# Patient Record
Sex: Female | Born: 1935 | Race: White | Hispanic: No | Smoking: Never smoker
Health system: Southern US, Community
[De-identification: ages and names within clinical notes are randomized; demographics above are authoritative.]

## PROBLEM LIST (undated history)

## (undated) DIAGNOSIS — I358 Other nonrheumatic aortic valve disorders: Secondary | ICD-10-CM

## (undated) DIAGNOSIS — Z780 Asymptomatic menopausal state: Secondary | ICD-10-CM

## (undated) DIAGNOSIS — N3010 Interstitial cystitis (chronic) without hematuria: Secondary | ICD-10-CM

## (undated) DIAGNOSIS — G43109 Migraine with aura, not intractable, without status migrainosus: Secondary | ICD-10-CM

## (undated) DIAGNOSIS — M810 Age-related osteoporosis without current pathological fracture: Secondary | ICD-10-CM

## (undated) DIAGNOSIS — E785 Hyperlipidemia, unspecified: Secondary | ICD-10-CM

## (undated) DIAGNOSIS — K219 Gastro-esophageal reflux disease without esophagitis: Secondary | ICD-10-CM

## (undated) DIAGNOSIS — I10 Essential (primary) hypertension: Secondary | ICD-10-CM

## (undated) DIAGNOSIS — I739 Peripheral vascular disease, unspecified: Secondary | ICD-10-CM

## (undated) HISTORY — DX: Hyperlipidemia, unspecified: E78.5

## (undated) HISTORY — DX: Age-related osteoporosis without current pathological fracture: M81.0

## (undated) HISTORY — DX: Interstitial cystitis (chronic) without hematuria: N30.10

## (undated) HISTORY — DX: Other nonrheumatic aortic valve disorders: I35.8

## (undated) HISTORY — DX: Asymptomatic menopausal state: Z78.0

## (undated) HISTORY — DX: Essential (primary) hypertension: I10

---
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
## 1999-01-15 ENCOUNTER — Emergency Department (HOSPITAL_COMMUNITY): Admission: EM | Admit: 1999-01-15 | Discharge: 1999-01-15 | Payer: Self-pay | Admitting: Emergency Medicine

## 1999-08-17 ENCOUNTER — Inpatient Hospital Stay (HOSPITAL_COMMUNITY): Admission: EM | Admit: 1999-08-17 | Discharge: 1999-08-18 | Payer: Self-pay | Admitting: Emergency Medicine

## 1999-08-17 ENCOUNTER — Encounter: Payer: Self-pay | Admitting: Internal Medicine

## 1999-08-17 ENCOUNTER — Encounter: Payer: Self-pay | Admitting: Emergency Medicine

## 1999-08-21 ENCOUNTER — Ambulatory Visit (HOSPITAL_COMMUNITY): Admission: RE | Admit: 1999-08-21 | Discharge: 1999-08-21 | Payer: Self-pay | Admitting: Internal Medicine

## 1999-08-21 ENCOUNTER — Encounter: Payer: Self-pay | Admitting: Internal Medicine

## 2001-02-20 ENCOUNTER — Encounter: Payer: Self-pay | Admitting: Internal Medicine

## 2001-02-20 ENCOUNTER — Ambulatory Visit (HOSPITAL_COMMUNITY): Admission: RE | Admit: 2001-02-20 | Discharge: 2001-02-20 | Payer: Self-pay | Admitting: Internal Medicine

## 2004-11-23 ENCOUNTER — Ambulatory Visit: Payer: Self-pay | Admitting: Internal Medicine

## 2004-11-30 ENCOUNTER — Ambulatory Visit: Payer: Self-pay | Admitting: Internal Medicine

## 2005-01-08 ENCOUNTER — Ambulatory Visit: Payer: Self-pay | Admitting: Internal Medicine

## 2005-01-23 ENCOUNTER — Ambulatory Visit: Payer: Self-pay | Admitting: Internal Medicine

## 2005-02-27 ENCOUNTER — Ambulatory Visit: Payer: Self-pay | Admitting: Internal Medicine

## 2006-04-10 ENCOUNTER — Ambulatory Visit: Payer: Self-pay | Admitting: Internal Medicine

## 2006-04-16 ENCOUNTER — Ambulatory Visit: Payer: Self-pay | Admitting: Internal Medicine

## 2006-04-22 ENCOUNTER — Ambulatory Visit: Payer: Self-pay

## 2006-06-23 ENCOUNTER — Encounter: Payer: Self-pay | Admitting: Internal Medicine

## 2006-07-21 ENCOUNTER — Ambulatory Visit: Payer: Self-pay | Admitting: Internal Medicine

## 2006-12-18 ENCOUNTER — Ambulatory Visit: Payer: Self-pay | Admitting: Family Medicine

## 2007-04-15 ENCOUNTER — Ambulatory Visit: Payer: Self-pay | Admitting: Internal Medicine

## 2007-04-18 ENCOUNTER — Emergency Department (HOSPITAL_COMMUNITY): Admission: EM | Admit: 2007-04-18 | Discharge: 2007-04-18 | Payer: Self-pay | Admitting: Emergency Medicine

## 2007-06-15 ENCOUNTER — Ambulatory Visit: Payer: Self-pay | Admitting: Internal Medicine

## 2007-06-19 LAB — CONVERTED CEMR LAB
AST: 17 units/L (ref 0–37)
Creatinine, Ser: 0.5 mg/dL (ref 0.4–1.2)
Direct LDL: 115.5 mg/dL
Potassium: 4.5 meq/L (ref 3.5–5.1)
Triglycerides: 84 mg/dL (ref 0–149)

## 2007-06-22 ENCOUNTER — Ambulatory Visit: Payer: Self-pay | Admitting: Internal Medicine

## 2007-07-24 ENCOUNTER — Telehealth (INDEPENDENT_AMBULATORY_CARE_PROVIDER_SITE_OTHER): Payer: Self-pay | Admitting: *Deleted

## 2007-10-07 ENCOUNTER — Telehealth (INDEPENDENT_AMBULATORY_CARE_PROVIDER_SITE_OTHER): Payer: Self-pay | Admitting: *Deleted

## 2007-10-15 ENCOUNTER — Telehealth (INDEPENDENT_AMBULATORY_CARE_PROVIDER_SITE_OTHER): Payer: Self-pay | Admitting: *Deleted

## 2007-11-09 ENCOUNTER — Ambulatory Visit: Payer: Self-pay | Admitting: Internal Medicine

## 2008-01-19 ENCOUNTER — Telehealth (INDEPENDENT_AMBULATORY_CARE_PROVIDER_SITE_OTHER): Payer: Self-pay | Admitting: *Deleted

## 2008-03-11 ENCOUNTER — Telehealth (INDEPENDENT_AMBULATORY_CARE_PROVIDER_SITE_OTHER): Payer: Self-pay | Admitting: *Deleted

## 2008-04-18 ENCOUNTER — Telehealth (INDEPENDENT_AMBULATORY_CARE_PROVIDER_SITE_OTHER): Payer: Self-pay | Admitting: *Deleted

## 2008-05-02 ENCOUNTER — Telehealth (INDEPENDENT_AMBULATORY_CARE_PROVIDER_SITE_OTHER): Payer: Self-pay | Admitting: *Deleted

## 2008-08-04 ENCOUNTER — Telehealth (INDEPENDENT_AMBULATORY_CARE_PROVIDER_SITE_OTHER): Payer: Self-pay | Admitting: *Deleted

## 2008-11-01 ENCOUNTER — Telehealth (INDEPENDENT_AMBULATORY_CARE_PROVIDER_SITE_OTHER): Payer: Self-pay | Admitting: *Deleted

## 2008-11-15 ENCOUNTER — Telehealth (INDEPENDENT_AMBULATORY_CARE_PROVIDER_SITE_OTHER): Payer: Self-pay | Admitting: *Deleted

## 2008-11-23 ENCOUNTER — Ambulatory Visit: Payer: Self-pay | Admitting: Internal Medicine

## 2008-11-23 DIAGNOSIS — R9431 Abnormal electrocardiogram [ECG] [EKG]: Secondary | ICD-10-CM

## 2008-11-23 DIAGNOSIS — E785 Hyperlipidemia, unspecified: Secondary | ICD-10-CM | POA: Insufficient documentation

## 2008-11-23 DIAGNOSIS — M810 Age-related osteoporosis without current pathological fracture: Secondary | ICD-10-CM

## 2008-12-02 ENCOUNTER — Encounter (INDEPENDENT_AMBULATORY_CARE_PROVIDER_SITE_OTHER): Payer: Self-pay | Admitting: *Deleted

## 2008-12-12 ENCOUNTER — Encounter: Payer: Self-pay | Admitting: Internal Medicine

## 2009-01-24 ENCOUNTER — Ambulatory Visit: Payer: Self-pay | Admitting: Internal Medicine

## 2009-01-24 DIAGNOSIS — K219 Gastro-esophageal reflux disease without esophagitis: Secondary | ICD-10-CM | POA: Insufficient documentation

## 2009-01-25 ENCOUNTER — Encounter: Payer: Self-pay | Admitting: Internal Medicine

## 2009-01-25 ENCOUNTER — Telehealth (INDEPENDENT_AMBULATORY_CARE_PROVIDER_SITE_OTHER): Payer: Self-pay | Admitting: *Deleted

## 2009-03-22 ENCOUNTER — Ambulatory Visit: Payer: Self-pay | Admitting: Internal Medicine

## 2009-04-03 ENCOUNTER — Ambulatory Visit: Payer: Self-pay | Admitting: Internal Medicine

## 2009-04-04 ENCOUNTER — Encounter (INDEPENDENT_AMBULATORY_CARE_PROVIDER_SITE_OTHER): Payer: Self-pay | Admitting: *Deleted

## 2009-04-08 ENCOUNTER — Ambulatory Visit: Payer: Self-pay | Admitting: Internal Medicine

## 2009-04-11 ENCOUNTER — Ambulatory Visit: Payer: Self-pay | Admitting: Internal Medicine

## 2009-04-11 DIAGNOSIS — R1319 Other dysphagia: Secondary | ICD-10-CM

## 2009-04-11 LAB — CONVERTED CEMR LAB: Rapid Strep: NEGATIVE

## 2009-04-12 ENCOUNTER — Encounter: Payer: Self-pay | Admitting: Internal Medicine

## 2009-04-12 LAB — CONVERTED CEMR LAB
Basophils Absolute: 0 10*3/uL (ref 0.0–0.1)
Eosinophils Relative: 4.8 % (ref 0.0–5.0)
HCT: 39.2 % (ref 36.0–46.0)
Lymphocytes Relative: 19.2 % (ref 12.0–46.0)
Lymphs Abs: 1.4 10*3/uL (ref 0.7–4.0)
Monocytes Relative: 5.8 % (ref 3.0–12.0)
Neutrophils Relative %: 70.1 % (ref 43.0–77.0)
Platelets: 221 10*3/uL (ref 150.0–400.0)
RDW: 11.9 % (ref 11.5–14.6)
WBC: 7.4 10*3/uL (ref 4.5–10.5)

## 2009-04-13 ENCOUNTER — Encounter (INDEPENDENT_AMBULATORY_CARE_PROVIDER_SITE_OTHER): Payer: Self-pay | Admitting: *Deleted

## 2009-04-27 ENCOUNTER — Ambulatory Visit (HOSPITAL_COMMUNITY): Admission: RE | Admit: 2009-04-27 | Discharge: 2009-04-27 | Payer: Self-pay | Admitting: Obstetrics and Gynecology

## 2009-06-23 ENCOUNTER — Ambulatory Visit: Payer: Self-pay | Admitting: Internal Medicine

## 2009-06-23 LAB — CONVERTED CEMR LAB: Rapid Strep: NEGATIVE

## 2009-08-02 LAB — CONVERTED CEMR LAB: Vit D, 25-Hydroxy: 45 ng/mL (ref 30–89)

## 2009-10-27 ENCOUNTER — Encounter: Payer: Self-pay | Admitting: Internal Medicine

## 2009-12-01 ENCOUNTER — Ambulatory Visit: Payer: Self-pay | Admitting: Internal Medicine

## 2009-12-30 HISTORY — PX: COLONOSCOPY: SHX174

## 2010-01-25 ENCOUNTER — Ambulatory Visit: Payer: Self-pay | Admitting: Internal Medicine

## 2010-01-25 DIAGNOSIS — Z87448 Personal history of other diseases of urinary system: Secondary | ICD-10-CM

## 2010-01-25 LAB — CONVERTED CEMR LAB
Bilirubin Urine: NEGATIVE
Glucose, Urine, Semiquant: NEGATIVE
Ketones, urine, test strip: NEGATIVE
Specific Gravity, Urine: <1.005

## 2010-01-26 ENCOUNTER — Encounter: Payer: Self-pay | Admitting: Internal Medicine

## 2010-04-04 ENCOUNTER — Ambulatory Visit: Payer: Self-pay | Admitting: Internal Medicine

## 2010-04-04 DIAGNOSIS — J309 Allergic rhinitis, unspecified: Secondary | ICD-10-CM | POA: Insufficient documentation

## 2010-04-05 ENCOUNTER — Ambulatory Visit: Payer: Self-pay | Admitting: Diagnostic Radiology

## 2010-04-05 ENCOUNTER — Emergency Department (HOSPITAL_BASED_OUTPATIENT_CLINIC_OR_DEPARTMENT_OTHER): Admission: EM | Admit: 2010-04-05 | Discharge: 2010-04-05 | Payer: Self-pay | Admitting: Emergency Medicine

## 2010-04-05 ENCOUNTER — Telehealth: Payer: Self-pay | Admitting: Internal Medicine

## 2010-04-06 ENCOUNTER — Ambulatory Visit: Payer: Self-pay | Admitting: Internal Medicine

## 2010-04-06 DIAGNOSIS — I10 Essential (primary) hypertension: Secondary | ICD-10-CM

## 2010-04-10 ENCOUNTER — Ambulatory Visit: Payer: Self-pay | Admitting: Internal Medicine

## 2010-04-12 ENCOUNTER — Encounter (INDEPENDENT_AMBULATORY_CARE_PROVIDER_SITE_OTHER): Payer: Self-pay | Admitting: *Deleted

## 2010-04-16 LAB — CONVERTED CEMR LAB
BUN: 7 mg/dL (ref 6–23)
Creatinine, Ser: 0.5 mg/dL (ref 0.4–1.2)
Eosinophils Relative: 4.2 % (ref 0.0–5.0)
Lymphocytes Relative: 21.6 % (ref 12.0–46.0)
Monocytes Absolute: 0.4 10*3/uL (ref 0.1–1.0)
Monocytes Relative: 6.6 % (ref 3.0–12.0)
Neutrophils Relative %: 66.8 % (ref 43.0–77.0)
Platelets: 218 10*3/uL (ref 150.0–400.0)
Potassium: 5.1 meq/L (ref 3.5–5.1)
WBC: 5.9 10*3/uL (ref 4.5–10.5)

## 2010-04-17 ENCOUNTER — Ambulatory Visit: Payer: Self-pay | Admitting: Gastroenterology

## 2010-04-17 ENCOUNTER — Encounter (INDEPENDENT_AMBULATORY_CARE_PROVIDER_SITE_OTHER): Payer: Self-pay | Admitting: *Deleted

## 2010-04-17 DIAGNOSIS — K5909 Other constipation: Secondary | ICD-10-CM

## 2010-04-17 DIAGNOSIS — K5901 Slow transit constipation: Secondary | ICD-10-CM | POA: Insufficient documentation

## 2010-04-17 LAB — CONVERTED CEMR LAB
Ferritin: 50.5 ng/mL (ref 10.0–291.0)
Saturation Ratios: 21.7 % (ref 20.0–50.0)
Vitamin B-12: 714 pg/mL (ref 211–911)

## 2010-04-30 ENCOUNTER — Telehealth: Payer: Self-pay | Admitting: Gastroenterology

## 2010-05-07 ENCOUNTER — Telehealth: Payer: Self-pay | Admitting: Gastroenterology

## 2010-05-07 ENCOUNTER — Ambulatory Visit: Payer: Self-pay | Admitting: Gastroenterology

## 2010-05-10 ENCOUNTER — Telehealth: Payer: Self-pay | Admitting: Gastroenterology

## 2010-06-06 ENCOUNTER — Telehealth (INDEPENDENT_AMBULATORY_CARE_PROVIDER_SITE_OTHER): Payer: Self-pay | Admitting: *Deleted

## 2010-06-24 ENCOUNTER — Inpatient Hospital Stay (HOSPITAL_COMMUNITY): Admission: EM | Admit: 2010-06-24 | Discharge: 2010-06-25 | Payer: Self-pay | Admitting: Emergency Medicine

## 2010-06-24 ENCOUNTER — Ambulatory Visit: Payer: Self-pay | Admitting: Internal Medicine

## 2010-06-25 ENCOUNTER — Encounter: Payer: Self-pay | Admitting: Internal Medicine

## 2010-07-27 ENCOUNTER — Ambulatory Visit: Payer: Self-pay | Admitting: Internal Medicine

## 2010-07-27 DIAGNOSIS — I872 Venous insufficiency (chronic) (peripheral): Secondary | ICD-10-CM

## 2010-07-27 DIAGNOSIS — D649 Anemia, unspecified: Secondary | ICD-10-CM | POA: Insufficient documentation

## 2010-07-31 LAB — CONVERTED CEMR LAB
Basophils Absolute: 0 10*3/uL (ref 0.0–0.1)
HCT: 37.8 % (ref 36.0–46.0)
Hemoglobin: 13.1 g/dL (ref 12.0–15.0)
Lymphs Abs: 1.3 10*3/uL (ref 0.7–4.0)
Monocytes Relative: 6.3 % (ref 3.0–12.0)
Neutro Abs: 3.8 10*3/uL (ref 1.4–7.7)
RDW: 12.8 % (ref 11.5–14.6)
Saturation Ratios: 24.4 % (ref 20.0–50.0)
Transferrin: 237.5 mg/dL (ref 212.0–360.0)

## 2010-12-03 ENCOUNTER — Telehealth (INDEPENDENT_AMBULATORY_CARE_PROVIDER_SITE_OTHER): Payer: Self-pay | Admitting: *Deleted

## 2011-01-20 ENCOUNTER — Encounter: Payer: Self-pay | Admitting: Obstetrics and Gynecology

## 2011-01-27 LAB — CONVERTED CEMR LAB
ALT: 18 units/L (ref 0–35)
Albumin: 3.7 g/dL (ref 3.5–5.2)
Alkaline Phosphatase: 64 units/L (ref 39–117)
Alkaline Phosphatase: 65 units/L (ref 39–117)
BUN: 7 mg/dL (ref 6–23)
Basophils Absolute: 0 10*3/uL (ref 0.0–0.1)
Basophils Absolute: 0 10*3/uL (ref 0.0–0.1)
Bilirubin, Direct: 0.1 mg/dL (ref 0.0–0.3)
CO2: 32 meq/L (ref 19–32)
CO2: 32 meq/L (ref 19–32)
Calcium: 9.1 mg/dL (ref 8.4–10.5)
Chloride: 106 meq/L (ref 96–112)
Cholesterol: 204 mg/dL — ABNORMAL HIGH (ref 0–200)
Eosinophils Absolute: 0.1 10*3/uL (ref 0.0–0.7)
GFR calc non Af Amer: 127.13 mL/min (ref >60)
Glucose, Bld: 102 mg/dL — ABNORMAL HIGH (ref 70–99)
Glucose, Bld: 87 mg/dL (ref 70–99)
HDL: 70.1 mg/dL (ref >39.00)
Hemoglobin: 13 g/dL (ref 12.0–15.0)
Hemoglobin: 13.2 g/dL (ref 12.0–15.0)
Lymphocytes Relative: 23.3 % (ref 12.0–46.0)
Lymphocytes Relative: 23.4 % (ref 12.0–46.0)
Lymphs Abs: 1.1 10*3/uL (ref 0.7–4.0)
MCHC: 34.2 g/dL (ref 30.0–36.0)
MCHC: 34.4 g/dL (ref 30.0–36.0)
Monocytes Relative: 6.8 % (ref 3.0–12.0)
Neutro Abs: 3.1 10*3/uL (ref 1.4–7.7)
Neutro Abs: 3.3 10*3/uL (ref 1.4–7.7)
Neutrophils Relative %: 65.9 % (ref 43.0–77.0)
Platelets: 186 10*3/uL (ref 150.0–400.0)
Platelets: 209 10*3/uL (ref 150–400)
Potassium: 4.4 meq/L (ref 3.5–5.1)
RDW: 12 % (ref 11.5–14.6)
RDW: 12 % (ref 11.5–14.6)
Sodium: 140 meq/L (ref 135–145)
Sodium: 142 meq/L (ref 135–145)
TSH: 0.96 microintl units/mL (ref 0.35–5.50)
Total Bilirubin: 0.7 mg/dL (ref 0.3–1.2)
Total Bilirubin: 0.9 mg/dL (ref 0.3–1.2)
Total Protein: 6.2 g/dL (ref 6.0–8.3)
Triglycerides: 64 mg/dL (ref 0.0–149.0)
Vit D, 1,25-Dihydroxy: 31 (ref 30–89)
Vit D, 25-Hydroxy: 39 ng/mL (ref 30–89)

## 2011-01-29 NOTE — Progress Notes (Signed)
Summary: prep ?'s  Phone Note Call from Patient Call back at Home Phone [phone]   Caller: Patient Call For: Dr. Jarold Motto Reason for Call: Talk to Nurse Summary of Call: COL prep questions Initial call taken by: Vallarie Mare,  Apr 30, 2010 10:08 AM  Follow-up for Phone Call        Answered pt's questions re prep and colonoscopy. Follow-up by: Ashok Cordia RN,  Apr 30, 2010 10:22 AM

## 2011-01-29 NOTE — Progress Notes (Signed)
Summary: Phone-bp elevated//  FYI ED  Phone Note Call from Patient Call back at Home Phone [phone]   Caller: Patient Summary of Call: Patient states that her bp is 204/92. Patient is wantimg know what to do. She also states her face is flushed. She has an appt on today. Please advise Initial call taken by: Barb Merino,  April 05, 2010 12:36 PM  Follow-up for Phone Call        spoke with pt who says she has tingling in feet denies chst pain.  [place] fire [place] and had BP checked which was 204/92,  recommend [facility] to [facility] for [facility] assessment pt agreed. Cancelled appt .Kandice Hams  April 05, 2010 1:23 PM  Follow-up by: Marga Melnick MD,  April 05, 2010 6:24 PM  Additional Follow-up for Phone Call Additional follow up Details #1::        noted; BP 04/06 was in context of having thought she had lost her purse9she subsequently found it @ home) Additional Follow-up by: Marga Melnick MD,  April 05, 2010 6:25 PM

## 2011-01-29 NOTE — Assessment & Plan Note (Signed)
Summary: eyes itch and hurt/kdc   Vital Signs:  Patient profile:   77 year old female Weight:      138.4 pounds Temp:     98.0 degrees F oral Pulse rate:   76 / minute Resp:     14 per minute BP sitting:   150 / 84  (left arm) Cuff size:   regular  Vitals Entered By: Shonna Chock (April 04, 2010 3:10 PM) CC: Eyes very itchy and irritated x 1 week (better today). Patient tried Allegra and systaine eye drops. Comments REVIEWED MED LIST, PATIENT AGREED DOSE AND INSTRUCTION CORRECT    CC:  Eyes very itchy and irritated x 1 week (better today). Patient tried Allegra and systaine eye drops..  History of Present Illness: Itchy eyes X 5 days after pollen exposure @ outdoor birthday party. This is  recurrent every Spring. Allegra didn't help ; but she had some improvement today.  Allergies: 1)  ! Metronidazole 2)  ! Biaxin 3)  Penicillin G Potassium (Penicillin G Potassium) 4)  Phenergan (Promethazine Hcl) 5)  Fosamax (Alendronate Sodium) 6)  * Guaifenesin Group  Review of Systems General:  Denies chills, fever, and sweats. ENT:  Complains of nasal congestion and sinus pressure; No purulence. Resp:  Denies sputum productive and wheezing; Minor NP cough. Allergy:  Complains of seasonal allergies and sneezing; No angioedema.  Physical Exam  General:  Thin,in no acute distress; alert,appropriate and cooperative throughout examination Eyes:  No corneal or conjunctival inflammation noted.  Ears:  External ear exam shows no significant lesions or deformities.  Otoscopic examination reveals clear canals, tympanic membranes are intact bilaterally without bulging, retraction, inflammation or discharge. Hearing is grossly normal bilaterally. Nose:  External nasal examination shows no deformity or inflammation. Nasal mucosa : mild edema R nare without lesions or exudates. Mouth:  Oral mucosa and oropharynx without lesions or exudates.  Teeth in good repair. Lungs:  Normal respiratory effort,  chest expands symmetrically. Lungs are clear to auscultation, no crackles or wheezes. 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 1 at bedtime as needed  Complete Medication List: 1)  Toprol Xl 25 Mg Xr24h-tab (Metoprolol succinate) .Marland Kitchen.. 1 by mouth once daily 2)  Amitriptyline Hcl 10 Mg Tabs (Amitriptyline hcl) .Marland Kitchen.. 1 qhs 3)  Clindamycin Hcl Caps (Clindamycin hcl caps) .... As needed as sbe for the dentist 4)  Multivitamin  5)  Baby Asa  .Marland Kitchen.. 1 by mouth qd 6)  Caltrate With Vit D  .... 1 daily 7)  Glucosamine Hcl 1000 Mg Tabs (Glucosamine hcl) .Marland Kitchen.. 1 by mouth once daily 8)  Ranitidine Hcl 150 Mg Tabs (Ranitidine hcl) .Marland Kitchen.. 1 q 12 hrs pre meal as needed for dyspepsia 9)  Optivar 0.05 % Soln (Azelastine hcl) .... I drop two times a day as needed into affected eye 10)  Phenazopyridine Hcl 100 Mg Tabs (Phenazopyridine hcl) .Marland Kitchen.. 1 as needed bladder pain 11)  Singulair 10 Mg Tabs (Montelukast sodium) .Marland Kitchen.. 1 once daily as needed for allergies  Patient Instructions: 1)  Optivar 1 drop two times a day . Trial of Singulair 10 mg once daily for allergies. You may add Loratidine 10 mg( Claritin OTC)  once daily as needed in place of  Allegra if that is ineffective.  2)  Check your Blood Pressure regularly. If it is above:140/90 ON AVERAGE you should make an appointment. Prescriptions: SINGULAIR 10 MG TABS (MONTELUKAST SODIUM) 1 once daily as needed for allergies  #30 x 11   Entered and Authorized by:   Marga Melnick MD   Signed by:   Marga Melnick MD on 04/04/2010   Method used:   Print then Give to Patient   RxID:   [id number]

## 2011-01-29 NOTE — Progress Notes (Signed)
Summary: refill  Phone Note Refill Request   Refills Requested: Medication #1:  AMLODIPINE BESYLATE 2.5 MG TABS 1 once daily patien needs new rx ro mail to mail order -   90 day supply - patient will pick up call when ready   Method Requested: Pick up at Office Initial call taken by: Okey Regal [place],  June 06, 2010 1:43 PM  Follow-up for Phone Call        Left message on VM informing patient rx is ready for pick-up Follow-up by: Shonna Chock,  June 06, 2010 2:37 PM    Prescriptions: AMLODIPINE BESYLATE 2.5 MG TABS (AMLODIPINE BESYLATE) 1 once daily  #90 x 1   Entered by:   Shonna Chock   Authorized by:   Marga Melnick MD   Signed by:   Shonna Chock on 06/06/2010   Method used:   Print then Give to Patient   RxID:   [id number]

## 2011-01-29 NOTE — Letter (Signed)
Summary: New Patient letter  [facility]  [address] [place], [place] [address]   Phone: [phone]  Fax: [phone]       04/12/2010 MRN: [id number]  [facility] [address] [place], [place]  [address]  Dear Ms. Borger,  Welcome to the Gastroenterology Division at [facility].    You are scheduled to see Dr.  Sheryn Bison on April 17, 2010 at 9:00am on the 3rd floor at [facility], [address]  We ask that you try to arrive at our office 15 minutes prior to your appointment time to allow for check-in.  We would like you to complete the enclosed self-administered evaluation form prior to your visit and bring it with you on the day of your appointment.  We will review it with you.  Also, please bring a complete list of all your medications or, if you prefer, bring the medication bottles and we will list them.  Please bring your insurance card so that we may make a copy of it.  If your insurance requires a referral to see a specialist, please bring your referral form from your primary care physician.  Co-payments are due at the time of your visit and may be paid by cash, check or credit card.     Your office visit will consist of a consult with your physician (includes a physical exam), any laboratory testing he/she may order, scheduling of any necessary diagnostic testing (e.g. x-ray, ultrasound, CT-scan), and scheduling of a procedure (e.g. Endoscopy, Colonoscopy) if required.  Please allow enough time on your schedule to allow for any/all of these possibilities.    If you cannot keep your appointment, please call [phone] to cancel or reschedule prior to your appointment date.  This allows [place] the opportunity to schedule an appointment for another patient in need of care.  If you do not cancel or reschedule by 5 p.m. the business day prior to your appointment date, you will be charged a $50.00 late cancellation/no-show fee.    Thank you for  choosing [facility] for your medical needs.  We appreciate the opportunity to care for you.  Please visit [place] at our website  to learn more about our practice.                     Sincerely,                                                             The Gastroenterology Division

## 2011-01-29 NOTE — Progress Notes (Signed)
Summary: constipation  Phone Note Call from Patient Call back at Home Phone [phone]   Caller: Patient Call For: Dr. Jarold Motto Reason for Call: Talk to Nurse Summary of Call: pt had COL last Monday and since then has had two normal BM's... today pt has been constipated Initial call taken by: Vallarie Mare,  May 10, 2010 4:41 PM  Follow-up for Phone Call        Pt had colonoscopy on Monday 05/07/10.  Had normal BM on Tues and Wed but has not had BM today.  Pt asks if she sould be concerned.  No other symptoms.  It reassurred that this is not unusual.  Pt asks if OK to drink prune juice.  Informed this will be  OK and to report back if has more trouble. Follow-up by: Ashok Cordia RN,  May 10, 2010 5:02 PM

## 2011-01-29 NOTE — Progress Notes (Signed)
Summary: Refill Request  Phone Note Refill Request Call back at Home Phone [phone] Message from:  Patient  Refills Requested: Medication #1:  AMLODIPINE BESYLATE 2.5 MG TABS 1 once daily [facility]   Method Requested: Pick up at Office Initial call taken by: Shonna Chock CMA,  December 03, 2010 9:32 AM  Follow-up for Phone Call        Left message informing patient rx is avaliable for pick-up Follow-up by: Shonna Chock CMA,  December 03, 2010 11:45 AM    Prescriptions: AMLODIPINE BESYLATE 2.5 MG TABS (AMLODIPINE BESYLATE) 1 once daily  #90 x 2   Entered by:   Shonna Chock CMA   Authorized by:   Marga Melnick MD   Signed by:   Shonna Chock CMA on 12/03/2010   Method used:   Print then Give to Patient   RxID:   [id number]

## 2011-01-29 NOTE — Assessment & Plan Note (Signed)
Summary: LIGHT PINK BLOOD WHEN WIPING/ALR   Vital Signs:  Patient profile:   77 year old female Weight:      138 pounds Temp:     98.9 degrees F oral Pulse rate:   80 / minute Resp:     17 per minute BP sitting:   120 / 80  (left arm) Cuff size:   regular  Vitals Entered By: Shonna Chock (April 10, 2010 10:46 AM) CC: Pinkish colored blood with Bowel Movements. Patient said at one time there was a clear jelly looking something that she noticed also with BM.  Comments REVIEWED MED LIST, PATIENT AGREED DOSE AND INSTRUCTION CORRECT    CC:  Pinkish colored blood with Bowel Movements. Patient said at one time there was a clear jelly looking something that she noticed also with BM. Marland Kitchen  History of Present Illness: BP @ FD was 162/90 yesterday. Her cuff is broken.Repeat BP 200(faintly ) /140/95. Bowel changes as of 04/07/2010 as constipation followed by pink color on tissue & "straw " in stool  after using prunes, prune juice , apple sauce & roughage.Last Bm this am was soft. Colonoscopy SOC agian reviewed. No PMH or FH of GI disease.HCT 39.2 in 03/2009.  Allergies: 1)  ! Metronidazole 2)  ! Biaxin 3)  Penicillin G Potassium (Penicillin G Potassium) 4)  Phenergan (Promethazine Hcl) 5)  Fosamax (Alendronate Sodium) 6)  * Guaifenesin Group  Review of Systems GI:  Complains of constipation and gas; denies bloody stools, dark tarry stools, nausea, and vomiting.  Physical Exam  General:  well-nourished,in no acute distress; alert,appropriate and cooperative throughout examination Lungs:  Normal respiratory effort, chest expands symmetrically. Lungs are clear to auscultation, no crackles or wheezes. Heart:  Normal rate and regular rhythm. S1 and S2 normal without gallop, murmur, click, rub.S4 Abdomen:  Bowel sounds positive,abdomen soft and non-tender without masses, organomegaly or hernias noted. No AAA or renal artery bruits Skin:  Intact without suspicious lesions or rashes   Impression  & Recommendations:  Problem # 1:  HEMORRHAGE OF RECTUM AND ANUS (ICD-569.3)  "pink " discoloration on tissue incontext of constipation  Orders: Venipuncture (84132) TLB-CBC Platelet - w/Differential (85025-CBCD) Gastroenterology Referral (GI)  Problem # 2:  HYPERTENSION (ICD-401.9)  Her updated medication list for this problem includes:    Toprol Xl 25 Mg Xr24h-tab (Metoprolol succinate) .Marland Kitchen... 1 by mouth once daily    Amlodipine Besylate 2.5 Mg Tabs (Amlodipine besylate) .Marland Kitchen... 1 once daily  Orders: Venipuncture (44010) TLB-Creatinine, Blood (82565-CREA) TLB-Potassium (K+) (84132-K) TLB-BUN (Urea Nitrogen) (84520-BUN) Prescription Created Electronically [phone])  Complete Medication List: 1)  Toprol Xl 25 Mg Xr24h-tab (Metoprolol succinate) .Marland Kitchen.. 1 by mouth once daily 2)  Amitriptyline Hcl 10 Mg Tabs (Amitriptyline hcl) .Marland Kitchen.. 1 qhs 3)  Clindamycin Hcl Caps (Clindamycin hcl caps) .... As needed as sbe for the dentist 4)  Multivitamin  5)  Baby Asa  .Marland Kitchen.. 1 by mouth qd 6)  Caltrate With Vit D  .... 1 daily 7)  Glucosamine Hcl 1000 Mg Tabs (Glucosamine hcl) .Marland Kitchen.. 1 by mouth once daily 8)  Ranitidine Hcl 150 Mg Tabs (Ranitidine hcl) .Marland Kitchen.. 1 q 12 hrs pre meal as needed for dyspepsia 9)  Optivar 0.05 % Soln (Azelastine hcl) .... I drop two times a day as needed into affected eye 10)  Phenazopyridine Hcl 100 Mg Tabs (Phenazopyridine hcl) .Marland Kitchen.. 1 as needed bladder pain 11)  Singulair 10 Mg Tabs (Montelukast sodium) .Marland Kitchen.. 1 once daily as needed for allergies 12)  Amlodipine Besylate 2.5 Mg Tabs (Amlodipine besylate) .Marland Kitchen.. 1 once daily  Patient Instructions: 1)  Check your Blood Pressure regularly.Your goal = AVERAGE < 140/90. Prescriptions: AMLODIPINE BESYLATE 2.5 MG TABS (AMLODIPINE BESYLATE) 1 once daily  #30 x 5   Entered and Authorized by:   Marga Melnick MD   Signed by:   Marga Melnick MD on 04/10/2010   Method used:   Faxed to ...       [facility] [address] (retail)       [address]       [place], [place]  [address]       Ph: [phone] or [phone]       Fax: [phone]   RxID:   [id number]

## 2011-01-29 NOTE — Progress Notes (Signed)
Summary: Triage  Phone Note Call from Patient Call back at Home Phone [phone]   Caller: Patient Call For: Dr. Jarold Motto Reason for Call: Talk to Nurse Summary of Call: Pt called Dr. Christella Hartigan over the weekend...her rectum is "raw" from diarrhea and was told to apply Preparation H--continues to have diarrhea this morning. Initial call taken by: Karna Christmas,  May 07, 2010 8:41 AM  Follow-up for Phone Call        Pt called due to a raw anus. States specs of blood noted.  Did speak to Dr. Christella Hartigan last evening and he told her to use prep H.  I told her to use more prep H , and to come in for her 11am colonoscopy. Follow-up by: Clide Cliff RN,  May 07, 2010 8:48 AM

## 2011-01-29 NOTE — Letter (Signed)
Summary: [facility] Instructions  [facility]  [address] [place], [place] [address]   Phone: [phone]  Fax: [phone]       Alicia Boyd    May 25, 1930    MRN: [id number]        Procedure Day /Date: Monday,  05/07/10     Arrival Time:10:00      Procedure Time: 11:00     Location of Procedure:                    Alicia Boyd  [facility] (4th Floor)                        PREPARATION FOR COLONOSCOPY WITH MOVIPREP   Starting 5 days prior to your procedure 05/02/10 do not eat nuts, seeds, popcorn, corn, beans, peas,  salads, or any raw vegetables.  Do not take any fiber supplements (e.g. Metamucil, Citrucel, and Benefiber).  THE DAY BEFORE YOUR PROCEDURE         DATE: 05/06/10  DAY: Sunday  1.  Drink clear liquids the entire day-NO SOLID FOOD  2.  Do not drink anything colored red or purple.  Avoid juices with pulp.  No orange juice.  3.  Drink at least 64 oz. (8 glasses) of fluid/clear liquids during the day to prevent dehydration and help the prep work efficiently.  CLEAR LIQUIDS INCLUDE: Water Jello Ice Popsicles Tea (sugar ok, no milk/cream) Powdered fruit flavored drinks Coffee (sugar ok, no milk/cream) Gatorade Juice: apple, white grape, white cranberry  Lemonade Clear bullion, consomm, broth Carbonated beverages (any kind) Strained chicken noodle soup Hard Candy                             4.  In the morning, mix first dose of MoviPrep solution:    Empty 1 Pouch A and 1 Pouch B into the disposable container    Add lukewarm drinking water to the top line of the container. Mix to dissolve    Refrigerate (mixed solution should be used within 24 hrs)  5.  Begin drinking the prep at 5:00 p.m. The MoviPrep container is divided by 4 marks.   Every 15 minutes drink the solution down to the next mark (approximately 8 oz) until the full liter is complete.   6.  Follow completed prep with 16 oz of clear liquid of your choice (Nothing red or  purple).  Continue to drink clear liquids until bedtime.  7.  Before going to bed, mix second dose of MoviPrep solution:    Empty 1 Pouch A and 1 Pouch B into the disposable container    Add lukewarm drinking water to the top line of the container. Mix to dissolve    Refrigerate  THE DAY OF YOUR PROCEDURE      DATE: 05/07/10   DAY: Monday  Beginning at 6:00 a.m. (5 hours before procedure):         1. Every 15 minutes, drink the solution down to the next mark (approx 8 oz) until the full liter is complete.  2. Follow completed prep with 16 oz. of clear liquid of your choice.    3. You may drink clear liquids until 9:00  (2 HOURS BEFORE PROCEDURE).   MEDICATION INSTRUCTIONS  Unless otherwise instructed, you should take regular prescription medications with a small sip of water   as early as possible the morning  of your procedure.                  OTHER INSTRUCTIONS  You will need a responsible adult at least 75 years of age to accompany you and drive you home.   This person must remain in the waiting room during your procedure.  Wear loose fitting clothing that is easily removed.  Leave jewelry and other valuables at home.  However, you may wish to bring a book to read or  an iPod/MP3 player to listen to music as you wait for your procedure to start.  Remove all body piercing jewelry and leave at home.  Total time from sign-in until discharge is approximately 2-3 hours.  You should go home directly after your procedure and rest.  You can resume normal activities the  day after your procedure.  The day of your procedure you should not:   Drive   Make legal decisions   Operate machinery   Drink alcohol   Return to work  You will receive specific instructions about eating, activities and medications before you leave.    The above instructions have been reviewed and explained to me by   _______________________    I fully understand and can verbalize  these instructions _____________________________ Date _________

## 2011-01-29 NOTE — Assessment & Plan Note (Signed)
Summary: ? bladder infection- jr   Vital Signs:  Patient profile:   77 year old female Weight:      137.6 pounds Temp:     98.6 degrees F oral Pulse rate:   64 / minute Resp:     17 per minute BP sitting:   130 / 82  (left arm) Cuff size:   regular  Vitals Entered By: Shonna Chock (January 25, 2010 12:17 PM) CC: 1.) ? Bladder Infection: When patient finishes urinating she experience pain. Patient with discomfort in abdominal area. About 3 months ago patient had fluid as if she had wet herself but she didnt-? what/where the fluid came from. 2.) Patient also with leg aches at night Comments REVIEWED MED LIST, PATIENT AGREED DOSE AND INSTRUCTION CORRECT    CC:  1.) ? Bladder Infection: When patient finishes urinating she experience pain. Patient with discomfort in abdominal area. About 3 months ago patient had fluid as if she had wet herself but she didnt-? what/where the fluid came from. 2.) Patient also with leg aches at night.  History of Present Illness:  For 1 month  intermittently AFTER urinating  she has  urethral aching for 20-30 minute; no other GU symptoms. No definite trigger such as spicey food; she does not drink.No Rx except decreased caffeine intake , ? benefit.   Allergies: 1)  ! Metronidazole 2)  ! Biaxin 3)  Penicillin G Potassium (Penicillin G Potassium) 4)  Phenergan (Promethazine Hcl) 5)  Fosamax (Alendronate Sodium) 6)  * Guaifenesin Group  Review of Systems General:  Denies chills, fever, sweats, and weight loss. GU:  Complains of nocturia; denies abnormal vaginal bleeding, discharge, hematuria, incontinence, urinary frequency, and urinary hesitancy; Nocturia X 2. Dr Elana Alm last seen 03/2009.Marland Kitchen  Physical Exam  General:  Thin,well-nourished,in no acute distress; alert,appropriate and cooperative throughout examination Abdomen:  Bowel sounds positive,abdomen soft and non-tender without masses, organomegaly or hernias noted. Msk:  No flank tenderness Skin:   Intact without suspicious lesions or rashes Cervical Nodes:  No lymphadenopathy noted Axillary Nodes:  No palpable lymphadenopathy   Impression & Recommendations:  Problem # 1:  PERSONAL HISTORY OTHER DISORDER URINARY SYSTEM (ICD-V13.09)  urethral pain after urination  Orders: T-Culture, Urine ([id number]) Prescription Created Electronically [phone])  Complete Medication List: 1)  Toprol Xl 25 Mg Xr24h-tab (Metoprolol succinate) .Marland Kitchen.. 1 by mouth once daily 2)  Amitriptyline Hcl 10 Mg Tabs (Amitriptyline hcl) .Marland Kitchen.. 1 qhs 3)  Clindamycin Hcl Caps (Clindamycin hcl caps) .... As needed as sbe for the dentist 4)  Multivitamin  5)  Baby Asa  .Marland Kitchen.. 1 by mouth qd 6)  Caltrate With Vit D  .... 1 daily 7)  Glucosamine Hcl 1000 Mg Tabs (Glucosamine hcl) .Marland Kitchen.. 1 by mouth once daily 8)  Ranitidine Hcl 150 Mg Tabs (Ranitidine hcl) .Marland Kitchen.. 1 q 12 hrs pre meal as needed for dyspepsia 9)  Xyzal 5 Mg Tabs (Levocetirizine dihydrochloride) .Marland Kitchen.. 1 at bedtime as needed 10)  Optivar 0.05 % Soln (Azelastine hcl) .... I drop two times a day as needed into affected eye 11)  Phenazopyridine Hcl 100 Mg Tabs (Phenazopyridine hcl) .Marland Kitchen.. 1 as needed bladder pain  Other Orders: UA Dipstick w/o Micro (manual) (91478)  Patient Instructions: 1)  Keep Diary of possible triggers for pain. Prescriptions: PHENAZOPYRIDINE HCL 100 MG TABS (PHENAZOPYRIDINE HCL) 1 as needed bladder pain  #15 x 0   Entered and Authorized by:   Marga Melnick MD   Signed by:   Marga Melnick MD  on 01/25/2010   Method used:   Faxed to ...       [facility] [phone][facility] (retail)       [address]       [place], [place]  [address]       Ph: [phone] or [phone]       Fax: [phone]   RxID:   [id number]   Laboratory Results   Urine Tests    Routine Urinalysis   Color: lt. yellow Appearance: Cloudy Glucose: negative   (Normal Range: Negative) Bilirubin: negative   (Normal Range: Negative) Ketone: negative   (Normal Range:  Negative) Spec. Gravity: <1.005   (Normal Range: 1.003-1.035) Blood: negative   (Normal Range: Negative) pH: 8.0   (Normal Range: 5.0-8.0) Protein: negative   (Normal Range: Negative) Urobilinogen: 0.2   (Normal Range: 0-1) Nitrite: negative   (Normal Range: Negative) Leukocyte Esterace: negative   (Normal Range: Negative)

## 2011-01-29 NOTE — Assessment & Plan Note (Signed)
Summary: RECTAL AND ANUS HEMMORAGE...EM   History of Present Illness Visit Type: Initial Consult Primary GI MD: Sheryn Bison MD FACP FAGA Primary Provider: Marga Melnick, MD Requesting Provider: Marga Melnick, MD Chief Complaint: One time rectal bleed with severe constipation History of Present Illness:   Extremely pleasant 77 year old Caucasian female who presents with a symptomatic rectal bleeding 2 weeks ago. This was associated with constipation and straining but no rectal or nominal plain. She currently is asymptomatic and is back on her prunes and fiber supplements and is having normal formed bowel movements. She has had problems with rectal emptying for many years but has not had barium studies or colonoscopy. She has previously been seen by Dr. Elana Alm for gynecologic exam and apparently had a negative pelvic CT scan one year ago. Her family history is noncontributory. She denies abdominal pain, upper gastrointestinal or hepatobiliary complaints. She denies any food intolerances.  She does have mild essential hypertension and  osteoporosis. Her medications are listed and reviewed.   GI Review of Systems    Reports bloating.      Denies abdominal pain, acid reflux, belching, chest pain, dysphagia with liquids, dysphagia with solids, heartburn, loss of appetite, nausea, vomiting, vomiting blood, weight loss, and  weight gain.      Reports constipation.     Denies anal fissure, black tarry stools, change in bowel habit, diarrhea, diverticulosis, fecal incontinence, heme positive stool, hemorrhoids, irritable bowel syndrome, jaundice, light color stool, liver problems, rectal bleeding, and  rectal pain.    Current Medications (verified): 1)  Toprol Xl 25 Mg  Xr24h-Tab (Metoprolol Succinate) .Marland Kitchen.. 1 By Mouth Once Daily 2)  Amitriptyline Hcl 10 Mg  Tabs (Amitriptyline Hcl) .Marland Kitchen.. 1 Qhs 3)  Clindamycin Hcl   Caps (Clindamycin Hcl Caps) .... As Needed As Sbe For The Dentist 4)   Multivitamin 5)  Baby Asa .Marland Kitchen.. 1 By Mouth Qd 6)  Caltrate With Vit D .... 1 Daily 7)  Ranitidine Hcl 150 Mg Tabs (Ranitidine Hcl) .Marland Kitchen.. 1 Q 12 Hrs Pre Meal As Needed For Dyspepsia 8)  Optivar 0.05 % Soln (Azelastine Hcl) .... I Drop Two Times A Day As Needed Into Affected Eye 9)  Phenazopyridine Hcl 100 Mg Tabs (Phenazopyridine Hcl) .Marland Kitchen.. 1 As Needed Bladder Pain 10)  Allegra 180 Mg Tabs (Fexofenadine Hcl) .... Once Daily As Needed 11)  Amlodipine Besylate 2.5 Mg Tabs (Amlodipine Besylate) .Marland Kitchen.. 1 Once Daily  Allergies (verified): 1)  ! Metronidazole 2)  ! Biaxin 3)  Penicillin G Potassium (Penicillin G Potassium) 4)  Phenergan (Promethazine Hcl) 5)  Fosamax (Alendronate Sodium) 6)  * Guaifenesin Group  Past History:  Past medical, surgical, family and social histories (including risk factors) reviewed for relevance to current acute and chronic problems.  Past Medical History: postmenopausal, no PMH of HRT AS/AI on 2DECHO 2002 Interstitial cystitis, resolved ; 2000 chest pain, negative  cardiac evaluation; Flagyl reaction as  sore throat & difficulty swallowing ,seen in ER 04/17/07 Osteoporosis, GI intolerance to  Fosamax,Actonel  & Boniva ,T score -4.7  (Note: Reclast declined for fear of potential drug reaction) Hypertension Urinary Tract Infection  Past Surgical History: Reviewed history from 12/01/2009 and no changes required. Denies surgical history; G1 P1 Colonoscopy is being considered  Family History: Reviewed history from 12/01/2009 and no changes required. Father:pancreatitic insufficiency post MVA resulting in  DM Mother: ? HTN,osteoporosis,thyroid disease Siblings: sister d 22 CVA, COAD ; no MI; longevity among grandparents No FH of Colon Cancer:  Social History: Reviewed  history from 12/01/2009 and no changes required. Never Smoked Married Alcohol use-no Regular exercise-yes: walks 1 mpd most days  Occupation: Retired  Review of Systems       The patient  complains of allergy/sinus and urination changes/pain.  The patient denies anemia, anxiety-new, arthritis/joint pain, back pain, blood in urine, breast changes/lumps, change in vision, confusion, cough, coughing up blood, depression-new, fainting, fatigue, fever, headaches-new, hearing problems, heart murmur, heart rhythm changes, itching, menstrual pain, muscle pains/cramps, night sweats, nosebleeds, pregnancy symptoms, shortness of breath, skin rash, sleeping problems, sore throat, swelling of feet/legs, swollen lymph glands, thirst - excessive , urination - excessive , urine leakage, vision changes, and voice change.   General:  Denies fever, chills, sweats, anorexia, fatigue, weakness, malaise, weight loss, and sleep disorder. CV:  Denies chest pains, angina, palpitations, syncope, dyspnea on exertion, orthopnea, PND, peripheral edema, and claudication. Resp:  Denies dyspnea at rest, dyspnea with exercise, cough, sputum, wheezing, coughing up blood, and pleurisy. GI:  Complains of constipation; denies difficulty swallowing, pain on swallowing, nausea, indigestion/heartburn, vomiting, vomiting blood, abdominal pain, jaundice, gas/bloating, diarrhea, change in bowel habits, bloody BM's, black BMs, and fecal incontinence. GU:  Complains of urinary frequency; denies urinary burning, blood in urine, nocturnal urination, urinary incontinence, abnormal vaginal bleeding, amenorrhea, menorrhagia, vaginal discharge, pelvic pain, genital sores, painful intercourse, and decreased libido. MS:  Denies joint pain / LOM, joint swelling, joint stiffness, joint deformity, low back pain, muscle weakness, muscle cramps, muscle atrophy, leg pain at night, leg pain with exertion, and shoulder pain / LOM hand / wrist pain (CTS). Neuro:  Denies weakness, paralysis, abnormal sensation, seizures, syncope, tremors, vertigo, transient blindness, frequent falls, frequent headaches, difficulty walking, headache, sciatica,  radiculopathy other:, restless legs, memory loss, and confusion. Psych:  Denies depression, anxiety, memory loss, suicidal ideation, hallucinations, paranoia, phobia, and confusion. Heme:  Denies bruising, bleeding, enlarged lymph nodes, and pagophagia.  Vital Signs:  Patient profile:   77 year old female Height:      63.5 inches Weight:      138 pounds BMI:     24.15 Pulse rate:   100 / minute Pulse rhythm:   regular BP sitting:   166 / 90  (left arm) Cuff size:   regular  Vitals Entered By: June McMurray CMA Duncan Dull) (April 17, 2010 9:02 AM)  Physical Exam  General:  Well developed, well nourished, no acute distress.healthy appearing.   Head:  Normocephalic and atraumatic. Eyes:  PERRLA, no icterus.exam deferred to patient's ophthalmologist.   Neck:  Supple; no masses or thyromegaly. Lungs:  Clear throughout to auscultation. Heart:  Regular rate and rhythm; no murmurs, rubs,  or bruits. Abdomen:  Soft, nontender and nondistended. No masses, hepatosplenomegaly or hernias noted. Normal bowel sounds. Rectal:  Normal exam.hemocult negative.   Msk:  Symmetrical with no gross deformities. Normal posture. Pulses:  Normal pulses noted. Extremities:  No clubbing, cyanosis, edema or deformities noted. Neurologic:  Alert and  oriented x4;  grossly normal neurologically. Skin:  Intact without significant lesions or rashes. Cervical Nodes:  No significant cervical adenopathy. Psych:  Alert and cooperative. Normal mood and affect.   Impression & Recommendations:  Problem # 1:  HEMORRHAGE OF RECTUM AND ANUS (ICD-569.3) Assessment Improved Probable hemorrhoidal bleeding associated with constipation. Her CBC was reviewed and is normal. I will check anemia profile and schedule diagnostic colonoscopy. She is to continue high fiber diet- liberal p.o. fluids as tolerated. Orders: TLB-B12, Serum-Total ONLY ([address]-U04) TLB-Ferritin (82728-FER) TLB-Folic Acid (Folate) (82746-FOL) TLB-IBC  Pnl  (Iron/FE;Transferrin) (83550-IBC)  Problem # 2:  OTHER CONSTIPATION (ICD-564.09) Assessment: Improved Fiber supplements suggested as needed.She denies problems at this time as long as she follows her regular dietary regime. Orders: TLB-B12, Serum-Total ONLY ([address]-U04) TLB-Ferritin (82728-FER) TLB-Folic Acid (Folate) (82746-FOL) TLB-IBC Pnl (Iron/FE;Transferrin) (83550-IBC)  Problem # 3:  HYPERTENSION (ICD-401.9) Assessment: Improved blood pressure today normal at 166/90, mildly elevated. The values will be sent to Dr. Alwyn Ren for review. She denies continue all of her antihypertensive medication throughout the preparation procedure for colonoscopy and on the day of exam.  Patient Instructions: 1)  Please go to the basement for lab work.   2)  You are scheduled for a colonoscopy. 3)  The medication list was reviewed and reconciled.  All changed / newly prescribed medications were explained.  A complete medication list was provided to the patient / caregiver. 4)  High Fiber, Low Fat  Healthy Eating Plan brochure given.  5)  Constipation and Hemorrhoids brochure given.  6)  Colonoscopy and Flexible Sigmoidoscopy brochure given.  7)  Conscious Sedation brochure given.  8)  Copy sent to : Dr. Marga Melnick  Appended Document: RECTAL AND ANUS HEMMORAGE...EM    Clinical Lists Changes  Medications: Added new medication of MOVIPREP 100 GM  SOLR (PEG-KCL-NACL-NASULF-NA ASC-C) As per prep instructions. - Signed Rx of MOVIPREP 100 GM  SOLR (PEG-KCL-NACL-NASULF-NA ASC-C) As per prep instructions.;  #1 x 0;  Signed;  Entered by: Ashok Cordia RN;  Authorized by: Mardella Layman MD [facility];  Method used: [facility] [phone]*, [address] [place], [place]  [address], Ph: [phone] or [phone], Fax: [phone] Orders: Added new Test order of Colonoscopy (Colon) - Signed    Prescriptions: MOVIPREP 100 GM  SOLR (PEG-KCL-NACL-NASULF-NA ASC-C) As per prep instructions.  #1  x 0   Entered by:   Ashok Cordia RN   Authorized by:   Mardella Layman MD [facility]   Signed by:   Ashok Cordia RN on 04/17/2010   Method used:   [facility] to        [facility] [phone][facility] (retail)       [address]       [place], [place]  [address]       Ph: [phone] or [phone]       Fax: [phone]   RxID:   [id number]

## 2011-01-29 NOTE — Assessment & Plan Note (Signed)
Summary: fu on from ed/kdc ok per alida   Vital Signs:  Patient profile:   77 year old female Weight:      138 pounds Temp:     98.5 degrees F oral Pulse rate:   76 / minute Resp:     15 per minute BP sitting:   158 / 84  (left arm) Cuff size:   regular  Vitals Entered By: Shonna Chock (April 06, 2010 11:59 AM) CC: Emergency room follow-up: Patient's b/p was elevated and faced very flushed, Hypertension Management Comments REVIEWED MED LIST, PATIENT AGREED DOSE AND INSTRUCTION CORRECT    CC:  Emergency room follow-up: Patient's b/p was elevated and faced very flushed and Hypertension Management.  History of Present Illness: On Toprol XL 25 mg once daily X 2 days ; she received Labetalol IV 04/05/2010 @ ER (record reviewed & copy of labs & Xray given to [facility]). Her mother may have had HTN. The eve of 04/06 she had marked salt loading @ [employer] [facility].  Hypertension History:      She complains of headache, peripheral edema, and neurologic problems, but denies chest pain, palpitations, dyspnea with exertion, orthopnea, PND, visual symptoms, and syncope.  BP not checked since ER visit. Minor headache frontally. Minor edema RLE especially in am OR with prolonged travel . N&T in feet 04/7.        Positive major cardiovascular risk factors include female age 68 years old or older, hyperlipidemia, and hypertension.  Negative major cardiovascular risk factors include non-tobacco-user status.     Allergies: 1)  ! Metronidazole 2)  ! Biaxin 3)  Penicillin G Potassium (Penicillin G Potassium) 4)  Phenergan (Promethazine Hcl) 5)  Fosamax (Alendronate Sodium) 6)  * Guaifenesin Group  Review of Systems Eyes:  Denies blurring, double vision, and vision loss-both eyes. CV:  Denies leg cramps with exertion, lightheadness, and near fainting. Neuro:  Complains of numbness and tingling.  Physical Exam  General:  Thin but well-nourished,in no acute distress; alert,appropriate and cooperative  throughout examination Eyes:  No corneal or conjunctival inflammation noted.Perrla. Funduscopic exam benign, without hemorrhages, exudates or papilledema. Arteriolar narrowing Lungs:  Normal respiratory effort, chest expands symmetrically. Lungs are clear to auscultation, no crackles or wheezes. Heart:  Normal rate and regular rhythm. S1 and S2 accentuated without gallop, murmur, click, rub or other extra sounds. Abdomen:  Bowel sounds positive,abdomen soft and non-tender without masses, organomegaly or hernias noted. Aorta palpable w/o AAA. Pulses:  R and L carotid,radial,dorsalis pedis and posterior tibial pulses are full and equal bilaterally Extremities:  No clubbing, cyanosis, edema. Fine tremor of head   Impression & Recommendations:  Problem # 1:  HYPERTENSION (ICD-401.9)  Her updated medication list for this problem includes:    Toprol Xl 25 Mg Xr24h-tab (Metoprolol succinate) .Marland Kitchen... 1 by mouth once daily  Complete Medication List: 1)  Toprol Xl 25 Mg Xr24h-tab (Metoprolol succinate) .Marland Kitchen.. 1 by mouth once daily 2)  Amitriptyline Hcl 10 Mg Tabs (Amitriptyline hcl) .Marland Kitchen.. 1 qhs 3)  Clindamycin Hcl Caps (Clindamycin hcl caps) .... As needed as sbe for the dentist 4)  Multivitamin  5)  Baby Asa  .Marland Kitchen.. 1 by mouth qd 6)  Caltrate With Vit D  .... 1 daily 7)  Glucosamine Hcl 1000 Mg Tabs (Glucosamine hcl) .Marland Kitchen.. 1 by mouth once daily 8)  Ranitidine Hcl 150 Mg Tabs (Ranitidine hcl) .Marland Kitchen.. 1 q 12 hrs pre meal as needed for dyspepsia 9)  Optivar 0.05 % Soln (Azelastine hcl) .Marland KitchenMarland KitchenMarland Kitchen  I drop two times a day as needed into affected eye 10)  Phenazopyridine Hcl 100 Mg Tabs (Phenazopyridine hcl) .Marland Kitchen.. 1 as needed bladder pain 11)  Singulair 10 Mg Tabs (Montelukast sodium) .Marland Kitchen.. 1 once daily as needed for allergies  Hypertension Assessment/Plan:      The patient's hypertensive risk group is category B: At least one risk factor (excluding diabetes) with no target organ damage.  Today's blood pressure is  158/84.    Patient Instructions: 1)  Limit your Sodium (Salt) to less than 4 grams a day (slightly less than 1 teaspoon) to prevent fluid retention, swelling, or worsening or symptoms. 2)  Check your Blood Pressure regularly. If it is above: 140/90 ON AVERAGE  you should  increase Toprol XL  25 mg  to 2 once daily. If BP remains elevated on 2 Toprol  daily , a mild diuretic (HCTZ 12.5 mg daily  would be added). Marland Kitchen

## 2011-01-29 NOTE — Assessment & Plan Note (Signed)
Summary: hospital followup/kn   Vital Signs:  Patient profile:   77 year old female Weight:      138 pounds BMI:     24.15 Temp:     98.2 degrees F oral Pulse rate:   80 / minute Resp:     14 per minute BP sitting:   124 / 78  (left arm) Cuff size:   regular  Vitals Entered By: Shonna Chock CMA (July 27, 2010 11:42 AM) CC: 1.) Hospital Follow-up  2.) Concerns feet/legs   3.) ? if meds needed pre dental appointment 4.) Discuss Ranitidine   Primary Care Provider:  Marga Melnick, MD  CC:  1.) Hospital Follow-up  2.) Concerns feet/legs   3.) ? if meds needed pre dental appointment 4.) Discuss Ranitidine.  History of Present Illness: Hospital D/C Summary 06/26-27/2011 reviewed totally: mild AS & AI( she has been employing SBE prophylaxis pre dental work). No cardiac injury. Mild anemia : H/H 11/32. Colonoscopy 04/2010: normal. She is having intermittent dyspepsia; triggers reviewed, none of significance. She is also concerned about localized swelling RLE locally( mother & sister had smae issue) & also some discoloration over lower legs. She denies joint pain in legs.  Allergies: 1)  ! Metronidazole 2)  ! Biaxin 3)  Penicillin G Potassium (Penicillin G Potassium) 4)  Phenergan (Promethazine Hcl) 5)  Fosamax (Alendronate Sodium) 6)  * Guaifenesin Group  Review of Systems General:  Denies weight loss. ENT:  Denies difficulty swallowing and hoarseness. CV:  Denies leg cramps with exertion and swelling of hands. GI:  Denies abdominal pain, bloody stools, and dark tarry stools; Occasioanlly some food "slow going down , but relieved with burping when eating too fast".  Physical Exam  General:  Thin,in no acute distress; alert,appropriate and cooperative throughout examination Eyes:  No corneal or conjunctival inflammation noted. No icterus Mouth:  Oral mucosa and oropharynx without lesions or exudates.  Teeth in good repair.No pharyngeal erythema.   Lungs:  Normal respiratory effort,  chest expands symmetrically. Lungs are clear to auscultation, no crackles or wheezes. Heart:  normal rate, regular rhythm, no gallop, no rub, no JVD, no HJR, and grade 1/2-1 /6 systolic murmur @ LSB.   Abdomen:  Bowel sounds positive,abdomen soft and non-tender without masses, organomegaly or hernias noted. Pulses:  R and L dorsalis pedis and posterior tibial pulses are  decreased but  equal bilaterally. No ischemic changes Extremities:  No clubbing, cyanosis, edema. Venous spiders    Neurologic:  alert & oriented X3.   Skin:  Pigment deposits in legs Cervical Nodes:  No lymphadenopathy noted Axillary Nodes:  No palpable lymphadenopathy Psych:  memory intact for recent and remote, normally interactive, and good eye contact.     Impression & Recommendations:  Problem # 1:  NONSPECIFIC ABNORMAL ELECTROCARDIOGRAM (ICD-794.31) neg stress test  Problem # 2:  ESOPHAGEAL REFLUX (ICD-530.81)  Her updated medication list for this problem includes:    Ranitidine Hcl 150 Mg Tabs (Ranitidine hcl) .Marland Kitchen... 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 1 by mouth once daily 2)  Amitriptyline Hcl 10 Mg Tabs (Amitriptyline hcl) .Marland Kitchen.. 1 qhs 3)  Clindamycin Hcl Caps (Clindamycin  hcl caps) .... As needed as sbe for the dentist 4)  Multivitamin  5)  Baby Asa  .Marland Kitchen.. 1 by mouth qd 6)  Caltrate With Vit D  .... 1 daily 7)  Ranitidine Hcl 150 Mg Tabs (Ranitidine hcl) .Marland Kitchen.. 1 q 12 hrs pre meal as needed for dyspepsia 8)  Optivar 0.05 % Soln (Azelastine hcl) .... I drop two times a day as needed into affected eye 9)  Allegra 180  Mg Tabs (Fexofenadine hcl) .... Once daily as needed 10)  Amlodipine Besylate 2.5 Mg Tabs (Amlodipine besylate) .Marland Kitchen.. 1 once daily 11)  Balneol Lotn (Incontinent wash) .... Cleanse peri rectal ares bid  Patient Instructions: 1)  Avoid foods high in acid (tomatoes, citrus juices, spicy foods). Avoid eating within two hours of lying down or before exercising. Do not over eat; try smaller more frequent meals. Elevate head of bed twelve inches when sleeping. Trial of Ranitidine 150 mg before b'fast & eve meal for 6-8 weeks. Consider support hose if vein issues bothersome .  Appended Document: hospital followup/kn

## 2011-01-29 NOTE — Procedures (Signed)
Summary: Colonoscopy  Patient: Alicia Boyd Note: All result statuses are Final unless otherwise noted.  Tests: (1) Colonoscopy (COL)   COL Colonoscopy           DONE     [facility]     [address]     [place], [place]  [address]           COLONOSCOPY PROCEDURE REPORT           PATIENT:  [name], [name]  MR#:  [id number]     BIRTHDATE:  08/30/1930, 79 yrs. old  GENDER:  female     ENDOSCOPIST:  Vania Rea. Jarold Motto, MD, [facility]     REF. BY:     PROCEDURE DATE:  05/07/2010     PROCEDURE:  Average-risk screening colonoscopy     G0121     ASA CLASS:  Class II     INDICATIONS:  colorectal cancer screening, average risk     MEDICATIONS:   Fentanyl 50 mcg IV, Versed 5 mg IV           DESCRIPTION OF PROCEDURE:   After the risks benefits and     alternatives of the procedure were thoroughly explained, informed     consent was obtained.  Digital rectal exam was performed and     revealed perianal dermatitis.   The LB [id number]     endoscope was introduced through the anus and advanced to the     cecum, which was identified by both the appendix and ileocecal     valve, without limitations.  The quality of the prep was     excellent, using MoviPrep.  The instrument was then slowly     withdrawn as the colon was fully examined.     <<PROCEDUREIMAGES>>           FINDINGS:  No polyps or cancers were seen.  This was otherwise a     normal examination of the colon. RECTUM APPEARS NORMAL.     Retroflexed views in the rectum revealed no abnormalities.    The     scope was then withdrawn from the patient and the procedure     completed.           COMPLICATIONS:  None     ENDOSCOPIC IMPRESSION:     1) No polyps or cancers     2) Otherwise normal examination     RECOMMENDATIONS:     BALNEOL SOLUTION CLEANSING OF PERIRECTAL-ANAL AREA BID PER     DERMATITIS.     REPEAT EXAM:  No           ______________________________     Vania Rea. Jarold Motto, MD, [facility]           CC:         n.     eSIGNED:   Vania Rea. Sidnie Swalley at 05/07/2010 11:35 AM           Hardie Lora, [id number]  Note: An exclamation mark (!) indicates a result that was not dispersed into the flowsheet. Document Creation Date: 05/07/2010 11:36 AM _______________________________________________________________________  (1) Order result status: Final Collection or observation date-time: 05/07/2010 11:26 Requested date-time:  Receipt date-time:  Reported date-time:  Referring Physician:   Ordering Physician: Sheryn Bison [phone]) Specimen Source:  Source: Launa Grill Order Number: [id number] Lab site:   Appended Document: Colonoscopy Rx sent per request of Dr. Jarold Motto.   Clinical Lists Changes  Medications: Added new medication of BALNEOL  LOTN (  INCONTINENT WASH) Cleanse peri rectal ares bid - Signed Rx of BALNEOL  LOTN [facility]) Cleanse peri rectal ares bid;  #1 bottle x 3;  Signed;  Entered by: Ashok Cordia RN;  Authorized by: Mardella Layman MD [facility];  Method used: [facility] [phone]*, [address] [place], [place]  [address], Ph: [phone] or [phone], Fax: [phone]    Prescriptions: Jacquenette Shone [facility]) Cleanse peri rectal ares bid  #1 bottle x 3   Entered by:   Ashok Cordia RN   Authorized by:   Mardella Layman MD [facility]   Signed by:   Ashok Cordia RN on 05/07/2010   Method used:   [facility]ically to        [facility] [phone]* (retail)       [address]       [place], [place]  [address]       Ph: [phone] or [phone]       Fax: [phone]   RxID:   [id number]

## 2011-02-12 ENCOUNTER — Telehealth (INDEPENDENT_AMBULATORY_CARE_PROVIDER_SITE_OTHER): Payer: Self-pay | Admitting: *Deleted

## 2011-02-20 NOTE — Progress Notes (Signed)
Summary: Toprol, Amitriptyline--new local pharmacy  Phone Note Refill Request Call back at Home Phone [phone] Message from:  Patient on February 12, 2011 10:31 AM  Refills Requested: Medication #1:  TOPROL XL 25 MG  XR24H-TAB 1 by mouth once daily   Notes: qty = 90 days  Medication #2:  AMITRIPTYLINE HCL 10 MG  TABS 1 qhs   Notes: qty = 90 days no longer has to use [facility]--says they have "set it up" so that [facility] can [facility], but it still must be a 90 day prescription--- [facility] [address], [place], [place]  Next Appointment Scheduled: none Initial call taken by: Jerolyn Shin,  February 12, 2011 10:33 AM    Prescriptions: AMITRIPTYLINE HCL 10 MG  TABS (AMITRIPTYLINE HCL) 1 qhs  #90 x 0   Entered by:   Shonna Chock CMA   Authorized by:   Marga Melnick MD   Signed by:   Shonna Chock CMA on 02/12/2011   Method used:   Printed then faxed to ...       [facility] [phone] (retail)       [address]       [place], [place]  [address]       Ph: [phone] or [phone]       Fax: [phone]   RxID:   [id number] TOPROL XL 25 MG  XR24H-TAB (METOPROLOL SUCCINATE) 1 by mouth once daily  #90 x 1   Entered by:   Shonna Chock CMA   Authorized by:   Marga Melnick MD   Signed by:   Shonna Chock CMA on 02/12/2011   Method used:   Electronically to        [facility] [phone]* (retail)       [address]       [place], [place]  [address]       Ph: [phone] or [phone]       Fax: [phone]   RxID:   [id number]

## 2011-03-07 ENCOUNTER — Ambulatory Visit (INDEPENDENT_AMBULATORY_CARE_PROVIDER_SITE_OTHER): Payer: MEDICARE | Admitting: Family Medicine

## 2011-03-07 ENCOUNTER — Encounter: Payer: Self-pay | Admitting: Family Medicine

## 2011-03-07 DIAGNOSIS — J019 Acute sinusitis, unspecified: Secondary | ICD-10-CM | POA: Insufficient documentation

## 2011-03-07 DIAGNOSIS — I10 Essential (primary) hypertension: Secondary | ICD-10-CM

## 2011-03-12 NOTE — Assessment & Plan Note (Signed)
Summary: congested/cbs   Vital Signs:  Patient profile:   77 year old female Height:      63.5 inches (161.29 cm) Weight:      139 pounds (63.18 kg) BMI:     24.32 Temp:     98.9 degrees F (37.17 degrees C) oral BP sitting:   160 / 88  (left arm) Cuff size:   regular  Vitals Entered By: Lucious Groves CMA (March 07, 2011 2:45 PM) CC: URI/Sinus inf x5 days./kb Is Patient Diabetic? No Pain Assessment Patient in pain? no      Comments Patient notes that she has been congested, having HA, cough, and nasal bleeding. Patient denies fever, SOB, and chest pain./kb   History of Present Illness: 77 yo woman here today for ? sinus infxn.  sxs started Monday w/ runny nose and sore throat.  now having sinus pain and pressure.  no ear pain.  + HA.  no fevers.  + dry cough.  has not been taking decongestants but has been eating a lot of chicken soup (high in sodium).  reports that Zpacks 'always work for me'.  Current Medications (verified): 1)  Toprol Xl 25 Mg  Xr24h-Tab (Metoprolol Succinate) .Marland Kitchen.. 1 By Mouth Once Daily 2)  Amitriptyline Hcl 10 Mg  Tabs (Amitriptyline Hcl) .Marland Kitchen.. 1 Qhs 3)  Clindamycin Hcl   Caps (Clindamycin Hcl Caps) .... As Needed As Sbe For The Dentist 4)  Multivitamin 5)  Baby Asa .Marland Kitchen.. 1 By Mouth Qd 6)  Caltrate With Vit D .... 1 Daily 7)  Ranitidine Hcl 150 Mg Tabs (Ranitidine Hcl) .Marland Kitchen.. 1 Q 12 Hrs Pre Meal As Needed For Dyspepsia 8)  Optivar 0.05 % Soln (Azelastine Hcl) .... I Drop Two Times A Day As Needed Into Affected Eye 9)  Allegra 180 Mg Tabs (Fexofenadine Hcl) .... Once Daily As Needed 10)  Amlodipine Besylate 2.5 Mg Tabs (Amlodipine Besylate) .Marland Kitchen.. 1 Once Daily  Allergies (verified): 1)  ! Metronidazole 2)  ! Biaxin 3)  Penicillin G Potassium (Penicillin G Potassium) 4)  Phenergan (Promethazine Hcl) 5)  Fosamax (Alendronate Sodium) 6)  * Guaifenesin Group  Review of Systems      See HPI  Physical Exam  General:  Thin,in no acute distress;  alert,appropriate and cooperative throughout examination Head:  Normocephalic and atraumatic without obvious abnormalities. + TTP over maxillary sinuses Eyes:  no injxn or inflammation Ears:  External ear exam shows no significant lesions or deformities.  Otoscopic examination reveals clear canals, tympanic membranes are intact bilaterally without bulging, retraction, inflammation or discharge. Hearing is grossly normal bilaterally. Nose:  External nasal examination shows no deformity or inflammation. Nasal mucosa : mild edema R nare without lesions or exudates. Mouth:  Oral mucosa and oropharynx without lesions or exudates.  Teeth in good repair.No pharyngeal erythema.   Neck:  No deformities, masses, or tenderness noted. Lungs:  Normal respiratory effort, chest expands symmetrically. Lungs are clear to auscultation, no crackles or wheezes. Heart:  normal rate, regular rhythm Pulses:  +2 carotid, radial, DP Extremities:  no C/C/E   Impression & Recommendations:  Problem # 1:  SINUSITIS - ACUTE-NOS (ICD-461.9) Assessment New  start Zpack at pt's request.  reviewed supportive care and red flags that should prompt return.  Pt expresses understanding and is in agreement w/ this plan. Her updated medication list for this problem includes:    Clindamycin Hcl Caps (Clindamycin hcl caps) .Marland Kitchen... As needed as sbe for the dentist    Azithromycin 250  Mg Tabs (Azithromycin) .Marland Kitchen... 2 by  mouth today and then 1 daily for 4 days  Orders: Prescription Created Electronically [id number])  Problem # 2:  HYPERTENSION (ICD-401.9) Assessment: Deteriorated pt's BP is elevated today.  she feels it is due to increased salt intake.  asymptomatic.  will have pt return in 2 weeks to recheck BP.  Pt expresses understanding and is in agreement w/ this plan. Her updated medication list for this problem includes:    Toprol Xl 25 Mg Xr24h-tab (Metoprolol succinate) .Marland Kitchen... 1 by mouth once daily    Amlodipine Besylate 2.5 Mg  Tabs (Amlodipine besylate) .Marland Kitchen... 1 once daily  Complete Medication List: 1)  Toprol Xl 25 Mg Xr24h-tab (Metoprolol succinate) .Marland Kitchen.. 1 by mouth once daily 2)  Amitriptyline Hcl 10 Mg Tabs (Amitriptyline hcl) .Marland Kitchen.. 1 qhs 3)  Clindamycin Hcl Caps (Clindamycin hcl caps) .... As needed as sbe for the dentist 4)  Multivitamin  5)  Baby Asa  .Marland Kitchen.. 1 by mouth qd 6)  Caltrate With Vit D  .... 1 daily 7)  Ranitidine Hcl 150 Mg Tabs (Ranitidine hcl) .Marland Kitchen.. 1 q 12 hrs pre meal as needed for dyspepsia 8)  Optivar 0.05 % Soln (Azelastine hcl) .... I drop two times a day as needed into affected eye 9)  Allegra 180 Mg Tabs (Fexofenadine hcl) .... Once daily as needed 10)  Amlodipine Besylate 2.5 Mg Tabs (Amlodipine besylate) .Marland Kitchen.. 1 once daily 11)  Azithromycin 250 Mg Tabs (Azithromycin) .... 2 by  mouth today and then 1 daily for 4 days  Patient Instructions: 1)  Take the Azithromycin as directed for the sinus infection 2)  Drink plenty of fluids 3)  Restart the Allegra for your seasonal allergies 4)  Call with any questions or concerns 5)  Hang in there! Prescriptions: AZITHROMYCIN 250 MG  TABS (AZITHROMYCIN) 2 by  mouth today and then 1 daily for 4 days  #6 x 0   Entered and Authorized by:   Neena Rhymes MD   Signed by:   Neena Rhymes MD on 03/07/2011   Method used:   Electronically to        [facility] [phone]* (retail)       [address]       [place], [place]  [address]       Ph: [phone] or [phone]       Fax: [phone]   RxID:   [id number]    Orders Added: 1)  Est. Patient Level III [59563] 2)  Prescription Created Electronically [phone]

## 2011-03-16 ENCOUNTER — Encounter: Payer: Self-pay | Admitting: Internal Medicine

## 2011-03-17 LAB — CBC
HCT: 32 % — ABNORMAL LOW (ref 36.0–46.0)
Hemoglobin: 12.6 g/dL (ref 12.0–15.0)
MCHC: 34.3 g/dL (ref 30.0–36.0)
Platelets: 185 10*3/uL (ref 150–400)
RBC: 3.84 MIL/uL — ABNORMAL LOW (ref 3.87–5.11)
RDW: 12.9 % (ref 11.5–15.5)
WBC: 7.3 10*3/uL (ref 4.0–10.5)
WBC: 8.6 10*3/uL (ref 4.0–10.5)

## 2011-03-17 LAB — CARDIAC PANEL(CRET KIN+CKTOT+MB+TROPI)
CK, MB: 1.3 ng/mL (ref 0.3–4.0)
Relative Index: INVALID (ref 0.0–2.5)
Relative Index: INVALID (ref 0.0–2.5)
Total CK: 33 U/L (ref 7–177)
Troponin I: 0.01 ng/mL (ref 0.00–0.06)

## 2011-03-17 LAB — POCT I-STAT, CHEM 8
Chloride: 103 mEq/L (ref 96–112)
Creatinine, Ser: 0.8 mg/dL (ref 0.4–1.2)
Glucose, Bld: 110 mg/dL — ABNORMAL HIGH (ref 70–99)
HCT: 37 % (ref 36.0–46.0)
Potassium: 3.9 mEq/L (ref 3.5–5.1)

## 2011-03-17 LAB — POCT CARDIAC MARKERS
CKMB, poc: <1 ng/mL — ABNORMAL LOW (ref 1.0–8.0)
Myoglobin, poc: 31.8 ng/mL (ref 12–200)
Troponin i, poc: <0.05 ng/mL (ref 0.00–0.09)
Troponin i, poc: <0.05 ng/mL (ref 0.00–0.09)

## 2011-03-17 LAB — CK TOTAL AND CKMB (NOT AT ARMC)
CK, MB: 1.9 ng/mL (ref 0.3–4.0)
Total CK: 48 U/L (ref 7–177)

## 2011-03-17 LAB — TROPONIN I: Troponin I: <0.01 ng/mL (ref 0.00–0.06)

## 2011-03-17 LAB — HEPARIN LEVEL (UNFRACTIONATED): Heparin Unfractionated: 0.54 IU/mL (ref 0.30–0.70)

## 2011-03-17 LAB — DIFFERENTIAL
Lymphs Abs: 1 10*3/uL (ref 0.7–4.0)
Monocytes Relative: 5 % (ref 3–12)
Neutro Abs: 7 10*3/uL (ref 1.7–7.7)
Neutrophils Relative %: 82 % — ABNORMAL HIGH (ref 43–77)

## 2011-03-17 LAB — APTT: aPTT: 26 seconds (ref 24–37)

## 2011-03-18 ENCOUNTER — Telehealth (INDEPENDENT_AMBULATORY_CARE_PROVIDER_SITE_OTHER): Payer: Self-pay | Admitting: *Deleted

## 2011-03-19 ENCOUNTER — Emergency Department (HOSPITAL_COMMUNITY)
Admission: EM | Admit: 2011-03-19 | Discharge: 2011-03-19 | Disposition: A | Discharge status: Home / Self Care | Payer: MEDICARE | Attending: Emergency Medicine | Admitting: Emergency Medicine

## 2011-03-19 ENCOUNTER — Telehealth: Payer: Self-pay | Admitting: *Deleted

## 2011-03-19 DIAGNOSIS — I10 Essential (primary) hypertension: Secondary | ICD-10-CM | POA: Insufficient documentation

## 2011-03-19 NOTE — Telephone Encounter (Signed)
noted 

## 2011-03-20 ENCOUNTER — Ambulatory Visit (INDEPENDENT_AMBULATORY_CARE_PROVIDER_SITE_OTHER): Payer: MEDICARE | Admitting: Internal Medicine

## 2011-03-20 ENCOUNTER — Encounter: Payer: Self-pay | Admitting: Internal Medicine

## 2011-03-20 VITALS — BP 157/71 | HR 82 | Temp 98.2°F | Wt 137.0 lb

## 2011-03-20 DIAGNOSIS — H8110 Benign paroxysmal vertigo, unspecified ear: Secondary | ICD-10-CM

## 2011-03-20 DIAGNOSIS — I10 Essential (primary) hypertension: Secondary | ICD-10-CM

## 2011-03-20 LAB — DIFFERENTIAL
Basophils Absolute: 0 10*3/uL (ref 0.0–0.1)
Basophils Relative: 1 % (ref 0–1)
Eosinophils Absolute: 0.2 10*3/uL (ref 0.0–0.7)
Eosinophils Relative: 4 % (ref 0–5)
Lymphocytes Relative: 15 % (ref 12–46)
Monocytes Absolute: 0.2 10*3/uL (ref 0.1–1.0)
Neutro Abs: 4.4 10*3/uL (ref 1.7–7.7)
Neutrophils Relative %: 77 % (ref 43–77)

## 2011-03-20 LAB — CBC
Platelets: 204 10*3/uL (ref 150–400)
RBC: 4.1 MIL/uL (ref 3.87–5.11)
WBC: 5.6 10*3/uL (ref 4.0–10.5)

## 2011-03-20 LAB — BASIC METABOLIC PANEL
BUN: 12 mg/dL (ref 6–23)
Chloride: 106 mEq/L (ref 96–112)
Creatinine, Ser: 0.6 mg/dL (ref 0.4–1.2)
GFR calc non Af Amer: >60 mL/min (ref >60)

## 2011-03-20 LAB — POCT CARDIAC MARKERS

## 2011-03-20 MED ORDER — HYDROCHLOROTHIAZIDE 25 MG PO TABS: 12.5000 mg | ORAL_TABLET | Freq: Every day | ORAL | Status: DC

## 2011-03-20 NOTE — Patient Instructions (Signed)
Start the hydrochlorothiazide 25 mg one half pill daily. Monitor blood pressure; the minimum goal as an average less than 140/90. Discuss changing the amitriptyline to the prior form with your pharmacist.   If the symptoms of benign positional vertigo work her I will prescribe diazepam 2 mg every 8 hours and refer you to the physical therapist for instructions and preventive maneuvers.

## 2011-03-20 NOTE — Progress Notes (Signed)
  Subjective:    Patient ID: Alicia Boyd, female    DOB: [birth year], 77 y.o.   MRN: [id number]  HPI   Mrs. Presents with  an acute imbalance episode. On Tuesday 3/20 /2012 she had acute dizziness type symptoms upon going  to bed. She describes it as if the "mattress were folded upon me). She noticed some imbalance  when she tried to walk. She denies any frank vertigo with the lightheaded episodes.   She questions whether this will might be related to amitriptyline 10 mg. The new prescription is not only of different color ,but it also  seemed to resolve rapidly. She been taking 10 mg at bedtime chronically.    Significantly she had an upper respiratory tract infection several weeks ago which resolved with Zithromax. At this time she has no active symptoms of rhinosinusitis.   Associated symptom  with the imbalance was a diffuse "squeaking" headache. The headache resolved after she had been in the emergency room for several hours on the evening of 3/20  w/o Rx. She denied any acute neurologic symptoms with the event.    Review of Systems   At this time she denies frontal headache, facial pain, purulent nasal discharge, sore throat, or tender lymphadenopathy. She has no new numbness or tingling or asymmetric weakness. There's been no acute hearing loss or tinnitus. She denies blurred or double vision or loss of vision.      Objective:   Physical Exam  On physical exam she is in no acute distress. Pupils are equally round and reactive to light. Extraocular motions intact without nystagmus. Nares are clear without exudate. Oral pharynx reveals no erythema or exudate. Dental hygiene is excellent. The neurologic exam is unremarkable. Hearing is grossly intact. She has no lymphadenopathy about the neck or axilla   Chest is clear. She has an S4 with slight slurring but no murmurs or gallops are present.   Strength is equal and normal in the extremities. Deep tendon reflexes are normal. Gait is  normal without imbalance. Romberg testing is negative as is finger to nose with eyes closed.        Assessment & Plan:   #1 symptoms are classic for benign positional vertigo based on the history of acute symptoms with the change in position. She denies frank vertigo. The most likely etiology would be residual edema in the middle ear related to the recent upper respiratory tract infection.   #2 hypertension suboptimally controlled.  Plan: #1 low-dose thiazide diuretic would be appropriate for both the balance issues as well as the suboptimally controlled blood pressure. Blood pressure goal should be an average of less than 140/90. If his symptoms or severe low-dose diazepam would be recommended. If the symptoms recur or persist referral to physical therapy for interventional maneuvers would be indicated.

## 2011-03-21 ENCOUNTER — Other Ambulatory Visit: Payer: Self-pay | Admitting: Internal Medicine

## 2011-03-21 ENCOUNTER — Ambulatory Visit: Payer: MEDICARE | Admitting: Internal Medicine

## 2011-03-21 NOTE — Telephone Encounter (Signed)
Refill x1. Mark on prescription to be used for allergic symptoms only, not infection. It should not be used if fever or purulent secretions ( colored secretions) are present.

## 2011-03-28 NOTE — Progress Notes (Signed)
Summary: Elevated BP  Phone Note Call from Patient Call back at Home Phone [phone]   Caller: Patient Call For: Marga Melnick MD Summary of Call: Patient bp was 179/75 this morning she thinks.  She checked it again a few minutes ago and it read 182/90.  Patient is concerned about elevated numbers. Initial call taken by: Barnie Mort,  March 18, 2011 12:42 PM  Follow-up for Phone Call         increase Toprol XL 25 milligrams to 2 pills daily. Please make appointment if the blood pressure does not respond within 48 hours Follow-up by: Marga Melnick MD,  March 18, 2011 12:59 PM  Additional Follow-up for Phone Call Additional follow up Details #1::        Spoke w/ patient aware of instructions already has appt scheduled for thurs so will keep track of bp reading and bring in cuff on this day also .Marland KitchenMarland KitchenDoristine Devoid CMA  March 18, 2011 1:33 PM     New/Updated Medications: TOPROL XL 25 MG  XR24H-TAB (METOPROLOL SUCCINATE) 2 by mouth once daily

## 2011-05-09 ENCOUNTER — Telehealth: Payer: Self-pay | Admitting: *Deleted

## 2011-05-09 DIAGNOSIS — H8110 Benign paroxysmal vertigo, unspecified ear: Secondary | ICD-10-CM

## 2011-05-09 DIAGNOSIS — I10 Essential (primary) hypertension: Secondary | ICD-10-CM

## 2011-05-09 MED ORDER — HYDROCHLOROTHIAZIDE 25 MG PO TABS: 12.5000 mg | ORAL_TABLET | Freq: Every day | ORAL | Status: DC

## 2011-05-09 NOTE — Telephone Encounter (Signed)
Pt requested 90 day supply.

## 2011-05-17 NOTE — Assessment & Plan Note (Signed)
[facility]                                 ON-CALL NOTE   WILBUR, LABUDA                       MRN:          [id number]  DATE:04/17/2007                            DOB:          [birth year]    PRIMARY:  Dr. Alwyn Ren.   PHONE NUMBER:  [phone].   SUBJECTIVE:  Ms. Billiot was on metronidazole up until lunch time today  for a sinus infection.  She had been having some irritation into the  back of her throat and some initial throat swelling, so she called her  primary, and he gave her Magic Mouthwash, told her to stop the  metronidazole, and change instead to a Z-Pak.  She has not started this  yet and did have 2 doses of metronidazole today.  She feels that she is  having progression of her throat swelling.  She is having difficulty  breathing.  Her symptoms have been gradually worsening through the  evening.  She has not had any problems breathing yet, but is concerned.   ASSESSMENT AND PLAN:  Given progression of symptoms, recommended  emergency room visit for consideration of steroid injection and  evaluation of airway.  The patient is not currently in respiratory  distress and is able to talk in complete sentences, so she was told that  she can go to the emergency room by a personal car, but if she has any  rapid progression of symptoms or shortness of breath, to call an  ambulance immediately.     Kerby Nora, MD  Electronically Signed    AB/MedQ  DD: 04/18/2007  DT: 04/18/2007  Job #: [id number]

## 2011-06-08 ENCOUNTER — Encounter: Payer: Self-pay | Admitting: Internal Medicine

## 2011-06-10 ENCOUNTER — Other Ambulatory Visit: Payer: Self-pay | Admitting: Internal Medicine

## 2011-06-10 MED ORDER — AMITRIPTYLINE HCL 10 MG PO TABS: 10.0000 mg | ORAL_TABLET | Freq: Every day | ORAL | Status: DC

## 2011-06-10 NOTE — Telephone Encounter (Signed)
done

## 2011-06-13 ENCOUNTER — Other Ambulatory Visit: Payer: Self-pay | Admitting: Internal Medicine

## 2011-06-13 ENCOUNTER — Encounter: Payer: Self-pay | Admitting: Internal Medicine

## 2011-06-13 ENCOUNTER — Ambulatory Visit (INDEPENDENT_AMBULATORY_CARE_PROVIDER_SITE_OTHER): Payer: MEDICARE | Admitting: Internal Medicine

## 2011-06-13 VITALS — BP 120/70 | HR 76 | Temp 97.3°F | Resp 12 | Ht 64.0 in | Wt 135.0 lb

## 2011-06-13 DIAGNOSIS — R9431 Abnormal electrocardiogram [ECG] [EKG]: Secondary | ICD-10-CM

## 2011-06-13 DIAGNOSIS — K219 Gastro-esophageal reflux disease without esophagitis: Secondary | ICD-10-CM

## 2011-06-13 DIAGNOSIS — M810 Age-related osteoporosis without current pathological fracture: Secondary | ICD-10-CM

## 2011-06-13 DIAGNOSIS — Z0000 Encounter for general adult medical examination without abnormal findings: Secondary | ICD-10-CM

## 2011-06-13 DIAGNOSIS — I10 Essential (primary) hypertension: Secondary | ICD-10-CM

## 2011-06-13 DIAGNOSIS — E785 Hyperlipidemia, unspecified: Secondary | ICD-10-CM

## 2011-06-13 LAB — CBC WITH DIFFERENTIAL/PLATELET
Basophils Absolute: 0 10*3/uL (ref 0.0–0.1)
Eosinophils Absolute: 0.1 10*3/uL (ref 0.0–0.7)
Hemoglobin: 13.6 g/dL (ref 12.0–15.0)
Lymphocytes Relative: 23.3 % (ref 12.0–46.0)
Lymphs Abs: 1.3 10*3/uL (ref 0.7–4.0)
MCHC: 34.8 g/dL (ref 30.0–36.0)
Neutro Abs: 3.8 10*3/uL (ref 1.4–7.7)
RDW: 12.7 % (ref 11.5–14.6)

## 2011-06-13 LAB — HEPATIC FUNCTION PANEL
ALT: 21 U/L (ref 0–35)
AST: 19 U/L (ref 0–37)
Alkaline Phosphatase: 89 U/L (ref 39–117)
Bilirubin, Direct: 0.1 mg/dL (ref 0.0–0.3)
Total Bilirubin: 0.8 mg/dL (ref 0.3–1.2)

## 2011-06-13 LAB — BASIC METABOLIC PANEL
CO2: 31 mEq/L (ref 19–32)
Calcium: 9.6 mg/dL (ref 8.4–10.5)
Glucose, Bld: 94 mg/dL (ref 70–99)
Sodium: 137 mEq/L (ref 135–145)

## 2011-06-13 NOTE — Progress Notes (Signed)
Subjective:    Patient ID: Alicia Boyd, female    DOB: 12-02-1930, 77 y.o.   MRN: [id number]  HPI Medicare Wellness Visit:  The following psychosocial & medical history were reviewed as required by Medicare.   Social history: caffeine: 1 cup coffee , alcohol:  no ,  tobacco use : no  & exercise : walking 4X/ week 30-60 min/ day.   Home & personal  safety / fall risk: no issues, activities of daily living: no limitations , seatbelt use : yes , and smoke alarm employment : yes .  Power of Attorney/Living Will status : in place  Vision ( as recorded per Nurse) & Hearing  evaluation :  Lenses needed to read wall chart;whisper heard @ 6 ft. Orientation :oriented x3 , memory & recall :good, spelling or math testing: good,and mood & affect : normal . Depression / anxiety: denied Travel history : 48s [place] , immunization status :shingles needed , transfusion history:  no, and preventive health surveillance ( colonoscopies, BMD , etc as per protocol/ SOC): all up date, Dental care:  Seen every 6 mos . Chart reviewed &  Updated. Active issues reviewed & addressed.       Review of Systems Patient reports no vision/ hearing  changes, adenopathy,fever, weight change,  persistant / recurrent hoarseness ,  chest pain,edema,persistant /recurrent cough, hemoptysis, dyspnea( rest/ exertional/paroxysmal nocturnal), gastrointestinal bleeding(melena, rectal bleeding), abdominal pain, significant heartburn,  GU symptoms(dysuria, hematuria,pyuria, incontinence) ), Gyn symptoms(abnormal  bleeding , pain),  syncope, focal weakness, memory loss,numbness & tingling, skin/hair /nail changes,abnormal bruising or bleeding, anxiety,or depression.  Occasional swallowing difficulty  "when I eat too fast". Chronic constipation     Objective:   Physical Exam Gen.: Thin but healthy and well-nourished in appearance. Alert, appropriate and cooperative throughout exam. Head: Normocephalic without obvious  abnormalities Eyes: No corneal or conjunctival inflammation noted. Pupils equal round reactive to light and accommodation. Fundal exam is benign without hemorrhages, exudate, papilledema. Extraocular motion intact. Vision grossly normal. Ptosis L lid Ears: External  ear exam reveals no significant lesions or deformities. Canals clear .TMs normal. Hearing is grossly normal bilaterally. Nose: External nasal exam reveals no deformity or inflammation. Nasal mucosa are pink and moist. No lesions or exudates noted. Mouth: Oral mucosa and oropharynx reveal no lesions or exudates. Teeth in good repair. Neck: No deformities, masses, or tenderness noted. Range of motion &. Thyroid normal. Possible L cervical rib Lungs: Normal respiratory effort; chest expands symmetrically. Lungs are clear to auscultation without rales, wheezes, or increased work of breathing. Heart: Normal rate and rhythm. Normal S1 and S2. No gallop, click, or rub. Soft  R base systolic  murmur. Abdomen: Bowel sounds normal; abdomen soft and nontender. No masses, organomegaly or hernias noted. Genitalia: as per Gyn                                                                                      Musculoskeletal/extremities: No deformity or scoliosis noted of  the thoracic or lumbar spine. No clubbing, cyanosis, or  Edema. OA/DJD finger changes. noted. Range of motion  normal .Tone & strength  normal.Nail health  good. Vascular: Carotid, radial  artery, dorsalis pedis and dorsalis posterior tibial pulses are full and equal. No bruits present. Neurologic: Alert and oriented x3. Deep tendon reflexes symmetrical and normal.          Skin: Intact without suspicious lesions or rashes. Lymph: No cervical, axillary, or inguinal lymphadenopathy present. Psych: Mood and affect are normal. Normally interactive                                                                                          Assessment & Plan:  #1 Medicare Wellness  Exam; criteria met ; data entered #2 Problem List reviewed ; Assessment/ Recommendations made Plan: see Orders

## 2011-06-13 NOTE — Patient Instructions (Addendum)
Preventive Health Care: Exercise  30-45  minutes a day, 3-4 days a week. Walking is especially valuable in preventing Osteoporosis. Eat a low-fat diet with lots of fruits and vegetables, up to 7-9 servings per day. Consume less than 30 grams of sugar per day from foods & drinks with High Fructose Corn Syrup as #2,3 or #4 on label. The triggers for dyspepsia or "heart burn"  include stress; the "aspirin family" ; alcohol; peppermint; and caffeine (coffee, tea, cola, and chocolate). The aspirin family would include aspirin and the nonsteroidal agents such as ibuprofen &  Naproxen. Tylenol would not cause reflux. If having dyspepsia ; food & dink should be avoided for @ least 2 hors before going to bed. See Dr Jarold Motto if food sticks with meals. Please see Dr Christella Hartigan if swallowing issues persist or progress.  Take antibiotics before dental procedures (SBE Prophylaxis). Please research the agent Prolia to treat your  Osteoporosis which is advanced.

## 2011-06-14 ENCOUNTER — Encounter: Payer: Self-pay | Admitting: Internal Medicine

## 2011-10-10 ENCOUNTER — Other Ambulatory Visit: Payer: Self-pay | Admitting: Internal Medicine

## 2011-10-10 NOTE — Telephone Encounter (Signed)
Patient needs rx for amtriptoline - rx needs to state pink pill  by sandoz manufacturer --[facility]

## 2011-10-11 MED ORDER — AMITRIPTYLINE HCL 10 MG PO TABS: 10.0000 mg | ORAL_TABLET | Freq: Every day | ORAL | Status: DC

## 2011-10-11 NOTE — Telephone Encounter (Signed)
RX sent

## 2011-12-10 ENCOUNTER — Ambulatory Visit (INDEPENDENT_AMBULATORY_CARE_PROVIDER_SITE_OTHER): Payer: MEDICARE

## 2011-12-10 DIAGNOSIS — Z23 Encounter for immunization: Secondary | ICD-10-CM

## 2012-01-27 ENCOUNTER — Ambulatory Visit: Payer: MEDICARE | Admitting: Internal Medicine

## 2012-01-27 ENCOUNTER — Encounter: Payer: Self-pay | Admitting: Internal Medicine

## 2012-01-27 ENCOUNTER — Ambulatory Visit (INDEPENDENT_AMBULATORY_CARE_PROVIDER_SITE_OTHER): Payer: MEDICARE | Admitting: Internal Medicine

## 2012-01-27 DIAGNOSIS — R202 Paresthesia of skin: Secondary | ICD-10-CM

## 2012-01-27 DIAGNOSIS — R209 Unspecified disturbances of skin sensation: Secondary | ICD-10-CM | POA: Diagnosis not present

## 2012-01-27 DIAGNOSIS — Z862 Personal history of diseases of the blood and blood-forming organs and certain disorders involving the immune mechanism: Secondary | ICD-10-CM | POA: Diagnosis not present

## 2012-01-27 DIAGNOSIS — R002 Palpitations: Secondary | ICD-10-CM

## 2012-01-27 DIAGNOSIS — Z0000 Encounter for general adult medical examination without abnormal findings: Secondary | ICD-10-CM | POA: Diagnosis not present

## 2012-01-27 DIAGNOSIS — I10 Essential (primary) hypertension: Secondary | ICD-10-CM

## 2012-01-27 DIAGNOSIS — R9431 Abnormal electrocardiogram [ECG] [EKG]: Secondary | ICD-10-CM

## 2012-01-27 MED ORDER — AMLODIPINE BESYLATE 5 MG PO TABS: 5.0000 mg | ORAL_TABLET | Freq: Every day | ORAL | Status: DC

## 2012-01-27 NOTE — Patient Instructions (Signed)
Blood Pressure Goal  Ideally is an AVERAGE < 135/85. This AVERAGE should be calculated from @ least 5-7 BP readings taken @ different times of day on different days of week. You should not respond to isolated BP readings , but rather the AVERAGE for that week .To prevent palpitations or premature beats, avoid stimulants such as decongestants, diet pills, nicotine, or caffeine (coffee, tea, cola, or chocolate) to excess.   

## 2012-01-27 NOTE — Progress Notes (Signed)
  Subjective:    Patient ID: Alicia Boyd, female    DOB: 1930-05-04, 78 y.o.   MRN: [id number]  HPI For the past month she's been awakening with paresthesias of the thumb and first 2 fingers of each hand. This will resolve over a short period of time  Additionally in the last 6 weeks 1 -2 times a week , she has had intermittent  palpitations. For several hours after taking her 3 blood pressure medications; she will notice palpitations or shortness of breath. She is unable to define this for sure. She is on amitriptyline but she does not relate her symptoms to this medication.  Blood pressures have varied from 138/73 to high of 168/83.    Review of Systems   She denies any thyroid disorder. She denies ingestion of stimulants such as decongestants, diet pills, nicotine, or caffeine other than a half a cup of coffee a day total.     Objective:   Physical Exam  Gen.: Thin but well-nourished; in no acute distress Eyes: Extraocular motion intact; no lid lag or proptosis Neck: full ROM ; thyroid normal Heart: Normal rhythm and rate without significant  gallop, or extra heart sounds.Grade 1/6 systolic murmur  Lungs: Chest clear to auscultation without rales,rales, wheezes Neuro:Deep tendon reflexes are equal and within normal limits; no tremor . Tinel sign is negative bilaterally  Abdomen: Aorta is palpable; there is no aortic aneurysm present. No organomegaly or masses. No renal artery bruits are noted Skin: Warm and dry without significant lesions or rashes; no onycholysis Psych: Normally communicative and interactive; no abnormal mood or affect clinically.         Assessment & Plan:  #1 paresthesias of hands in am. Positional carpal tunnel symptoms are suggested. She should sleep with wrist splints as a trial if labs are normal. If symptoms persist or progress; EMG/nerve conduction test will be performed.  #2 palpitations, intermittent. She questions whether these are related to  blood pressure medicines. I would be more concerned about amitriptyline effect. Amlodipine and beta blockers should not cause palpitations. HCTZ could possibly lower her potassium and cause dysrhythmias.  EKG reveals no dysrhythmia. She has diffuse ST-T wave changes which suggested dig effect. She is not on digitalis.

## 2012-01-28 LAB — BASIC METABOLIC PANEL
BUN: 10 mg/dL (ref 6–23)
CO2: 30 mEq/L (ref 19–32)
Chloride: 98 mEq/L (ref 96–112)
Glucose, Bld: 105 mg/dL — ABNORMAL HIGH (ref 70–99)
Potassium: 4.9 mEq/L (ref 3.5–5.1)
Sodium: 135 mEq/L (ref 135–145)

## 2012-01-28 LAB — CBC WITH DIFFERENTIAL/PLATELET
Basophils Relative: 1.7 % (ref 0.0–3.0)
Eosinophils Relative: 3.6 % (ref 0.0–5.0)
HCT: 37.3 % (ref 36.0–46.0)
Lymphs Abs: 1.5 10*3/uL (ref 0.7–4.0)
MCV: 93.4 fl (ref 78.0–100.0)
Monocytes Absolute: 0.4 10*3/uL (ref 0.1–1.0)
Monocytes Relative: 8.6 % (ref 3.0–12.0)
Platelets: 219 10*3/uL (ref 150.0–400.0)
RBC: 3.99 Mil/uL (ref 3.87–5.11)
WBC: 4.2 10*3/uL — ABNORMAL LOW (ref 4.5–10.5)

## 2012-01-28 LAB — TSH: TSH: 1.04 u[IU]/mL (ref 0.35–5.50)

## 2012-01-29 LAB — HEMOGLOBIN A1C: Hgb A1c MFr Bld: 5.6 % (ref 4.6–6.5)

## 2012-02-14 ENCOUNTER — Other Ambulatory Visit: Payer: Self-pay | Admitting: Internal Medicine

## 2012-02-14 ENCOUNTER — Ambulatory Visit: Payer: MEDICARE | Admitting: Internal Medicine

## 2012-02-14 ENCOUNTER — Telehealth: Payer: Self-pay | Admitting: Internal Medicine

## 2012-02-14 NOTE — Telephone Encounter (Signed)
Stop amitriptyline; it does  not need to be weaned. If the numbness and tingling in the extremities persist or progress off Amitriptyline;I  recommend a neurology evaluation.

## 2012-02-14 NOTE — Telephone Encounter (Signed)
Will send to Dr. Alwyn Ren for input.

## 2012-02-14 NOTE — Telephone Encounter (Signed)
Patient states that she is still having numbness in her hands and feet. She states that she wants to stop taking amitriptiline and would like to know if she can immediately stop this med or if she needs to lower dosage, then stop med?

## 2012-02-14 NOTE — Telephone Encounter (Signed)
Discuss with patient  

## 2012-03-03 ENCOUNTER — Encounter: Payer: Self-pay | Admitting: Cardiology

## 2012-03-03 ENCOUNTER — Ambulatory Visit (INDEPENDENT_AMBULATORY_CARE_PROVIDER_SITE_OTHER): Payer: MEDICARE | Admitting: Cardiology

## 2012-03-03 VITALS — BP 156/90 | HR 87 | Ht 64.0 in | Wt 137.0 lb

## 2012-03-03 DIAGNOSIS — R9431 Abnormal electrocardiogram [ECG] [EKG]: Secondary | ICD-10-CM

## 2012-03-03 DIAGNOSIS — R002 Palpitations: Secondary | ICD-10-CM | POA: Diagnosis not present

## 2012-03-03 DIAGNOSIS — I10 Essential (primary) hypertension: Secondary | ICD-10-CM | POA: Diagnosis not present

## 2012-03-03 MED ORDER — HYDROCHLOROTHIAZIDE 25 MG PO TABS: 25.0000 mg | ORAL_TABLET | Freq: Every day | ORAL | Status: DC

## 2012-03-03 NOTE — Assessment & Plan Note (Signed)
BP is running high at home.  She only takes 12.5 mg daily of HCTZ.  I will have her increase this to the full pill (25 mg daily) with BMET in 2 wks.

## 2012-03-03 NOTE — Patient Instructions (Signed)
Increase hydrochlorothiazide to 25mg  daily--this will be 1 tablet daily.  Your physician recommends that you return for lab work in: 2 weeks--BMET 794.31  Your physician has requested that you have an echocardiogram. Echocardiography is a painless test that uses sound waves to create images of your heart. It provides your doctor with information about the size and shape of your heart and how well your heart's chambers and valves are working. This procedure takes approximately one hour. There are no restrictions for this procedure.   You do not need to  schedule a follow-up appointment with Dr Shirlee Latch.

## 2012-03-03 NOTE — Assessment & Plan Note (Signed)
ECG is abnormal with anteroseptal Qs and ST depression inferiorly and in the anterolateral leads as well.  I reviewed her prior 2 ECGs which look quite similar.  She has not exertional symptoms.  I will get an echocardiogram.  If this is normal, no further workup needed.  No definite indication for stress test.

## 2012-03-03 NOTE — Assessment & Plan Note (Signed)
Possible premature beats.  These seem to have completely resolved since stopping amitriptyline.  No further workup unless symptoms recur.

## 2012-03-03 NOTE — Progress Notes (Signed)
PCP: Dr. Alwyn Ren  78 yo with history of HTN presents for evaluation of palpitations and abnormal ECG.  For about a year, Alicia Boyd had noted her heart fluttering/flip-flopping occasionally.  Earlier this year, she had also started to note numbness/paresthesias in her fingers and the palpitations worsened.  No lightheadedness, no syncope.  She has had 2 falls recently but both were mechanical (tripped).  She saw Dr. Alwyn Ren due to these concerns.  He stopped her amitriptyline and referred her here for evaluation.  Since stopping the amitriptyline (which she was on for bladder spasm), the paresthesias have resolved and the palpitations have also completely resolved.  TSH and free T4 were normal.   At baseline, she has good exercise tolerance.  She walks 1.5 miles 3 times a week.  No chest pain or tightness.  No significant exertional dyspnea.  BP is high today and has been running in the 140s systolic when she checks at home.   She has an abnormal ECG with possible old ASMI as well as < 1 mm ST depression in both the inferior and anterolateral leads. I reviewed her 2 prior ECGs which her actually quite similar.   Labs (6/12): LDL 129, HDL 85 Labs (1/13): K 4.9, creatinine 0.4, Mg 1.9, free T4 normal, TSH normal, HCT 37.3  PMH: 1. BPPV 2. HTN 3. Paresthesias possibly related to amitriptyline.  4. Normal stress test 3-4 years ago.  5. Bladder spasms  SH: Nonsmoker.  Lives in [place], married.  Had 1 son who died at 29.   FH: No CAD.   ROS: All systems reviewed and negative except as per HPI.    Current Outpatient Prescriptions  Medication Sig Dispense Refill  . amLODipine (NORVASC) 5 MG tablet TAKE 1 TABLET BY MOUTH DAILY  90 tablet  3  . aspirin 81 MG tablet Take 81 mg by mouth daily.        . clindamycin (CLEOCIN) 150 MG capsule Take 150 mg by mouth as directed.      . fexofenadine (ALLEGRA) 180 MG tablet Take 180 mg by mouth daily.        . metoprolol succinate (TOPROL-XL) 25 MG 24 hr  tablet TAKE 1 TABLET BY MOUTH DAILY  90 tablet  3  . Multiple Vitamin (MULTIVITAMIN) capsule Take 1 capsule by mouth daily.        . OPTIVAR 0.05 % ophthalmic solution PLACE 1 DROP INTO AFFECTED EYE TWICE DAILY  6 mL  3  . DISCONTD: hydrochlorothiazide 25 MG tablet Take 0.5 tablets (12.5 mg total) by mouth daily.  45 tablet  3  . hydrochlorothiazide (HYDRODIURIL) 25 MG tablet Take 1 tablet (25 mg total) by mouth daily.  30 tablet  6    BP 156/90  Pulse 87  Ht 5\' 4"  (1.626 m)  Wt 137 lb (62.143 kg)  BMI 23.52 kg/m2 General: NAD Neck: No JVD, no thyromegaly or thyroid nodule.  Lungs: Clear to auscultation bilaterally with normal respiratory effort. CV: Nondisplaced PMI.  Heart regular S1/S2, no S3/S4, no murmur.  No peripheral edema.  No carotid bruit.  Normal pedal pulses.  Abdomen: Soft, nontender, no hepatosplenomegaly, no distention.  Skin: Intact without lesions or rashes.  Neurologic: Alert and oriented x 3.  Psych: Normal affect. Extremities: No clubbing or cyanosis.  HEENT: Normal.

## 2012-03-04 ENCOUNTER — Other Ambulatory Visit: Payer: Self-pay

## 2012-03-04 DIAGNOSIS — R9431 Abnormal electrocardiogram [ECG] [EKG]: Secondary | ICD-10-CM

## 2012-03-04 DIAGNOSIS — R002 Palpitations: Secondary | ICD-10-CM

## 2012-03-04 MED ORDER — HYDROCHLOROTHIAZIDE 25 MG PO TABS: 25.0000 mg | ORAL_TABLET | Freq: Every day | ORAL | Status: DC

## 2012-03-13 ENCOUNTER — Telehealth: Payer: Self-pay | Admitting: Cardiology

## 2012-03-13 NOTE — Telephone Encounter (Signed)
Pt is due for BMP 2 weeks after appt with Dr. Shirlee Latch on March 03, 2012.  She will have lab work done when here for echo on March 16, 2012.  I spoke with pt and gave her this information.

## 2012-03-13 NOTE — Telephone Encounter (Signed)
New Msg: Pt calling wanting to know if she needs to get her potassium checked when pt comes into office for ECHO. Please return pt call to discuss further.

## 2012-03-16 ENCOUNTER — Ambulatory Visit (HOSPITAL_COMMUNITY): Payer: MEDICARE | Attending: Cardiology

## 2012-03-16 ENCOUNTER — Other Ambulatory Visit (INDEPENDENT_AMBULATORY_CARE_PROVIDER_SITE_OTHER): Payer: MEDICARE

## 2012-03-16 ENCOUNTER — Other Ambulatory Visit: Payer: Self-pay

## 2012-03-16 DIAGNOSIS — R9431 Abnormal electrocardiogram [ECG] [EKG]: Secondary | ICD-10-CM

## 2012-03-16 DIAGNOSIS — I359 Nonrheumatic aortic valve disorder, unspecified: Secondary | ICD-10-CM

## 2012-03-16 DIAGNOSIS — I10 Essential (primary) hypertension: Secondary | ICD-10-CM | POA: Diagnosis not present

## 2012-03-16 DIAGNOSIS — R002 Palpitations: Secondary | ICD-10-CM | POA: Insufficient documentation

## 2012-03-16 DIAGNOSIS — I059 Rheumatic mitral valve disease, unspecified: Secondary | ICD-10-CM

## 2012-03-16 LAB — BASIC METABOLIC PANEL
BUN: 11 mg/dL (ref 6–23)
CO2: 29 mEq/L (ref 19–32)
Calcium: 9.4 mg/dL (ref 8.4–10.5)
Creatinine, Ser: 0.4 mg/dL (ref 0.4–1.2)
Glucose, Bld: 91 mg/dL (ref 70–99)

## 2012-04-08 ENCOUNTER — Ambulatory Visit (INDEPENDENT_AMBULATORY_CARE_PROVIDER_SITE_OTHER): Payer: MEDICARE | Admitting: Internal Medicine

## 2012-04-08 ENCOUNTER — Encounter: Payer: Self-pay | Admitting: Internal Medicine

## 2012-04-08 VITALS — BP 138/80 | HR 79 | Temp 98.1°F | Wt 136.6 lb

## 2012-04-08 DIAGNOSIS — R209 Unspecified disturbances of skin sensation: Secondary | ICD-10-CM | POA: Diagnosis not present

## 2012-04-08 DIAGNOSIS — R200 Anesthesia of skin: Secondary | ICD-10-CM

## 2012-04-08 DIAGNOSIS — G5600 Carpal tunnel syndrome, unspecified upper limb: Secondary | ICD-10-CM | POA: Diagnosis not present

## 2012-04-08 NOTE — Progress Notes (Signed)
  Subjective:    Patient ID: Alicia Boyd, female    DOB: 10/16/1930, 78 y.o.   MRN: [id number]  HPI  She describes intermittent numbness of the forearms and hands over the last 6 months. This is typically in the morning and improves with massage. It can last up to hours. This typically occurs 3 or 4 times a week On 04/06/12 she had some numbness in her feet upon awakening. She has no history of wrist or ankle fractures Thyroid function tests were normal on 01/27/12. Her cardiologist increased her hydrochlorothiazide to 25 mg daily; potassium was 3.6 on 3/18.  Labs reveal no anemia at this time. Her hematocrit was 32 in June of 2011. No macrocytosis is present    Review of Systems She does have some visual changes which she describes as "fans going around" in the right eye; she has a followup appointment with her Ophthalmologist. She denies blurred vision, double vision, or loss of vision.  She is not having significant palpitations, fatigue, constipation, or diarrhea. She also  denies any tremor , unexplained weight loss, or changes or hair/skin, or nails.     Objective:   Physical Exam   Gen.: Thin but well-nourished; in no acute distress Eyes: Extraocular motion intact; no lid lag or proptosis Neck: Full range of motion; thyroid is not enlarged; no nodules present Heart: Normal rhythm and rate without significant murmur, gallop.S4 with slurring at  LSB Lungs: Chest clear to auscultation without rales,rales, wheezes  Neuro:Deep tendon reflexes are equal and within normal limits; no tremor . Tinel sign negative bilaterally Skin: Warm and dry without significant lesions or rashes; no onycholysis Psych: Normally communicative and interactive; no abnormal mood or affect clinically.         Assessment & Plan:  #1 symptoms of bilateral carpal tunnel syndrome in the context of normal thyroid function. Wrist splints will be prescribed. If symptoms persist nerve conduction test/EMG would be  indicated.  #2 isolated paresthesias of the feet; possible tarsal tunnel variant.

## 2012-04-08 NOTE — Patient Instructions (Addendum)
To increase  Potassium (K+) increase citrus fruits & bananas in diet and use the salt substitute No Salt, which contains  potassium , to season food @ the table.  Go to Web M[employer] for information on Carpal Tunnel Syndrome. If symptoms persist or progress; nerve conduction/EMG studies would be indicated. If these were abnormal, Hand Surgery referral would be indicated.

## 2012-04-10 DIAGNOSIS — H269 Unspecified cataract: Secondary | ICD-10-CM | POA: Insufficient documentation

## 2012-04-10 DIAGNOSIS — G43109 Migraine with aura, not intractable, without status migrainosus: Secondary | ICD-10-CM

## 2012-04-10 DIAGNOSIS — G43809 Other migraine, not intractable, without status migrainosus: Secondary | ICD-10-CM | POA: Diagnosis not present

## 2012-04-10 HISTORY — DX: Migraine with aura, not intractable, without status migrainosus: G43.109

## 2012-04-30 ENCOUNTER — Other Ambulatory Visit: Payer: Self-pay | Admitting: Internal Medicine

## 2012-06-15 ENCOUNTER — Encounter: Payer: MEDICARE | Admitting: Internal Medicine

## 2012-06-15 DIAGNOSIS — J029 Acute pharyngitis, unspecified: Secondary | ICD-10-CM | POA: Diagnosis not present

## 2012-06-23 ENCOUNTER — Encounter: Payer: Self-pay | Admitting: Internal Medicine

## 2012-06-23 ENCOUNTER — Ambulatory Visit (INDEPENDENT_AMBULATORY_CARE_PROVIDER_SITE_OTHER): Payer: MEDICARE | Admitting: Internal Medicine

## 2012-06-23 VITALS — BP 122/78 | HR 84 | Temp 98.4°F | Resp 12 | Ht 63.5 in | Wt 132.6 lb

## 2012-06-23 DIAGNOSIS — M810 Age-related osteoporosis without current pathological fracture: Secondary | ICD-10-CM

## 2012-06-23 DIAGNOSIS — Z0000 Encounter for general adult medical examination without abnormal findings: Secondary | ICD-10-CM | POA: Diagnosis not present

## 2012-06-23 DIAGNOSIS — E785 Hyperlipidemia, unspecified: Secondary | ICD-10-CM | POA: Diagnosis not present

## 2012-06-23 DIAGNOSIS — D649 Anemia, unspecified: Secondary | ICD-10-CM | POA: Diagnosis not present

## 2012-06-23 DIAGNOSIS — I10 Essential (primary) hypertension: Secondary | ICD-10-CM

## 2012-06-23 LAB — TSH: TSH: 1.11 u[IU]/mL (ref 0.35–5.50)

## 2012-06-23 LAB — LIPID PANEL
HDL: 84.3 mg/dL (ref >39.00)
VLDL: 16.2 mg/dL (ref 0.0–40.0)

## 2012-06-23 LAB — CBC WITH DIFFERENTIAL/PLATELET
Basophils Relative: 0.5 % (ref 0.0–3.0)
Eosinophils Absolute: 0.1 10*3/uL (ref 0.0–0.7)
Hemoglobin: 12.8 g/dL (ref 12.0–15.0)
Lymphocytes Relative: 13.7 % (ref 12.0–46.0)
MCHC: 33.3 g/dL (ref 30.0–36.0)
Monocytes Relative: 5.2 % (ref 3.0–12.0)
Neutrophils Relative %: 79 % — ABNORMAL HIGH (ref 43.0–77.0)

## 2012-06-23 LAB — BASIC METABOLIC PANEL
BUN: 10 mg/dL (ref 6–23)
CO2: 31 mEq/L (ref 19–32)
Chloride: 96 mEq/L (ref 96–112)
Glucose, Bld: 93 mg/dL (ref 70–99)
Potassium: 3.3 mEq/L — ABNORMAL LOW (ref 3.5–5.1)
Sodium: 134 mEq/L — ABNORMAL LOW (ref 135–145)

## 2012-06-23 LAB — HEPATIC FUNCTION PANEL
Alkaline Phosphatase: 58 U/L (ref 39–117)
Bilirubin, Direct: 0.1 mg/dL (ref 0.0–0.3)
Total Protein: 6.3 g/dL (ref 6.0–8.3)

## 2012-06-23 NOTE — Progress Notes (Signed)
Subjective:    Patient ID: Alicia Boyd, female    DOB: 12-05-1930, 78 y.o.   MRN: [id number]  HPI Medicare Wellness Visit:  The following psychosocial & medical history were reviewed as required by Medicare.   Social history: caffeine: 1 cup /day , alcohol:  no ,  tobacco use : never  & exercise : walk 1 mpd.   Home & personal  safety / fall risk: no issues, activities of daily living: no limitations , seatbelt use :yes , and smoke alarm employment :yes .  Power of Attorney/Living Will status : in place  Vision ( as recorded per Nurse) & Hearing  evaluation :  Ophth exam 11/12 negative; no hearing evaluation. Orientation :oriented X 3 , memory & recall :good,  math testing: good,and mood & affect : normal . Depression / anxiety: denied Travel history : [place] , immunization status :Shingles needed , transfusion history:  no, and preventive health surveillance ( colonoscopies, BMD , etc as per protocol/ [facility]): colonoscopy up to date, Dental care:  Every 6 mos . Chart reviewed &  Updated. Active issues reviewed & addressed.       Review of Systems CHRONIC HYPERTENSION: Disease Monitoring  Blood pressure range: < 144/90  Chest pain: no   Dyspnea: no   Claudication:no   Medication compliance: yes  Medication Side Effects  Lightheadedness:no  Urinary frequency:no   Edema: no   Preventitive Healthcare:    Diet Pattern: low sugar & fat  Salt Restriction: yes  She has a past history of reflux as well as anemia. In June 2011 hematocrit was 32. She denies unexplained weight loss, significant dyspepsia, dysphagia, melena, rectal bleeding.       Objective:   Physical Exam Gen.: Thin but healthy and well-nourished in appearance. Alert, appropriate and cooperative throughout exam. Head: Normocephalic without obvious abnormalities Eyes: No corneal or conjunctival inflammation noted. Extraocular motion intact. Vision grossly normal. Ears: External  ear exam reveals no  significant lesions or deformities. Canals clear .TMs normal. Hearing is grossly normal bilaterally. Nose: External nasal exam reveals no deformity or inflammation. Nasal mucosa are pink and moist. No lesions or exudates noted. Mouth: Oral mucosa and oropharynx reveal no lesions or exudates. Teeth in good repair. Neck: No deformities, masses, or tenderness noted. Range of motion & Thyroid normal Lungs: Normal respiratory effort; chest expands symmetrically. Lungs are clear to auscultation without rales, wheezes, or increased work of breathing. Heart: Normal rate and rhythm. Normal S1 and S2. No gallop, click, or rub. S4 w/o murmur. Abdomen: Bowel sounds normal; abdomen soft and nontender. 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                                                                                   Musculoskeletal/extremities: No deformity or scoliosis noted of  the thoracic or lumbar spine; but there is some asymmetry of the posterior thoracic musculature suggesting occult scoliosis. . No clubbing, cyanosis, edema noted. Range of motion  normal .Tone & strength  Normal.Joints:OA hand changes. Nail health  good. Vascular: Carotid, radial artery, dorsalis pedis and  posterior tibial pulses are full and equal. No bruits present. Neurologic: Alert and oriented x3. Deep tendon reflexes symmetrical and normal.         Skin: Intact without suspicious lesions or rashes. Lymph: No cervical, axillary lymphadenopathy present. Psych: Mood and affect are normal. Normally interactive                                                                                         Assessment & Plan:  #1 Medicare Wellness Exam; criteria met ; data entered #2 Problem List reviewed ; Assessment/ Recommendations made Plan: see Orders

## 2012-06-23 NOTE — Patient Instructions (Addendum)
Preventive Health Care: Exercise  30-45  minutes a day, 3-4 days a week. Walking is especially valuable in preventing Osteoporosis. Eat a low-fat diet with lots of fruits and vegetables, up to 7-9 servings per day.Consume less than 30 grams of sugar per day from foods & drinks with High Fructose Corn Syrup as # 1,2,3 or #4 on label.  

## 2012-06-24 LAB — VITAMIN D 25 HYDROXY (VIT D DEFICIENCY, FRACTURES): Vit D, 25-Hydroxy: 60 ng/mL (ref 30–89)

## 2012-06-30 ENCOUNTER — Encounter: Payer: Self-pay | Admitting: *Deleted

## 2012-06-30 ENCOUNTER — Ambulatory Visit (INDEPENDENT_AMBULATORY_CARE_PROVIDER_SITE_OTHER): Payer: MEDICARE | Admitting: Internal Medicine

## 2012-06-30 ENCOUNTER — Encounter: Payer: Self-pay | Admitting: Internal Medicine

## 2012-06-30 VITALS — BP 114/68 | HR 84 | Wt 132.0 lb

## 2012-06-30 DIAGNOSIS — J069 Acute upper respiratory infection, unspecified: Secondary | ICD-10-CM

## 2012-06-30 MED ORDER — FLUTICASONE PROPIONATE 50 MCG/ACT NA SUSP: NASAL | Status: DC

## 2012-06-30 NOTE — Progress Notes (Signed)
  Subjective:    Patient ID: Alicia Boyd, female    DOB: [birth year], 78 y.o.   MRN: [id number]  HPI  Respiratory tract infection Onset/symptoms: 2 weeks ago, post nasal drainage Exposures (illness/environmental/extrinsic): passenger on plane coughing Progression of symptoms: worsening since 6/30 Treatments/response: Allegra with no response Present symptoms: "congested throat" Fever/chills/sweats: sweats last night Frontal headache: no Facial pain: no Nasal purulence: clear, especially in am Sore throat: mild, especially in am Dental pain: no Lymphadenopathy: no Wheezing/shortness of breath: no Cough/sputum/hemoptysis: no Pleuritic pain: no Associated extrinsic/allergic symptoms:itchy eyes/ sneezing: yes Past medical history: Seasonal allergies/asthma: seasonal allergies Smoking history:never           Review of Systems Her sodium was 134 and potassium 3.3 on 25 mg of HCTZ. The dose was increased from 12.5 mg daily to 25 mg by her cardiologist.     Objective:   Physical Exam General appearance:thin but well nourished; no acute distress or increased work of breathing is present.  No  lymphadenopathy about the head, neck, or axilla noted.   Eyes: No conjunctival inflammation or lid edema is present.   Ears:  External ear exam shows no significant lesions or deformities.  Otoscopic examination reveals clear canals, tympanic membranes are intact bilaterally without bulging, retraction, inflammation or discharge.  Nose:  External nasal examination shows no deformity or inflammation. Nasal mucosa are pink and moist without lesions or exudates. No septal dislocation or deviation.No obstruction to airflow.   Oral exam: Dental hygiene is good; lips and gums are healthy appearing.There is no oropharyngeal erythema or exudate noted.     Heart:  Normal rate and regular rhythm. S1 and S2 normal without gallop, murmur, click, rub . S 4 Lungs:Chest clear to auscultation; no  wheezes, rhonchi,rales ,or rubs present.No increased work of breathing.    Extremities:  No cyanosis, edema, or clubbing  noted    Skin: Warm & dry           Assessment & Plan:  #1 URI w/o sinusitis Plan: See orders and recommendations

## 2012-06-30 NOTE — Patient Instructions (Addendum)
Decrease HCTZ 25 mg one half pill daily and monitor blood pressure.Blood Pressure Goal  Ideally is an AVERAGE < 135/85. This AVERAGE should be calculated from @ least 5-7 BP readings taken @ different times of day on different days of week. You should not respond to isolated BP readings , but rather the AVERAGE for that week . Please  schedule fasting Labs : BMETin 4 weeks. PLEASE BRING THESE INSTRUCTIONS TO FOLLOW UP  LAB APPOINTMENT.This will guarantee correct labs are drawn, eliminating need for repeat blood sampling ( needle sticks ! ). Diagnoses /Codes: 276.8; 401.9  Plain Mucinex for thick secretions ;force NON dairy fluids . Use a Neti pot daily as needed for sinus congestion; going from open side to congested side . Nasal cleansing in the shower as discussed. Make sure that all residual soap is removed to prevent irritation. Plain Allegra 160 daily or Zyrtec @ bedtime  as needed for itchy eyes & sneezing.Zicam Melts or Zinc lozenges ; vitamin C 2000 mg daily; & Echinacea for 4-7 days. Report fever, exudate("pus") or progressive pain.

## 2012-07-10 ENCOUNTER — Ambulatory Visit (INDEPENDENT_AMBULATORY_CARE_PROVIDER_SITE_OTHER): Payer: MEDICARE | Admitting: Internal Medicine

## 2012-07-10 ENCOUNTER — Encounter: Payer: Self-pay | Admitting: Internal Medicine

## 2012-07-10 VITALS — BP 132/80 | HR 78 | Temp 98.6°F | Wt 134.0 lb

## 2012-07-10 DIAGNOSIS — J40 Bronchitis, not specified as acute or chronic: Secondary | ICD-10-CM | POA: Diagnosis not present

## 2012-07-10 MED ORDER — BENZONATATE 100 MG PO CAPS: 100.0000 mg | ORAL_CAPSULE | Freq: Three times a day (TID) | ORAL | Status: AC | PRN

## 2012-07-10 MED ORDER — DOXYCYCLINE HYCLATE 100 MG PO TABS: 100.0000 mg | ORAL_TABLET | Freq: Two times a day (BID) | ORAL | Status: AC

## 2012-07-10 NOTE — Patient Instructions (Addendum)
Rest, fluids , tylenol For cough, take tessalon perles as needed  Take the antibiotic as prescribed  (doxycycline) Call if no better in few days Call anytime if the symptoms are severe

## 2012-07-10 NOTE — Progress Notes (Signed)
  Subjective:    Patient ID: Alicia Boyd, female    DOB: Sep 10, 1930, 78 y.o.   MRN: [id number]  HPI Acute visit 3 weeks history of throat and chest congestion as well as some cough. Symptoms developed while she was visiting [place]. For the last few days she has a very small amount of white and yellow sputum. No hemoptysis. Husband also has respiratory symptoms.  Past Medical History  Diagnosis Date  . Postmenopausal     No PMH or HRT  . Interstitial cystitis     Resolved  . 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 Aortic heart murmur     AS/AI on 2D ECHO ; SBE prophylaxis Rxed  . Hyperlipidemia   . Chest pain 2007 ; 2011    negative cardiac assessment     Review of Systems No fever chills No nausea, vomiting, diarrhea. No chest pain, shortness of breath. No sinus congestion or postnasal dripping but she does have some chest congestion. Patient is a nonsmoker, has no asthma.    Objective:   Physical Exam  General -- alert, well-developed HEENT -- TMs normal, throat w/o redness, face symmetric and not tender to palpation, nose not congested  Lungs -- normal respiratory effort, no intercostal retractions, no accessory muscle use; few rhonchi with cough at bases Heart-- normal rate, regular rhythm, no murmur, and no gallop.   Psych-- Cognition and judgment appear intact. Alert and cooperative with normal attention span and concentration.  not anxious appearing and not depressed appearing.      Assessment & Plan:   Respiratory symptoms most likely from bronchitis. She has several antibiotic allergies, will prescribe doxycycline. She is also intolerant to Robitussin, recommend to try Tessalon Perles if needed. See instructions.

## 2012-07-14 ENCOUNTER — Telehealth: Payer: Self-pay | Admitting: Internal Medicine

## 2012-07-14 MED ORDER — METOPROLOL SUCCINATE ER 25 MG PO TB24: ORAL_TABLET | ORAL | Status: DC

## 2012-07-14 NOTE — Telephone Encounter (Signed)
RX sent

## 2012-07-14 NOTE — Telephone Encounter (Signed)
Refill: Metoprolol succ er 25mg  tab. Take 1 tablet by mouth daily. Qty 90. Last fill 04-17-12

## 2012-08-06 ENCOUNTER — Other Ambulatory Visit (INDEPENDENT_AMBULATORY_CARE_PROVIDER_SITE_OTHER): Payer: MEDICARE

## 2012-08-06 DIAGNOSIS — E876 Hypokalemia: Secondary | ICD-10-CM

## 2012-08-06 DIAGNOSIS — I10 Essential (primary) hypertension: Secondary | ICD-10-CM

## 2012-08-06 LAB — BASIC METABOLIC PANEL
GFR: 178.71 mL/min (ref >60.00)
Glucose, Bld: 85 mg/dL (ref 70–99)
Potassium: 3.7 mEq/L (ref 3.5–5.1)
Sodium: 135 mEq/L (ref 135–145)

## 2012-08-06 NOTE — Progress Notes (Signed)
Lab only 

## 2012-09-10 ENCOUNTER — Ambulatory Visit (INDEPENDENT_AMBULATORY_CARE_PROVIDER_SITE_OTHER): Payer: MEDICARE | Admitting: Internal Medicine

## 2012-09-10 ENCOUNTER — Encounter: Payer: Self-pay | Admitting: Internal Medicine

## 2012-09-10 VITALS — BP 128/70 | HR 76 | Temp 98.4°F | Wt 133.4 lb

## 2012-09-10 DIAGNOSIS — H6980 Other specified disorders of Eustachian tube, unspecified ear: Secondary | ICD-10-CM | POA: Diagnosis not present

## 2012-09-10 DIAGNOSIS — L290 Pruritus ani: Secondary | ICD-10-CM | POA: Diagnosis not present

## 2012-09-10 DIAGNOSIS — H6990 Unspecified Eustachian tube disorder, unspecified ear: Secondary | ICD-10-CM

## 2012-09-10 NOTE — Patient Instructions (Addendum)
Cort Aid over-the-counter applied twice a day as needed for rectal itching. Cleanse initially with tissue followed by Tucks or baby wipes after a bowel movement Plain Mucinex for thick secretions ;force NON dairy fluids . Use a Neti pot daily as needed for sinus congestion; going from open side to congested side . Nasal cleansing in the shower as discussed. Make sure that all residual soap is removed to prevent irritation. Fluticasone 1 spray in each nostril twice a day as needed. Use the "crossover" technique as discussed. Plain Allegra 160 daily as needed for itchy eyes & sneezing.

## 2012-09-10 NOTE — Progress Notes (Signed)
  Subjective:    Patient ID: Alicia Boyd, female    DOB: February 15, 1930, 78 y.o.   MRN: [id number]  HPI  She has had rectal itching for 2-3 months which has not responded to topical Vaseline & baby diaper rash cream w/o benefit. She denies abdominal pain, unexplained weight loss,bowel changes, melena, rectal bleeding. Last colonoscopy 2010 was negative.               Review of Systems Since her flight to [place] in June she's had pressure in the left ear. She also has extrinsic symptoms with itchy watery eyes as well as postnasal drainage. Generic Allegra is employed with some effect. She did not find fluticasone of benefit because of difficulty with its mode of administration.  She does have both seasonal and some intermittent extrinsic rhinoconjunctivitis symptoms. She states  even exposure to some flowers will cause symptoms.  She does not have frontal headache, facial pain, nasal purulence, dental pain, or sore throat. She also is not having cough, sputum production, shortness of breath, wheezing.    Objective:   Physical Exam General appearance:thin but in good health ;well nourished; no acute distress or increased work of breathing is present.  No  lymphadenopathy about the head, neck, or axilla noted.   Eyes: No conjunctival inflammation or lid edema is present.  Ears:  External ear exam shows no significant lesions or deformities.  Otoscopic examination reveals clear canals, tympanic membranes are intact bilaterally without bulging, retraction, inflammation or discharge.  Nose:  External nasal examination shows no deformity or inflammation. Nasal mucosa are pink and moist without lesions or exudates. No septal dislocation or deviation.No obstruction to airflow.   Oral exam: Dental hygiene is good; lips and gums are healthy appearing.There is no oropharyngeal erythema or exudate noted.     Heart:  Normal rate and regular rhythm. S1 and S2 normal without gallop, murmur,  click, rub or other extra sounds.   Lungs:Chest clear to auscultation; no wheezes, rhonchi,rales ,or rubs present.No increased work of breathing. BS decreased    Extremities:  No cyanosis, edema, or clubbing  noted    Skin: Warm & dry           Assessment & Plan:  #1 intermittent rectal itching; colonoscopy is up-to-date. She has no worrisome symptoms or signs.  #2 Eustachian Tube dysfunction, exacerbation after air travel  Plan

## 2012-09-11 ENCOUNTER — Ambulatory Visit: Payer: MEDICARE | Admitting: Internal Medicine

## 2012-09-28 ENCOUNTER — Telehealth: Payer: Self-pay | Admitting: Internal Medicine

## 2012-09-28 NOTE — Telephone Encounter (Signed)
Noted, patient with pending appointment

## 2012-09-28 NOTE — Telephone Encounter (Signed)
Caller: [name]/Patient; Patient Name: Alicia Boyd; PCP: Marga Melnick; Best Callback Phone Number: [phone]. Call regarding hoarse voice. Reports that this comes and goes. Patient has seen Dr. Alwyn Ren on 09/10/12. She is currently taking Nasonex and Allegra as prescribed. Reports feeling sinus drainage at this time. Afebrile. Reports that this weekend she was unable to take the medication prescribed as directed due to being on vacation. Emergent symptom of "Evaluated by provider and no improvement or symptoms worsening when following recommended treatment plan for 48 hours" positive per Sore Throat or Hoarseness guideline. Disposition: See provider within 24 hours. Appointment scheduled with Dr. Alwyn Ren 09/29/12 at 10:00am. Care advice and call back parameters given per guideline. Patient verbalized understanding.

## 2012-09-29 ENCOUNTER — Encounter: Payer: Self-pay | Admitting: Internal Medicine

## 2012-09-29 ENCOUNTER — Ambulatory Visit (INDEPENDENT_AMBULATORY_CARE_PROVIDER_SITE_OTHER): Payer: MEDICARE | Admitting: Internal Medicine

## 2012-09-29 VITALS — BP 128/84 | HR 76 | Temp 98.5°F | Wt 133.6 lb

## 2012-09-29 DIAGNOSIS — Z23 Encounter for immunization: Secondary | ICD-10-CM

## 2012-09-29 DIAGNOSIS — K219 Gastro-esophageal reflux disease without esophagitis: Secondary | ICD-10-CM | POA: Diagnosis not present

## 2012-09-29 DIAGNOSIS — R498 Other voice and resonance disorders: Secondary | ICD-10-CM

## 2012-09-29 DIAGNOSIS — R499 Unspecified voice and resonance disorder: Secondary | ICD-10-CM

## 2012-09-29 DIAGNOSIS — J310 Chronic rhinitis: Secondary | ICD-10-CM | POA: Diagnosis not present

## 2012-09-29 MED ORDER — RANITIDINE HCL 150 MG PO TABS: 150.0000 mg | ORAL_TABLET | Freq: Two times a day (BID) | ORAL | Status: DC

## 2012-09-29 NOTE — Patient Instructions (Addendum)
Plain Mucinex for thick secretions ;force NON dairy fluids . Use a Neti pot daily as needed for sinus congestion; going from open side to congested side . Nasal cleansing in the shower as discussed. Make sure that all residual soap is removed to prevent irritation. Nasanex 1 spray in each nostril twice a day as needed. Use the "crossover" technique as discussed. Plain Allegra 60 , TWO  daily as needed for itchy eyes & sneezing.   The triggers for reflux  include stress; the "aspirin family" ; alcohol; peppermint; and caffeine (coffee, tea, cola, and chocolate). The aspirin family would include aspirin and the nonsteroidal agents such as ibuprofen &  Naproxen. Tylenol would not cause reflux. If having reflux ; food & drink should be avoided for @ least 2 hours before going to bed.

## 2012-09-29 NOTE — Progress Notes (Signed)
  Subjective:    Patient ID: Alicia Boyd, female    DOB: May 27, 1930, 78 y.o.   MRN: [id number]  HPI She describes intermittent laryngitis or hoarseness since flying to [place] in June. She questioned whether she had incurred a respiratory tract infection. She takes low-dose fexofenadine 60 mg to some improvement. She has no significant extrinsic symptoms of itchy eyes and sneezing; but she does have postnasal drainage.  She describes a dry cough and she tries to clear her chest .   Review of Systems She denies fever, chills, frontal headache, facial pain, dental pain, nasal purulence, sore throat. She also has no associated shortness of breath or wheezing.  She does describe some burning in the throat area ; 6 weeks ago she had dysphagia with pork chop.  TSH was 1.11 in June     Objective:   Physical Exam General appearance:thin but in good health ;well nourished; no acute distress or increased work of breathing is present.  No  lymphadenopathy about the head, neck, or axilla noted.   Eyes: No conjunctival inflammation or lid edema is present.   Ears:  External ear exam shows no significant lesions or deformities.  Otoscopic examination reveals clear canals, tympanic membranes are intact bilaterally without bulging, retraction, inflammation or discharge.  Nose:  External nasal examination shows no deformity or inflammation. Nasal mucosa are pink and moist without lesions or exudates. No septal dislocation or deviation.No obstruction to airflow.   Oral exam: Dental hygiene is good; lips and gums are healthy appearing.There is no oropharyngeal erythema or exudate noted.    Heart:  Normal rate and regular rhythm. S1 and S2 normal without gallop, murmur, click, rub or other extra sounds.   Lungs:Chest clear to auscultation; no wheezes, rhonchi,rales ,or rubs present.No increased work of breathing.    Extremities:  No cyanosis, edema, or clubbing  noted    Skin: Warm & dry            Assessment & Plan:  #1 laryngitis; significant extrinsic component of postnasal drainage. Nasal hygiene discussed  #2 one episode of dysphagia; possible occult reflux. Ranitidine trial prescribed

## 2013-01-28 ENCOUNTER — Other Ambulatory Visit: Payer: Self-pay | Admitting: Internal Medicine

## 2013-02-21 ENCOUNTER — Other Ambulatory Visit: Payer: Self-pay | Admitting: Internal Medicine

## 2013-03-17 ENCOUNTER — Ambulatory Visit: Payer: MEDICARE | Admitting: Internal Medicine

## 2013-03-19 ENCOUNTER — Ambulatory Visit (INDEPENDENT_AMBULATORY_CARE_PROVIDER_SITE_OTHER): Payer: MEDICARE | Admitting: Internal Medicine

## 2013-03-19 ENCOUNTER — Encounter: Payer: Self-pay | Admitting: Internal Medicine

## 2013-03-19 VITALS — BP 122/80 | HR 77 | Temp 98.0°F | Wt 134.0 lb

## 2013-03-19 DIAGNOSIS — R490 Dysphonia: Secondary | ICD-10-CM

## 2013-03-19 DIAGNOSIS — G5600 Carpal tunnel syndrome, unspecified upper limb: Secondary | ICD-10-CM | POA: Diagnosis not present

## 2013-03-19 LAB — TSH: TSH: 1.3 u[IU]/mL (ref 0.350–4.500)

## 2013-03-19 MED ORDER — MONTELUKAST SODIUM 10 MG PO TABS: 10.0000 mg | ORAL_TABLET | Freq: Every day | ORAL | Status: DC

## 2013-03-19 NOTE — Progress Notes (Signed)
  Subjective:    Patient ID: Alicia Boyd, female    DOB: 1930/09/17, 79 y.o.   MRN: [id number]  HPI  She has had numbness in her hands upon awakening mainly in the first 3 digits bilaterally for several months. This does improve when she rubs her hands after awakening  For 2 month she's noted some hoarseness and upper chest congestion. This recurred after she stopped her Allegra.    Review of Systems   She specifically denies frontal headache, facial pain, nasal purulence, dental pain, or sore throat. She denies significant itchy, watery eyes or sneezing.  Unfortunately she's been intolerant to Flonase.     Objective:   Physical Exam General appearance:thin but in good health ;well nourished; no acute distress or increased work of breathing is present.  No  lymphadenopathy about the head, neck, or axilla noted.   Eyes: No conjunctival inflammation or lid edema is present.   Ears:  External ear exam shows no significant lesions or deformities.  Otoscopic examination reveals clear canals, tympanic membranes are intact bilaterally without bulging, retraction, inflammation or discharge.  Nose:  External nasal examination shows no deformity or inflammation. Nasal mucosa are pink and moist without lesions or exudates. No septal dislocation or deviation.No obstruction to airflow.   Oral exam: Dental hygiene is good; lips and gums are healthy appearing.There is no oropharyngeal erythema or exudate noted. Slightly hoarse  Neck:  No deformities, thyromegaly, masses, or tenderness noted.  Heart:  Normal rate and regular rhythm. S1 and S2 normal without gallop, murmur, click, rub or other extra sounds.   Lungs:Chest clear to auscultation; no wheezes, rhonchi,rales ,or rubs present.No increased work of breathing.    Extremities:  No cyanosis, edema, or clubbing  noted  . Mixed arthritic changes of the hands, right greater than left.  Positive Tinel's sign bilaterally   Skin: Warm & dry           Assessment & Plan:   #1 hoarseness in the context of extrinsic allergic process  #2 carpal tunnel syndrome  Plan: A trial of Singulair recommended for the upper airway symptoms. Nasal hygiene should be stressed.  Wrists splint unilaterally recommended as a trial

## 2013-03-19 NOTE — Patient Instructions (Addendum)
  Nasal cleansing in the shower as discussed with lather of mild shampoo.After 10 seconds wash off lather while  exhaling through nostrils. Make sure that all residual soap is removed to prevent irritation.  Plain Allegra (NOT D )  160 mg in am & Singulair 10 mg @ bedtime for hoarseness. Use wrist splint on 1 side as trial for  Carpal Tunnel Syndrome. If symptoms persist or progress; nerve conduction/EMG studies would be indicated. If these were abnormal, Hand Surgery referral would be indicated.

## 2013-04-02 ENCOUNTER — Other Ambulatory Visit: Payer: Self-pay | Admitting: Cardiology

## 2013-04-22 ENCOUNTER — Encounter: Payer: Self-pay | Admitting: Internal Medicine

## 2013-04-22 ENCOUNTER — Ambulatory Visit (INDEPENDENT_AMBULATORY_CARE_PROVIDER_SITE_OTHER): Payer: MEDICARE | Admitting: Internal Medicine

## 2013-04-22 VITALS — BP 116/78 | HR 81 | Temp 98.3°F | Wt 135.0 lb

## 2013-04-22 DIAGNOSIS — H6982 Other specified disorders of Eustachian tube, left ear: Secondary | ICD-10-CM

## 2013-04-22 DIAGNOSIS — J3489 Other specified disorders of nose and nasal sinuses: Secondary | ICD-10-CM | POA: Diagnosis not present

## 2013-04-22 DIAGNOSIS — H6980 Other specified disorders of Eustachian tube, unspecified ear: Secondary | ICD-10-CM | POA: Diagnosis not present

## 2013-04-22 DIAGNOSIS — H6990 Unspecified Eustachian tube disorder, unspecified ear: Secondary | ICD-10-CM

## 2013-04-22 LAB — CBC WITH DIFFERENTIAL/PLATELET
Eosinophils Relative: 4.5 % (ref 0.0–5.0)
HCT: 38.4 % (ref 36.0–46.0)
Lymphs Abs: 1.2 10*3/uL (ref 0.7–4.0)
MCV: 93.2 fl (ref 78.0–100.0)
Monocytes Absolute: 0.4 10*3/uL (ref 0.1–1.0)
Platelets: 239 10*3/uL (ref 150.0–400.0)
WBC: 6.4 10*3/uL (ref 4.5–10.5)

## 2013-04-22 NOTE — Progress Notes (Signed)
  Subjective:    Patient ID: Alicia Boyd, female    DOB: 1930-10-24, 79 y.o.   MRN: [id number]  HPI  She describes shooting pain in the left infra-auricular area with radiation to the neck for 2 - 3 days. She's had associated nasal congestion with scant yellow secretions and some self-limited epistaxis. She does have maxillary sinus pressure.  She relates this to flying back from [place], [place] in June 2013.  She does have itchy, watery eyes and sneezing.  She did not find Singulair effective and felt it made her " spacey". She's been intolerant to fluticasone in the past; this caused headache    Review of Systems She denies significant frontal headache, facial pain, dental pain, sore throat, or otic discharge. She has not had fever, chills, or sweats     Objective:   Physical Exam General appearance: thin but in good health ;well nourished; no acute distress or increased work of breathing is present.  No  lymphadenopathy about the head, neck, or axilla noted.   Eyes: No conjunctival inflammation or lid edema is present.   Ears:  External ear exam shows no significant lesions or deformities.  Otoscopic examination reveals clear canals, tympanic membranes are intact bilaterally without bulging, retraction, inflammation or discharge.  TMJ syndrome is not suggested clinically  Tuning fork exam suggest possible lateralization to the left. Air conduction is greater than bone conduction bilaterally.  Nose:  External nasal examination shows no deformity or inflammation. Nasal mucosa are pink but dry without lesions or exudates. No septal dislocation or deviation.No obstruction to airflow.   Oral exam: Dental hygiene is good; lips and gums are healthy appearing.There is no oropharyngeal erythema or exudate noted.   Neck:  No deformities,  masses, or tenderness noted.     Lungs:Chest clear to auscultation; no wheezes, rhonchi,rales ,or rubs present.No increased work of breathing.     Extremities:  No cyanosis, edema, or clubbing  noted    Skin: Warm & dry .         Assessment & Plan:  #1 left infra-auricular pain in the context of air travel. The most likely explanation is eustachian tube dysfunction. She is unable to use inhaled nasal steroids.  #2 extrinsic symptoms with nasal congestion  #3 reported epistaxis; no active bleeding at this time  Plan: CT sinus films recommended along with ENT consultation to evaluate the chronic eustachian tube-type symptoms.

## 2013-04-22 NOTE — Patient Instructions (Addendum)
Go to [facility] for eustachian tube dysfunction. Drink thin  fluids liberally through the day and chew sugarless gum . Do the Valsalva maneuver several times a day to "pop" ears open.

## 2013-04-23 ENCOUNTER — Encounter: Payer: Self-pay | Admitting: General Practice

## 2013-04-27 ENCOUNTER — Ambulatory Visit (INDEPENDENT_AMBULATORY_CARE_PROVIDER_SITE_OTHER)
Admission: RE | Admit: 2013-04-27 | Discharge: 2013-04-27 | Disposition: A | Discharge status: Home / Self Care | Payer: MEDICARE | Source: Ambulatory Visit | Attending: Internal Medicine | Admitting: Internal Medicine

## 2013-04-27 DIAGNOSIS — R6884 Jaw pain: Secondary | ICD-10-CM | POA: Diagnosis not present

## 2013-04-27 DIAGNOSIS — H6982 Other specified disorders of Eustachian tube, left ear: Secondary | ICD-10-CM

## 2013-04-27 DIAGNOSIS — J3489 Other specified disorders of nose and nasal sinuses: Secondary | ICD-10-CM

## 2013-04-27 DIAGNOSIS — H6980 Other specified disorders of Eustachian tube, unspecified ear: Secondary | ICD-10-CM

## 2013-04-27 DIAGNOSIS — H9209 Otalgia, unspecified ear: Secondary | ICD-10-CM | POA: Diagnosis not present

## 2013-04-28 ENCOUNTER — Encounter: Payer: Self-pay | Admitting: *Deleted

## 2013-04-29 DIAGNOSIS — R490 Dysphonia: Secondary | ICD-10-CM | POA: Diagnosis not present

## 2013-04-29 DIAGNOSIS — H9209 Otalgia, unspecified ear: Secondary | ICD-10-CM | POA: Diagnosis not present

## 2013-06-24 ENCOUNTER — Encounter: Payer: Self-pay | Admitting: Internal Medicine

## 2013-06-24 ENCOUNTER — Ambulatory Visit (INDEPENDENT_AMBULATORY_CARE_PROVIDER_SITE_OTHER): Payer: MEDICARE | Admitting: Internal Medicine

## 2013-06-24 VITALS — BP 130/80 | HR 65 | Temp 98.1°F | Ht 64.0 in | Wt 133.0 lb

## 2013-06-24 DIAGNOSIS — E876 Hypokalemia: Secondary | ICD-10-CM | POA: Diagnosis not present

## 2013-06-24 DIAGNOSIS — E785 Hyperlipidemia, unspecified: Secondary | ICD-10-CM | POA: Diagnosis not present

## 2013-06-24 DIAGNOSIS — R9431 Abnormal electrocardiogram [ECG] [EKG]: Secondary | ICD-10-CM

## 2013-06-24 DIAGNOSIS — R7309 Other abnormal glucose: Secondary | ICD-10-CM

## 2013-06-24 DIAGNOSIS — Z0000 Encounter for general adult medical examination without abnormal findings: Secondary | ICD-10-CM

## 2013-06-24 DIAGNOSIS — I10 Essential (primary) hypertension: Secondary | ICD-10-CM | POA: Diagnosis not present

## 2013-06-24 LAB — BASIC METABOLIC PANEL
BUN: 11 mg/dL (ref 6–23)
Calcium: 9.6 mg/dL (ref 8.4–10.5)
Creatinine, Ser: 0.5 mg/dL (ref 0.4–1.2)
GFR: 135.29 mL/min (ref >60.00)
Glucose, Bld: 106 mg/dL — ABNORMAL HIGH (ref 70–99)
Sodium: 134 mEq/L — ABNORMAL LOW (ref 135–145)

## 2013-06-24 LAB — HEPATIC FUNCTION PANEL
Albumin: 4.3 g/dL (ref 3.5–5.2)
Alkaline Phosphatase: 70 U/L (ref 39–117)

## 2013-06-24 LAB — LIPID PANEL
Cholesterol: 227 mg/dL — ABNORMAL HIGH (ref 0–200)
HDL: 89.1 mg/dL (ref >39.00)
VLDL: 16 mg/dL (ref 0.0–40.0)

## 2013-06-24 MED ORDER — SPIRONOLACTONE 25 MG PO TABS: ORAL_TABLET | ORAL | Status: DC

## 2013-06-24 NOTE — Progress Notes (Signed)
Subjective:    Patient ID: Alicia Boyd, female    DOB: 1930-01-07, 79 y.o.   MRN: [id number]  HPI Medicare Wellness Visit:  Psychosocial & medical history were reviewed as required by Medicare (abuse,antisocial behavioral risks,firearm risk).  Social history: caffeine: 2 cups of half caffeinated coffee in the morning  , alcohol: none  ,  tobacco use: never   Exercise : walk 5 days per week, 60 minutes at a time.  Home & personal  safety / fall risk: none Limitations of activities of daily living: none Seatbelt  and smoke alarm use: yes to both Power of Attorney/Living Will status :  Has both documents Ophthalmology exam status : once per year, last January 2014 Hearing evaluation status: never had a formal evaluation Orientation :oriented X 3 Memory & recall : recalls 3/3 Spelling or math testing: spells WORLD backwards correctly Active depression / anxiety: none Foreign travel history : Cruises to [place], [place] in 1970s Immunization status:  Has not had shingles yet.    UTD on Flu/ PNA/ tetanus  Transfusion history:  never Preventive health surveillance status: last colonoscopy May 2013 , mammograms- last 2011, last PAP 2011 Dental care:  Every 6 months Chart reviewed &  Updated. Active issues reviewed & addressed.      Review of Systems CHRONIC HYPERTENSION follow-up:  Home blood pressure range 130's/80's  Patient is compliant with medications- yes  No adverse effects noted from medication- none  No specific dietary program heart healthy- low carb low-fat low-salt diet    Occasional palpitations while doing chores, not while exercising. No chest pain, dyspnea, claudication,edema or paroxysmal nocturnal dyspnea described  No significant lightheadedness, headache, epistaxis, or syncope           Objective:   Physical Exam  Gen.: Healthy and well-nourished in appearance. Alert, appropriate and cooperative throughout exam. Appears younger than  stated age  Head: Normocephalic without obvious abnormalities;  No alopecia  Eyes: No corneal or conjunctival inflammation noted. Pupils equal round reactive to light and accommodation.  Extraocular motion intact. Vision grossly normal with lenses Ears: External  ear exam reveals no significant lesions or deformities. Canals clear .TMs normal. Hearing is grossly normal bilaterally. Nose: External nasal exam reveals no deformity or inflammation. Nasal mucosa are pink and moist. No lesions or exudates noted. Septum  normal  Mouth: Oral mucosa and oropharynx reveal no lesions or exudates. Teeth in good repair. Neck: No deformities, masses, or tenderness noted. Range of motion normal. Thyroid normal. Lungs: Normal respiratory effort; chest expands symmetrically. Lungs are clear to auscultation without rales, wheezes, or increased work of breathing. Heart: Normal rate and rhythm. Normal S1 and S2. No gallop, click, or rub. No murmur. Abdomen: Bowel sounds normal; abdomen soft and nontender. No masses, organomegaly or hernias noted. Aorta palpable ; no AAA                         Musculoskeletal/extremities: There is some asymmetry of the posterior thoracic musculature suggesting occult scoliosis. No clubbing, cyanosis, edema, or significant extremity  deformity noted. Range of motion normal .Tone & strength  Normal. Joints normal / reveal mild  DJD and DIP changes. Nail health good. Able to lie down & sit up w/o help. Negative SLR bilaterally Vascular: Carotid, radial artery, dorsalis pedis and  posterior tibial pulses are full and equal. No bruits present. Neurologic: Alert and oriented x3. Deep tendon reflexes symmetrical and normal.  Gait normal  including  heel & toe walking .        Skin: Intact without suspicious lesions or rashes. Lymph: No cervical, axillary, or inguinal lymphadenopathy present. Psych: Mood and affect are normal. Normally interactive                                                                                       Assessment & Plan:  #1 Medicare Wellness Exam; criteria met ; data entered #2 Problem List/Diagnoses reviewed Plan:  Assessments made/ Orders entered

## 2013-06-24 NOTE — Patient Instructions (Addendum)
Take the EKG to any emergency room or preop visits. There are nonspecific changes; as long as there is no new change these are not clinically significant . If the old EKG is not available for comparison; it may result in unnecessary hospitalization for observation with significant unnecessary expense. 

## 2013-06-25 ENCOUNTER — Encounter: Payer: Self-pay | Admitting: *Deleted

## 2013-06-25 ENCOUNTER — Telehealth: Payer: Self-pay | Admitting: Internal Medicine

## 2013-06-25 NOTE — Telephone Encounter (Signed)
Patient is calling because her pharmacy called her today and informed her that she needed to come pick up her medication that we had sent to her pharmacy spironolactone (ALDACTONE) 25 MG tablet. Patient states that she does not recall Dr. Alwyn Ren telling her that she would be taking any new medications or having anything changed. Patient wants to know what this prescription is for before she picks it up from the pharmacy.

## 2013-06-25 NOTE — Telephone Encounter (Signed)
Per OV notes(06-24-13) and Rx spironolactone was given in the placed of the HCTZ. Marland KitchenLeft message to call office to inform Pt of this

## 2013-06-25 NOTE — Telephone Encounter (Signed)
Per Alfonse Flavors he is worried about Pt potassium level so that is why he changed med. Discuss with patient.

## 2013-06-26 ENCOUNTER — Encounter: Payer: Self-pay | Admitting: Internal Medicine

## 2013-06-29 ENCOUNTER — Other Ambulatory Visit: Payer: Self-pay | Admitting: Internal Medicine

## 2013-07-24 ENCOUNTER — Other Ambulatory Visit: Payer: Self-pay | Admitting: Internal Medicine

## 2013-07-30 ENCOUNTER — Other Ambulatory Visit: Payer: Self-pay | Admitting: Cardiology

## 2013-07-30 NOTE — Telephone Encounter (Signed)
Error

## 2013-08-16 ENCOUNTER — Other Ambulatory Visit: Payer: Self-pay | Admitting: Internal Medicine

## 2013-08-17 NOTE — Telephone Encounter (Signed)
RX was denied from [facility] with notation patient will use [facility] for this rx in the future. RX was already sent to [facility] in July 2014 for a year supply

## 2013-08-17 NOTE — Telephone Encounter (Signed)
Left message on voicemail for patient to call and clarify which pharmacy she would like this medication filled at for we have recently received request from 2 different pharmacies

## 2013-08-17 NOTE — Telephone Encounter (Signed)
Patient would like this sent to [facility] on [address].

## 2013-08-24 ENCOUNTER — Other Ambulatory Visit: Payer: Self-pay | Admitting: *Deleted

## 2013-08-24 ENCOUNTER — Other Ambulatory Visit: Payer: Self-pay | Admitting: Internal Medicine

## 2013-08-24 DIAGNOSIS — I10 Essential (primary) hypertension: Secondary | ICD-10-CM

## 2013-08-24 MED ORDER — SPIRONOLACTONE 25 MG PO TABS: ORAL_TABLET | ORAL | Status: DC

## 2013-08-24 NOTE — Telephone Encounter (Signed)
Rx was refilled for spironolactone 25 mg.  Ag cma

## 2013-08-25 ENCOUNTER — Other Ambulatory Visit: Payer: Self-pay | Admitting: *Deleted

## 2013-08-25 DIAGNOSIS — I10 Essential (primary) hypertension: Secondary | ICD-10-CM

## 2013-08-25 MED ORDER — SPIRONOLACTONE 25 MG PO TABS: ORAL_TABLET | ORAL | Status: DC

## 2013-08-25 NOTE — Telephone Encounter (Signed)
RF for aldactone sent to [facility]

## 2013-08-31 ENCOUNTER — Other Ambulatory Visit: Payer: MEDICARE

## 2013-09-10 ENCOUNTER — Encounter: Payer: Self-pay | Admitting: Internal Medicine

## 2013-09-10 ENCOUNTER — Other Ambulatory Visit: Payer: Self-pay | Admitting: Internal Medicine

## 2013-09-10 ENCOUNTER — Ambulatory Visit (INDEPENDENT_AMBULATORY_CARE_PROVIDER_SITE_OTHER): Payer: Medicare Other | Admitting: Internal Medicine

## 2013-09-10 VITALS — BP 166/75 | HR 77 | Temp 98.8°F | Wt 132.6 lb

## 2013-09-10 DIAGNOSIS — R1319 Other dysphagia: Secondary | ICD-10-CM

## 2013-09-10 DIAGNOSIS — I10 Essential (primary) hypertension: Secondary | ICD-10-CM

## 2013-09-10 MED ORDER — ESOMEPRAZOLE MAGNESIUM 20 MG PO CPDR: 20.0000 mg | DELAYED_RELEASE_CAPSULE | Freq: Every day | ORAL | Status: DC

## 2013-09-10 MED ORDER — SPIRONOLACTONE 25 MG PO TABS: 25.0000 mg | ORAL_TABLET | Freq: Every day | ORAL | Status: DC

## 2013-09-10 NOTE — Addendum Note (Signed)
Addended by: Silvio Pate D on: 09/10/2013 04:25 PM   Modules accepted: Orders

## 2013-09-10 NOTE — Assessment & Plan Note (Signed)
BMET 

## 2013-09-10 NOTE — Patient Instructions (Addendum)
Reflux of gastric acid may be asymptomatic as this may occur mainly during sleep.The triggers for reflux  include stress; the "aspirin family" ; alcohol; peppermint; and caffeine (coffee, tea, cola, and chocolate). The aspirin family would include aspirin and the nonsteroidal agents such as ibuprofen &  Naproxen. Tylenol would not cause reflux. If having symptoms ; food & drink should be avoided for @ least 2 hours before going to bed.    Minimal Blood Pressure Goal= AVERAGE < 140/90;  Ideal is an AVERAGE < 135/85. This AVERAGE should be calculated from @ least 5-7 BP readings taken @ different times of day on different days of week. You should not respond to isolated BP readings , but rather the AVERAGE for that week .Please bring your  blood pressure cuff to office visits to verify that it is reliable.It  can also be checked against the blood pressure device at the pharmacy. Finger or wrist cuffs are not dependable; an arm cuff is. 

## 2013-09-10 NOTE — Progress Notes (Signed)
  Subjective:    Patient ID: Alicia Boyd, female    DOB: 11-18-1930, 79 y.o.   MRN: [id number]  HPI  HYPERTENSION follow-up:  Home blood pressure range 130s-140s/60s-80s  Patient is compliant with medications  No adverse effects noted from medication  No exercise program due husband's illness  On low-salt diet   She had dysphagia with a piece of meat last night. She had not she is feeding well prior to swallowing. She had a similar episode several months ago. She previously been on reflux medicine but not recently.           Review of Systems No chest pain, palpitations, dyspnea, claudication,edema or paroxysmal nocturnal dyspnea described  No significant lightheadedness, headache, epistaxis, or syncope.  She denies abdominal pain Weight loss, melena, or rectal bleeding.      Objective:   Physical Exam Thin but healthy and well-nourished & in no acute distress. Appears younger than stated age  Dental hygiene is excellent. No oral pharyngeal erythema present No carotid bruits are present.No neck pain distention present at 10 - 15 degrees. Thyroid normal to palpation  Heart rhythm and rate are normal with no significant murmurs or gallops.  Chest is clear with no increased work of breathing  There is no evidence of aortic aneurysm or renal artery bruits  Abdomen soft with no organomegaly or masses. No HJR  No clubbing, cyanosis or edema present.  Pedal pulses are intact but slightly decreased  No ischemic skin changes are present . Nails  Healthy   Alert and oriented. Strength, tone, DTRs reflexes normal        Assessment & Plan:  #1 hypertension; home measurements suggest good control. Blood pressure is elevated today but she has a component of whitecoat syndrome  & has been under stress  #2 food dysphagia  Plan: See orders and recommendations

## 2013-09-11 LAB — BASIC METABOLIC PANEL
CO2: 28 mEq/L (ref 19–32)
Glucose, Bld: 88 mg/dL (ref 70–99)
Potassium: 4 mEq/L (ref 3.5–5.3)
Sodium: 137 mEq/L (ref 135–145)

## 2013-09-20 ENCOUNTER — Telehealth: Payer: Self-pay | Admitting: Internal Medicine

## 2013-09-20 ENCOUNTER — Encounter: Payer: Self-pay | Admitting: *Deleted

## 2013-09-20 NOTE — Telephone Encounter (Signed)
Patient came in on 09/10/13 and states that Dr. Alwyn Ren instructed her not to take her spironolactone (ALDACTONE) 25 MG tablet rx until her lab results came back. Patient's results came in on 09/11/13 and she has not been called about whether she should continue her medication. Please advise.

## 2013-09-21 ENCOUNTER — Encounter: Payer: Self-pay | Admitting: *Deleted

## 2013-09-21 ENCOUNTER — Telehealth: Payer: Self-pay | Admitting: *Deleted

## 2013-09-21 NOTE — Telephone Encounter (Signed)
Spoke with pt after she called the office to advise [place] that after starting Aldactone, she has developed "weakness in her legs and generally not feeling well." Patient is questioning if she can stop this medication or have it changed to something different. Patient was originally taking HCTZ 25mg  qd.  Patient's labs were as follows on 09/10/13.  Na  137 K         4.0 BUN   9 Crea 0.57  Do you want to change to something else? Please advise.

## 2013-09-21 NOTE — Telephone Encounter (Signed)
Discontinue spironolactone and monitor blood pressure. Please be seen if blood pressure average is over 140/90

## 2013-09-21 NOTE — Telephone Encounter (Signed)
Spoke with pt, advised of recommendation to stop Spironolactone and monitor B/CP. Pt advised to call the office if B/CP ranges are consistently above 140/90 to call the office

## 2013-09-23 NOTE — Telephone Encounter (Signed)
See telephone note for 09-21-13.  I spoke with the pt and informed her again of Dr. Alwyn Ren recommendation from call on 09-21-13.  Pt stated that she understood and agreed.//AB/CMA

## 2013-10-08 ENCOUNTER — Telehealth: Payer: Self-pay | Admitting: *Deleted

## 2013-10-08 ENCOUNTER — Other Ambulatory Visit: Payer: Self-pay | Admitting: *Deleted

## 2013-10-08 DIAGNOSIS — Z23 Encounter for immunization: Secondary | ICD-10-CM

## 2013-10-08 MED ORDER — INFLUENZA VAC SPLIT QUAD 0.5 ML IM SUSP: 0.5000 mL | Freq: Once | INTRAMUSCULAR | Status: DC

## 2013-10-08 MED ORDER — INFLUENZA VIRUS VACCINE SPLIT IM INJ: 0.5000 mL | INJECTION | Freq: Once | INTRAMUSCULAR | Status: DC

## 2013-10-08 NOTE — Telephone Encounter (Signed)
error 

## 2013-10-11 DIAGNOSIS — L821 Other seborrheic keratosis: Secondary | ICD-10-CM | POA: Diagnosis not present

## 2013-10-11 DIAGNOSIS — D0439 Carcinoma in situ of skin of other parts of face: Secondary | ICD-10-CM | POA: Diagnosis not present

## 2013-10-11 DIAGNOSIS — D485 Neoplasm of uncertain behavior of skin: Secondary | ICD-10-CM | POA: Diagnosis not present

## 2013-10-11 DIAGNOSIS — L570 Actinic keratosis: Secondary | ICD-10-CM | POA: Diagnosis not present

## 2013-10-11 DIAGNOSIS — D045 Carcinoma in situ of skin of trunk: Secondary | ICD-10-CM | POA: Diagnosis not present

## 2013-10-12 ENCOUNTER — Telehealth: Payer: Self-pay | Admitting: *Deleted

## 2013-10-12 ENCOUNTER — Ambulatory Visit (INDEPENDENT_AMBULATORY_CARE_PROVIDER_SITE_OTHER): Payer: MEDICARE | Admitting: *Deleted

## 2013-10-12 NOTE — Telephone Encounter (Signed)
Error

## 2013-10-13 ENCOUNTER — Ambulatory Visit: Payer: Medicare Other

## 2013-12-06 ENCOUNTER — Encounter: Payer: Self-pay | Admitting: Internal Medicine

## 2013-12-06 ENCOUNTER — Ambulatory Visit (INDEPENDENT_AMBULATORY_CARE_PROVIDER_SITE_OTHER): Payer: MEDICARE | Admitting: Internal Medicine

## 2013-12-06 VITALS — BP 160/82 | HR 94 | Temp 98.0°F | Wt 131.0 lb

## 2013-12-06 DIAGNOSIS — J0100 Acute maxillary sinusitis, unspecified: Secondary | ICD-10-CM | POA: Diagnosis not present

## 2013-12-06 MED ORDER — SULFAMETHOXAZOLE-TMP DS 800-160 MG PO TABS: 1.0000 | ORAL_TABLET | Freq: Two times a day (BID) | ORAL | Status: DC

## 2013-12-06 NOTE — Progress Notes (Signed)
   Subjective:    Patient ID: Alicia Boyd, female    DOB: 1930-11-20, 79 y.o.   MRN: [id number]  HPI  She's had head congestion for 2 years; this has been evaluated by an otolaryngologist.  In the last week she's developed frontal and maxillary sinus pain, dental pain, and yellow nasal discharge with some blood-tinged character.? Some low grade fever w/o sweats or chills.  She's had cough with scant yellow sputum as well.  She's been taking Tylenol and using a saline nasal spray.  Recently her blood pressures range from 145-148/69-73. This morning she was rushing and she was taking her husband to another medical appointment.   Review of Systems  She has no extrinsic symptoms of itchy, watery eyes, or sneezing.  She denies shortness of breath or wheezing with the cough.       Objective:   Physical Exam  General appearance:thin but good health ;well nourished; no acute distress or increased work of breathing is present.  No  lymphadenopathy about the head, neck, or axilla noted.   Eyes: No conjunctival inflammation or lid edema is present.   Ears:  External ear exam shows no significant lesions or deformities.  Otoscopic examination reveals clear canals, tympanic membranes are intact bilaterally without bulging, retraction, inflammation or discharge.  Nose:  External nasal examination shows no deformity or inflammation. Nasal mucosa are pink and moist without lesions or exudates. No septal dislocation or deviation.No obstruction to airflow.  Hyponasal speech  Oral exam: Dental hygiene is good; lips and gums are healthy appearing.There is no oropharyngeal erythema or exudate noted.   Neck:  No deformities,  masses, or tenderness noted.     Heart:  Normal rate and regular rhythm. S1 and S2 normal without gallop, murmur, click, rub or other extra sounds.   Lungs:Chest clear to auscultation; no wheezes, rhonchi,rales ,or rubs present.No increased work of breathing.     Extremities:  No cyanosis, edema, or clubbing  noted    Skin: Warm & dry          Assessment & Plan:  #1 sinusitis #2 HTN See Orders

## 2013-12-06 NOTE — Patient Instructions (Addendum)
Plain Mucinex (NOT D) for thick secretions ;force NON dairy fluids .   Nasal cleansing in the shower as discussed with lather of mild shampoo.After 10 seconds wash off lather while  exhaling through nostrils. Make sure that all residual soap is removed to prevent irritation.  Nasacort AQ OTC 1 spray in each nostril twice a day as needed. Use the "crossover" technique into opposite nostril spraying toward opposite ear @ 45 degree angle, not straight up into nostril.  Use a Neti pot daily only  as needed for significant sinus congestion; going from open side to congested side . Plain Allegra (NOT D )  160 daily , Loratidine 10 mg , OR Zyrtec 10 mg @ bedtime  as needed for itchy eyes & sneezing.  If blood pressure average is over 140/90; metoprolol will be increased to 25 mg twice a day.

## 2013-12-28 DIAGNOSIS — D0439 Carcinoma in situ of skin of other parts of face: Secondary | ICD-10-CM | POA: Diagnosis not present

## 2014-01-21 ENCOUNTER — Ambulatory Visit (INDEPENDENT_AMBULATORY_CARE_PROVIDER_SITE_OTHER): Payer: MEDICARE | Admitting: Internal Medicine

## 2014-01-21 ENCOUNTER — Encounter: Payer: Self-pay | Admitting: Internal Medicine

## 2014-01-21 VITALS — BP 157/71 | HR 82 | Temp 98.6°F | Wt 131.2 lb

## 2014-01-21 DIAGNOSIS — I10 Essential (primary) hypertension: Secondary | ICD-10-CM

## 2014-01-21 NOTE — Patient Instructions (Signed)
Minimal Blood Pressure Goal= AVERAGE < 150/90;  Ideal is an AVERAGE < 140/90. This AVERAGE should be calculated from @ least 5-7 BP readings taken @ different times of day on different days of week. You should not respond to isolated BP readings , but rather the AVERAGE for that week .Please bring your  blood pressure cuff to office visits to verify that it is reliable.It  can also be checked against the blood pressure device at the pharmacy. Finger or wrist cuffs are not dependable; an arm cuff is. Increase BP pill to 25 mg two pills daily  if BP > 150/90 on average

## 2014-01-21 NOTE — Progress Notes (Signed)
   Subjective:    Patient ID: Alicia Boyd, female    DOB: [birth year], 80 y.o.   MRN: [id number]  HPI  Presents with concerns over two home blood pressure readings in 180s/80s. Other average readings are 150s/70s. Unsure if cuff was correct with those readings d/t markings left on arms after use.  Compliant with anti hypertemsive medication. No lightheadedness or other adverse medication effect described. Walking 30 minutes/3-5 days per week. Heart healthy diet. Hoarseness in morning, clears throughout day. Is not taking Nexium prn.      Review of Systems  Significant headaches, epistaxis, chest pain, palpitations, exertional dyspnea, claudication, paroxysmal nocturnal dyspnea, or edema absent. Increased venous spiders of ankles.       Objective:   Physical Exam Thin but healthy and well-nourished & in no acute distress  No carotid bruits are present.No neck pain distention present at 10 - 15 degrees. Thyroid normal to palpation  Heart rhythm and rate are normal with no significant murmurs or gallops.Accentuated S2  Chest is clear with no increased work of breathing  Aorta palpable but there is no evidence of aortic aneurysm or renal artery bruits  Abdomen soft with no organomegaly or masses. No HJR  No clubbing, cyanosis or edema present.  Pedal pulses are intact ;decreased PTP.Spiders of ankles  No ischemic skin changes are present . Nails healthy  Alert and oriented. Strength, tone, DTRs reflexes normal          Assessment & Plan:  See Current Assessment & Plan in Problem List under specific Diagnosis

## 2014-01-21 NOTE — Assessment & Plan Note (Signed)
See AVS

## 2014-01-21 NOTE — Progress Notes (Signed)
Pre visit review using our clinic review tool, if applicable. No additional management support is needed unless otherwise documented below in the visit note. 

## 2014-02-02 ENCOUNTER — Telehealth: Payer: Self-pay | Admitting: Internal Medicine

## 2014-02-02 NOTE — Telephone Encounter (Signed)
Relevant patient education mailed to patient.  

## 2014-03-22 ENCOUNTER — Ambulatory Visit (INDEPENDENT_AMBULATORY_CARE_PROVIDER_SITE_OTHER): Payer: MEDICARE | Admitting: Internal Medicine

## 2014-03-22 ENCOUNTER — Encounter: Payer: Self-pay | Admitting: Internal Medicine

## 2014-03-22 ENCOUNTER — Other Ambulatory Visit (INDEPENDENT_AMBULATORY_CARE_PROVIDER_SITE_OTHER): Payer: MEDICARE

## 2014-03-22 VITALS — BP 138/68 | HR 75 | Temp 98.4°F | Resp 14

## 2014-03-22 DIAGNOSIS — T148XXA Other injury of unspecified body region, initial encounter: Secondary | ICD-10-CM

## 2014-03-22 DIAGNOSIS — R209 Unspecified disturbances of skin sensation: Secondary | ICD-10-CM

## 2014-03-22 DIAGNOSIS — I10 Essential (primary) hypertension: Secondary | ICD-10-CM | POA: Diagnosis not present

## 2014-03-22 DIAGNOSIS — R202 Paresthesia of skin: Secondary | ICD-10-CM | POA: Insufficient documentation

## 2014-03-22 DIAGNOSIS — R011 Cardiac murmur, unspecified: Secondary | ICD-10-CM | POA: Insufficient documentation

## 2014-03-22 LAB — CBC WITH DIFFERENTIAL/PLATELET
Basophils Absolute: 0 10*3/uL (ref 0.0–0.1)
Basophils Relative: 0.4 % (ref 0.0–3.0)
Eosinophils Absolute: 0.2 10*3/uL (ref 0.0–0.7)
Eosinophils Relative: 3.7 % (ref 0.0–5.0)
HCT: 36.9 % (ref 36.0–46.0)
HEMOGLOBIN: 12.4 g/dL (ref 12.0–15.0)
LYMPHS ABS: 1.2 10*3/uL (ref 0.7–4.0)
Lymphocytes Relative: 21 % (ref 12.0–46.0)
MCHC: 33.6 g/dL (ref 30.0–36.0)
MCV: 92.3 fl (ref 78.0–100.0)
MONO ABS: 0.4 10*3/uL (ref 0.1–1.0)
Monocytes Relative: 7.1 % (ref 3.0–12.0)
NEUTROS ABS: 4 10*3/uL (ref 1.4–7.7)
Neutrophils Relative %: 67.8 % (ref 43.0–77.0)
PLATELETS: 214 10*3/uL (ref 150.0–400.0)
RBC: 3.99 Mil/uL (ref 3.87–5.11)
RDW: 13.3 % (ref 11.5–14.6)
WBC: 6 10*3/uL (ref 4.5–10.5)

## 2014-03-22 LAB — TSH: TSH: 1.2 u[IU]/mL (ref 0.35–5.50)

## 2014-03-22 NOTE — Progress Notes (Signed)
   Subjective:    Patient ID: Alicia Boyd, female    DOB: [birth year], 80 y.o.   MRN: [id number]  HPI She is here for hypertension follow up ;she  has a diary of BP readings and home cuff with her. She complains that her cuff causes her arm to hurt.  She monitors her BP in the mornings about 10 am and in the afternoons around 3 pm. The highest at-home reading is 163/71, although she says she took it after a stressful event.  The lowest reading is 144/72, with an average reading of 152/74. She walks 30 minutes 3-4 times per week; heart healthy diet is followed.  She describes having intermittent episodes of "wave" sensation coming over her, and within seconds it passes. She questions whether this is related to her metoprolol which she takes in the mornings. She has experienced approximately 5 of these episodes within the last month.      Review of Systems Significant headaches, epistaxis, chest pain, palpitations, exertional dyspnea, claudication, paroxysmal nocturnal dyspnea, or edema absent She is concerned over recent sporadic bruising on her arms; denies injury or trauma.  She also reports constant sensation of numbness in R hand, first 3 fingers. She is R handed; denies injury or trauma. This affects her ability to write.       Objective:   Physical Exam Thin but healthy and well-nourished & in no acute distress.  No carotid bruits are present.No neck pain distention present at 10 - 15 degrees. Thyroid normal to palpation.  Heart rhythm and rate are normal. Accentuated S2; no rub, click or murmurs noted.  Chest is clear with no increased work of breathing.  Aorta palpable but there is no evidence of aortic aneurysm or renal artery bruits.  Abdomen soft with no organomegaly or masses. No HJR.  No clubbing, cyanosis or edema present.  Pedal pulses are intact; decreased PTP.Spiders of ankles, progressive.  No ischemic skin changes are present . Nails healthy. PIP and DIP changes  bilaterally. Scattered bruising Alert and oriented. Strength, tone, gait normal. DTRs WNL     Assessment & Plan:  #1 pseudo uncontrolled blood pressure; her cuff is not reliable  #2 paresthesias of the right thumb, index finger and third finger. There is no historical or clinical suggestion of cervical radiculopathy. 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  #3 bruising due to thin skin , lack of SQ fat & low dose ASA See orders. If the numbness and tingling persist or progresses, EMG/nerve conduction test will be pursued.

## 2014-03-22 NOTE — Patient Instructions (Addendum)
Your next office appointment will be determined based upon review of your pending labs . Those instructions will be transmitted to you  by mail. Minimal Blood Pressure Goal= AVERAGE < 150/90;  Ideal is an AVERAGE < 140/85. This AVERAGE should be calculated from @ least 5-7 BP readings taken @ different times of day on different days of week. You should not respond to isolated BP readings , but rather the AVERAGE for that week .Please bring your  blood pressure cuff to office visits to verify that it is reliable as we did today.It  can also be checked against the blood pressure device at the pharmacy. Finger or wrist cuffs are not dependable; an arm cuff is.A manual cuff may be best option.

## 2014-03-22 NOTE — Progress Notes (Signed)
   Subjective:    Patient ID: Alicia Boyd, female    DOB: 12-Mar-1930, 80 y.o.   MRN: [id number]  HPI She is here for hypertension follow up and has a diary of BP readings and home cuff with her. She complains that her cuff causes her arm to hurt.  She monitors her BP in the mornings about 10 am and in the afternoons around 3 pm. The highest at-home reading is 163/71, although she says she took it after a stressful event.  The lowest reading is 144/72, with an average reading of 152/74. She walks 30 minutes 3-4 times per week; heart healthy diet is followed.  She describes having intermittent episodes of "wave" sensation coming over her, and within seconds it passes. She questions whether this is related to her metoprolol which she takes in the mornings. She has experienced approximately 5 of these episodes within the last month.  She is concerned over recent sporadic bruising on her arms; denies injury or trauma.  She also reports constant sensation of numbness in R hand, first 3 fingers. She is R handed; denies injury or trauma. This affects her ability to write.   Review of Systems Significant headaches, epistaxis, chest pain, palpitations, exertional dyspnea, claudication, paroxysmal nocturnal dyspnea, or edema absent.      Objective:   Physical Exam Thin but healthy and well-nourished & in no acute distress.  No carotid bruits are present.No neck pain distention present at 10 - 15 degrees. Thyroid normal to palpation.  Heart rhythm and rate are normal. Accentuated S2; no rub, click or murmurs noted. Chest is clear with no increased work of breathing. Aorta palpable but there is no evidence of aortic aneurysm or renal artery bruits.  Abdomen soft with no organomegaly or masses. No HJR.  No clubbing, cyanosis or edema present.  Pedal pulses are intact; decreased PTP.Spiders of ankles, progressive.  No ischemic skin changes are present . Nails healthy. PIP and DIP changes bilaterally.    Alert and oriented. Strength, tone, gait normal.      Assessment & Plan:

## 2014-03-25 ENCOUNTER — Encounter: Payer: Self-pay | Admitting: *Deleted

## 2014-05-24 DIAGNOSIS — D2330 Other benign neoplasm of skin of unspecified part of face: Secondary | ICD-10-CM | POA: Diagnosis not present

## 2014-05-24 DIAGNOSIS — D485 Neoplasm of uncertain behavior of skin: Secondary | ICD-10-CM | POA: Diagnosis not present

## 2014-05-24 DIAGNOSIS — L820 Inflamed seborrheic keratosis: Secondary | ICD-10-CM | POA: Diagnosis not present

## 2014-06-06 DIAGNOSIS — D1809 Hemangioma of other sites: Secondary | ICD-10-CM | POA: Diagnosis not present

## 2014-06-06 DIAGNOSIS — H5210 Myopia, unspecified eye: Secondary | ICD-10-CM | POA: Diagnosis not present

## 2014-06-06 DIAGNOSIS — H3561 Retinal hemorrhage, right eye: Secondary | ICD-10-CM | POA: Insufficient documentation

## 2014-06-06 DIAGNOSIS — H2510 Age-related nuclear cataract, unspecified eye: Secondary | ICD-10-CM | POA: Diagnosis not present

## 2014-06-06 DIAGNOSIS — H52229 Regular astigmatism, unspecified eye: Secondary | ICD-10-CM | POA: Diagnosis not present

## 2014-06-06 DIAGNOSIS — H524 Presbyopia: Secondary | ICD-10-CM | POA: Diagnosis not present

## 2014-06-06 DIAGNOSIS — H35039 Hypertensive retinopathy, unspecified eye: Secondary | ICD-10-CM | POA: Diagnosis not present

## 2014-06-28 ENCOUNTER — Ambulatory Visit (INDEPENDENT_AMBULATORY_CARE_PROVIDER_SITE_OTHER): Payer: MEDICARE | Admitting: Internal Medicine

## 2014-06-28 ENCOUNTER — Other Ambulatory Visit (INDEPENDENT_AMBULATORY_CARE_PROVIDER_SITE_OTHER): Payer: MEDICARE

## 2014-06-28 ENCOUNTER — Encounter: Payer: Self-pay | Admitting: Internal Medicine

## 2014-06-28 VITALS — BP 180/88 | HR 75 | Temp 98.3°F | Ht 65.0 in | Wt 128.0 lb

## 2014-06-28 DIAGNOSIS — M810 Age-related osteoporosis without current pathological fracture: Secondary | ICD-10-CM

## 2014-06-28 DIAGNOSIS — I10 Essential (primary) hypertension: Secondary | ICD-10-CM

## 2014-06-28 DIAGNOSIS — Z0000 Encounter for general adult medical examination without abnormal findings: Secondary | ICD-10-CM | POA: Diagnosis not present

## 2014-06-28 DIAGNOSIS — J309 Allergic rhinitis, unspecified: Secondary | ICD-10-CM | POA: Diagnosis not present

## 2014-06-28 LAB — BASIC METABOLIC PANEL
BUN: 11 mg/dL (ref 6–23)
CALCIUM: 9.4 mg/dL (ref 8.4–10.5)
CHLORIDE: 101 meq/L (ref 96–112)
CO2: 30 mEq/L (ref 19–32)
CREATININE: 0.4 mg/dL (ref 0.4–1.2)
GFR: 153.66 mL/min (ref >60.00)
Glucose, Bld: 103 mg/dL — ABNORMAL HIGH (ref 70–99)
Potassium: 3.9 mEq/L (ref 3.5–5.1)
Sodium: 137 mEq/L (ref 135–145)

## 2014-06-28 NOTE — Assessment & Plan Note (Addendum)
Vit D level BMD will be considered

## 2014-06-28 NOTE — Assessment & Plan Note (Signed)

## 2014-06-28 NOTE — Patient Instructions (Addendum)
Your next office appointment will be determined based upon review of your pending labs . Those instructions will be transmitted to you  by mail. Please report any significant change in your symptoms.   Blood Pressure Goal= AVERAGE < 140/90;  Ideal is an AVERAGE < 135/85. This AVERAGE should be calculated from @ least 5-7 BP readings taken @ different times of day on different days of week. You should not respond to isolated BP readings , but rather the AVERAGE for that week .Please bring your  blood pressure cuff to office visits to verify that it is reliable.It  can also be checked against the blood pressure device at the pharmacy. Finger or wrist cuffs are not dependable; an arm cuff is.  Plain Mucinex (NOT D) for thick secretions ;force NON dairy fluids .   Nasal cleansing in the shower as discussed with lather of mild shampoo.After 10 seconds wash off lather while  exhaling through nostrils. Make sure that all residual soap is removed to prevent irritation.  Flonase OR Nasacort AQ 1 spray in each nostril twice a day as needed. Use the "crossover" technique into opposite nostril spraying toward opposite ear @ 45 degree angle, not straight up into nostril.  Use a Neti pot daily only  as needed for significant sinus congestion; going from open side to congested side . Plain Allegra (NOT D )  160 daily , Loratidine 10 mg , OR Zyrtec 10 mg @ bedtime  as needed for itchy eyes & sneezing.

## 2014-06-28 NOTE — Progress Notes (Signed)
Subjective:    Patient ID: Alicia Boyd, female    DOB: 06-Sep-1930, 80 y.o.   MRN: [id number]  HPI [employer]/Medicare Wellness Visit: Psychosocial and medical history were reviewed as required by Medicare (history related to abuse, antisocial behavior , firearm risk). Social history: Caffeine:1/2 mug  , Alcohol:no  , Tobacco use:no Exercise:walking 6 X/week 30 min Personal safety/fall risk:no Limitations of activities of daily living:no Seatbelt/ smoke alarm use:yes Healthcare Power of Attorney/Living Will status: UTD Ophthalmologic exam status:[facility] Ophth Hearing evaluation status:not UTD Orientation: Oriented X 3 Memory and recall: good Spelling  testing: good Depression/anxiety assessment: no Foreign travel history: 2010 [place] Immunization status for influenza/pneumonia/ shingles /tetanus: Shingles needed Transfusion history:no Preventive health care maintenance status: Colonoscopy/BMD/mammogram/Pap as per protocol/standard care: BMD due; Gyn not UTD Dental care:every 6 mos Chart reviewed and updated. Active issues reviewed and addressed as documented below.  Blood pressure range / average : 140s/80s Compliant with anti hypertemsive medication. No lightheadedness or other adverse medication effect described.  A heart healthy /low salt diet is followed.   Family history is neg for CVA/ MI     Review of Systems   Significant headaches, epistaxis, chest pain,  exertional dyspnea, claudication, paroxysmal nocturnal dyspnea, or edema absent.  Rarely has palpitations.         Objective:   Physical Exam Gen.: Thin but healthy and well-nourished in appearance. Alert, appropriate and cooperative throughout exam. Appears younger than stated age  Head: Normocephalic without obvious abnormalities  Eyes: No corneal or conjunctival inflammation noted. Pupils equal round reactive to light and accommodation. Extraocular motion intact.  Ears: External  ear exam reveals no  significant lesions or deformities. Canals clear .TMs normal. Hearing is grossly normal bilaterally. Nose: External nasal exam reveals no deformity or inflammation. Nasal mucosa are pink and moist. No lesions or exudates noted.   Mouth: Oral mucosa and oropharynx reveal no lesions or exudates. Teeth in good repair. Neck: No deformities, masses, or tenderness noted. Range of motion & Thyroid normal. Lungs: Normal respiratory effort; chest expands symmetrically. Lungs are clear to auscultation without rales, wheezes, or increased work of breathing. Heart: Normal rate and rhythm. Accentuated S1. Normal S2. No gallop, click, or rub. No murmur. Abdomen: Bowel sounds normal; abdomen soft and nontender. No masses, organomegaly or hernias noted. Genitalia: as per Gyn                                  Musculoskeletal/extremities: Slightly accentuated curvature of upper thoracic spine. No clubbing, cyanosis, edema, or significant extremity  deformity noted. Range of motion normal .Tone & strength normal. Hand joints mixed PIP /  DJD DIP changes.  Fingernail / toenail health good. Able to lie down & sit up w/o help. Negative SLR bilaterally Vascular: Carotid, radial artery, dorsalis pedis and  posterior tibial pulses are equal. Decreased pedal pulses. No bruits present.Venous spiders. Neurologic: Alert and oriented x3. Deep tendon reflexes symmetrical and normal.  Gait normal.      Skin: Intact without suspicious lesions or rashes. Lymph: No cervical, axillary lymphadenopathy present. Psych: Mood and affect are normal. Normally interactive  Assessment & Plan:  #1 Medicare Wellness Exam; criteria met ; data entered #2 See Current Assessment & Plan in Problem List under specific DiagnosisThe labs will be reviewed and risks and options assessed. Written recommendations will be provided by mail or directly through My  Chart.Further evaluation or change in medical therapy will be directed by those results.

## 2014-06-28 NOTE — Assessment & Plan Note (Signed)
Blood pressure goals reviewed. BMET 

## 2014-06-28 NOTE — Progress Notes (Signed)
Pre visit review using our clinic review tool, if applicable. No additional management support is needed unless otherwise documented below in the visit note. 

## 2014-07-01 LAB — VITAMIN D 1,25 DIHYDROXY
Vitamin D 1, 25 (OH)2 Total: 68 pg/mL (ref 18–72)
Vitamin D3 1, 25 (OH)2: 68 pg/mL

## 2014-08-01 ENCOUNTER — Other Ambulatory Visit: Payer: Self-pay | Admitting: Internal Medicine

## 2014-08-22 DIAGNOSIS — H3560 Retinal hemorrhage, unspecified eye: Secondary | ICD-10-CM | POA: Diagnosis not present

## 2014-08-22 DIAGNOSIS — H35039 Hypertensive retinopathy, unspecified eye: Secondary | ICD-10-CM | POA: Diagnosis not present

## 2014-08-22 DIAGNOSIS — H2510 Age-related nuclear cataract, unspecified eye: Secondary | ICD-10-CM | POA: Diagnosis not present

## 2014-09-16 ENCOUNTER — Other Ambulatory Visit: Payer: Self-pay | Admitting: Internal Medicine

## 2014-09-21 ENCOUNTER — Ambulatory Visit (INDEPENDENT_AMBULATORY_CARE_PROVIDER_SITE_OTHER): Payer: MEDICARE

## 2014-09-21 DIAGNOSIS — Z23 Encounter for immunization: Secondary | ICD-10-CM

## 2014-10-21 ENCOUNTER — Ambulatory Visit (INDEPENDENT_AMBULATORY_CARE_PROVIDER_SITE_OTHER): Payer: MEDICARE | Admitting: Internal Medicine

## 2014-10-21 ENCOUNTER — Encounter: Payer: Self-pay | Admitting: Internal Medicine

## 2014-10-21 VITALS — BP 148/70 | HR 70 | Temp 97.8°F | Wt 129.2 lb

## 2014-10-21 DIAGNOSIS — K5900 Constipation, unspecified: Secondary | ICD-10-CM | POA: Diagnosis not present

## 2014-10-21 NOTE — Patient Instructions (Signed)
Natural interventions to treat or prevent constipation would include drinking to thirst, up to 32 ounces of fluids daily; eating 7-9 servings of fresh fruits or vegetables a day; and increasing roughage in the diet such as whole grains. The OTC  fiber products (Example: Metamucil, etc) can be employed if these natural maneuvers do not correct the issue finally MiraLax every third day as needed is an option.

## 2014-10-21 NOTE — Progress Notes (Signed)
Pre visit review using our clinic review tool, if applicable. No additional management support is needed unless otherwise documented below in the visit note. 

## 2014-10-21 NOTE — Progress Notes (Signed)
   Subjective:    Patient ID: Alicia Boyd, female    DOB: 1930-04-08, 80 y.o.   MRN: [id number]  HPI   She describes intermittent constipation for several years. It will be present for several days at a time. She has intermittent hard stool followed by soft, formed stool every 2-3 days. She does stay well hydrated.  There is no specific intervention on her part.  Colonoscopy was negative in 2011.  Otherwise GI and genitourinary review of systems are negative. Her TSH was therapeutic within the last 7 months.    Review of Systems Unexplained weight loss, abdominal pain, significant dyspepsia, dysphagia, melena, rectal bleeding, or persistently small caliber stools are denied.   Dysuria, pyuria, hematuria, frequency, nocturia or polyuria are denied.     Objective:   Physical Exam   Positive or pertinent findings include: There is a soft S4. There are severe degenerative joint changes of both the PIP and DIP joints.   General appearance :Thin but adequately nourished; in no distress. Eyes: No conjunctival inflammation or scleral icterus is present. Oral exam: Dental hygiene is good. Lips and gums are healthy appearing.There is no oropharyngeal erythema or exudate noted.  Heart:  Normal rate and regular rhythm. S1 and S2 normal without gallop, murmur, click, rub or other extra sounds   Lungs:Chest clear to auscultation; no wheezes, rhonchi,rales ,or rubs present.No increased work of breathing.  Abdomen: bowel sounds normal, soft and non-tender without masses, organomegaly or hernias noted.  No guarding or rebound.  Vascular : all pulses equal ; no bruits present. Skin:Warm & dry.  Intact without suspicious lesions or rashes ; no jaundice or tenting Lymphatic: No lymphadenopathy is noted about the head, neck, axilla            Assessment & Plan:  #1 constipation predominant irritable bowel syndrome  Plan: See after visit summary recommendations

## 2014-11-10 ENCOUNTER — Encounter: Payer: Self-pay | Admitting: Internal Medicine

## 2014-11-10 ENCOUNTER — Ambulatory Visit (INDEPENDENT_AMBULATORY_CARE_PROVIDER_SITE_OTHER): Payer: MEDICARE | Admitting: Internal Medicine

## 2014-11-10 VITALS — BP 150/74 | HR 79 | Temp 98.2°F | Wt 127.8 lb

## 2014-11-10 DIAGNOSIS — J310 Chronic rhinitis: Secondary | ICD-10-CM

## 2014-11-10 DIAGNOSIS — J029 Acute pharyngitis, unspecified: Secondary | ICD-10-CM

## 2014-11-10 NOTE — Progress Notes (Signed)
Pre visit review using our clinic review tool, if applicable. No additional management support is needed unless otherwise documented below in the visit note. 

## 2014-11-10 NOTE — Patient Instructions (Signed)
Plain Mucinex (NOT D) for thick secretions ;force NON dairy fluids .   Nasal cleansing in the shower as discussed with lather of mild shampoo.After 10 seconds wash off lather while  exhaling through nostrils. Make sure that all residual soap is removed to prevent irritation.  Plain Allegra (NOT D )  160 daily , Loratidine 10 mg , OR Zyrtec 10 mg @ bedtime  as needed for itchy eyes & sneezing.  Zicam Melts or Zinc lozenges as per package label for sore throat . Complementary options include  vitamin C 2000 mg daily; & Echinacea for 4-7 days. Report persistent or progressive fever; discolored nasal or chest secretions; or frontal headache or facial  pain.

## 2014-11-10 NOTE — Progress Notes (Signed)
   Subjective:    Patient ID: Alicia Boyd, female    DOB: Jul 09, 1930, 80 y.o.   MRN: [id number]  HPI She developed a sore throat last night in context of rhinitis with clear secretions.   She's also had some cough productive of clear sputum.Additionally she's had some discomfort in the ears with swallowing.  She was exposed to dried flowers yesterday and feels that this might have been a factor  She does have seasonal allergies and takes Allegra as needed. She has a history of Flonase intolerance.  She has no symptoms of rhinosinusitis otherwise.  Review of Systems  Frontal headache, facial pain , nasal purulence, dental pain,  otic discharge denied. No fever , chills or sweats.  Extrinsic symptoms of itchy, watery eyes, sneezing, or angioedema are denied. There is no dyspnea,wheezing or  paroxysmal nocturnal dyspnea.       Objective:   Physical Exam   General appearance:good health ;well nourished; no acute distress or increased work of breathing is present.  No  lymphadenopathy about the head, neck, or axilla noted.   Eyes: No conjunctival inflammation or lid edema is present. There is no scleral icterus. Ptosis bilaterally  Ears:  External ear exam shows no significant lesions or deformities.  Otoscopic examination reveals clear canals, tympanic membranes are intact bilaterally without bulging, retraction, inflammation or discharge.  Nose:  External nasal examination shows no deformity or inflammation. Nasal mucosa are pink and moist without lesions or exudates. No septal dislocation or deviation.No obstruction to airflow.   Oral exam: Dental hygiene is good; lips and gums are healthy appearing.There is mildo oropharyngeal erythema w/o exudate noted.   Neck:  No deformities, thyromegaly, masses, or tenderness noted.   Supple with full range of motion without pain.   Heart:  Normal rate and regular rhythm. S1 and S2 normal without gallop, murmur, click, rub or other extra  sounds.   Lungs:Chest clear to auscultation; no wheezes, rhonchi,rales ,or rubs present.No increased work of breathing.    Extremities:  No cyanosis, edema, or clubbing  noted   Neuro: some head & bilateral UE tremor   Skin: Warm & dry w/o jaundice or tenting.       Assessment & Plan:  #1non  allergic rhinitis with sore throat See orders & AVS

## 2014-12-20 ENCOUNTER — Ambulatory Visit (INDEPENDENT_AMBULATORY_CARE_PROVIDER_SITE_OTHER): Payer: MEDICARE | Admitting: Internal Medicine

## 2014-12-20 ENCOUNTER — Encounter: Payer: Self-pay | Admitting: Internal Medicine

## 2014-12-20 VITALS — BP 150/86 | HR 75 | Temp 98.4°F | Wt 127.2 lb

## 2014-12-20 DIAGNOSIS — J309 Allergic rhinitis, unspecified: Secondary | ICD-10-CM

## 2014-12-20 DIAGNOSIS — R1310 Dysphagia, unspecified: Secondary | ICD-10-CM | POA: Diagnosis not present

## 2014-12-20 MED ORDER — AZITHROMYCIN 250 MG PO TABS: ORAL_TABLET | ORAL | Status: DC

## 2014-12-20 NOTE — Progress Notes (Signed)
   Subjective:    Patient ID: Alicia Boyd, female    DOB: Sep 14, 1930, 80 y.o.   MRN: [id number]  HPI  In the last 2 weeks she's had sinus congestion and throat congestion. She attributes this to postnasal drainage  She does have itchy, watery eyes, and sneezing. She's had scant yellow/pink nasal secretions. She has some discomfort and pain in the maxillaray sinus area  Cough is nonproductive.  She believed that she had a black strip in her left eye associated with being almost matted shut upon awakening one morning.  Unfortunately she's allergic or intolerant to Flonase, Singulair, and Robitussin.    Review of Systems   She denies frontal sinus pain, dental pain, sore throat, otic pain, otic discharge  She is having no shortness of breath or wheezing  She has no fever, chills, or sweats  She did have dysphagia last night followed by the production of clear sputum. She does have dysphagia approximately once a month. She denies significant dyspepsia; she is on Nexium qd.    Objective:   Physical Exam  Pertinent or positive findings include:  She has intermittent head tremor.  General appearance:good health ;well nourished; no acute distress or increased work of breathing is present.  No  lymphadenopathy about the head, neck, or axilla noted.   Eyes: No conjunctival inflammation or lid edema is present. There is no scleral icterus.  Ears:  External ear exam shows no significant lesions or deformities.  Otoscopic examination reveals clear canals, tympanic membranes are intact bilaterally without bulging, retraction, inflammation or discharge.  Nose:  External nasal examination shows no deformity or inflammation. Nasal mucosa are pink and moist without lesions or exudates. No septal dislocation or deviation.No obstruction to airflow.   Oral exam: Dental hygiene is good; lips and gums are healthy appearing.There is no oropharyngeal erythema or exudate noted.   Neck:  No  deformities, thyromegaly, masses, or tenderness noted.   Supple with full range of motion without pain.   Heart:  Normal rate and regular rhythm. S1 and S2 normal without gallop, murmur, click, rub or other extra sounds.   Lungs:Chest clear to auscultation; no wheezes, rhonchi,rales ,or rubs present.No increased work of breathing.    Extremities:  No cyanosis, edema, or clubbing  noted    Skin: Warm & dry w/o jaundice or tenting.       Assessment & Plan:  #1 allergic rhinitis #2 dysphagia See orders & AVS

## 2014-12-20 NOTE — Progress Notes (Signed)
Pre visit review using our clinic review tool, if applicable. No additional management support is needed unless otherwise documented below in the visit note. 

## 2014-12-20 NOTE — Patient Instructions (Addendum)
Reflux of gastric acid may be asymptomatic as this may occur mainly during sleep.The triggers for reflux  include stress; the "aspirin family" ; alcohol; peppermint; and caffeine (coffee, tea, cola, and chocolate). The aspirin family would include aspirin and the nonsteroidal agents such as ibuprofen &  Naproxen. Tylenol would not cause reflux. If having symptoms ; food & drink should be avoided for @ least 2 hours before going to bed. Take the Nexium ,protein pump inhibitor 30 minutes before breakfast and 30 minutes before the evening meal for 2 weeks then go back to once a day  30 minutes before breakfast.GI referral if dysphagia persists as we discussed.   Force NON dairy fluids .   Nasal cleansing in the shower as discussed with lather of mild shampoo.After 10 seconds wash off lather while  exhaling through nostrils. Make sure that all residual soap is removed to prevent irritation.  Plain Allegra (NOT D )  160 daily , Loratidine 10 mg , OR Zyrtec 10 mg @ bedtime  as needed for itchy eyes & sneezing.  Fill the  prescription for Zithromax it there is not dramatic improvement in the next 48-72 hours.

## 2015-01-19 DIAGNOSIS — D2339 Other benign neoplasm of skin of other parts of face: Secondary | ICD-10-CM | POA: Diagnosis not present

## 2015-01-19 DIAGNOSIS — C44310 Basal cell carcinoma of skin of unspecified parts of face: Secondary | ICD-10-CM | POA: Diagnosis not present

## 2015-01-26 DIAGNOSIS — Z4802 Encounter for removal of sutures: Secondary | ICD-10-CM | POA: Diagnosis not present

## 2015-02-27 DIAGNOSIS — H2513 Age-related nuclear cataract, bilateral: Secondary | ICD-10-CM | POA: Diagnosis not present

## 2015-02-27 DIAGNOSIS — H524 Presbyopia: Secondary | ICD-10-CM | POA: Diagnosis not present

## 2015-02-27 DIAGNOSIS — H35033 Hypertensive retinopathy, bilateral: Secondary | ICD-10-CM | POA: Diagnosis not present

## 2015-05-26 ENCOUNTER — Other Ambulatory Visit: Payer: Self-pay

## 2015-05-26 ENCOUNTER — Other Ambulatory Visit: Payer: Self-pay | Admitting: Internal Medicine

## 2015-05-26 MED ORDER — METOPROLOL SUCCINATE ER 25 MG PO TB24: 25.0000 mg | ORAL_TABLET | Freq: Every day | ORAL | Status: DC

## 2015-05-26 NOTE — Telephone Encounter (Signed)
Metoprolol sent to pharm

## 2015-07-06 ENCOUNTER — Encounter: Payer: MEDICARE | Admitting: Internal Medicine

## 2015-07-12 ENCOUNTER — Other Ambulatory Visit (INDEPENDENT_AMBULATORY_CARE_PROVIDER_SITE_OTHER): Payer: MEDICARE

## 2015-07-12 ENCOUNTER — Ambulatory Visit (INDEPENDENT_AMBULATORY_CARE_PROVIDER_SITE_OTHER): Payer: MEDICARE | Admitting: Internal Medicine

## 2015-07-12 ENCOUNTER — Encounter: Payer: Self-pay | Admitting: Internal Medicine

## 2015-07-12 VITALS — BP 140/80 | HR 73 | Temp 98.1°F | Resp 16 | Ht 62.0 in | Wt 128.0 lb

## 2015-07-12 DIAGNOSIS — R739 Hyperglycemia, unspecified: Secondary | ICD-10-CM | POA: Diagnosis not present

## 2015-07-12 DIAGNOSIS — I10 Essential (primary) hypertension: Secondary | ICD-10-CM | POA: Diagnosis not present

## 2015-07-12 DIAGNOSIS — Z23 Encounter for immunization: Secondary | ICD-10-CM

## 2015-07-12 DIAGNOSIS — K219 Gastro-esophageal reflux disease without esophagitis: Secondary | ICD-10-CM | POA: Diagnosis not present

## 2015-07-12 DIAGNOSIS — M810 Age-related osteoporosis without current pathological fracture: Secondary | ICD-10-CM

## 2015-07-12 LAB — CBC WITH DIFFERENTIAL/PLATELET
BASOS ABS: 0 10*3/uL (ref 0.0–0.1)
Basophils Relative: 0.6 % (ref 0.0–3.0)
EOS ABS: 0.2 10*3/uL (ref 0.0–0.7)
Eosinophils Relative: 2.6 % (ref 0.0–5.0)
HCT: 39.6 % (ref 36.0–46.0)
HEMOGLOBIN: 13.1 g/dL (ref 12.0–15.0)
LYMPHS PCT: 18.5 % (ref 12.0–46.0)
Lymphs Abs: 1.2 10*3/uL (ref 0.7–4.0)
MCHC: 33.1 g/dL (ref 30.0–36.0)
MCV: 93.2 fl (ref 78.0–100.0)
Monocytes Absolute: 0.4 10*3/uL (ref 0.1–1.0)
Monocytes Relative: 6.1 % (ref 3.0–12.0)
Neutro Abs: 4.6 10*3/uL (ref 1.4–7.7)
Neutrophils Relative %: 72.2 % (ref 43.0–77.0)
Platelets: 216 10*3/uL (ref 150.0–400.0)
RBC: 4.24 Mil/uL (ref 3.87–5.11)
RDW: 12.9 % (ref 11.5–15.5)
WBC: 6.3 10*3/uL (ref 4.0–10.5)

## 2015-07-12 LAB — TSH: TSH: 1.14 u[IU]/mL (ref 0.35–4.50)

## 2015-07-12 LAB — BASIC METABOLIC PANEL
BUN: 11 mg/dL (ref 6–23)
CO2: 32 meq/L (ref 19–32)
CREATININE: 0.51 mg/dL (ref 0.40–1.20)
Calcium: 9.4 mg/dL (ref 8.4–10.5)
Chloride: 103 mEq/L (ref 96–112)
GFR: 122.5 mL/min (ref >60.00)
GLUCOSE: 96 mg/dL (ref 70–99)
Potassium: 3.8 mEq/L (ref 3.5–5.1)
SODIUM: 140 meq/L (ref 135–145)

## 2015-07-12 LAB — HEMOGLOBIN A1C: Hgb A1c MFr Bld: 5.5 % (ref 4.6–6.5)

## 2015-07-12 LAB — VITAMIN D 25 HYDROXY (VIT D DEFICIENCY, FRACTURES): VITD: 45.92 ng/mL (ref 30.00–100.00)

## 2015-07-12 NOTE — Assessment & Plan Note (Addendum)
Not taking PPI Rare dysphagia CBC Anti reflux measures

## 2015-07-12 NOTE — Progress Notes (Signed)
   Subjective:    Patient ID: Alicia Boyd, female    DOB: 06/08/1930, 81 y.o.   MRN: [id number]  HPI The patient is here to assess status of active health conditions.  PMH, FH, & Social History reviewed & updated.Her sister has developed atrial fib. Her mother also had atrial fibrillation.  She is on a heart healthy low-salt diet. They have not been exercising regular. Blood pressure averages 140/70.  Other than rare dysphagia and nocturia typically twice a night she has no active symptoms.    Review of Systems  Chest pain, palpitations, tachycardia, exertional dyspnea, paroxysmal nocturnal dyspnea, claudication or edema are absent. No unexplained weight loss, abdominal pain, significant dyspepsia, melena, rectal bleeding, or persistently small caliber stools. Dysuria, pyuria, hematuria, frequency,  or polyuria are denied. Change in hair, skin, nails denied. No bowel changes of constipation or diarrhea. No intolerance to heat or cold.      Objective:   Physical Exam  Pertinent or positive findings include:she has severe mixed arthritic change in the hands. There is slight crepitus of the knees. 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 Pedal pulses are decreased, especially posterior tibial pulses . General appearance :adequately nourished; in no distress.  Eyes: No conjunctival inflammation or scleral icterus is present.  Oral exam:  Lips and gums are healthy appearing.There is no oropharyngeal erythema or exudate noted. Dental hygiene is good.  Heart:  Normal rate and regular rhythm. S1 and S2 normal without gallop, murmur, click, rub or other extra sounds    Lungs:Chest clear to auscultation; no wheezes, rhonchi,rales ,or rubs present.No increased work of breathing.   Abdomen: bowel sounds normal, soft and non-tender without masses, organomegaly or hernias noted.  No guarding or rebound.  Vascular : all pulses equal ; no bruits present.  Skin:Warm & dry.   Intact without suspicious lesions or rashes ; no tenting or jaundice   Lymphatic: No lymphadenopathy is noted about the head, neck, axilla   Neuro: Strength, tone & DTRs normal.Subtle intermittent head tremor.        Assessment & Plan:  See Current Assessment & Plan in Problem List under specific Diagnosis

## 2015-07-12 NOTE — Progress Notes (Signed)
Pre visit review using our clinic review tool, if applicable. No additional management support is needed unless otherwise documented below in the visit note. 

## 2015-07-12 NOTE — Assessment & Plan Note (Signed)
Blood pressure goals reviewed. BMET 

## 2015-07-12 NOTE — Assessment & Plan Note (Signed)
Vit D level BMD

## 2015-07-12 NOTE — Patient Instructions (Addendum)
  Your next office appointment will be determined based upon review of your pending labs  and BMD  Those written interpretation of the lab results and instructions will be transmitted to you by mail for your records.  Critical results will be called.   Followup as needed for any active or acute issue. Please report any significant change in your symptoms.  Reflux of gastric acid may be asymptomatic as this may occur mainly during sleep.The triggers for reflux  include stress; the "aspirin family" ; alcohol; peppermint; and caffeine (coffee, tea, cola, and chocolate). The aspirin family would include aspirin and the nonsteroidal agents such as ibuprofen &  Naproxen. Tylenol would not cause reflux. If having symptoms ; food & drink should be avoided for @ least 2 hours before going to bed. If the dysphagia is recurrent; GI evaluation indicated.

## 2015-07-12 NOTE — Assessment & Plan Note (Signed)
A1c

## 2015-08-22 ENCOUNTER — Ambulatory Visit (INDEPENDENT_AMBULATORY_CARE_PROVIDER_SITE_OTHER): Payer: MEDICARE | Admitting: Emergency Medicine

## 2015-08-22 DIAGNOSIS — Z23 Encounter for immunization: Secondary | ICD-10-CM

## 2015-08-23 ENCOUNTER — Ambulatory Visit: Payer: Self-pay

## 2015-09-06 ENCOUNTER — Telehealth: Payer: Self-pay | Admitting: Internal Medicine

## 2015-09-06 NOTE — Telephone Encounter (Signed)
Pt and her husband Barnabas Lister have been long time patients of Dr. Linna Darner and they are requesting to establish with you Please advise

## 2015-09-08 NOTE — Telephone Encounter (Signed)
Unfortunately, I'm not able to accept any more new patients at this time.  I'm sorry! Thank you!  

## 2015-09-12 ENCOUNTER — Ambulatory Visit (INDEPENDENT_AMBULATORY_CARE_PROVIDER_SITE_OTHER): Payer: MEDICARE

## 2015-09-12 DIAGNOSIS — Z23 Encounter for immunization: Secondary | ICD-10-CM

## 2015-09-14 NOTE — Telephone Encounter (Signed)
Left patient vm with md response.  Also informed patient about [facility] and [facility] taking on patients.

## 2015-09-19 ENCOUNTER — Other Ambulatory Visit: Payer: Self-pay | Admitting: Internal Medicine

## 2015-12-07 ENCOUNTER — Ambulatory Visit (INDEPENDENT_AMBULATORY_CARE_PROVIDER_SITE_OTHER): Payer: MEDICARE | Admitting: Internal Medicine

## 2015-12-07 ENCOUNTER — Encounter: Payer: Self-pay | Admitting: Internal Medicine

## 2015-12-07 VITALS — BP 136/80 | HR 79 | Temp 98.2°F | Resp 16 | Wt 132.0 lb

## 2015-12-07 DIAGNOSIS — I10 Essential (primary) hypertension: Secondary | ICD-10-CM

## 2015-12-07 NOTE — Patient Instructions (Signed)
Please take the amlodipine in the morning. Minimal Blood Pressure Goal= AVERAGE < 140/90;  Ideal is an AVERAGE < 135/85. This AVERAGE should be calculated from @ least 5-7 BP readings taken @ different times of day on different days of week. You should not respond to isolated BP readings , but rather the AVERAGE for that week .Please bring your  blood pressure cuff to office visits to verify that it is reliable.It  can also be checked against the blood pressure device at the pharmacy. Finger or wrist cuffs are not dependable; an arm cuff is.  Reflux of gastric acid may be asymptomatic as this may occur mainly during sleep.The triggers for reflux  include stress; the "aspirin family" ; alcohol; peppermint; and caffeine (coffee, tea, cola, and chocolate). The aspirin family would include aspirin and the nonsteroidal agents such as ibuprofen &  Naproxen. Tylenol would not cause reflux. If having symptoms ; food & drink should be avoided for @ least 2 hours before going to bed.

## 2015-12-07 NOTE — Progress Notes (Signed)
Pre visit review using our clinic review tool, if applicable. No additional management support is needed unless otherwise documented below in the visit note. 

## 2015-12-07 NOTE — Progress Notes (Signed)
   Subjective:    Patient ID: Alicia Boyd, female    DOB: April 10, 1930, 81 y.o.   MRN: [id number]  HPI She's been taking her amlodipine at bedtime and feels that this causes a bad taste during the night.  She is on heart healthy diet. They've been unable to exercise due to the holiday schedule. Blood pressure ranges 140-150/80. She has no associated cardiopulmonary symptoms.  She also denies any significant reflux symptoms on PPI prn.  Review of Systems  Chest pain, palpitations, tachycardia, exertional dyspnea, paroxysmal nocturnal dyspnea, claudication or edema are absent.  Unexplained weight loss, abdominal pain, significant dyspepsia, dysphagia, melena, rectal bleeding, or persistently small caliber stools are denied.      Objective:   Physical Exam Pertinent or positive findings include: The right thyroid lobe is small. The pedal pulses are decreased. There are no ischemic changes over the feet. She has many venous spiders over the lower extremities. She has severe mixed DIP/PIP arthritic changes.  General appearance : Thin but adequately nourished; in no distress.  Eyes: No conjunctival inflammation or scleral icterus is present.  Heart:  Normal rate and regular rhythm. S1 and S2 normal without gallop, murmur, click, rub or other extra sounds    Lungs:Chest clear to auscultation; no wheezes, rhonchi,rales ,or rubs present.No increased work of breathing.   Abdomen: bowel sounds normal, soft and non-tender without masses, organomegaly or hernias noted.  No guarding or rebound.   Vascular : all pulses equal ; no bruits present.  Skin:Warm & dry.  Intact without suspicious lesions or rashes ; no tenting    Lymphatic: No lymphadenopathy is noted about the head, neck, axilla.   Neuro: Strength, tone & DTRs normal.      Assessment & Plan:  #1 hypertension; it appears the amlodipine is necessary for adequate control. Because of the bad taste at night I'll ask her take  amlodipine in the morning. Reflux prevention was also discussed.

## 2016-03-08 DIAGNOSIS — H2513 Age-related nuclear cataract, bilateral: Secondary | ICD-10-CM | POA: Diagnosis not present

## 2016-03-08 DIAGNOSIS — H35033 Hypertensive retinopathy, bilateral: Secondary | ICD-10-CM | POA: Diagnosis not present

## 2016-03-15 ENCOUNTER — Ambulatory Visit (INDEPENDENT_AMBULATORY_CARE_PROVIDER_SITE_OTHER): Payer: MEDICARE | Admitting: Internal Medicine

## 2016-03-15 ENCOUNTER — Encounter: Payer: Self-pay | Admitting: Internal Medicine

## 2016-03-15 VITALS — BP 180/90 | HR 93 | Temp 97.8°F | Resp 16 | Wt 128.0 lb

## 2016-03-15 DIAGNOSIS — I10 Essential (primary) hypertension: Secondary | ICD-10-CM

## 2016-03-15 NOTE — Patient Instructions (Addendum)
  Monitor your blood pressure at home - call if it is elevated at home, more than 150 on top.   Medications reviewed and updated.  No changes recommended at this time.    Please followup annually

## 2016-03-15 NOTE — Progress Notes (Signed)
Pre visit review using our clinic review tool, if applicable. No additional management support is needed unless otherwise documented below in the visit note. 

## 2016-03-15 NOTE — Assessment & Plan Note (Signed)
BP elevated here today - likely from increased stress She will monitor closely at home and call if it is not controlled Continue current medication - will titrate if needed

## 2016-03-15 NOTE — Progress Notes (Signed)
Subjective:    Patient ID: Alicia Boyd, female    DOB: 08-04-1930, 82 y.o.   MRN: [id number]  HPI She is here to establish with a new pcp. She is here for follow up of her blood pressure.   Hypertension: She is taking her medication daily. She is compliant with a low sodium diet.  She denies chest pain, palpitations, edema, shortness of breath and regular headaches. She is not exercising regularly.  She does monitor her blood pressure at home, 130-150/80's.     Medications and allergies reviewed with patient and updated if appropriate.  Patient Active Problem List   Diagnosis Date Noted  . Hyperglycemia 07/12/2015  . Paresthesia of hand 03/22/2014  . Undiagnosed cardiac murmurs 03/22/2014  . Palpitations 03/03/2012  . ANEMIA, MILD 07/27/2010  . VENOUS INSUFFICIENCY, MILD 07/27/2010  . OTHER CONSTIPATION 04/17/2010  . Essential hypertension 04/06/2010  . RHINOCONJUNCTIVITIS, ALLERGIC 04/04/2010  . PERSONAL HISTORY OTHER DISORDER URINARY SYSTEM 01/25/2010  . OTHER DYSPHAGIA 04/11/2009  . ESOPHAGEAL REFLUX 01/24/2009  . OTHER AND UNSPECIFIED HYPERLIPIDEMIA 11/23/2008  . Osteoporosis 11/23/2008  . NONSPECIFIC ABNORMAL ELECTROCARDIOGRAM 11/23/2008    Current Outpatient Prescriptions on File Prior to Visit  Medication Sig Dispense Refill  . amLODipine (NORVASC) 5 MG tablet TAKE 1 TABLET BY MOUTH EVERY DAY 90 tablet 2  . aspirin 81 MG tablet Take 81 mg by mouth daily.      . Calcium Carbonate (CALCIUM 600 PO) Take by mouth daily.    . Cholecalciferol (VITAMIN D3) 1000 UNITS CAPS Take by mouth daily.    . fexofenadine (ALLEGRA) 180 MG tablet Take 180 mg by mouth daily as needed.     . metoprolol succinate (TOPROL-XL) 25 MG 24 hr tablet Take 1 tablet (25 mg total) by mouth daily. 90 tablet 3  . Multiple Vitamin (MULTIVITAMIN) capsule Take 1 capsule by mouth daily.       No current facility-administered medications on file prior to visit.    Past Medical History  Diagnosis  Date  . Postmenopausal     No PMH or HRT  . Interstitial cystitis     Resolved  . Osteoporosis     T score - 3.0 @ femoral neck; Fosamax affected swallowing  . Aortic heart murmur     AS/AI on 2D ECHO ; SBE prophylaxis Rxed  . Hyperlipidemia   . Chest pain 2007 ; 2011    negative cardiac assessment    Past Surgical History  Procedure Laterality Date  . Colonoscopy  2011    Dr Sharlett Iles    Social History   Social History  . Marital Status: Married    Spouse Name: N/A  . Number of Children: N/A  . Years of Education: N/A   Social History Main Topics  . Smoking status: Never Smoker   . Smokeless tobacco: None  . Alcohol Use: No  . Drug Use: No  . Sexual Activity: Not Asked   Other Topics Concern  . None   Social History Narrative    Family History  Problem Relation Age of Onset  . Diabetes Father     Pancreatitic insufficiency post MVA  . Hypertension Mother   . Osteoporosis Mother   . Thyroid disease Sister     hypothyroidism  . Coronary artery disease Sister     AF  . Stroke Sister 28    also pulmonary disease  . Heart disease Mother     AF    Review of Systems  Constitutional: Negative for fever and chills.  Respiratory: Negative for cough, shortness of breath and wheezing.   Cardiovascular: Positive for palpitations (not racing, wave comes over her - once every 3 months). Negative for chest pain and leg swelling.  Neurological: Negative for dizziness, light-headedness and headaches.       Objective:   Filed Vitals:   03/15/16 1336  BP: 180/90  Pulse: 93  Temp: 97.8 F (36.6 C)  Resp: 16   Filed Weights   03/15/16 1336  Weight: 128 lb (58.06 kg)   Body mass index is 23.41 kg/(m^2).   Physical Exam Constitutional: Appears well-developed and well-nourished. No distress.  Neck: Neck supple. No tracheal deviation present. No thyromegaly present.  No carotid bruit. No cervical adenopathy.   Cardiovascular: Normal rate, regular rhythm and  normal heart sounds.   3/6 systolic murmur.  No edema Pulmonary/Chest: Effort normal and breath sounds normal. No respiratory distress. No wheezes.         Assessment & Plan:   See Problem List for Assessment and Plan of chronic medical problems.

## 2016-05-31 ENCOUNTER — Other Ambulatory Visit: Payer: Self-pay | Admitting: Internal Medicine

## 2016-06-07 ENCOUNTER — Ambulatory Visit (INDEPENDENT_AMBULATORY_CARE_PROVIDER_SITE_OTHER): Payer: MEDICARE | Admitting: Adult Health

## 2016-06-07 ENCOUNTER — Encounter: Payer: Self-pay | Admitting: Adult Health

## 2016-06-07 DIAGNOSIS — R350 Frequency of micturition: Secondary | ICD-10-CM | POA: Diagnosis not present

## 2016-06-07 DIAGNOSIS — R300 Dysuria: Secondary | ICD-10-CM

## 2016-06-07 LAB — POCT URINALYSIS DIPSTICK
BILIRUBIN UA: NEGATIVE
GLUCOSE UA: NEGATIVE
KETONES UA: NEGATIVE
LEUKOCYTES UA: NEGATIVE
NITRITE UA: NEGATIVE
Protein, UA: NEGATIVE
Spec Grav, UA: 1.01
Urobilinogen, UA: 0.2
pH, UA: 6

## 2016-06-07 MED ORDER — PHENAZOPYRIDINE HCL 100 MG PO TABS: 100.0000 mg | ORAL_TABLET | Freq: Three times a day (TID) | ORAL | Status: DC | PRN

## 2016-06-07 NOTE — Patient Instructions (Signed)
It was great meeting you today!  You do not have a urinary tract infection    I am going to send your urine for some more testing.   I have sent a prescription for pyridium to the pharmacy, please take this 3 times a day.   Continue to drink plenty of water

## 2016-06-07 NOTE — Progress Notes (Signed)
Subjective:    Patient ID: Alicia Boyd, female    DOB: 1930/09/18, 82 y.o.   MRN: [id number]  HPI 82 year old female who presents to the office today for an acute issue of " pressure and pain in my bladder." This has been any issue for the last 24 hours. She reports that she is urinating  more frequently as well as having dysuria.   She denies hematuria or any other UTI type symptoms.   She does have a history of cystitis and kidney stones  Review of Systems  Constitutional: Negative.   Respiratory: Negative.   Cardiovascular: Negative.   Genitourinary: Positive for dysuria, frequency and pelvic pain. Negative for hematuria, flank pain, vaginal bleeding, vaginal discharge, difficulty urinating and vaginal pain.  All other systems reviewed and are negative.  Past Medical History  Diagnosis Date  . Postmenopausal     No PMH or HRT  . Interstitial cystitis     Resolved  . Osteoporosis     T score - 3.0 @ femoral neck; Fosamax affected swallowing  . Aortic heart murmur     AS/AI on 2D ECHO ; SBE prophylaxis Rxed  . Hyperlipidemia   . Chest pain 2007 ; 2011    negative cardiac assessment    Social History   Social History  . Marital Status: Married    Spouse Name: N/A  . Number of Children: N/A  . Years of Education: N/A   Occupational History  . Not on file.   Social History Main Topics  . Smoking status: Never Smoker   . Smokeless tobacco: Not on file  . Alcohol Use: No  . Drug Use: No  . Sexual Activity: Not on file   Other Topics Concern  . Not on file   Social History Narrative    Past Surgical History  Procedure Laterality Date  . Colonoscopy  2011    Dr Sharlett Iles    Family History  Problem Relation Age of Onset  . Diabetes Father     Pancreatitic insufficiency post MVA  . Hypertension Mother   . Osteoporosis Mother   . Thyroid disease Sister     hypothyroidism  . Coronary artery disease Sister     AF  . Stroke Sister 45    also  pulmonary disease  . Heart disease Mother     AF    Allergies  Allergen Reactions  . Amitid [Amitriptyline Hcl]     Patient experienced a dizzy, spinning feeling   . Metronidazole     REACTION: throat swells  . Penicillins     REACTION: rash & fever  . Alendronate Sodium     REACTION: unspecified  . Clarithromycin     GI intolerance  . Flonase [Fluticasone Propionate]     Slight Headache   . Promethazine Hcl     REACTION: unspecified  . Robitussin (Alcohol Free) [Guaifenesin]     hyperactive  . Spironolactone     09/21/13 weakness in legs  . Singulair [Montelukast Sodium]     "spacey"; ineffective    Current Outpatient Prescriptions on File Prior to Visit  Medication Sig Dispense Refill  . amLODipine (NORVASC) 5 MG tablet TAKE 1 TABLET BY MOUTH EVERY DAY 90 tablet 2  . aspirin 81 MG tablet Take 81 mg by mouth daily.      . Calcium Carbonate (CALCIUM 600 PO) Take by mouth daily.    . Cholecalciferol (VITAMIN D3) 1000 UNITS CAPS Take by mouth daily.    Marland Kitchen  fexofenadine (ALLEGRA) 180 MG tablet Take 180 mg by mouth daily as needed.     . metoprolol succinate (TOPROL-XL) 25 MG 24 hr tablet TAKE 1 TABLET(25 MG) BY MOUTH DAILY 90 tablet 2  . Multiple Vitamin (MULTIVITAMIN) capsule Take 1 capsule by mouth daily.       No current facility-administered medications on file prior to visit.    BP 126/82 mmHg  Temp(Src) 98.1 F (36.7 C) (Oral)  Wt 125 lb 14.4 oz (57.108 kg)       Objective:   Physical Exam  Constitutional: She is oriented to person, place, and time. She appears well-developed. No distress.  Cardiovascular: Exam reveals no friction rub.   Abdominal: Soft. Bowel sounds are normal. She exhibits no distension and no mass. There is no rebound and no guarding.  Musculoskeletal: Normal range of motion. She exhibits no edema or tenderness.  Neurological: She is alert and oriented to person, place, and time.  Skin: Skin is warm and dry. No rash noted. She is not  diaphoretic. No erythema. No pallor.  Psychiatric: She has a normal mood and affect. Her behavior is normal. Judgment and thought content normal.  Nursing note and vitals reviewed.     Assessment & Plan:  1. Dysuria - POCT Urinalysis Dipstick- Negative for UTI, 1+ blood - Urine culture - phenazopyridine (PYRIDIUM) 100 MG tablet; Take 1 tablet (100 mg total) by mouth 3 (three) times daily as needed for pain.  Dispense: 10 tablet; Refill: 1 - Urinalysis with Reflex Microscopic - Will follow up regarding labs - Drink plenty of fluid - Follow up with PCP if needed  [name], NP

## 2016-06-10 ENCOUNTER — Other Ambulatory Visit: Payer: Self-pay | Admitting: Adult Health

## 2016-06-10 LAB — URINE CULTURE

## 2016-06-10 MED ORDER — SULFAMETHOXAZOLE-TRIMETHOPRIM 800-160 MG PO TABS: 1.0000 | ORAL_TABLET | Freq: Two times a day (BID) | ORAL | Status: DC

## 2016-06-11 ENCOUNTER — Telehealth: Payer: Self-pay | Admitting: Internal Medicine

## 2016-06-11 NOTE — Telephone Encounter (Signed)
Spoke with pt. Gave results per [facility] NP.

## 2016-06-11 NOTE — Telephone Encounter (Signed)
Relation to [id number] Call back Reeves:  Reason for call:  Patient inquiring about urine results. Please advise

## 2016-06-20 ENCOUNTER — Other Ambulatory Visit: Payer: Self-pay | Admitting: Internal Medicine

## 2016-07-19 ENCOUNTER — Ambulatory Visit: Payer: MEDICARE | Admitting: Internal Medicine

## 2016-07-29 ENCOUNTER — Encounter: Payer: Self-pay | Admitting: Internal Medicine

## 2016-07-29 ENCOUNTER — Ambulatory Visit (INDEPENDENT_AMBULATORY_CARE_PROVIDER_SITE_OTHER): Payer: MEDICARE | Admitting: Internal Medicine

## 2016-07-29 VITALS — BP 164/80 | HR 77 | Temp 97.6°F | Resp 16 | Wt 124.0 lb

## 2016-07-29 DIAGNOSIS — I10 Essential (primary) hypertension: Secondary | ICD-10-CM

## 2016-07-29 DIAGNOSIS — R0981 Nasal congestion: Secondary | ICD-10-CM

## 2016-07-29 DIAGNOSIS — I8390 Asymptomatic varicose veins of unspecified lower extremity: Secondary | ICD-10-CM

## 2016-07-29 DIAGNOSIS — R233 Spontaneous ecchymoses: Secondary | ICD-10-CM

## 2016-07-29 DIAGNOSIS — I868 Varicose veins of other specified sites: Secondary | ICD-10-CM | POA: Diagnosis not present

## 2016-07-29 DIAGNOSIS — R202 Paresthesia of skin: Secondary | ICD-10-CM

## 2016-07-29 NOTE — Assessment & Plan Note (Signed)
Related to hoarse voice Symptoms in morning only Likely related to allergies Start allegra daily

## 2016-07-29 NOTE — Progress Notes (Signed)
Subjective:    Patient ID: Alicia Boyd, female    DOB: Jun 26, 1930, 82 y.o.   MRN: [id number]  HPI She is here for an acute visit.   Numbness in right hand:  She has numbness in her first three fingers in the right hand.  It started when she was cleaning her silverware.  This occurred about one year.  It is constant.  She has pain on occasion - feels like nerve pain.  She denies wrist pain. She denies neck pain.    Veins in feet:  She has several veins in her feet that are prominent.  She denies swelling.  She has mild discomfort if she is non her feet a long time.  They are worse if on her feel long time.   Bruises on arms:  She takes an ASA 81 mg daily.  She has easy bruising - some of which are related to mild trauma.    Hypertension: She is taking her medication daily. She is compliant with a low sodium diet.  She denies chest pain, palpitations, edema, shortness of breath and regular headaches. She is exercising regularly.  She does monitor her blood pressure at home - 130-140/70-80.    Congestion, hoarse voice  in morning only:  She has allegra and does not take it.  She has to blow her nose a lot.  Clears up as day goes on.    Medications and allergies reviewed with patient and updated if appropriate.  Patient Active Problem List   Diagnosis Date Noted  . Hyperglycemia 07/12/2015  . Paresthesia of hand 03/22/2014  . Undiagnosed cardiac murmurs 03/22/2014  . Palpitations 03/03/2012  . ANEMIA, MILD 07/27/2010  . VENOUS INSUFFICIENCY, MILD 07/27/2010  . OTHER CONSTIPATION 04/17/2010  . Essential hypertension 04/06/2010  . RHINOCONJUNCTIVITIS, ALLERGIC 04/04/2010  . PERSONAL HISTORY OTHER DISORDER URINARY SYSTEM 01/25/2010  . OTHER DYSPHAGIA 04/11/2009  . ESOPHAGEAL REFLUX 01/24/2009  . OTHER AND UNSPECIFIED HYPERLIPIDEMIA 11/23/2008  . Osteoporosis 11/23/2008  . NONSPECIFIC ABNORMAL ELECTROCARDIOGRAM 11/23/2008    Current Outpatient Prescriptions on File Prior to  Visit  Medication Sig Dispense Refill  . amLODipine (NORVASC) 5 MG tablet TAKE 1 TABLET BY MOUTH EVERY DAY 90 tablet 2  . amLODipine (NORVASC) 5 MG tablet TAKE 1 TABLET BY MOUTH EVERY DAY 90 tablet 2  . aspirin 81 MG tablet Take 81 mg by mouth daily.      . Calcium Carbonate (CALCIUM 600 PO) Take by mouth daily.    . Cholecalciferol (VITAMIN D3) 1000 UNITS CAPS Take by mouth daily.    . fexofenadine (ALLEGRA) 180 MG tablet Take 180 mg by mouth daily as needed.     . metoprolol succinate (TOPROL-XL) 25 MG 24 hr tablet TAKE 1 TABLET(25 MG) BY MOUTH DAILY 90 tablet 2  . Multiple Vitamin (MULTIVITAMIN) capsule Take 1 capsule by mouth daily.      . phenazopyridine (PYRIDIUM) 100 MG tablet Take 1 tablet (100 mg total) by mouth 3 (three) times daily as needed for pain. 10 tablet 1  . sulfamethoxazole-trimethoprim (BACTRIM DS,SEPTRA DS) 800-160 MG tablet Take 1 tablet by mouth 2 (two) times daily. 10 tablet 0   No current facility-administered medications on file prior to visit.     Past Medical History:  Diagnosis Date  . Aortic heart murmur    AS/AI on 2D ECHO ; SBE prophylaxis Rxed  . Chest pain 2007 ; 2011   negative cardiac assessment  . Hyperlipidemia   . Interstitial cystitis  Resolved  . Osteoporosis    T score - 3.0 @ femoral neck; Fosamax affected swallowing  . Postmenopausal    No PMH or HRT    Past Surgical History:  Procedure Laterality Date  . COLONOSCOPY  2011   Dr Sharlett Iles    Social History   Social History  . Marital status: Married    Spouse name: N/A  . Number of children: N/A  . Years of education: N/A   Social History Main Topics  . Smoking status: Never Smoker  . Smokeless tobacco: Not on file  . Alcohol use No  . Drug use: No  . Sexual activity: Not on file   Other Topics Concern  . Not on file   Social History Narrative  . No narrative on file    Family History  Problem Relation Age of Onset  . Diabetes Father     Pancreatitic  insufficiency post MVA  . Hypertension Mother   . Osteoporosis Mother   . Thyroid disease Sister     hypothyroidism  . Coronary artery disease Sister     AF  . Stroke Sister 21    also pulmonary disease  . Heart disease Mother     AF    Review of Systems  Constitutional: Negative for fever.  HENT: Positive for congestion, postnasal drip and voice change. Negative for sinus pressure.   Respiratory: Negative for cough, shortness of breath and wheezing.   Cardiovascular: Positive for palpitations (occasional flutter/palpitations). Negative for chest pain and leg swelling.  Neurological: Positive for numbness. Negative for headaches.       Objective:   Vitals:   07/29/16 1047  BP: (!) 164/80  Pulse: 77  Resp: 16  Temp: 97.6 F (36.4 C)   Filed Weights   07/29/16 1047  Weight: 124 lb (56.2 kg)   Body mass index is 22.68 kg/m.   Physical Exam  Constitutional: She appears well-developed and well-nourished. No distress.  HENT:  Head: Normocephalic and atraumatic.  Right Ear: External ear normal.  Left Ear: External ear normal.  Mouth/Throat: Oropharynx is clear and moist.  B/l ear canals and TM normal  Eyes: Conjunctivae are normal.  Neck: Neck supple. No tracheal deviation present. No thyromegaly present.  Cardiovascular: Normal rate, regular rhythm and normal heart sounds.   No murmur heard. Small vessel varicosities in b/l feet/ankles  Pulmonary/Chest: Effort normal and breath sounds normal. No respiratory distress. She has no wheezes. She has no rales.  Musculoskeletal: She exhibits no edema.  Lymphadenopathy:    She has no cervical adenopathy.  Skin: Skin is warm and dry. She is not diaphoretic.  4-5 bruises on arms b/l typical of ASA therapy  Psychiatric: She has a normal mood and affect. Her behavior is normal.          Assessment & Plan:   See Problem List for Assessment and Plan of chronic medical problems.

## 2016-07-29 NOTE — Assessment & Plan Note (Signed)
BP controlled at home, but elevated here - which is typical Continue current medications at current doses Continue to monitor at home

## 2016-07-29 NOTE — Assessment & Plan Note (Addendum)
Possible carpal tunnel syndrome vs cervical in nature, which is less likely Discussed wrist brace at night  she deferred EMG/further evaluation at this time

## 2016-07-29 NOTE — Patient Instructions (Addendum)
Purchase a wrist brace at your pharmacy.  Wear the brace at night.  If this does not help and you want further treatment I can refer you to a hand specialist.    Start the allegra daily  Stop the aspirin.

## 2016-07-29 NOTE — Assessment & Plan Note (Signed)
Small veins in b/l feet/ankles Minimal discomfort, no swelling Discussed vascular referral, but she deferred

## 2016-07-29 NOTE — Progress Notes (Signed)
Pre visit review using our clinic review tool, if applicable. No additional management support is needed unless otherwise documented below in the visit note. 

## 2016-07-29 NOTE — Assessment & Plan Note (Signed)
Arms only Some from mild trauma, other spontaneous Will stop ASA 81 mg daily

## 2016-08-19 NOTE — Progress Notes (Addendum)
Subjective:   Alicia Boyd is a 82 y.o. female who presents for Medicare Annual (Subsequent) preventive examination.     HRA assessment completed during this visit with Alicia Boyd  The Patient was informed that the wellness visit is to identify future health risk and educate and initiate measures that can reduce risk for increased disease through the lifespan.    NO ROS; Medicare Wellness Visit  Psychosocial: (Mother; HTN; osteoporosis; HD; DM (Alicia [place]; CAD and stroke)  A[place]ia [place]; CAD  [place]; and went to [place] until his retirement   PMH: postmenopausal Osteoporosis/ no fractures/ takes calcium per day; Centrum silver and Vit D; declines further dexa scans; she is not going to take medicine  No recent falls  Hyperlipidemia/ lipid panel ratio 3;   BP up some; watches sodium BP generally 140; 130/ 70 at home;  Takes medication;  Checks bp frequently   Tobacco; never smoker EtOH no   Medications reviewed for issues; compliance; otc meds  BMI: 20.6     Diet Pimento cheese homemade and very little sodium This am had cinnamon toast; fruit with cottage cheese Fresh peaches;  Lunch; usually [place] sandwich; Fruit Vegetable Pineapple Supper; cooks most of the time blackeyed peas; carrots; vegetables; Tomatoes; cottage cheese / lots of vegetables and fruit    Exercise;   Both played golf;  25 to 30 years; stopped because the weather got to hot 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   Personal safety issues reviewed:  1.  for  risk such as safe community / yes 2.  smoke detector/ yes/ and carbon monoxide  3.  firearms safety if applicable  4. protection when in the sun; does not get in the sun 5. Skin checks/ [place] driving safety for seniors or any recent accidents./no   Risk for Depression reviewed: Any emotional problems? Anxious, depressed, irritable, sad or blue?  no Denies feeling depressed or hopeless; voices pleasure in daily life How many social activities have you been engaged in within the last 2 weeks? No   Cognitive; no Manages checkbook, medications; no failures of task Ad8 score reviewed for issues;  Issues making decisions; no  Less interest in hobbies / activities" no  Repeats questions, stories; family complaining: NO  Trouble using ordinary gadgets; microwave; computer: no  Forgets the month or year: no  Mismanaging finances: no  Missing apt: no but does write them down  Daily problems with thinking of memory NO Ad8 score is 0  MMSE not appropriate unless AD8 score is > 2   Typical day; get up; breakfast, visit neighbors;  Reads the paper Afternoon; busy; volunteering Bed by 11;  Advanced Directive addressed;  Yes;    Counseling Health Maintenance Gaps: Zostavax/ decided not to get one;  Had the shingles and was treated early   Dexa- many years ago; doesn't go anymore Does not want to repeat; took fosamax that caused some problems; will not treat any longer; stays busy cleaning  vacums  Her and  spouse do house cleaning  Keep in mind the flu shot is an inactivated vaccine and takes at least 2 weeks to build immunity. The flu virus can be dormant for 4 days prior to symptoms Taking the flu shot at the beginning of the season can reduce the risk for the entire community.    Colonoscopy;  Aged out  EKG: 05/2013 Mammogram: 2012  Hearing: no issues   Ophthalmology exam/ annual  Diabetic retinopathy if appropriate   Immunizations Due: (Vaccines reviewed and  educated regarding any overdue)    Established and updated Risk reviewed and appropriate referral made or health recommendations:  Current Care Team reviewed and updated   Cardiac Risk Factors include: advanced age (>65men, >5 women)     Objective:     Vitals: BP (!) 160/70   Ht 5\' 5"  (1.651 m)   Wt 124 lb (56.2 kg)   BMI 20.63 kg/m   Body mass index is 20.63 kg/m.  Checks BP at home and is not a problem; generally running 130/ 80 on average   Tobacco History  Smoking Status  . Never Smoker  Smokeless Tobacco  . Not on file     Counseling given: Yes   Past Medical History:  Diagnosis Date  . Aortic heart murmur    AS/AI on 2D ECHO ; SBE prophylaxis Rxed  . Chest pain 2007 ; 2011   negative cardiac assessment  . Hyperlipidemia   . Interstitial cystitis    Resolved  . Osteoporosis    T score - 3.0 @ femoral neck; Fosamax affected swallowing  . Postmenopausal    No PMH or HRT   Past Surgical History:  Procedure Laterality Date  . COLONOSCOPY  2011   [place]   Family History  Problem Relation Age of Onset  . Diabetes Father     Pancreatitic insufficiency post MVA  . Hypertension Mother   . Osteoporosis Mother   . Heart disease Mother     AF  . Boyd disease Alicia     hypothyroidism  . Coronary artery disease Alicia     AF  . Stroke Alicia 57    also pulmonary disease   History  Sexual Activity  . Sexual activity: Not on file    Outpatient Encounter Prescriptions as of 08/20/2016  Medication Sig  . amLODipine (NORVASC) 5 MG tablet TAKE 1 TABLET BY MOUTH EVERY DAY  . Calcium Carbonate (CALCIUM 600 PO) Take by mouth daily.  . Cholecalciferol (VITAMIN D3) 1000 UNITS CAPS Take by mouth daily.  . fexofenadine (ALLEGRA) 180 MG tablet Take 180 mg by mouth daily as needed.   . metoprolol succinate (TOPROL-XL) 25 MG 24 hr tablet TAKE 1 TABLET(25 MG) BY MOUTH DAILY  . Multiple Vitamin (MULTIVITAMIN) capsule Take 1 capsule by mouth daily.      No facility-administered encounter medications on file as of 08/20/2016.     Activities of Daily Living In your present state of health, do you have any difficulty performing the following activities: 08/20/2016  Hearing? N  Difficulty concentrating or making decisions? N  Walking or climbing stairs? N  Dressing or bathing? N  Doing errands, shopping? N  Preparing Food and eating ? N  Using the Toilet? N  In the past six months, have you accidently leaked urine? N  Do you have problems with loss of bowel control? N  Managing your Medications? N  Managing your Finances? N  Housekeeping or managing your Housekeeping? N  Some recent data  might be hidden    Patient Care Team: Binnie Rail, MD as PCP - General (Internal Medicine)    Assessment:      Exercise Activities and Dietary recommendations Current Exercise Habits: Home exercise routine, Type of exercise: Other - see comments (stays active )  Goals    . patient          Would like to walk more but is dependent on spouse       Fall Risk Fall Risk  08/20/2016 12/20/2014 06/24/2013  Falls in the past year? No No No   Depression Screen PHQ 2/9 Scores 08/20/2016 12/20/2014 06/24/2013  PHQ - 2 Score 0 0 0     Cognitive Testing MMSE - Mini Mental State Exam 08/20/2016  Not completed: (No Data)    Immunization History  Administered Date(s) Administered  . Influenza Split 12/10/2011, 09/29/2012  . Influenza, High Dose Seasonal PF 09/21/2014  . Influenza,inj,Quad PF,36+ Mos 10/08/2013, 09/12/2015  . Pneumococcal Conjugate-13 08/22/2015  . Pneumococcal Polysaccharide-23 11/23/2008  . Td 03/23/2004  . Tdap 07/12/2015   Screening Tests Health Maintenance  Topic Date Due  . ZOSTAVAX  10/31/1992  . INFLUENZA VACCINE  07/30/2016  . DEXA SCAN  08/18/2017 (Originally 10/31/1997)  . TETANUS/TDAP  07/11/2025  . PNA vac Low Risk Adult  Completed      Plan:   Will come back for high does flu shot  In Sept  Will  consider shingles but speak with Alicia. Quay Burow Educated to check with insurance regarding coverage of Shingles vaccination on Part D or Part B and may have lower co-pay if provided on the Part D side  During the course of the visit the patient was educated and counseled about the following appropriate screening and preventive services:   Vaccines to include Pneumoccal, Influenza, Hepatitis B, Td, Zostavax, HCV  Electrocardiogram  Cardiovascular Disease  Colorectal cancer screening  Bone density screening  Diabetes screening  Glaucoma screening  Mammography/PAP  Nutrition counseling   Patient Instructions (the written plan) was given to the patient.   Wynetta Fines, RN  08/20/2016    Medical screening examination/treatment/procedure(s) were performed by non-physician practitioner and as supervising physician I was immediately available for consultation/collaboration. I agree with above. Binnie Rail, MD

## 2016-08-20 ENCOUNTER — Ambulatory Visit (INDEPENDENT_AMBULATORY_CARE_PROVIDER_SITE_OTHER): Payer: MEDICARE

## 2016-08-20 VITALS — BP 160/70 | Ht 65.0 in | Wt 124.0 lb

## 2016-08-20 DIAGNOSIS — Z0000 Encounter for general adult medical examination without abnormal findings: Secondary | ICD-10-CM | POA: Diagnosis not present

## 2016-08-20 NOTE — Patient Instructions (Addendum)
Alicia Boyd , Thank you for taking time to come for your Medicare Wellness Visit. I appreciate your ongoing commitment to your health goals. Please review the following plan we discussed and let me know if I can assist you in the future.  Will come back for high does flu shot  In Sept  Will consider shingles but speak with Dr. Quay Burow Educated to check with insurance regarding coverage of Shingles vaccination on Part D or Part B and may have lower co-pay if provided on the Part D side    These are the goals we discussed: Goals    . patient          Would like to walk more but is dependent on spouse        This is a list of the screening recommended for you and due dates:  Health Maintenance  Topic Date Due  . Shingles Vaccine  10/31/1992  . Flu Shot  07/30/2016  . DEXA scan (bone density measurement)  08/18/2017*  . Tetanus Vaccine  07/11/2025  . Pneumonia vaccines  Completed  *Topic was postponed. The date shown is not the original due date.     Fall Prevention in the Home  Falls can cause injuries. They can happen to people of all ages. There are many things you can do to make your home safe and to help prevent falls.  WHAT CAN I DO ON THE OUTSIDE OF MY HOME?  Regularly fix the edges of walkways and driveways and fix any cracks.  Remove anything that might make you trip as you walk through a door, such as a raised step or threshold.  Trim any bushes or trees on the path to your home.  Use bright outdoor lighting.  Clear any walking paths of anything that might make someone trip, such as rocks or tools.  Regularly check to see if handrails are loose or broken. Make sure that both sides of any steps have handrails.  Any raised decks and porches should have guardrails on the edges.  Have any leaves, snow, or ice cleared regularly.  Use sand or salt on walking paths during winter.  Clean up any spills in your garage right away. This includes oil or grease spills. WHAT  CAN I DO IN THE BATHROOM?   Use night lights.  Install grab bars by the toilet and in the tub and shower. Do not use towel bars as grab bars.  Use non-skid mats or decals in the tub or shower.  If you need to sit down in the shower, use a plastic, non-slip stool.  Keep the floor dry. Clean up any water that spills on the floor as soon as it happens.  Remove soap buildup in the tub or shower regularly.  Attach bath mats securely with double-sided non-slip rug tape.  Do not have throw rugs and other things on the floor that can make you trip. WHAT CAN I DO IN THE BEDROOM?  Use night lights.  Make sure that you have a light by your bed that is easy to reach.  Do not use any sheets or blankets that are too big for your bed. They should not hang down onto the floor.  Have a firm chair that has side arms. You can use this for support while you get dressed.  Do not have throw rugs and other things on the floor that can make you trip. WHAT CAN I DO IN THE KITCHEN?  Clean up any spills right  away.  Avoid walking on wet floors.  Keep items that you use a lot in easy-to-reach places.  If you need to reach something above you, use a strong step stool that has a grab bar.  Keep electrical cords out of the way.  Do not use floor polish or wax that makes floors slippery. If you must use wax, use non-skid floor wax.  Do not have throw rugs and other things on the floor that can make you trip. WHAT CAN I DO WITH MY STAIRS?  Do not leave any items on the stairs.  Make sure that there are handrails on both sides of the stairs and use them. Fix handrails that are broken or loose. Make sure that handrails are as long as the stairways.  Check any carpeting to make sure that it is firmly attached to the stairs. Fix any carpet that is loose or worn.  Avoid having throw rugs at the top or bottom of the stairs. If you do have throw rugs, attach them to the floor with carpet tape.  Make  sure that you have a light switch at the top of the stairs and the bottom of the stairs. If you do not have them, ask someone to add them for you. WHAT ELSE CAN I DO TO HELP PREVENT FALLS?  Wear shoes that:  Do not have high heels.  Have rubber bottoms.  Are comfortable and fit you well.  Are closed at the toe. Do not wear sandals.  If you use a stepladder:  Make sure that it is fully opened. Do not climb a closed stepladder.  Make sure that both sides of the stepladder are locked into place.  Ask someone to hold it for you, if possible.  Clearly mark and make sure that you can see:  Any grab bars or handrails.  First and last steps.  Where the edge of each step is.  Use tools that help you move around (mobility aids) if they are needed. These include:  Canes.  Walkers.  Scooters.  Crutches.  Turn on the lights when you go into a dark area. Replace any light bulbs as soon as they burn out.  Set up your furniture so you have a clear path. Avoid moving your furniture around.  If any of your floors are uneven, fix them.  If there are any pets around you, be aware of where they are.  Review your medicines with your doctor. Some medicines can make you feel dizzy. This can increase your chance of falling. Ask your doctor what other things that you can do to help prevent falls.   This information is not intended to replace advice given to you by your health care provider. Make sure you discuss any questions you have with your health care provider.   Document Released: 10/12/2009 Document Revised: 05/02/2015 Document Reviewed: 01/20/2015 Elsevier Interactive Patient Education 2016 College Maintenance, Female Adopting a healthy lifestyle and getting preventive care can go a long way to promote health and wellness. Talk with your health care provider about what schedule of regular examinations is right for you. This is a good chance for you to check in  with your provider about disease prevention and staying healthy. In between checkups, there are plenty of things you can do on your own. Experts have done a lot of research about which lifestyle changes and preventive measures are most likely to keep you healthy. Ask your health care provider for more information.  WEIGHT AND DIET  Eat a healthy diet  Be sure to include plenty of vegetables, fruits, low-fat dairy products, and lean protein.  Do not eat a lot of foods high in solid fats, added sugars, or salt.  Get regular exercise. This is one of the most important things you can do for your health.  Most adults should exercise for at least 150 minutes each week. The exercise should increase your heart rate and make you sweat (moderate-intensity exercise).  Most adults should also do strengthening exercises at least twice a week. This is in addition to the moderate-intensity exercise.  Maintain a healthy weight  Body mass index (BMI) is a measurement that can be used to identify possible weight problems. It estimates body fat based on height and weight. Your health care provider can help determine your BMI and help you achieve or maintain a healthy weight.  For females 30 years of age and older:   A BMI below 18.5 is considered underweight.  A BMI of 18.5 to 24.9 is normal.  A BMI of 25 to 29.9 is considered overweight.  A BMI of 30 and above is considered obese.  Watch levels of cholesterol and blood lipids  You should start having your blood tested for lipids and cholesterol at 80 years of age, then have this test every 5 years.  You may need to have your cholesterol levels checked more often if:  Your lipid or cholesterol levels are high.  You are older than 80 years of age.  You are at high risk for heart disease.  CANCER SCREENING   Lung Cancer  Lung cancer screening is recommended for adults 14-86 years old who are at high risk for lung cancer because of a history  of smoking.  A yearly low-dose CT scan of the lungs is recommended for people who:  Currently smoke.  Have quit within the past 15 years.  Have at least a 30-pack-year history of smoking. A pack year is smoking an average of one pack of cigarettes a day for 1 year.  Yearly screening should continue until it has been 15 years since you quit.  Yearly screening should stop if you develop a health problem that would prevent you from having lung cancer treatment.  Breast Cancer  Practice breast self-awareness. This means understanding how your breasts normally appear and feel.  It also means doing regular breast self-exams. Let your health care provider know about any changes, no matter how small.  If you are in your 20s or 30s, you should have a clinical breast exam (CBE) by a health care provider every 1-3 years as part of a regular health exam.  If you are 67 or older, have a CBE every year. Also consider having a breast X-ray (mammogram) every year.  If you have a family history of breast cancer, talk to your health care provider about genetic screening.  If you are at high risk for breast cancer, talk to your health care provider about having an MRI and a mammogram every year.  Breast cancer gene (BRCA) assessment is recommended for women who have family members with BRCA-related cancers. BRCA-related cancers include:  Breast.  Ovarian.  Tubal.  Peritoneal cancers.  Results of the assessment will determine the need for genetic counseling and BRCA1 and BRCA2 testing. Cervical Cancer Your health care provider may recommend that you be screened regularly for cancer of the pelvic organs (ovaries, uterus, and vagina). This screening involves a pelvic examination, including checking  for microscopic changes to the surface of your cervix (Pap test). You may be encouraged to have this screening done every 3 years, beginning at age 68.  For women ages 73-65, health care providers may  recommend pelvic exams and Pap testing every 3 years, or they may recommend the Pap and pelvic exam, combined with testing for human papilloma virus (HPV), every 5 years. Some types of HPV increase your risk of cervical cancer. Testing for HPV may also be done on women of any age with unclear Pap test results.  Other health care providers may not recommend any screening for nonpregnant women who are considered low risk for pelvic cancer and who do not have symptoms. Ask your health care provider if a screening pelvic exam is right for you.  If you have had past treatment for cervical cancer or a condition that could lead to cancer, you need Pap tests and screening for cancer for at least 20 years after your treatment. If Pap tests have been discontinued, your risk factors (such as having a new sexual partner) need to be reassessed to determine if screening should resume. Some women have medical problems that increase the chance of getting cervical cancer. In these cases, your health care provider may recommend more frequent screening and Pap tests. Colorectal Cancer  This type of cancer can be detected and often prevented.  Routine colorectal cancer screening usually begins at 80 years of age and continues through 80 years of age.  Your health care provider may recommend screening at an earlier age if you have risk factors for colon cancer.  Your health care provider may also recommend using home test kits to check for hidden blood in the stool.  A small camera at the end of a tube can be used to examine your colon directly (sigmoidoscopy or colonoscopy). This is done to check for the earliest forms of colorectal cancer.  Routine screening usually begins at age 38.  Direct examination of the colon should be repeated every 5-10 years through 80 years of age. However, you may need to be screened more often if early forms of precancerous polyps or small growths are found. Skin Cancer  Check your  skin from head to toe regularly.  Tell your health care provider about any new moles or changes in moles, especially if there is a change in a mole's shape or color.  Also tell your health care provider if you have a mole that is larger than the size of a pencil eraser.  Always use sunscreen. Apply sunscreen liberally and repeatedly throughout the day.  Protect yourself by wearing long sleeves, pants, a wide-brimmed hat, and sunglasses whenever you are outside. HEART DISEASE, DIABETES, AND HIGH BLOOD PRESSURE   High blood pressure causes heart disease and increases the risk of stroke. High blood pressure is more likely to develop in:  People who have blood pressure in the high end of the normal range (130-139/85-89 mm Hg).  People who are overweight or obese.  People who are African American.  If you are 64-78 years of age, have your blood pressure checked every 3-5 years. If you are 68 years of age or older, have your blood pressure checked every year. You should have your blood pressure measured twice--once when you are at a hospital or clinic, and once when you are not at a hospital or clinic. Record the average of the two measurements. To check your blood pressure when you are not at a hospital or  clinic, you can use:  An automated blood pressure machine at a pharmacy.  A home blood pressure monitor.  If you are between 60 years and 108 years old, ask your health care provider if you should take aspirin to prevent strokes.  Have regular diabetes screenings. This involves taking a blood sample to check your fasting blood sugar level.  If you are at a normal weight and have a low risk for diabetes, have this test once every three years after 80 years of age.  If you are overweight and have a high risk for diabetes, consider being tested at a younger age or more often. PREVENTING INFECTION  Hepatitis B  If you have a higher risk for hepatitis B, you should be screened for this  virus. You are considered at high risk for hepatitis B if:  You were born in a country where hepatitis B is common. Ask your health care provider which countries are considered high risk.  Your parents were born in a high-risk country, and you have not been immunized against hepatitis B (hepatitis B vaccine).  You have HIV or AIDS.  You use needles to inject street drugs.  You live with someone who has hepatitis B.  You have had sex with someone who has hepatitis B.  You get hemodialysis treatment.  You take certain medicines for conditions, including cancer, organ transplantation, and autoimmune conditions. Hepatitis C  Blood testing is recommended for:  Everyone born from [birth year] through 1963.  Anyone with known risk factors for hepatitis C. Sexually transmitted infections (STIs)  You should be screened for sexually transmitted infections (STIs) including gonorrhea and chlamydia if:  You are sexually active and are younger than 80 years of age.  You are older than 80 years of age and your health care provider tells you that you are at risk for this type of infection.  Your sexual activity has changed since you were last screened and you are at an increased risk for chlamydia or gonorrhea. Ask your health care provider if you are at risk.  If you do not have HIV, but are at risk, it may be recommended that you take a prescription medicine daily to prevent HIV infection. This is called pre-exposure prophylaxis (PrEP). You are considered at risk if:  You are sexually active and do not regularly use condoms or know the HIV status of your partner(s).  You take drugs by injection.  You are sexually active with a partner who has HIV. Talk with your health care provider about whether you are at high risk of being infected with HIV. If you choose to begin PrEP, you should first be tested for HIV. You should then be tested every 3 months for as long as you are taking PrEP.  PREGNANCY     If you are premenopausal and you may become pregnant, ask your health care provider about preconception counseling.  If you may become pregnant, take 400 to 800 micrograms (mcg) of folic acid every day.  If you want to prevent pregnancy, talk to your health care provider about birth control (contraception). OSTEOPOROSIS AND MENOPAUSE   Osteoporosis is a disease in which the bones lose minerals and strength with aging. This can result in serious bone fractures. Your risk for osteoporosis can be identified using a bone density scan.  If you are 25 years of age or older, or if you are at risk for osteoporosis and fractures, ask your health care provider if you should be screened.  Ask your health care provider whether you should take a calcium or vitamin D supplement to lower your risk for osteoporosis.  Menopause may have certain physical symptoms and risks.  Hormone replacement therapy may reduce some of these symptoms and risks. Talk to your health care provider about whether hormone replacement therapy is right for you.  HOME CARE INSTRUCTIONS   Schedule regular health, dental, and eye exams.  Stay current with your immunizations.   Do not use any tobacco products including cigarettes, chewing tobacco, or electronic cigarettes.  If you are pregnant, do not drink alcohol.  If you are breastfeeding, limit how much and how often you drink alcohol.  Limit alcohol intake to no more than 1 drink per day for nonpregnant women. One drink equals 12 ounces of beer, 5 ounces of wine, or 1 ounces of hard liquor.  Do not use street drugs.  Do not share needles.  Ask your health care provider for help if you need support or information about quitting drugs.  Tell your health care provider if you often feel depressed.  Tell your health care provider if you have ever been abused or do not feel safe at home.   This information is not intended to replace advice given to you by your  health care provider. Make sure you discuss any questions you have with your health care provider.   Document Released: 07/01/2011 Document Revised: 01/06/2015 Document Reviewed: 11/17/2013 Elsevier Interactive Patient Education [employer].

## 2016-08-27 ENCOUNTER — Ambulatory Visit (INDEPENDENT_AMBULATORY_CARE_PROVIDER_SITE_OTHER): Payer: MEDICARE

## 2016-08-27 DIAGNOSIS — Z23 Encounter for immunization: Secondary | ICD-10-CM

## 2016-09-04 ENCOUNTER — Other Ambulatory Visit: Payer: Self-pay

## 2016-10-31 ENCOUNTER — Ambulatory Visit (INDEPENDENT_AMBULATORY_CARE_PROVIDER_SITE_OTHER): Payer: MEDICARE | Admitting: Nurse Practitioner

## 2016-10-31 ENCOUNTER — Encounter: Payer: Self-pay | Admitting: Nurse Practitioner

## 2016-10-31 ENCOUNTER — Other Ambulatory Visit: Payer: Self-pay | Admitting: Nurse Practitioner

## 2016-10-31 VITALS — BP 124/88 | HR 88 | Temp 97.9°F | Ht 64.0 in | Wt 122.0 lb

## 2016-10-31 DIAGNOSIS — J060 Acute laryngopharyngitis: Secondary | ICD-10-CM

## 2016-10-31 DIAGNOSIS — R0981 Nasal congestion: Secondary | ICD-10-CM

## 2016-10-31 MED ORDER — DOXYCYCLINE HYCLATE 100 MG PO TABS: 100.0000 mg | ORAL_TABLET | Freq: Two times a day (BID) | ORAL | 0 refills | Status: AC

## 2016-10-31 MED ORDER — IPRATROPIUM BROMIDE 0.03 % NA SOLN: 2.0000 | Freq: Two times a day (BID) | NASAL | 0 refills | Status: DC

## 2016-10-31 MED ORDER — METHYLPREDNISOLONE ACETATE 40 MG/ML IJ SUSP
40.0000 mg | Freq: Once | INTRAMUSCULAR | Status: AC
  Administered 2016-10-31: 40 mg via INTRAMUSCULAR

## 2016-10-31 NOTE — Progress Notes (Signed)
Subjective:  Patient ID: Alicia Boyd, female    DOB: 01-May-1930  Age: 82 y.o. MRN: [id number]  CC: Sore Throat (Pt stated sore throat for about 1 week)   Sore Throat   This is a new problem. The current episode started in the past 7 days. The problem has been gradually worsening. There has been no fever. The pain is moderate. Associated symptoms include congestion, a hoarse voice, a plugged ear sensation and swollen glands. Pertinent negatives include no abdominal pain, coughing, drooling, ear discharge, ear pain, headaches, neck pain, shortness of breath, stridor, trouble swallowing or vomiting. She has had no exposure to strep or mono. She has tried nothing for the symptoms.    Outpatient Medications Prior to Visit  Medication Sig Dispense Refill  . amLODipine (NORVASC) 5 MG tablet TAKE 1 TABLET BY MOUTH EVERY DAY 90 tablet 2  . Calcium Carbonate (CALCIUM 600 PO) Take by mouth daily.    . Cholecalciferol (VITAMIN D3) 1000 UNITS CAPS Take by mouth daily.    . fexofenadine (ALLEGRA) 180 MG tablet Take 180 mg by mouth daily as needed.     . metoprolol succinate (TOPROL-XL) 25 MG 24 hr tablet TAKE 1 TABLET(25 MG) BY MOUTH DAILY 90 tablet 2  . Multiple Vitamin (MULTIVITAMIN) capsule Take 1 capsule by mouth daily.       No facility-administered medications prior to visit.     ROS See HPI  Objective:  BP 124/88 (BP Location: Left Arm, Patient Position: Sitting, Cuff Size: Normal)   Pulse 88   Temp 97.9 F (36.6 C)   Ht 5\' 4"  (1.626 m)   Wt 122 lb (55.3 kg)   SpO2 98%   BMI 20.94 kg/m   BP Readings from Last 3 Encounters:  10/31/16 124/88  08/20/16 (!) 160/70  07/29/16 (!) 164/80    Wt Readings from Last 3 Encounters:  10/31/16 122 lb (55.3 kg)  08/20/16 124 lb (56.2 kg)  07/29/16 124 lb (56.2 kg)    Physical Exam  Constitutional: She is oriented to person, place, and time. No distress.  HENT:  Right Ear: Tympanic membrane, external ear and ear canal normal.  Left  Ear: Tympanic membrane, external ear and ear canal normal.  Nose: Mucosal edema and rhinorrhea present. Right sinus exhibits no maxillary sinus tenderness and no frontal sinus tenderness. Left sinus exhibits no maxillary sinus tenderness and no frontal sinus tenderness.  Mouth/Throat: Uvula is midline. Posterior oropharyngeal erythema present. No oropharyngeal exudate.  Eyes: No scleral icterus.  Neck: Normal range of motion. Neck supple.  Cardiovascular: Normal rate, regular rhythm and normal heart sounds.   Pulmonary/Chest: Effort normal and breath sounds normal. No respiratory distress.  Musculoskeletal: Normal range of motion.  Lymphadenopathy:    She has no cervical adenopathy.  Neurological: She is alert and oriented to person, place, and time.  Skin: Skin is warm and dry.  Psychiatric: She has a normal mood and affect. Her behavior is normal.    Lab Results  Component Value Date   WBC 6.3 07/12/2015   HGB 13.1 07/12/2015   HCT 39.6 07/12/2015   PLT 216.0 07/12/2015   GLUCOSE 96 07/12/2015   CHOL 227 (H) 06/24/2013   TRIG 80.0 06/24/2013   HDL 89.10 06/24/2013   LDLDIRECT 114.7 06/24/2013   ALT 18 06/24/2013   AST 15 06/24/2013   NA 140 07/12/2015   K 3.8 07/12/2015   CL 103 07/12/2015   CREATININE 0.51 07/12/2015   BUN 11 07/12/2015  CO2 32 07/12/2015   TSH 1.14 07/12/2015   INR 0.99 06/24/2010   HGBA1C 5.5 07/12/2015    Ct Maxillofacial Wo Cm  Result Date: 04/27/2013 *RADIOLOGY REPORT* Clinical Data: 82 year old female with left ear pain, maxillary pain, hoarseness.  Left eustachian tube dysfunction. CT MAXILLOFACIAL WITHOUT CONTRAST Technique:  Multidetector CT imaging of the maxillofacial structures was performed. Multiplanar CT image reconstructions were also generated. Comparison: None. Findings: Visualized noncontrast brain parenchyma is within normal limits for age. Calcified atherosclerosis at the skull base. Visualized deep soft tissue spaces of the face at  the skull base are within normal limits.  There is dental streak artifact. Visualized hypopharynx and larynx are within normal limits. Visualized orbits and scalp soft tissues are within normal limits. Minimal opacification of some inferior left mastoid air cells. Otherwise, the bilateral mastoids and tympanic cavities are clear. Both external auditory canals appear normal.  No periauricular soft tissue abnormality identified. The sphenoid sinuses are hyperplastic and clear.  The ethmoid air cells are clear. The frontal sinuses are hypoplastic and clear. Maxillary sinuses are clear.  Both OMCs are patent. No acute osseous abnormality identified.  Moderate C1 - odontoid degenerative changes. 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 IMPRESSION:  Negative tympanic cavities, mastoids, and paranasal sinuses. Original Report Authenticated By: Roselyn Reef, M.D.    Assessment & Plan:   Myah was seen today for sore throat.  Diagnoses and all orders for this visit:  Congestion of nasal sinus -     methylPREDNISolone acetate (DEPO-MEDROL) injection 40 mg; Inject 1 mL (40 mg total) into the muscle once. -     ipratropium (ATROVENT) 0.03 % nasal spray; Place 2 sprays into both nostrils every 12 (twelve) hours. Do not use more than 5days -     doxycycline (VIBRA-TABS) 100 MG tablet; Take 1 tablet (100 mg total) by mouth 2 (two) times daily.  Sore throat and laryngitis -     methylPREDNISolone acetate (DEPO-MEDROL) injection 40 mg; Inject 1 mL (40 mg total) into the muscle once. -     ipratropium (ATROVENT) 0.03 % nasal spray; Place 2 sprays into both nostrils every 12 (twelve) hours. Do not use more than 5days -     doxycycline (VIBRA-TABS) 100 MG tablet; Take 1 tablet (100 mg total) by mouth 2 (two) times daily.   I am having Ms. Mies start on ipratropium and doxycycline. I am also having her maintain her fexofenadine, multivitamin, Vitamin D3, Calcium Carbonate (CALCIUM 600 PO), metoprolol succinate,  and amLODipine. We administered methylPREDNISolone acetate.  Meds ordered this encounter  Medications  . methylPREDNISolone acetate (DEPO-MEDROL) injection 40 mg  . ipratropium (ATROVENT) 0.03 % nasal spray    Sig: Place 2 sprays into both nostrils every 12 (twelve) hours. Do not use more than 5days    Dispense:  15 mL    Refill:  0    Order Specific Question:   Supervising Provider    Answer:   Cassandria Anger [[id number]]  . doxycycline (VIBRA-TABS) 100 MG tablet    Sig: Take 1 tablet (100 mg total) by mouth 2 (two) times daily.    Dispense:  14 tablet    Refill:  0    Order Specific Question:   Supervising Provider    Answer:   Cassandria Anger [[id number]]    Follow-up: Return if symptoms worsen or fail to improve.  Wilfred Lacy, NP

## 2016-10-31 NOTE — Patient Instructions (Signed)
Start doxycycline if no improvement in 4days.  Encourage adequate oral hydration. May also chloraseptic lozenges as needed for sore throat.

## 2016-10-31 NOTE — Progress Notes (Signed)
Pre visit review using our clinic review tool, if applicable. No additional management support is needed unless otherwise documented below in the visit note. 

## 2016-11-14 NOTE — Telephone Encounter (Signed)
Routing to dr burns----since you have to use this cautiously, please advise if you want patient to have refill---thanks

## 2016-12-02 ENCOUNTER — Other Ambulatory Visit: Payer: Self-pay | Admitting: Internal Medicine

## 2017-01-20 ENCOUNTER — Other Ambulatory Visit: Payer: Self-pay | Admitting: *Deleted

## 2017-01-20 MED ORDER — METOPROLOL SUCCINATE ER 25 MG PO TB24: 25.0000 mg | ORAL_TABLET | Freq: Every day | ORAL | 0 refills | Status: DC

## 2017-01-20 NOTE — Telephone Encounter (Signed)
Rec'd call pt states she can only get medication from [facility]. Needing refill on her metoprolol. [facility]. Updated pharmacy info sent rx electronically to [facility]...Alicia Boyd

## 2017-03-14 ENCOUNTER — Telehealth: Payer: Self-pay | Admitting: Internal Medicine

## 2017-03-14 NOTE — Telephone Encounter (Signed)
That is fine with me.

## 2017-03-14 NOTE — Telephone Encounter (Signed)
Please schedule establish care visit

## 2017-03-14 NOTE — Telephone Encounter (Signed)
Pt would like to switch to cory nafziger from dr burns. Can I sch?

## 2017-03-14 NOTE — Telephone Encounter (Signed)
Ok with me 

## 2017-03-14 NOTE — Telephone Encounter (Signed)
Please advise 

## 2017-03-18 NOTE — Telephone Encounter (Signed)
Pt has been sch

## 2017-04-03 ENCOUNTER — Ambulatory Visit (INDEPENDENT_AMBULATORY_CARE_PROVIDER_SITE_OTHER): Payer: MEDICARE | Admitting: Family Medicine

## 2017-04-03 ENCOUNTER — Encounter: Payer: Self-pay | Admitting: Family Medicine

## 2017-04-03 VITALS — BP 120/80 | HR 72 | Temp 98.4°F | Wt 120.0 lb

## 2017-04-03 DIAGNOSIS — R58 Hemorrhage, not elsewhere classified: Secondary | ICD-10-CM

## 2017-04-03 NOTE — Progress Notes (Signed)
Pre visit review using our clinic review tool, if applicable. No additional management support is needed unless otherwise documented below in the visit note. 

## 2017-04-03 NOTE — Progress Notes (Signed)
Subjective:    Patient ID: Alicia Boyd, female    DOB: 07/07/1930, 83 y.o.   MRN: [id number]  HPI  Ms. Veith is an 83 year old female who presents today with bruising on both forearms after scraping arms on the casing of a door. She reports that this is not a new problem and notes this has occurred over the past 2 years when she is in a hurry and "bumps" into the door.  She reports that bruising improves and does not last.   She is taking an 81 mg ASA daily; no other history of anticoagulant use. She denies chest pain, palpitations, SOB, numbness, tingling, weakness, dizziness, melena, hematuria, hematochezia, or hemoptysis.  She denies any other areas of bruising and notes this only her her forearms bilaterally. She was evaluated 06/2016 for right hand paresthesia and PCP also noted symptoms of spontaneous ecchymosis with mild treatment and  advised patient to stop ASA; however patient has not stopped taking ASA at this time.  Review of Systems  Constitutional: Negative for chills, fatigue and fever.  Respiratory: Negative for cough, shortness of breath and wheezing.   Cardiovascular: Negative for chest pain and palpitations.  Gastrointestinal: Negative for abdominal pain, constipation, diarrhea, nausea and vomiting.  Musculoskeletal: Negative for myalgias.  Skin:       Bruising on forearms bilaterally  Neurological: Negative for dizziness, weakness, light-headedness and headaches.   Past Medical History:  Diagnosis Date  . Aortic heart murmur    AS/AI on 2D ECHO ; SBE prophylaxis Rxed  . Chest pain 2007 ; 2011   negative cardiac assessment  . Hyperlipidemia   . Interstitial cystitis    Resolved  . Osteoporosis    T score - 3.0 @ femoral neck; Fosamax affected swallowing  . Postmenopausal    No PMH or HRT     Social History   Social History  . Marital status: Married    Spouse name: N/A  . Number of children: N/A  . Years of education: N/A   Occupational History  .  Not on file.   Social History Main Topics  . Smoking status: Never Smoker  . Smokeless tobacco: Never Used  . Alcohol use No  . Drug use: No  . Sexual activity: Not on file   Other Topics Concern  . Not on file   Social History Narrative  . No narrative on file    Past Surgical History:  Procedure Laterality Date  . COLONOSCOPY  2011   Dr Sharlett Iles    Family History  Problem Relation Age of Onset  . Diabetes Father     Pancreatitic insufficiency post MVA  . Hypertension Mother   . Osteoporosis Mother   . Heart disease Mother     AF  . Thyroid disease Sister     hypothyroidism  . Coronary artery disease Sister     AF  . Stroke Sister 50    also pulmonary disease    Allergies  Allergen Reactions  . Amitid [Amitriptyline Hcl]     Patient experienced a dizzy, spinning feeling   . Metronidazole     REACTION: throat swells  . Penicillins     REACTION: rash & fever  . Alendronate Sodium     REACTION: unspecified  . Clarithromycin     GI intolerance  . Flonase [Fluticasone Propionate]     Slight Headache   . Promethazine Hcl     REACTION: unspecified  . Robitussin (Alcohol Free) [Guaifenesin]  hyperactive  . Spironolactone     09/21/13 weakness in legs  . Singulair [Montelukast Sodium]     "spacey"; ineffective    Current Outpatient Prescriptions on File Prior to Visit  Medication Sig Dispense Refill  . amLODipine (NORVASC) 5 MG tablet TAKE 1 TABLET BY MOUTH EVERY DAY 90 tablet 2  . Calcium Carbonate (CALCIUM 600 PO) Take by mouth daily.    . Cholecalciferol (VITAMIN D3) 1000 UNITS CAPS Take by mouth daily.    . fexofenadine (ALLEGRA) 180 MG tablet Take 180 mg by mouth daily as needed.     Marland Kitchen ipratropium (ATROVENT) 0.03 % nasal spray USE 2 SPRAYS IN EACH NOSTRIL EVERY 12 HOURS. DO NOT USE MORE THAN 5 DAYS 60 mL 11  . metoprolol succinate (TOPROL-XL) 25 MG 24 hr tablet Take 1 tablet (25 mg total) by mouth daily. Yearly physical w/labs due in March must see  MD for refills 90 tablet 0  . Multiple Vitamin (MULTIVITAMIN) capsule Take 1 capsule by mouth daily.       No current facility-administered medications on file prior to visit.     BP 120/80 (BP Location: Left Arm, Patient Position: Sitting, Cuff Size: Normal)   Pulse 72   Temp 98.4 F (36.9 C) (Oral)   Wt 120 lb (54.4 kg)   SpO2 98%   BMI 20.60 kg/m       Objective:   Physical Exam  Constitutional: She is oriented to person, place, and time.  Thin, elderly, optimally nourished female  Eyes: No scleral icterus.  Neck: Neck supple.  Cardiovascular: Normal rate and regular rhythm.   Pulmonary/Chest: Effort normal and breath sounds normal.  Abdominal: Soft. Bowel sounds are normal. There is no tenderness.  Lymphadenopathy:    She has no cervical adenopathy.  Neurological: She is alert and oriented to person, place, and time. Coordination normal.  Skin: Skin is warm and dry.  Two bruises noted on right and left forearm that appear typically with ASA therapy       Assessment & Plan:  1. Ecchymosis of forearm Bruising noted on distal upper extremity which appears to be related to bumping in to door or scraping door with her arm and typical of ASA therapy in older adults; previous symptom noted in patient's record; no other usual bleeding/bruising noted; advised her to hold daily ASA which was also previously recommended by PCP; will check CBC, and Hepatic Function. Advised her to monitor for other areas of bruising and follow up with PCP as recommended or sooner if she notices bruising in other locations particularly not explained or increased frequency of bruising.   - CBC with Differential/Platelet - Hepatic function panel  Delano Metz, FNP-C

## 2017-04-03 NOTE — Patient Instructions (Addendum)
It was a pleasure to see you today. We have ordered labs or studies at this visit. It can take up to 1-2 weeks for results and processing. IF results require follow up or explanation, we will call you with instructions. Clinically stable results will be released to your [facility]. If you have not heard from [place] or cannot find your results in [facility] in 2 weeks please contact our office at [phone].  If you are not yet signed up for [facility], please consider signing up   Also, please follow up with your provider as recommended or sooner if you notice increased frequency of bruising.    We NOW OFFER   Alicia Boyd's FAST TRACK!!!  SAME DAY Appointments for ACUTE CARE  Such as: Sprains, Injuries, cuts, abrasions, rashes, muscle pain, joint pain, back pain Colds, flu, sore throats, headache, allergies, cough, fever  Ear pain, sinus and eye infections Abdominal pain, nausea, vomiting, diarrhea, upset stomach Animal/insect bites  3 Easy Ways to Schedule: Walk-In Scheduling Call in scheduling Mychart Sign-up: [email or web address]

## 2017-04-04 LAB — CBC WITH DIFFERENTIAL/PLATELET
Basophils Absolute: 0 10*3/uL (ref 0.0–0.1)
Basophils Relative: 0.4 % (ref 0.0–3.0)
EOS ABS: 0.2 10*3/uL (ref 0.0–0.7)
Eosinophils Relative: 2.6 % (ref 0.0–5.0)
HEMATOCRIT: 36.3 % (ref 36.0–46.0)
HEMOGLOBIN: 12.4 g/dL (ref 12.0–15.0)
Lymphocytes Relative: 19.9 % (ref 12.0–46.0)
Lymphs Abs: 1.3 10*3/uL (ref 0.7–4.0)
MCHC: 34.1 g/dL (ref 30.0–36.0)
MCV: 93.9 fl (ref 78.0–100.0)
MONO ABS: 0.5 10*3/uL (ref 0.1–1.0)
Monocytes Relative: 8 % (ref 3.0–12.0)
Neutro Abs: 4.7 10*3/uL (ref 1.4–7.7)
Neutrophils Relative %: 69.1 % (ref 43.0–77.0)
Platelets: 203 10*3/uL (ref 150.0–400.0)
RBC: 3.86 Mil/uL — AB (ref 3.87–5.11)
RDW: 13.2 % (ref 11.5–15.5)
WBC: 6.8 10*3/uL (ref 4.0–10.5)

## 2017-04-04 LAB — HEPATIC FUNCTION PANEL
ALT: 16 U/L (ref 0–35)
AST: 13 U/L (ref 0–37)
Albumin: 3.8 g/dL (ref 3.5–5.2)
Alkaline Phosphatase: 68 U/L (ref 39–117)
BILIRUBIN TOTAL: 0.3 mg/dL (ref 0.2–1.2)
Bilirubin, Direct: 0.1 mg/dL (ref 0.0–0.3)
TOTAL PROTEIN: 5.6 g/dL — AB (ref 6.0–8.3)

## 2017-04-07 ENCOUNTER — Telehealth: Payer: Self-pay | Admitting: Internal Medicine

## 2017-04-07 NOTE — Telephone Encounter (Signed)
Spoke to patient this morning  and she is aware of her lab results.

## 2017-04-07 NOTE — Telephone Encounter (Signed)
Alicia Boyd pt returning your call for lab results will be leaving to go to take her husband to the doctor at 10:45

## 2017-04-10 ENCOUNTER — Ambulatory Visit (INDEPENDENT_AMBULATORY_CARE_PROVIDER_SITE_OTHER): Payer: MEDICARE | Admitting: Adult Health

## 2017-04-10 ENCOUNTER — Encounter: Payer: Self-pay | Admitting: Adult Health

## 2017-04-10 VITALS — Temp 98.2°F | Ht 64.0 in | Wt 120.0 lb

## 2017-04-10 DIAGNOSIS — R202 Paresthesia of skin: Secondary | ICD-10-CM | POA: Diagnosis not present

## 2017-04-10 DIAGNOSIS — I10 Essential (primary) hypertension: Secondary | ICD-10-CM

## 2017-04-10 DIAGNOSIS — Z7689 Persons encountering health services in other specified circumstances: Secondary | ICD-10-CM | POA: Diagnosis not present

## 2017-04-10 MED ORDER — AMLODIPINE BESYLATE 5 MG PO TABS: 5.0000 mg | ORAL_TABLET | Freq: Every day | ORAL | 3 refills | Status: DC

## 2017-04-10 NOTE — Progress Notes (Signed)
Patient presents to clinic today to establish care. She is a pleasant 83 year old female who  has a past medical history of Aortic heart murmur; Chest pain (2007 ; 2011); Hyperlipidemia; Interstitial cystitis; Osteoporosis; and Postmenopausal.   Acute Concerns: Establish Care   Chronic Issues: Hypertension - Takes Norvasc 5 mg and Metoprolol XL 25 mg.  She eats a low sodium diet. She denies CP or SOB. She does monitor her BP at home and reports her readings are 130-140 /70's.   Numbness in right hand:  She reports that she has numbness in her first three fingers in the right hand.  It started when she was cleaning her silverware over one year ago. The discomfort is consistent and happens intermittently. Does not have any wrist or arm pain.    Health Maintenance: Dental -- Routine Care  Vision -- Routine  Immunizations -- UTD  Diet: Eats a low sodium diet  Exercise: Se exercising on a regular basis   Past Medical History:  Diagnosis Date  . Aortic heart murmur    AS/AI on 2D ECHO ; SBE prophylaxis Rxed  . Chest pain 2007 ; 2011   negative cardiac assessment  . Hyperlipidemia   . Interstitial cystitis    Resolved  . Osteoporosis    T score - 3.0 @ femoral neck; Fosamax affected swallowing  . Postmenopausal    No PMH or HRT    Past Surgical History:  Procedure Laterality Date  . COLONOSCOPY  2011   Dr Sharlett Iles    Current Outpatient Prescriptions on File Prior to Visit  Medication Sig Dispense Refill  . amLODipine (NORVASC) 5 MG tablet TAKE 1 TABLET BY MOUTH EVERY DAY 90 tablet 2  . Calcium Carbonate (CALCIUM 600 PO) Take by mouth daily.    . Cholecalciferol (VITAMIN D3) 1000 UNITS CAPS Take by mouth daily.    . fexofenadine (ALLEGRA) 180 MG tablet Take 180 mg by mouth daily as needed.     Marland Kitchen ipratropium (ATROVENT) 0.03 % nasal spray USE 2 SPRAYS IN EACH NOSTRIL EVERY 12 HOURS. DO NOT USE MORE THAN 5 DAYS 60 mL 11  . metoprolol succinate (TOPROL-XL) 25 MG 24 hr  tablet Take 1 tablet (25 mg total) by mouth daily. Yearly physical w/labs due in March must see MD for refills 90 tablet 0  . Multiple Vitamin (MULTIVITAMIN) capsule Take 1 capsule by mouth daily.       No current facility-administered medications on file prior to visit.     Allergies  Allergen Reactions  . Amitid [Amitriptyline Hcl]     Patient experienced a dizzy, spinning feeling   . Metronidazole     REACTION: throat swells  . Penicillins     REACTION: rash & fever  . Alendronate Sodium     REACTION: unspecified  . Clarithromycin     GI intolerance  . Flonase [Fluticasone Propionate]     Slight Headache   . Promethazine Hcl     REACTION: unspecified  . Robitussin (Alcohol Free) [Guaifenesin]     hyperactive  . Spironolactone     09/21/13 weakness in legs  . Singulair [Montelukast Sodium]     "spacey"; ineffective    Family History  Problem Relation Age of Onset  . Diabetes Father     Pancreatitic insufficiency post MVA  . Hypertension Mother   . Osteoporosis Mother   . Heart disease Mother     AF  . Thyroid disease Sister     hypothyroidism  .  Coronary artery disease Sister     AF  . Stroke Sister 58    also pulmonary disease    Social History   Social History  . Marital status: Married    Spouse name: N/A  . Number of children: N/A  . Years of education: N/A   Occupational History  . Not on file.   Social History Main Topics  . Smoking status: Never Smoker  . Smokeless tobacco: Never Used  . Alcohol use No  . Drug use: No  . Sexual activity: Not on file   Other Topics Concern  . Not on file   Social History Narrative  . No narrative on file    Review of Systems  Constitutional: Negative.   HENT: Negative.   Eyes: Negative.   Respiratory: Negative.   Cardiovascular: Negative.   Gastrointestinal: Negative.   Genitourinary: Negative.   Musculoskeletal: Negative.   Skin: Negative.   Neurological: Positive for sensory change.    Psychiatric/Behavioral: Negative.   All other systems reviewed and are negative.   Temp 98.2 F (36.8 C) (Oral)   Ht 5\' 4"  (1.626 m)   Wt 120 lb (54.4 kg)   BMI 20.60 kg/m   Physical Exam  Constitutional: She is oriented to person, place, and time and well-developed, well-nourished, and in no distress. No distress.  HENT:  Head: Normocephalic and atraumatic.  Right Ear: External ear normal.  Left Ear: External ear normal.  Nose: Nose normal.  Mouth/Throat: Oropharynx is clear and moist. No oropharyngeal exudate.  Eyes: Conjunctivae and EOM are normal. Pupils are equal, round, and reactive to light. Right eye exhibits no discharge. Left eye exhibits no discharge.  Cardiovascular: Normal rate, regular rhythm, normal heart sounds and intact distal pulses.  Exam reveals no gallop and no friction rub.   No murmur heard. Pulmonary/Chest: Effort normal and breath sounds normal. No respiratory distress. She has no wheezes. She has no rales. She exhibits no tenderness.  Musculoskeletal: Normal range of motion. She exhibits no edema, tenderness or deformity.  Neurological: She is alert and oriented to person, place, and time. Gait normal. GCS score is 15.  Skin: Skin is warm and dry. No rash noted. She is not diaphoretic. No erythema. No pallor.  Small vessel varicosities in bilateral feet and ankles   Psychiatric: Mood, memory, affect and judgment normal.  Nursing note and vitals reviewed.   Recent Results (from the past 2160 hour(s))  CBC with Differential/Platelet     Status: Abnormal   Collection Time: 04/03/17  4:39 PM  Result Value Ref Range   WBC 6.8 4.0 - 10.5 K/uL   RBC 3.86 (L) 3.87 - 5.11 Mil/uL   Hemoglobin 12.4 12.0 - 15.0 g/dL   HCT 36.3 36.0 - 46.0 %   MCV 93.9 78.0 - 100.0 fl   MCHC 34.1 30.0 - 36.0 g/dL   RDW 13.2 11.5 - 15.5 %   Platelets 203.0 150.0 - 400.0 K/uL   Neutrophils Relative % 69.1 43.0 - 77.0 %   Lymphocytes Relative 19.9 12.0 - 46.0 %   Monocytes  Relative 8.0 3.0 - 12.0 %   Eosinophils Relative 2.6 0.0 - 5.0 %   Basophils Relative 0.4 0.0 - 3.0 %   Neutro Abs 4.7 1.4 - 7.7 K/uL   Lymphs Abs 1.3 0.7 - 4.0 K/uL   Monocytes Absolute 0.5 0.1 - 1.0 K/uL   Eosinophils Absolute 0.2 0.0 - 0.7 K/uL   Basophils Absolute 0.0 0.0 - 0.1 K/uL  Hepatic  function panel     Status: Abnormal   Collection Time: 04/03/17  4:39 PM  Result Value Ref Range   Total Bilirubin 0.3 0.2 - 1.2 mg/dL   Bilirubin, Direct 0.1 0.0 - 0.3 mg/dL   Alkaline Phosphatase 68 39 - 117 U/L   AST 13 0 - 37 U/L   ALT 16 0 - 35 U/L   Total Protein 5.6 (L) 6.0 - 8.3 g/dL   Albumin 3.8 3.5 - 5.2 g/dL    Assessment/Plan:  1. Encounter to establish care - Follow up in July for annual wellness exam  - Continue to stay active and follow a low sodum diet   2. Essential hypertension - Well controlled.  - No change in medication   3. Right hand paresthesia - Possible Carpal tunnel? She does not want further evaluation.  - Encouraged exercises such as squeezing a stress ball. She can also wear a wrist splint   [name], NP

## 2017-04-17 ENCOUNTER — Other Ambulatory Visit: Payer: Self-pay | Admitting: Internal Medicine

## 2017-04-18 NOTE — Telephone Encounter (Signed)
Ok to refill for one year  

## 2017-05-12 DIAGNOSIS — H2513 Age-related nuclear cataract, bilateral: Secondary | ICD-10-CM | POA: Diagnosis not present

## 2017-05-12 DIAGNOSIS — H35033 Hypertensive retinopathy, bilateral: Secondary | ICD-10-CM | POA: Diagnosis not present

## 2017-07-03 ENCOUNTER — Encounter: Payer: Self-pay | Admitting: Adult Health

## 2017-07-03 ENCOUNTER — Ambulatory Visit (INDEPENDENT_AMBULATORY_CARE_PROVIDER_SITE_OTHER): Payer: MEDICARE | Admitting: Adult Health

## 2017-07-03 VITALS — BP 138/62 | Ht 64.0 in | Wt 118.6 lb

## 2017-07-03 DIAGNOSIS — R2681 Unsteadiness on feet: Secondary | ICD-10-CM | POA: Diagnosis not present

## 2017-07-03 NOTE — Progress Notes (Signed)
Subjective:    Patient ID: Alicia Boyd, female    DOB: Sep 11, 1930, 83 y.o.   MRN: [id number]  HPI  83 year old female who  has a past medical history of Aortic heart murmur; Chest pain (2007 ; 2011); Hyperlipidemia; Interstitial cystitis; Osteoporosis; and Postmenopausal. She presents to the office today for the complaint of gait instability. She reports that over the last month or two she has noticed that she is more unstable on her feet. This happens when she is out running errands. Denies any dizziness or lightheadedness.   She has not had any recent falls.    Review of Systems See HPI   Past Medical History:  Diagnosis Date  . Aortic heart murmur    AS/AI on 2D ECHO ; SBE prophylaxis Rxed  . Chest pain 2007 ; 2011   negative cardiac assessment  . Hyperlipidemia   . Interstitial cystitis    Resolved  . Osteoporosis    T score - 3.0 @ femoral neck; Fosamax affected swallowing  . Postmenopausal    No PMH or HRT    Social History   Social History  . Marital status: Married    Spouse name: N/A  . Number of children: N/A  . Years of education: N/A   Occupational History  . Not on file.   Social History Main Topics  . Smoking status: Never Smoker  . Smokeless tobacco: Never Used  . Alcohol use No  . Drug use: No  . Sexual activity: Not on file   Other Topics Concern  . Not on file   Social History Narrative  . No narrative on file    Past Surgical History:  Procedure Laterality Date  . COLONOSCOPY  2011   Dr Sharlett Iles    Family History  Problem Relation Age of Onset  . Diabetes Father        Pancreatitic insufficiency post MVA  . Hypertension Mother   . Osteoporosis Mother   . Heart disease Mother        AF  . Thyroid disease Sister        hypothyroidism  . Coronary artery disease Sister        AF  . Stroke Sister 66       also pulmonary disease    Allergies  Allergen Reactions  . Amitid [Amitriptyline Hcl]     Patient experienced a  dizzy, spinning feeling   . Metronidazole     REACTION: throat swells  . Penicillins     REACTION: rash & fever  . Alendronate Sodium     REACTION: unspecified  . Clarithromycin     GI intolerance  . Flonase [Fluticasone Propionate]     Slight Headache   . Promethazine Hcl     REACTION: unspecified  . Robitussin (Alcohol Free) [Guaifenesin]     hyperactive  . Spironolactone     09/21/13 weakness in legs  . Singulair [Montelukast Sodium]     "spacey"; ineffective    Current Outpatient Prescriptions on File Prior to Visit  Medication Sig Dispense Refill  . amLODipine (NORVASC) 5 MG tablet Take 1 tablet (5 mg total) by mouth daily. 90 tablet 3  . Calcium Carbonate (CALCIUM 600 PO) Take by mouth daily.    . Cholecalciferol (VITAMIN D3) 1000 UNITS CAPS Take by mouth daily.    . fexofenadine (ALLEGRA) 180 MG tablet Take 180 mg by mouth daily as needed.     Marland Kitchen ipratropium (ATROVENT) 0.03 % nasal spray  USE 2 SPRAYS IN EACH NOSTRIL EVERY 12 HOURS. DO NOT USE MORE THAN 5 DAYS 60 mL 11  . metoprolol succinate (TOPROL-XL) 25 MG 24 hr tablet TAKE 1 TABLET BY MOUTH EVERY DAY 90 tablet 3  . Multiple Vitamin (MULTIVITAMIN) capsule Take 1 capsule by mouth daily.       No current facility-administered medications on file prior to visit.     BP 138/62 (BP Location: Left Arm, Patient Position: Sitting, Cuff Size: Normal)   Ht 5\' 4"  (1.626 m)   Wt 118 lb 9.6 oz (53.8 kg)   BMI 20.36 kg/m       Objective:   Physical Exam  Constitutional: She is oriented to person, place, and time. She appears well-developed and well-nourished. No distress.  Cardiovascular: Normal rate, regular rhythm, normal heart sounds and intact distal pulses.  Exam reveals no gallop.   No murmur heard. Pulmonary/Chest: Effort normal and breath sounds normal. No respiratory distress. She has no wheezes. She has no rales. She exhibits no tenderness.  Musculoskeletal: She exhibits no edema, tenderness or deformity.  Walks  with a slow steady gait   Neurological: She is alert and oriented to person, place, and time.  Skin: Skin is warm and dry. No rash noted. She is not diaphoretic. No erythema. No pallor.  Psychiatric: She has a normal mood and affect. Her behavior is normal. Judgment and thought content normal.  Nursing note and vitals reviewed.     Assessment & Plan:  1. Gait instability - Ambulatory referral to Physical Therapy - Follow up as needed   [name], NP

## 2017-07-03 NOTE — Patient Instructions (Signed)
It was great seeing you today   Someone from physical therapy will contract you

## 2017-07-16 ENCOUNTER — Ambulatory Visit: Payer: MEDICARE | Attending: Adult Health

## 2017-07-16 DIAGNOSIS — M6281 Muscle weakness (generalized): Secondary | ICD-10-CM | POA: Diagnosis not present

## 2017-07-16 DIAGNOSIS — R2689 Other abnormalities of gait and mobility: Secondary | ICD-10-CM

## 2017-07-16 NOTE — Patient Instructions (Addendum)
KNEE: Extension, Long Arc Quad (Weight)  Place weight around leg. Raise leg until knee is straight. Hold _5__ seconds. Use ___ lb weight. _10__ reps per set (each leg), 3_ sets per day, __7_ days per week  Copyright  VHI. All rights reserved.    Reverse Fly / Shoulder Retraction   Extend both arms in front of body at shoulder height, palms down, holding band. Move arms out to sides, squeeze shoulder blades together. Repeat _10__ times. Do _3__ sessions per day.   Copyright  VHI. All rights reserved.     Knee Raise   Lift knee and then lower it. Repeat with other knee. Repeat _10__ times each leg. Do _3___ sessions per day.  http://gt2.exer.us/445   Copyright  VHI. All rights reserved.  Toe Up   Gently rise up on toes and back on heels. Repeat _20___ times. Do 3___ sessions per day.  HIP: Hamstrings - Short Sitting    Rest leg on raised surface. Keep knee straight. Lift chest. Hold _20__ seconds. _3__ reps per set, __3_ sets per day, ___ days per week  Copyright  VHI. All rights reserved.    [facility] [address] [place],  [address] Phone # [phone] Fax [phone]

## 2017-07-16 NOTE — Therapy (Signed)
[facility] [address] [place], [place], [address] Phone: [phone]   Fax:  [phone]  Physical Therapy Evaluation  Patient Details  Name: Alicia Boyd MRN: [id number] Date of Birth: 1930/03/08 Referring Provider: [name], [facility]  Encounter Date: 07/16/2017      PT End of Session - 07/16/17 1602    Visit Number 1   Number of Visits 10   Date for PT Re-Evaluation 09/10/17   PT Start Time 6283   PT Stop Time 1603   PT Time Calculation (min) 33 min   Activity Tolerance Patient tolerated treatment well   Behavior During Therapy [facility] for tasks assessed/performed      Past Medical History:  Diagnosis Date  . Aortic heart murmur    AS/AI on 2D ECHO ; SBE prophylaxis Rxed  . Chest pain 2007 ; 2011   negative cardiac assessment  . Hyperlipidemia   . Interstitial cystitis    Resolved  . Osteoporosis    T score - 3.0 @ femoral neck; Fosamax affected swallowing  . Postmenopausal    No PMH or HRT    Past Surgical History:  Procedure Laterality Date  . COLONOSCOPY  2011   Dr Sharlett Iles    There were no vitals filed for this visit.       Subjective Assessment - 07/16/17 1533    Subjective Pt presents to PT with complaints of declining balance over the past 1.5 years.  Pt does not use a device for ambulation.      Pertinent History Osteoporosis (t score -3.0), fall 02/2016   Patient Stated Goals imrpove balance, prevent falls   Currently in Pain? No/denies            [facility] PT Assessment - 07/16/17 0001      Assessment   Medical Diagnosis gait instability   Referring Provider [name], [facility]   Onset Date/Surgical Date 03/16/16   Next MD Visit none   Prior Therapy none     Precautions   Precautions Fall;Other (comment)  osteoporosis   Precaution Comments osteoporosis t score -3.0     Balance Screen   Has the patient fallen in the past 6 months No   Has the patient had a decrease  in activity level because of a fear of falling?  No   Is the patient reluctant to leave their home because of a fear of falling?  No     Home Environment   Living Environment Private residence   Living Arrangements Spouse/significant other   Type of [place] to enter   Home Layout Two level     Prior Function   Level of [place] Retired   Leisure walking for exercise     Cognition   Overall Cognitive Status Within Functional Limits for tasks assessed     Posture/Postural Control   Posture/Postural Control Postural limitations   Postural Limitations Rounded Shoulders;Forward head;Increased thoracic kyphosis;Flexed trunk     ROM / Strength   AROM / PROM / Strength AROM;PROM;Strength     AROM   Overall AROM  Deficits   Overall AROM Comments hamstring flexibility limited by 50% bilaterally.       PROM   Overall PROM  Deficits   Overall PROM Comments general hip stiffness and limited P/ROM bilaterally     Strength   Overall Strength Deficits   Overall Strength Comments Bil knees 4+/5, hip flexion 4-/5, bil  UE strength 4/5.  Weak core with forward trunk and rounded shoulders     Transfers   Transfers Sit to Stand;Stand to Sit   Sit to Stand 6: Modified independent (Device/Increase time);With upper extremity assist   Stand to Sit 6: Modified independent (Device/Increase time);Without upper extremity assist     Ambulation/Gait   Ambulation/Gait Yes   Gait Pattern Step-through pattern;Decreased arm swing - left;Decreased arm swing - right;Decreased stride length;Trunk flexed;Decreased trunk rotation;Narrow base of support     Balance   Balance Assessed Yes     Standardized Balance Assessment   Standardized Balance Assessment Timed Up and Go Test;Five Times Sit to Stand   Five times sit to stand comments  10.63  uses legs against back of chair, instability upon standing.     Timed Up and Go Test   TUG Normal TUG   Normal  TUG (seconds) 11.86            Objective measurements completed on examination: See above findings.                  PT Education - 07/16/17 1558    Education provided Yes   Education Details seated strength, hamstring stretch   Person(s) Educated Patient   Methods Explanation;Demonstration;Handout   Comprehension Verbalized understanding;Returned demonstration          PT Short Term Goals - 07/16/17 1523      PT SHORT TERM GOAL #1   Title be independent in initial HEP   Time 4   Period Weeks   Status New     PT SHORT TERM GOAL #2   Title perform change of direction with gait and demonstrate stability with this   Time 4   Period Weeks   Status New     PT SHORT TERM GOAL #3   Title perform sit to stand without back of legs against chair without UE support   Time 4   Period Weeks   Status New     PT SHORT TERM GOAL #4   Title verbalize and demonstrate understanding of fall prevention and body mechanics modification needed due to osteoporosis   Time 4   Period Weeks   Status New           PT Long Term Goals - 07/16/17 1523      PT LONG TERM GOAL #1   Title be independent in advanced HEP   Time 8   Period Weeks   Status New     PT LONG TERM GOAL #2   Title demonstrate normalized base of support and upright trunk posture with gait on level surface   Time 8   Period Weeks   Status New     PT LONG TERM GOAL #3   Title perform 5x sit to stand in < or = to 10 seconds without use of backs of legs on chair to stabilize   Time 8   Period Weeks   Status New     PT LONG TERM GOAL #4   Title report 50% improved confifidence with gait on level surface   Time 8   Period Weeks   Status New                Plan - 07/16/17 1608    Clinical Impression Statement Pt reports to PT with reports of declining balance over the past 1.5 years.  Pt reports instability with change of direction and upon standing.  Pt demonstrates poor posture  with weak core, kyphosis, flexed trunk and posterior pelvic tilt due to shortened hamstring length.  Pt demonstrates shortend step length, instability with change of directions, narrow base of support.  Pt with use of backs of legs against chair with sit to stand.  Pt will benefit from skilled PT for LE strength, gait and balance training to improve safety at home and in the community   History and Personal Factors relevant to plan of care: osteoporosis   Clinical Presentation Evolving   Clinical Presentation due to: balance has been declining over the past year with many near falls recently   Clinical Decision Making Moderate   Rehab Potential Good   PT Frequency 2x / week   PT Duration 8 weeks   PT Treatment/Interventions ADLs/Self Care Home Management;Cryotherapy;Functional mobility training;Stair training;Gait training;Moist Heat;Therapeutic activities;Therapeutic exercise;Balance training;Neuromuscular re-education;Patient/family education;Passive range of motion;Manual techniques;Taping   PT Next Visit Plan Osteoporosis and body mechanics education, LE strength, sit to stand, change of directions. gait with wider base of support   Consulted and Agree with Plan of Care Patient      Patient will benefit from skilled therapeutic intervention in order to improve the following deficits and impairments:  Abnormal gait, Decreased activity tolerance, Decreased balance, Decreased strength, Postural dysfunction, Impaired flexibility, Difficulty walking, Improper body mechanics  Visit Diagnosis: Muscle weakness (generalized) - Plan: PT plan of care cert/re-cert  Other abnormalities of gait and mobility - Plan: PT plan of care cert/re-cert      G-Codes - 67/54/49 1616    Functional Assessment Tool Used (Outpatient Only) TUG, clinical   Functional Limitation Mobility: Walking and moving around   Mobility: Walking and Moving Around Current Status (E0100) At least 40 percent but less than 60 percent  impaired, limited or restricted   Mobility: Walking and Moving Around Goal Status [phone]) At least 40 percent but less than 60 percent impaired, limited or restricted       Problem List Patient Active Problem List   Diagnosis Date Noted  . Ecchymoses, spontaneous 07/29/2016  . Varicose veins 07/29/2016  . Nasal congestion 07/29/2016  . Hyperglycemia 07/12/2015  . Paresthesia of hand 03/22/2014  . Undiagnosed cardiac murmurs 03/22/2014  . Palpitations 03/03/2012  . ANEMIA, MILD 07/27/2010  . VENOUS INSUFFICIENCY, MILD 07/27/2010  . OTHER CONSTIPATION 04/17/2010  . Essential hypertension 04/06/2010  . RHINOCONJUNCTIVITIS, ALLERGIC 04/04/2010  . PERSONAL HISTORY OTHER DISORDER URINARY SYSTEM 01/25/2010  . OTHER DYSPHAGIA 04/11/2009  . ESOPHAGEAL REFLUX 01/24/2009  . OTHER AND UNSPECIFIED HYPERLIPIDEMIA 11/23/2008  . Osteoporosis 11/23/2008  . NONSPECIFIC ABNORMAL ELECTROCARDIOGRAM 11/23/2008     Sigurd Sos, PT 07/16/17 4:16 PM  [facility] [address], [place], [place], [address] Phone: [phone]   Fax:  [phone]  Name: Alicia Boyd MRN: [id number] Date of Birth: 08-18-1930

## 2017-07-21 DIAGNOSIS — D692 Other nonthrombocytopenic purpura: Secondary | ICD-10-CM | POA: Diagnosis not present

## 2017-07-21 DIAGNOSIS — L570 Actinic keratosis: Secondary | ICD-10-CM | POA: Diagnosis not present

## 2017-07-21 DIAGNOSIS — Z85828 Personal history of other malignant neoplasm of skin: Secondary | ICD-10-CM | POA: Diagnosis not present

## 2017-07-21 DIAGNOSIS — L821 Other seborrheic keratosis: Secondary | ICD-10-CM | POA: Diagnosis not present

## 2017-07-23 ENCOUNTER — Ambulatory Visit: Payer: MEDICARE | Admitting: Physical Therapy

## 2017-08-04 ENCOUNTER — Encounter: Payer: MEDICARE | Admitting: Rehabilitative and Restorative Service Providers"

## 2017-08-06 ENCOUNTER — Ambulatory Visit: Payer: MEDICARE | Attending: Adult Health | Admitting: Physical Therapy

## 2017-08-06 ENCOUNTER — Encounter: Payer: Self-pay | Admitting: Physical Therapy

## 2017-08-06 DIAGNOSIS — R2689 Other abnormalities of gait and mobility: Secondary | ICD-10-CM

## 2017-08-06 DIAGNOSIS — M6281 Muscle weakness (generalized): Secondary | ICD-10-CM | POA: Diagnosis not present

## 2017-08-06 NOTE — Therapy (Signed)
[facility] [address], [place], [place], [address] Phone: [phone]   Fax:  [phone]  Physical Therapy Treatment  Patient Details  Name: Alicia Boyd MRN: [id number] Date of Birth: [birth year] Referring Provider: [name], [facility]  Encounter Date: 08/06/2017      PT End of Session - 08/06/17 1840    Visit Number 2   Number of Visits 10   Date for PT Re-Evaluation 09/10/17   PT Start Time 9485   PT Stop Time 1608   PT Time Calculation (min) 38 min   Activity Tolerance Patient tolerated treatment well   Behavior During Therapy [facility] for tasks assessed/performed      Past Medical History:  Diagnosis Date  . Aortic heart murmur    AS/AI on 2D ECHO ; SBE prophylaxis Rxed  . Chest pain 2007 ; 2011   negative cardiac assessment  . Hyperlipidemia   . Interstitial cystitis    Resolved  . Osteoporosis    T score - 3.0 @ femoral neck; Fosamax affected swallowing  . Postmenopausal    No PMH or HRT    Past Surgical History:  Procedure Laterality Date  . COLONOSCOPY  2011   Dr Sharlett Iles    There were no vitals filed for this visit.      Subjective Assessment - 08/06/17 1841    Subjective Computer network was down. Pt signed a paper AOB and will be scanned in. Pt arrived at 1508. She reports feeling tired, and had questions regarding the burning in her feet.  She also reports the band exercises hurt her fingers and she does not want to do that exercise.    Currently in Pain? No/denies   Multiple Pain Sites No                         [facility] Adult PT Treatment/Exercise - 08/06/17 0001      High Level Balance   High Level Balance Comments Heel raises 10x, side stepping at counter 6x no UE, weight shifting x 1 min      Self-Care   Self-Care Other Self-Care Comments   Other Self-Care Comments  How to use a tennis ball on her feet and the ball given to her at [facility] on her hands to  decrease burning sensations. Verbal and demo education how to do with verbal understanding by pt.      Knee/Hip Exercises: Aerobic   Nustep L 1 x 5 min  PTA monitored pt as this was performed at the end of tx      Knee/Hip Exercises: Standing   Other Standing Knee Exercises Walking 2  laps of clinic with VC to stand taller and engage her core     Knee/Hip Exercises: Seated   Long Arc Quad AROM;Strengthening;Both;2 sets;10 reps   Marching AROM;Strengthening;Both;2 sets;10 reps   Sit to [employer] 2 sets;10 reps;without UE support  VC to widen BOS     Ankle Exercises: Seated   Heel Raises 20 reps   Toe Raise 20 reps                  PT Short Term Goals - 07/16/17 1523      PT SHORT TERM GOAL #1   Title be independent in initial HEP   Time 4   Period Weeks   Status New     PT SHORT TERM GOAL #2   Title perform change of direction with  gait and demonstrate stability with this   Time 4   Period Weeks   Status New     PT SHORT TERM GOAL #3   Title perform sit to stand without back of legs against chair without UE support   Time 4   Period Weeks   Status New     PT SHORT TERM GOAL #4   Title verbalize and demonstrate understanding of fall prevention and body mechanics modification needed due to osteoporosis   Time 4   Period Weeks   Status New           PT Long Term Goals - 07/16/17 1523      PT LONG TERM GOAL #1   Title be independent in advanced HEP   Time 8   Period Weeks   Status New     PT LONG TERM GOAL #2   Title demonstrate normalized base of support and upright trunk posture with gait on level surface   Time 8   Period Weeks   Status New     PT LONG TERM GOAL #3   Title perform 5x sit to stand in < or = to 10 seconds without use of backs of legs on chair to stabilize   Time 8   Period Weeks   Status New     PT LONG TERM GOAL #4   Title report 50% improved confifidence with gait on level surface   Time 8   Period Weeks   Status New                Plan - 08/06/17 1848    Clinical Impression Statement Pt was educated in walking with taller posture, to "lift her heart" and how to engage her core. She reports this makes her feel steadier. Pt was very concerned with the burning in her feet and fingers. We modified some HEP to accomodate this. Pt performed a much improved sit to stand wiithout UE use. She is also compliant in her initial HEP meeting this short term goal. Pt was pretty fatigued at 38 minutes of activity.    Rehab Potential Good   PT Duration 8 weeks   PT Treatment/Interventions ADLs/Self Care Home Management;Cryotherapy;Functional mobility training;Stair training;Gait training;Moist Heat;Therapeutic activities;Therapeutic exercise;Balance training;Neuromuscular re-education;Patient/family education;Passive range of motion;Manual techniques;Taping   PT Next Visit Plan Osteoporosis and body mechanics education, LE strength, sit to stand, change of directions. gait with wider base of support   Consulted and Agree with Plan of Care Patient      Patient will benefit from skilled therapeutic intervention in order to improve the following deficits and impairments:  Abnormal gait, Decreased activity tolerance, Decreased balance, Decreased strength, Postural dysfunction, Impaired flexibility, Difficulty walking, Improper body mechanics  Visit Diagnosis: Muscle weakness (generalized)  Other abnormalities of gait and mobility     Problem List Patient Active Problem List   Diagnosis Date Noted  . Ecchymoses, spontaneous 07/29/2016  . Varicose veins 07/29/2016  . Nasal congestion 07/29/2016  . Hyperglycemia 07/12/2015  . Paresthesia of hand 03/22/2014  . Undiagnosed cardiac murmurs 03/22/2014  . Palpitations 03/03/2012  . ANEMIA, MILD 07/27/2010  . VENOUS INSUFFICIENCY, MILD 07/27/2010  . OTHER CONSTIPATION 04/17/2010  . Essential hypertension 04/06/2010  . RHINOCONJUNCTIVITIS, ALLERGIC 04/04/2010  .  PERSONAL HISTORY OTHER DISORDER URINARY SYSTEM 01/25/2010  . OTHER DYSPHAGIA 04/11/2009  . ESOPHAGEAL REFLUX 01/24/2009  . OTHER AND UNSPECIFIED HYPERLIPIDEMIA 11/23/2008  . Osteoporosis 11/23/2008  . NONSPECIFIC ABNORMAL ELECTROCARDIOGRAM 11/23/2008    Nathali Vent, PTA  08/06/2017, 6:53 PM  [facility] [address] [place], [place], [address] Phone: [phone]   Fax:  [phone]  Name: Alicia Boyd MRN: [id number] Date of Birth: 1930-05-27

## 2017-08-11 ENCOUNTER — Ambulatory Visit: Payer: MEDICARE

## 2017-08-11 DIAGNOSIS — M6281 Muscle weakness (generalized): Secondary | ICD-10-CM

## 2017-08-11 DIAGNOSIS — R2689 Other abnormalities of gait and mobility: Secondary | ICD-10-CM

## 2017-08-11 NOTE — Therapy (Signed)
[facility] [address], [place], [place], [address] Phone: [phone]   Fax:  [phone]  Physical Therapy Treatment  Patient Details  Name: Alicia Boyd MRN: [id number] Date of Birth: [birth year] Referring Provider: [name], [facility]  Encounter Date: 08/11/2017      PT End of Session - 08/11/17 1606    Visit Number 3   Number of Visits 10   Date for PT Re-Evaluation 09/10/17   PT Start Time 1525   PT Stop Time 1609   PT Time Calculation (min) 44 min   Activity Tolerance Patient tolerated treatment well   Behavior During Therapy [facility] for tasks assessed/performed      Past Medical History:  Diagnosis Date  . Aortic heart murmur    AS/AI on 2D ECHO ; SBE prophylaxis Rxed  . Chest pain 2007 ; 2011   negative cardiac assessment  . Hyperlipidemia   . Interstitial cystitis    Resolved  . Osteoporosis    T score - 3.0 @ femoral neck; Fosamax affected swallowing  . Postmenopausal    No PMH or HRT    Past Surgical History:  Procedure Laterality Date  . COLONOSCOPY  2011   Dr Sharlett Iles    There were no vitals filed for this visit.      Subjective Assessment - 08/11/17 1522    Subjective Pt has been doing exercises at home.  I still feel wobbly.     Pertinent History Osteoporosis (t score -3.0), fall 02/2016   Patient Stated Goals imrpove balance, prevent falls                         [facility] Adult PT Treatment/Exercise - 08/11/17 0001      High Level Balance   High Level Balance Comments Heel raises 10x, side stepping at counter 6x no UE, weight shifting x 1 min      Knee/Hip Exercises: Aerobic   Nustep L2 x 6 min  PT present to discuss progress with pt.       Knee/Hip Exercises: Standing   Heel Raises Both;2 sets;10 reps   Hip Abduction Stengthening;Both;2 sets;10 reps   Abduction Limitations 1# added   Hip Extension Stengthening;Both;2 sets;10 reps   Rebounder weight  shifting 3 ways x 1 minute each     Knee/Hip Exercises: Seated   Long Arc Quad AROM;Strengthening;Both;2 sets;10 reps   Long Arc Quad Weight 1 lbs.   Marching AROM;Strengthening;Both;2 sets;10 reps   Marching Limitations 1   Sit to [place] 2 sets;10 reps;without UE support  VC to widen BOS     Ankle Exercises: Seated   Heel Raises 20 reps   Toe Raise 20 reps                PT Education - 08/11/17 1546    Education provided Yes   Education Details fall prevention, osteoporosis education   Person(s) Educated Patient   Methods Explanation;Handout;Demonstration   Comprehension Verbalized understanding          PT Short Term Goals - 08/11/17 1534      PT SHORT TERM GOAL #1   Title be independent in initial HEP   Time 4   Period Weeks   Status On-going     PT SHORT TERM GOAL #2   Title perform change of direction with gait and demonstrate stability with this   Time 4   Period Weeks   Status On-going  PT SHORT TERM GOAL #3   Title perform sit to stand without back of legs against chair without UE support   Time 4   Period Weeks   Status On-going     PT SHORT TERM GOAL #4   Title verbalize and demonstrate understanding of fall prevention and body mechanics modification needed due to osteoporosis   Time --   Period --   Status Achieved           PT Long Term Goals - 07/16/17 1523      PT LONG TERM GOAL #1   Title be independent in advanced HEP   Time 8   Period Weeks   Status New     PT LONG TERM GOAL #2   Title demonstrate normalized base of support and upright trunk posture with gait on level surface   Time 8   Period Weeks   Status New     PT LONG TERM GOAL #3   Title perform 5x sit to stand in < or = to 10 seconds without use of backs of legs on chair to stabilize   Time 8   Period Weeks   Status New     PT LONG TERM GOAL #4   Title report 50% improved confifidence with gait on level surface   Time 8   Period Weeks   Status New                Plan - 08/11/17 1539    Clinical Impression Statement Pt is working on improving her posture with walking upright and engaging her core when walking.  Pt with UE and LE neuropathy symtpoms that limit ability to stand.  Pt received education regarding fall prevention and body mechanics to reduce fractures related to osteoporosis.  Pt was able to tolerate addition of ankle weights with exercise today.  Pt will continue to benefit from skilled PT for balance, strength and endruance to imrpove safety at home and in the community.     Rehab Potential Good   PT Frequency 2x / week   PT Duration 8 weeks   PT Treatment/Interventions ADLs/Self Care Home Management;Cryotherapy;Functional mobility training;Stair training;Gait training;Moist Heat;Therapeutic activities;Therapeutic exercise;Balance training;Neuromuscular re-education;Patient/family education;Passive range of motion;Manual techniques;Taping   PT Next Visit Plan Osteoporosis and body mechanics education, LE strength, sit to stand, change of directions. gait with wider base of support   Recommended Other Services initial certification has been signed   Consulted and Agree with Plan of Care Patient      Patient will benefit from skilled therapeutic intervention in order to improve the following deficits and impairments:  Abnormal gait, Decreased activity tolerance, Decreased balance, Decreased strength, Postural dysfunction, Impaired flexibility, Difficulty walking, Improper body mechanics  Visit Diagnosis: Muscle weakness (generalized)  Other abnormalities of gait and mobility     Problem List Patient Active Problem List   Diagnosis Date Noted  . Ecchymoses, spontaneous 07/29/2016  . Varicose veins 07/29/2016  . Nasal congestion 07/29/2016  . Hyperglycemia 07/12/2015  . Paresthesia of hand 03/22/2014  . Undiagnosed cardiac murmurs 03/22/2014  . Palpitations 03/03/2012  . ANEMIA, MILD 07/27/2010  . VENOUS  INSUFFICIENCY, MILD 07/27/2010  . OTHER CONSTIPATION 04/17/2010  . Essential hypertension 04/06/2010  . RHINOCONJUNCTIVITIS, ALLERGIC 04/04/2010  . PERSONAL HISTORY OTHER DISORDER URINARY SYSTEM 01/25/2010  . OTHER DYSPHAGIA 04/11/2009  . ESOPHAGEAL REFLUX 01/24/2009  . OTHER AND UNSPECIFIED HYPERLIPIDEMIA 11/23/2008  . Osteoporosis 11/23/2008  . NONSPECIFIC ABNORMAL ELECTROCARDIOGRAM 11/23/2008     Claiborne Billings  Garnetta Buddy, PT 08/11/17 4:14 PM  [facility] [address] [place], [place], [address] Phone: [phone]   Fax:  [phone]  Name: Alicia Boyd MRN: [id number] Date of Birth: 14-Aug-1930

## 2017-08-11 NOTE — Patient Instructions (Signed)
DO's and DON'T's   Avoid and/or Minimize positions of forward bending ( flexion)  Side bending and rotation of the trunk  Especially when movements occur together   When your back aches:   Don't sit down   Lie down on your back with a small pillow under your head and one under your knees or as outlined by our therapist. Or, lie in the 90/90 position ( on the floor with your feet and legs on the sofa with knees and hips bent to 90 degrees)  Tying or putting on your shoes:   Don't bend over to tie your shoes or put on socks.  Instead, bring one foot up, cross it over the opposite knee and bend forward (hinge) at the hips to so the task.  Keep your back straight.  If you cannot do this safely, then you need to use long handled assistive devices such as a shoehorn and sock puller.  Exercising:  Don't engage in ballistic types of exercise routines such as high-impact aerobics or jumping rope  Don't do exercises in the gym that bring you forward (abdominal crunches, sit-ups, touching your  toes, knee-to-chest, straight leg raising.)  Follow a regular exercise program that includes a variety of different weight-bearing activities, such as low-impact aerobics, T' ai chi or walking as your physical therapist advises  Do exercises that emphasize return to normal body alignment and strengthening of the muscles that keep your back straight, as outlined in this program or by your therapist  Household tasks:  Don't reach unnecessarily or twist your trunk when mopping, sweeping, vacuuming, raking, making beds, weeding gardens, getting objects ou of cupboards, etc.  Keep your broom, mop, vacuum, or rake close to you and mover your whole body as you move them. Walk over to the area on which you are working. Arrange kitchen, bathroom, and bedroom shelves so that frequently used items may be reached without excessive bending, twisting, and reaching.  Use a  sturdy stool if necessary.  Don't bend from the waist to pick up something up  Off the floor, out of the trunk of your car, or to brush your teeth, wash your face, etc.   Bend at the knees, keeping back straight as possible. Use a reacher if necessary.   Prevention of fracture is the so-called "BOTTOm -Line" in the management of OSTEOPOROSIS. Do not take unnecessary chances in movement. Once a compression fracture occurs, the process is very difficult to control; one fracture is frequently followed by many more.   Fall Prevention and Home Safety Falls cause injuries and can affect all age groups. It is possible to use preventive measures to significantly decrease the likelihood of falls. There are many simple measures which can make your home safer and prevent falls. OUTDOORS  Repair cracks and edges of walkways and driveways.  Remove high doorway thresholds.  Trim shrubbery on the main path into your home.  Have good outside lighting.  Clear walkways of tools, rocks, debris, and clutter.  Check that handrails are not broken and are securely fastened. Both sides of steps should have handrails.  Have leaves, snow, and ice cleared regularly.  Use sand or salt on walkways during winter months.  In the garage, clean up grease or oil spills. BATHROOM  Install night lights.  Install grab bars by the toilet and in the tub and shower.  Use non-skid mats or decals in the tub or shower.  Place a plastic non-slip stool in the shower to sit  on, if needed.  Keep floors dry and clean up all water on the floor immediately.  Remove soap buildup in the tub or shower on a regular basis.  Secure bath mats with non-slip, double-sided rug tape.  Remove throw rugs and tripping hazards from the floors. BEDROOMS  Install night lights.  Make sure a bedside light is easy to reach.  Do not use oversized bedding.  Keep a telephone by your bedside.  Have a firm chair with side arms to use  for getting dressed.  Remove throw rugs and tripping hazards from the floor. KITCHEN  Keep handles on pots and pans turned toward the center of the stove. Use back burners when possible.  Clean up spills quickly and allow time for drying.  Avoid walking on wet floors.  Avoid hot utensils and knives.  Position shelves so they are not too high or low.  Place commonly used objects within easy reach.  If necessary, use a sturdy step stool with a grab bar when reaching.  Keep electrical cables out of the way.  Do not use floor polish or wax that makes floors slippery. If you must use wax, use non-skid floor wax.  Remove throw rugs and tripping hazards from the floor. STAIRWAYS  Never leave objects on stairs.  Place handrails on both sides of stairways and use them. Fix any loose handrails. Make sure handrails on both sides of the stairways are as long as the stairs.  Check carpeting to make sure it is firmly attached along stairs. Make repairs to worn or loose carpet promptly.  Avoid placing throw rugs at the top or bottom of stairways, or properly secure the rug with carpet tape to prevent slippage. Get rid of throw rugs, if possible.  Have an electrician put in a light switch at the top and bottom of the stairs. OTHER FALL PREVENTION TIPS  Wear low-heel or rubber-soled shoes that are supportive and fit well. Wear closed toe shoes.  When using a stepladder, make sure it is fully opened and both spreaders are firmly locked. Do not climb a closed stepladder.  Add color or contrast paint or tape to grab bars and handrails in your home. Place contrasting color strips on first and last steps.  Learn and use mobility aids as needed. Install an electrical emergency response system.  Turn on lights to avoid dark areas. Replace light bulbs that burn out immediately. Get light switches that glow.  Arrange furniture to create clear pathways. Keep furniture in the same place.  Firmly  attach carpet with non-skid or double-sided tape.  Eliminate uneven floor surfaces.  Select a carpet pattern that does not visually hide the edge of steps.  Be aware of all pets. OTHER HOME SAFETY TIPS  Set the water temperature for 120 F (48.8 C).  Keep emergency numbers on or near the telephone.  Keep smoke detectors on every level of the home and near sleeping areas. Document Released: 12/06/2002 Document Revised: 06/16/2012 Document Reviewed: 03/06/2012 [facility] Patient Information 2015 [place], [place]. This information is not intended to replace advice given to you by your health care provider. Make sure you discuss any questions you have with your health care provider.  [place] [address] [place], [place] [address] Phone # [phone] Fax [phone]

## 2017-08-13 ENCOUNTER — Encounter: Payer: Self-pay | Admitting: Physical Therapy

## 2017-08-13 ENCOUNTER — Ambulatory Visit: Payer: MEDICARE | Admitting: Physical Therapy

## 2017-08-13 DIAGNOSIS — R2689 Other abnormalities of gait and mobility: Secondary | ICD-10-CM

## 2017-08-13 DIAGNOSIS — M6281 Muscle weakness (generalized): Secondary | ICD-10-CM | POA: Diagnosis not present

## 2017-08-13 NOTE — Therapy (Signed)
[facility] [address] [place], [place], [address] Phone: [phone]   Fax:  [phone]  Physical Therapy Treatment  Patient Details  Name: Alicia Boyd MRN: [id number] Date of Birth: 02/11/1930 Referring Provider: [name], [facility]  Encounter Date: 08/13/2017      PT End of Session - 08/13/17 1517    Visit Number 4   Number of Visits 10   Date for PT Re-Evaluation 09/10/17   PT Start Time 9417   PT Stop Time 1553   PT Time Calculation (min) 39 min   Activity Tolerance Patient tolerated treatment well;Patient limited by fatigue   Behavior During Therapy [facility] for tasks assessed/performed      Past Medical History:  Diagnosis Date  . Aortic heart murmur    AS/AI on 2D ECHO ; SBE prophylaxis Rxed  . Chest pain 2007 ; 2011   negative cardiac assessment  . Hyperlipidemia   . Interstitial cystitis    Resolved  . Osteoporosis    T score - 3.0 @ femoral neck; Fosamax affected swallowing  . Postmenopausal    No PMH or HRT    Past Surgical History:  Procedure Laterality Date  . COLONOSCOPY  2011   Dr Sharlett Iles    There were no vitals filed for this visit.      Subjective Assessment - 08/13/17 1518    Subjective I think the therapy is really helping. I feel less wobbly.    Pertinent History Osteoporosis (t score -3.0), fall 02/2016   Patient Stated Goals imrpove balance, prevent falls   Currently in Pain? No/denies   Multiple Pain Sites No                         [facility] Adult PT Treatment/Exercise - 08/13/17 0001      Knee/Hip Exercises: Aerobic   Nustep L2 x 8 min  PTA present     Knee/Hip Exercises: Standing   Heel Raises Both;2 sets;10 reps   Knee Flexion Strengthening;Both;1 set;10 reps   Knee Flexion Limitations 1#  Vc to slow down for control   Hip Abduction Stengthening;Both;2 sets;10 reps   Abduction Limitations 1#   Hip Extension Stengthening;Both;1 set;10  reps;Knee straight  VC for LTLE for control   Extension Limitations 1# added   Rebounder weight shifting 3 ways x 1 minute each   Other Standing Knee Exercises Walking 6  laps of clinic with VC to stand taller and engage her core     Knee/Hip Exercises: Seated   Long Arc Quad AROM;Strengthening;Both;2 sets;10 reps   Long Arc Quad Weight 1 lbs.   Marching AROM;Strengthening;Both;2 sets;10 reps   Marching Limitations 1   Sit to Sand 2 sets;10 reps;without UE support  VC to widen BOS                  PT Short Term Goals - 08/11/17 1534      PT SHORT TERM GOAL #1   Title be independent in initial HEP   Time 4   Period Weeks   Status On-going     PT SHORT TERM GOAL #2   Title perform change of direction with gait and demonstrate stability with this   Time 4   Period Weeks   Status On-going     PT SHORT TERM GOAL #3   Title perform sit to stand without back of legs against chair without UE support   Time 4  Period Weeks   Status On-going     PT SHORT TERM GOAL #4   Title verbalize and demonstrate understanding of fall prevention and body mechanics modification needed due to osteoporosis   Time --   Period --   Status Achieved           PT Long Term Goals - 07/16/17 1523      PT LONG TERM GOAL #1   Title be independent in advanced HEP   Time 8   Period Weeks   Status New     PT LONG TERM GOAL #2   Title demonstrate normalized base of support and upright trunk posture with gait on level surface   Time 8   Period Weeks   Status New     PT LONG TERM GOAL #3   Title perform 5x sit to stand in < or = to 10 seconds without use of backs of legs on chair to stabilize   Time 8   Period Weeks   Status New     PT LONG TERM GOAL #4   Title report 50% improved confifidence with gait on level surface   Time 8   Period Weeks   Status New               Plan - 08/13/17 1517    Clinical Impression Statement Pt came into session with fatigue mainly  from being active all day today. Only some slight changes in exercises today due to fatigue, otherwise pt did feel slightly more "peppy" at the end of the session.    Rehab Potential Good   PT Frequency 2x / week   PT Duration 8 weeks   Consulted and Agree with Plan of Care Patient      Patient will benefit from skilled therapeutic intervention in order to improve the following deficits and impairments:  Abnormal gait, Decreased activity tolerance, Decreased balance, Decreased strength, Postural dysfunction, Impaired flexibility, Difficulty walking, Improper body mechanics  Visit Diagnosis: Muscle weakness (generalized)  Other abnormalities of gait and mobility     Problem List Patient Active Problem List   Diagnosis Date Noted  . Ecchymoses, spontaneous 07/29/2016  . Varicose veins 07/29/2016  . Nasal congestion 07/29/2016  . Hyperglycemia 07/12/2015  . Paresthesia of hand 03/22/2014  . Undiagnosed cardiac murmurs 03/22/2014  . Palpitations 03/03/2012  . ANEMIA, MILD 07/27/2010  . VENOUS INSUFFICIENCY, MILD 07/27/2010  . OTHER CONSTIPATION 04/17/2010  . Essential hypertension 04/06/2010  . RHINOCONJUNCTIVITIS, ALLERGIC 04/04/2010  . PERSONAL HISTORY OTHER DISORDER URINARY SYSTEM 01/25/2010  . OTHER DYSPHAGIA 04/11/2009  . ESOPHAGEAL REFLUX 01/24/2009  . OTHER AND UNSPECIFIED HYPERLIPIDEMIA 11/23/2008  . Osteoporosis 11/23/2008  . NONSPECIFIC ABNORMAL ELECTROCARDIOGRAM 11/23/2008    Keyasha Miah, PTA 08/13/2017, 3:53 PM  [facility] [address], [place], [place], [address] Phone: [phone]   Fax:  [phone]  Name: Alicia Boyd MRN: [id number] Date of Birth: [birth year]

## 2017-08-18 ENCOUNTER — Encounter: Payer: Self-pay | Admitting: Physical Therapy

## 2017-08-18 ENCOUNTER — Ambulatory Visit: Payer: MEDICARE | Admitting: Physical Therapy

## 2017-08-18 DIAGNOSIS — R2689 Other abnormalities of gait and mobility: Secondary | ICD-10-CM | POA: Diagnosis not present

## 2017-08-18 DIAGNOSIS — M6281 Muscle weakness (generalized): Secondary | ICD-10-CM

## 2017-08-18 NOTE — Patient Instructions (Addendum)
The below exercises are to be done WITHOUT resistance band - disregard band in picture and instructions - try doing using hands as little as possible but stand close to something to hold as needed.   EXTENSION: Standing - Resistance Band (Active)    Stand, both feet flat. Against yellow resistance band, draw right leg behind body as far as possible. Complete _1__ sets of _10__ repetitions. Perform _1__ sessions per day.  http://gtsc.exer.us/83   Copyright  VHI. All rights reserved.  Balance, Proprioception: Hip Flexion With Tubing    With tubing attached to ankle of uninvolved leg, swing leg forward. Return. Repeat _10___ times each side. Do _1___ sessions per day.  http://cc.exer.us/18   Copyright  VHI. All rights reserved.  ABDUCTION: Standing - Resistance Band (Active)    Stand, feet flat. Against yellow resistance band, lift right leg out to side. Complete _1__ sets of _10__ repetitions. Perform _1__ sessions per day.  http://gtsc.exer.us/117   Copyright  VHI. All rights reserved.     SINGLE LEG STANCE - SLS  Stand on one leg and maintain your balance. Make sure you have something to grab if needed, can do at the counter, near chair or couch. Hold as long as able, repeat 5x each side 1x/day

## 2017-08-18 NOTE — Therapy (Signed)
[facility] [address], [place], [place], [address] Phone: [phone]   Fax:  [phone]  Physical Therapy Treatment  Patient Details  Name: Alicia Boyd MRN: [id number] Date of Birth: 01/30/1930 Referring Provider: [name], [facility]  Encounter Date: 08/18/2017      PT End of Session - 08/18/17 1449    Visit Number 5   Number of Visits 10   Date for PT Re-Evaluation 09/10/17   PT Start Time 5732   PT Stop Time 1530   PT Time Calculation (min) 43 min   Activity Tolerance Patient tolerated treatment well;Patient limited by fatigue   Behavior During Therapy [facility] for tasks assessed/performed      Past Medical History:  Diagnosis Date  . Aortic heart murmur    AS/AI on 2D ECHO ; SBE prophylaxis Rxed  . Chest pain 2007 ; 2011   negative cardiac assessment  . Hyperlipidemia   . Interstitial cystitis    Resolved  . Osteoporosis    T score - 3.0 @ femoral neck; Fosamax affected swallowing  . Postmenopausal    No PMH or HRT    Past Surgical History:  Procedure Laterality Date  . COLONOSCOPY  2011   Dr Sharlett Iles    There were no vitals filed for this visit.      Subjective Assessment - 08/18/17 1454    Subjective I feel a lot better and feel like I can do the exercises at home.  It is hard to get my husband here and it is hard for him to sit out there.   Pertinent History Osteoporosis (t score -3.0), fall 02/2016   Patient Stated Goals imrpove balance, prevent falls            [facility] PT Assessment - 08/18/17 0001      Standardized Balance Assessment   Five times sit to stand comments  10     Timed Up and Go Test   Normal TUG (seconds) 11                     [facility] Adult PT Treatment/Exercise - 08/18/17 0001      Neuro Re-ed    Neuro Re-ed Details  standing exercises without UE support     Knee/Hip Exercises: Aerobic   Nustep L2 x 8 min  PTA present     Knee/Hip  Exercises: Standing   Heel Raises Both;2 sets;10 reps   Knee Flexion Strengthening;Both;1 set;10 reps   Knee Flexion Limitations 1#  Vc to slow down for control   Hip Abduction Stengthening;Both;2 sets;10 reps   Abduction Limitations 1#   Hip Extension Stengthening;Both;1 set;10 reps;Knee straight  VC for LTLE for control   Extension Limitations 1# added   SLS both leg with cues to shift weight - no UE   Rebounder weight shifting 3 ways x 1 minute each     Knee/Hip Exercises: Seated   Long Arc Quad AROM;Strengthening;Both;2 sets;10 reps   Long Arc Quad Weight 2 lbs.   Marching AROM;Strengthening;Both;2 sets;10 reps   Marching Limitations 2   Sit to Sand 2 sets;10 reps;without UE support  able to perform without LE on back of chair                PT Education - 08/18/17 1521    Education provided Yes   Education Details standing exercises   Person(s) Educated Patient   Methods Explanation;Demonstration;Handout;Tactile cues;Verbal cues   Comprehension Verbalized understanding;Returned  demonstration          PT Short Term Goals - 09/05/17 1449      PT SHORT TERM GOAL #1   Title be independent in initial HEP   Time 4   Period Weeks   Status Achieved     PT SHORT TERM GOAL #2   Title perform change of direction with gait and demonstrate stability with this   Baseline improved TUG time   Time 4   Period Weeks   Status Achieved     PT SHORT TERM GOAL #3   Title perform sit to stand without back of legs against chair without UE support   Time 4   Period Weeks   Status Achieved     PT SHORT TERM GOAL #4   Title verbalize and demonstrate understanding of fall prevention and body mechanics modification needed due to osteoporosis   Time 4   Period Weeks   Status Achieved           PT Long Term Goals - 09-05-17 1451      PT LONG TERM GOAL #1   Title be independent in advanced HEP   Time 8   Period Weeks   Status Achieved     PT LONG TERM GOAL #2    Title demonstrate normalized base of support and upright trunk posture with gait on level surface   Baseline improved   Time 8   Period Weeks   Status Not Met     PT LONG TERM GOAL #3   Title perform 5x sit to stand in < or = to 10 seconds without use of backs of legs on chair to stabilize   Baseline 10 sec   Time 8   Period Weeks   Status Achieved     PT LONG TERM GOAL #4   Title report 50% improved confifidence with gait on level surface   Baseline 80%   Period Weeks   Status Achieved               Plan - September 05, 2017 1456    Clinical Impression Statement Pt has demonstrated that she has improved balance and she is able to do exercises at home.  Pt met most of her goals.  Pt would continue to benefit from skilled PT for further improvements in balance, strength, and posture, but she is having difficulty getting to therapy.  Pt was educated on advanced HEP and was able to perform correctly in the gym. Pt is safe to discharge with HEP at this time.   Rehab Potential Good   PT Treatment/Interventions ADLs/Self Care Home Management;Cryotherapy;Functional mobility training;Stair training;Gait training;Moist Heat;Therapeutic activities;Therapeutic exercise;Balance training;Neuromuscular re-education;Patient/family education;Passive range of motion;Manual techniques;Taping   PT Next Visit Plan discharge today   Consulted and Agree with Plan of Care Patient      Patient will benefit from skilled therapeutic intervention in order to improve the following deficits and impairments:  Abnormal gait, Decreased activity tolerance, Decreased balance, Decreased strength, Postural dysfunction, Impaired flexibility, Difficulty walking, Improper body mechanics  Visit Diagnosis: Muscle weakness (generalized)  Other abnormalities of gait and mobility       G-Codes - 09-05-2017 1620    Functional Assessment Tool Used (Outpatient Only) TUG, clinical   Functional Limitation Mobility: Walking and  moving around   Mobility: Walking and Moving Around Current Status ([id number]) At least 40 percent but less than 60 percent impaired, limited or restricted   Mobility: Walking and Moving Around Goal Status [phone])  At least 40 percent but less than 60 percent impaired, limited or restricted   Mobility: Walking and Moving Around Discharge Status [phone]) At least 40 percent but less than 60 percent impaired, limited or restricted      Problem List Patient Active Problem List   Diagnosis Date Noted  . Ecchymoses, spontaneous 07/29/2016  . Varicose veins 07/29/2016  . Nasal congestion 07/29/2016  . Hyperglycemia 07/12/2015  . Paresthesia of hand 03/22/2014  . Undiagnosed cardiac murmurs 03/22/2014  . Palpitations 03/03/2012  . ANEMIA, MILD 07/27/2010  . VENOUS INSUFFICIENCY, MILD 07/27/2010  . OTHER CONSTIPATION 04/17/2010  . Essential hypertension 04/06/2010  . RHINOCONJUNCTIVITIS, ALLERGIC 04/04/2010  . PERSONAL HISTORY OTHER DISORDER URINARY SYSTEM 01/25/2010  . OTHER DYSPHAGIA 04/11/2009  . ESOPHAGEAL REFLUX 01/24/2009  . OTHER AND UNSPECIFIED HYPERLIPIDEMIA 11/23/2008  . Osteoporosis 11/23/2008  . NONSPECIFIC ABNORMAL ELECTROCARDIOGRAM 11/23/2008    Wayne Both 08/18/2017, 4:23 PM  [facility] [address] [place], [place], [address] Phone: [phone]   Fax:  [phone]  Name: NARYIAH SCHLEY MRN: [id number] Date of Birth: 1930-03-11  PHYSICAL THERAPY DISCHARGE SUMMARY  Visits from Start of Care: 5  Current functional level related to goals / functional outcomes: See above   Remaining deficits: See above   Education / Equipment: HEP Plan: Patient agrees to discharge.  Patient goals were partially met. Patient is being discharged due to being pleased with the current functional level.  ?????         [employer], PT 08/18/17 4:23 PM

## 2017-08-25 ENCOUNTER — Encounter: Payer: MEDICARE | Admitting: Physical Therapy

## 2017-08-27 ENCOUNTER — Ambulatory Visit (INDEPENDENT_AMBULATORY_CARE_PROVIDER_SITE_OTHER): Payer: MEDICARE | Admitting: Adult Health

## 2017-08-27 ENCOUNTER — Encounter: Payer: Self-pay | Admitting: Adult Health

## 2017-08-27 VITALS — BP 156/70 | Temp 98.6°F | Ht 64.0 in | Wt 119.0 lb

## 2017-08-27 DIAGNOSIS — Z23 Encounter for immunization: Secondary | ICD-10-CM | POA: Diagnosis not present

## 2017-08-27 DIAGNOSIS — G5601 Carpal tunnel syndrome, right upper limb: Secondary | ICD-10-CM

## 2017-08-27 NOTE — Progress Notes (Signed)
Subjective:    Patient ID: Alicia Boyd, female    DOB: [birth year], 83 y.o.   MRN: [id number]  HPI  83 year old female who  has a past medical history of Aortic heart murmur; Chest pain (2007 ; 2011); Hyperlipidemia; Interstitial cystitis; Osteoporosis; and Postmenopausal.  She presents to the office today for continued right hand numbness. She reports that this issue has been present for multiple years at this point. The reports numbness and paralysis in her thumb, index finger and middle finger of right hand. She denies any worsening symptoms at night. Feels as though she has decreased grip strength in right hand.    Review of Systems See HPI   Past Medical History:  Diagnosis Date  . Aortic heart murmur    AS/AI on 2D ECHO ; SBE prophylaxis Rxed  . Chest pain 2007 ; 2011   negative cardiac assessment  . Hyperlipidemia   . Interstitial cystitis    Resolved  . Osteoporosis    T score - 3.0 @ femoral neck; Fosamax affected swallowing  . Postmenopausal    No PMH or HRT    Social History   Social History  . Marital status: Married    Spouse name: N/A  . Number of children: N/A  . Years of education: N/A   Occupational History  . Not on file.   Social History Main Topics  . Smoking status: Never Smoker  . Smokeless tobacco: Never Used  . Alcohol use No  . Drug use: No  . Sexual activity: Not on file   Other Topics Concern  . Not on file   Social History Narrative  . No narrative on file    Past Surgical History:  Procedure Laterality Date  . COLONOSCOPY  2011   Dr Sharlett Iles    Family History  Problem Relation Age of Onset  . Diabetes Father        Pancreatitic insufficiency post MVA  . Hypertension Mother   . Osteoporosis Mother   . Heart disease Mother        AF  . Thyroid disease Sister        hypothyroidism  . Coronary artery disease Sister        AF  . Stroke Sister 21       also pulmonary disease    Allergies  Allergen Reactions  .  Amitid [Amitriptyline Hcl]     Patient experienced a dizzy, spinning feeling   . Metronidazole     REACTION: throat swells  . Penicillins     REACTION: rash & fever  . Alendronate Sodium     REACTION: unspecified  . Clarithromycin     GI intolerance  . Flonase [Fluticasone Propionate]     Slight Headache   . Montelukast Other (See Comments)    unknown  . Promethazine Hcl     REACTION: unspecified  . Robitussin (Alcohol Free) [Guaifenesin] Other (See Comments)    unknown hyperactive  . Spironolactone     09/21/13 weakness in legs  . Singulair [Montelukast Sodium]     "spacey"; ineffective    Current Outpatient Prescriptions on File Prior to Visit  Medication Sig Dispense Refill  . amLODipine (NORVASC) 5 MG tablet Take 1 tablet (5 mg total) by mouth daily. 90 tablet 3  . Calcium Carbonate (CALCIUM 600 PO) Take by mouth daily.    . Cholecalciferol (VITAMIN D3) 1000 UNITS CAPS Take by mouth daily.    . fexofenadine (ALLEGRA) 180 MG tablet  Take 180 mg by mouth daily as needed.     Marland Kitchen ipratropium (ATROVENT) 0.03 % nasal spray USE 2 SPRAYS IN EACH NOSTRIL EVERY 12 HOURS. DO NOT USE MORE THAN 5 DAYS 60 mL 11  . metoprolol succinate (TOPROL-XL) 25 MG 24 hr tablet TAKE 1 TABLET BY MOUTH EVERY DAY 90 tablet 3  . Multiple Vitamin (MULTIVITAMIN) capsule Take 1 capsule by mouth daily.       No current facility-administered medications on file prior to visit.     BP (!) 156/70 (BP Location: Right Arm)   Temp 98.6 F (37 C) (Oral)   Ht 5\' 4"  (1.626 m)   Wt 119 lb (54 kg)   BMI 20.43 kg/m       Objective:   Physical Exam  Constitutional: She is oriented to person, place, and time. She appears well-developed and well-nourished. No distress.  Cardiovascular: Normal rate, regular rhythm, normal heart sounds and intact distal pulses.  Exam reveals no gallop and no friction rub.   No murmur heard. Pulmonary/Chest: Effort normal and breath sounds normal. No respiratory distress. She has  no wheezes. She has no rales. She exhibits no tenderness.  Musculoskeletal:  Decreased grip strength in right hand Good cap refill  Normal distal pulses     Neurological: She is alert and oriented to person, place, and time.  Negative tinels  Positive Phalens   Skin: Skin is warm. No rash noted. She is not diaphoretic. No erythema. No pallor.  Psychiatric: She has a normal mood and affect. Her behavior is normal. Judgment and thought content normal.  Nursing note and vitals reviewed.     Assessment & Plan:  1. Carpal tunnel syndrome of right wrist - We spoke about multiple treatments for carpal tunnel. She would like to see Dr. Tamala Julian for treatment  - Ambulatory referral to [place], NP

## 2017-08-27 NOTE — Addendum Note (Signed)
Addended by: Miles Costain T on: 08/27/2017 03:35 PM   Modules accepted: Orders

## 2017-08-27 NOTE — Patient Instructions (Signed)
Someone form [facility] will call you to schedule your appointment with Dr. Tamala Julian

## 2017-08-28 ENCOUNTER — Telehealth: Payer: Self-pay | Admitting: Family Medicine

## 2017-08-28 NOTE — Telephone Encounter (Signed)
Pt called regarding her appt for 9/13, she would like to know if an injection is given that day if she will be abe to go home and use her hand as normal or will she have restrictions because she has to go home and  take care of handicap husband. Please call back

## 2017-08-29 NOTE — Telephone Encounter (Signed)
Likely would be fine.  Hand may be numb after injection for up to 6 hours  As long as only 1 hand done then likely will be fine.

## 2017-08-29 NOTE — Telephone Encounter (Signed)
Discussed with pt

## 2017-09-11 ENCOUNTER — Ambulatory Visit: Payer: MEDICARE | Admitting: Family Medicine

## 2017-09-18 NOTE — Progress Notes (Addendum)
Subjective:   Alicia Boyd is a 83 y.o. female who presents for Medicare Annual (Subsequent) preventive examination.  PMH: postmenopausal Osteoporosis/ no fractures/ takes calcium per day; Centrum silver and Vit D; declines further dexa scans; she is not going to take medicine / declines today  Walks and takes calicium No recent falls  Hyperlipidemia/ lipid panel ratio 3;    Tobacco; never smoker EtOH no   Medications reviewed for issues; compliance; otc meds Takes probiotic  BMI: 20.6    Diet Takes natural alternatives probiotic  Wife cooks Basic nutrients;  Oatmeal and toast for breakfast [place] sandwich and fruit Fish; and vegetable    Exercise;  They walk a lot together Has a church family   Nurse Practitioner and PA are friends States they have lots of help    HOME SAFETY Long term plan reviewed  Home: level; barriers; or needs identified as bathroom railing up now Tub to get in for shower; they have bars and this helps Bedrooms on the 2nd floor but this Is no problem for them at this time.  Fall hx; no  Given education on "Fall Prevention in the Home" for more safety tips the patient can apply as appropriate.   Personal safety issues reviewed:  1.  for risk such as safe community / yes 2.  smoke detector/ yes/ and carbon monoxide  3.  firearms safety if applicable  4. protection when in the sun; does not get in the sun 5. Skin checks/ dr. [place] driving safety for seniors or any recent accidents./no   Dermatologist Dr. [place]   Risk for Depression reviewed: Any emotional problems? Anxious, depressed, irritable, sad or blue?  no Denies feeling depressed or hopeless; voices pleasure in daily life How many social activities have you been engaged in within the last 2 weeks? No   Cognitive; no Manages checkbook, medications; no failures of task Ad8 score reviewed for issues;  Issues making decisions; no  Less interest in hobbies  / activities" no  Repeats questions, stories; family complaining: NO  Trouble using ordinary gadgets; microwave; computer: no  Forgets the month or year: no  Mismanaging finances: no  Missing apt: no but does write them down  Daily problems with thinking of memory NO Ad8 score is 0  MMSE not appropriate unless AD8 score is > 2   Typical day; get up; breakfast, visit neighbors;  Reads the paper Afternoon; busy; volunteering Bed by 11;  Advanced Directive addressed;  Yes;            Objective:   Vitals: BP (!) 160/70   Ht 5\' 5"  (1.651 m)   Wt 124 lb (56.2 kg)   BMI 20.63 kg/m   Body mass index is 20.63 kg/m.  Checks BP at home and is not a problem; generally running 130/ 80 on average   Tobacco    History  Smoking Status  . Never Smoker  Smokeless Tobacco  . Not on file     Counseling given: Yes       Past Medical History:  Diagnosis Date  . Aortic heart murmur    AS/AI on 2D ECHO ; SBE prophylaxis Rxed  . Chest pain 2007 ; 2011   negative cardiac assessment  . Hyperlipidemia   . Interstitial cystitis    Resolved  . Osteoporosis    T score - 3.0 @ femoral neck; Fosamax affected swallowing  . Postmenopausal    No PMH or HRT  Past Surgical History:  Procedure Laterality Date  . COLONOSCOPY  2011   Dr Sharlett Iles         Family History  Problem Relation Age of Onset  . Diabetes Father     Pancreatitic insufficiency post MVA  . Hypertension Mother   . Osteoporosis Mother   . Heart disease Mother     AF  . Thyroid disease Sister     hypothyroidism  . Coronary artery disease Sister     AF  . Stroke Sister 70    also pulmonary disease       History  Sexual Activity  . Sexual activity: Not on file        Outpatient Encounter Prescriptions as of 08/20/2016  Medication Sig  . amLODipine (NORVASC) 5 MG tablet TAKE 1 TABLET BY MOUTH EVERY DAY  . Calcium Carbonate (CALCIUM 600 PO) Take by  mouth daily.  . Cholecalciferol (VITAMIN D3) 1000 UNITS CAPS Take by mouth daily.  . fexofenadine (ALLEGRA) 180 MG tablet Take 180 mg by mouth daily as needed.   . metoprolol succinate (TOPROL-XL) 25 MG 24 hr tablet TAKE 1 TABLET(25 MG) BY MOUTH DAILY  . Multiple Vitamin (MULTIVITAMIN) capsule Take 1 capsule by mouth daily.     No facility-administered encounter medications on file as of 08/20/2016.     Activities of Daily Living In your present state of health, do you have any difficulty performing the following activities: 08/20/2016  Hearing? N  Difficulty concentrating or making decisions? N  Walking or climbing stairs? N  Dressing or bathing? N  Doing errands, shopping? N  Preparing Food and eating ? N  Using the Toilet? N  In the past six months, have you accidently leaked urine? N  Do you have problems with loss of bowel control? N  Managing your Medications? N  Managing your Finances? N  Housekeeping or managing your Housekeeping? N  Some recent data might be hidden    Patient Care Team: Binnie Rail, MD as PCP - General (Internal Medicine)    Assessment:    Exercise Activities and Dietary recommendations Current Exercise Habits: Home exercise routine, Type of exercise: Other - see comments (stays active )          Goals             . patient            Would like to walk more but is dependent on spouse       Fall Risk Fall Risk  08/20/2016 12/20/2014 06/24/2013  Falls in the past year? No No No   Depression Screen PHQ 2/9 Scores 08/20/2016 12/20/2014 06/24/2013  PHQ - 2 Score 0 0 0     Cognitive Testing MMSE - Mini Mental State Exam 08/20/2016  Not completed: (No Data)   no issues; cares for self and spouse and remains active in the church      Immunization History  Administered Date(s) Administered  . Influenza Split 12/10/2011, 09/29/2012  . Influenza, High Dose Seasonal PF 09/21/2014  . Influenza,inj,Quad PF,36+ Mos  10/08/2013, 09/12/2015  . Pneumococcal Conjugate-13 08/22/2015  . Pneumococcal Polysaccharide-23 11/23/2008  . Td 03/23/2004  . Tdap 07/12/2015   Screening Tests     Health Maintenance  Topic Date Due  . ZOSTAVAX  10/31/1992  . INFLUENZA VACCINE  07/30/2016  . DEXA SCAN  08/18/2017 (Originally 10/31/1997)  . TETANUS/TDAP  07/11/2025  . PNA vac Low Risk Adult  Completed  Plan:    PCP Notes  Health Maintenance Deferred dexa' states she is not going to have medicine  Educated regarding calcium and Vit; and exercise  Abnormal Screens  None   Referrals  Given information on LTC system Also given information on the [place] for her spouse in lieu of adding additional ltc otpions  Patient concerns; None   Nurse Concerns; Caregiver for spouse. Does well; very patient;  States they have a great support system Nephew is HCPOA but is out of town  Next PCP apt Was seen late August     During the course of the visit the patient was educated and counseled about the following appropriate screening and preventive services:   Vaccines to include Pneumoccal, Influenza, Hepatitis B, Td, Zostavax, HCV  Electrocardiogram  Cardiovascular Disease  Colorectal cancer screening  Bone density screening  Diabetes screening  Glaucoma screening  Mammography/PAP  Nutrition counseling   Patient Instructions (the written plan) was given to the patient.   [id number],JDBZM, RN                   08/20/2016         Objective:     Vitals: BP 130/80   Pulse 66   Ht 5\' 4"  (1.626 m)   Wt 119 lb 5 oz (54.1 kg)   SpO2 98%   BMI 20.48 kg/m   Body mass index is 20.48 kg/m.   Tobacco History  Smoking Status  . Never Smoker  Smokeless Tobacco  . Never Used     Counseling given: Yes   Past Medical History:  Diagnosis Date  . Aortic heart murmur    AS/AI on 2D ECHO ; SBE prophylaxis Rxed  . Chest pain 2007 ; 2011   negative cardiac assessment  . Hyperlipidemia   .  Interstitial cystitis    Resolved  . Osteoporosis    T score - 3.0 @ femoral neck; Fosamax affected swallowing  . Postmenopausal    No PMH or HRT   Past Surgical History:  Procedure Laterality Date  . COLONOSCOPY  2011   Dr Sharlett Iles   Family History  Problem Relation Age of Onset  . Diabetes Father        Pancreatitic insufficiency post MVA  . Hypertension Mother   . Osteoporosis Mother   . Heart disease Mother        AF  . Thyroid disease Sister        hypothyroidism  . Coronary artery disease Sister        AF  . Stroke Sister 65       also pulmonary disease   History  Sexual Activity  . Sexual activity: Not on file    Outpatient Encounter Prescriptions as of 09/19/2017  Medication Sig  . amLODipine (NORVASC) 5 MG tablet Take 1 tablet (5 mg total) by mouth daily.  . Calcium Carbonate (CALCIUM 600 PO) Take by mouth daily.  . Cholecalciferol (VITAMIN D3) 1000 UNITS CAPS Take by mouth daily.  . fexofenadine (ALLEGRA) 180 MG tablet Take 180 mg by mouth daily as needed.   Marland Kitchen ipratropium (ATROVENT) 0.03 % nasal spray USE 2 SPRAYS IN EACH NOSTRIL EVERY 12 HOURS. DO NOT USE MORE THAN 5 DAYS  . metoprolol succinate (TOPROL-XL) 25 MG 24 hr tablet TAKE 1 TABLET BY MOUTH EVERY DAY  . Multiple Vitamin (MULTIVITAMIN) capsule Take 1 capsule by mouth daily.     No facility-administered encounter medications on file as of 09/19/2017.  Activities of Daily Living In your present state of health, do you have any difficulty performing the following activities: 09/19/2017  Hearing? N  Vision? N  Difficulty concentrating or making decisions? N  Walking or climbing stairs? N  Comment they have bedroom upstairs and no issues going up and down stairs  Dressing or bathing? N  Doing errands, shopping? N  Preparing Food and eating ? N  Using the Toilet? N  In the past six months, have you accidently leaked urine? N  Do you have problems with loss of bowel control? N  Managing your  Medications? N  Managing your Finances? N  Housekeeping or managing your Housekeeping? N  Some recent data might be hidden    Patient Care Team: [name], NP as PCP - General (Family Medicine)    Assessment:     Exercise Activities and Dietary recommendations Current Exercise Habits: Home exercise routine, Type of exercise: walking (can't walk as much due to hernia)  Goals    . patient          Would like to walk more but is dependent on spouse       Fall Risk Fall Risk  09/19/2017 09/04/2016 08/20/2016 12/20/2014 06/24/2013  Falls in the past year? No No No No No  Comment - Emmi Telephone Survey: data to providers prior to load - - -   Depression Screen PHQ 2/9 Scores 09/19/2017 08/20/2016 12/20/2014 06/24/2013  PHQ - 2 Score 0 0 0 0     Cognitive Function MMSE - Mini Mental State Exam 09/19/2017 08/20/2016  Not completed: (No Data) (No Data)  no issues       Immunization History  Administered Date(s) Administered  . Influenza Split 12/10/2011, 09/29/2012  . Influenza, High Dose Seasonal PF 09/21/2014, 08/27/2016, 08/27/2017  . Influenza,inj,Quad PF,6+ Mos 10/08/2013, 09/12/2015  . Pneumococcal Conjugate-13 08/22/2015  . Pneumococcal Polysaccharide-23 11/23/2008  . Td 03/23/2004  . Tdap 07/12/2015   Screening Tests Health Maintenance  Topic Date Due  . DEXA SCAN  09/28/2018 (Originally 10/31/1997)  . TETANUS/TDAP  07/11/2025  . INFLUENZA VACCINE  Completed  . PNA vac Low Risk Adult  Completed      Plan:     PCP Notes   Health Maintenance Deferred dexa; declined   Abnormal Screens  None;  Good support at home   Referrals  Resources for long term care for spouse ([place] ) and other   Patient concerns; None   Nurse Concerns; As noted   Next PCP apt tbs       I have personally reviewed and noted the following in the patient's chart:   . Medical and social history . Use of alcohol, tobacco or illicit drugs  . Current medications and  supplements . Functional ability and status . Nutritional status . Physical activity . Advanced directives . List of other physicians . Hospitalizations, surgeries, and ER visits in previous 12 months . Vitals . Screenings to include cognitive, depression, and falls . Referrals and appointments  In addition, I have reviewed and discussed with patient certain preventive protocols, quality metrics, and best practice recommendations. A written personalized care plan for preventive services as well as general preventive health recommendations were provided to patient.     EHUDJ,SHFWY, RN  09/19/2017   Agree with assessment as above.  Notes reviewed in absence of primary provider who was out of office day of visit.  Eulas Post MD [facility]

## 2017-09-19 ENCOUNTER — Ambulatory Visit (INDEPENDENT_AMBULATORY_CARE_PROVIDER_SITE_OTHER): Payer: MEDICARE

## 2017-09-19 VITALS — BP 130/80 | HR 66 | Ht 64.0 in | Wt 119.3 lb

## 2017-09-19 DIAGNOSIS — Z0000 Encounter for general adult medical examination without abnormal findings: Secondary | ICD-10-CM

## 2017-09-19 NOTE — Patient Instructions (Addendum)
Alicia Boyd , Thank you for taking time to come for your Medicare Wellness Visit. I appreciate your ongoing commitment to your health goals. Please review the following plan we discussed and let me know if I can assist you in the future.   Thank you for coming in !   These are the goals we discussed: Goals    . patient          Would like to walk more but is dependent on spouse        This is a list of the screening recommended for you and due dates:  Health Maintenance  Topic Date Due  . DEXA scan (bone density measurement)  09/28/2018*  . Tetanus Vaccine  07/11/2025  . Flu Shot  Completed  . Pneumonia vaccines  Completed  *Topic was postponed. The date shown is not the original due date.    [email or web address]  [employer]; [phone] Management consultant; Information regarding [place] want to access their VA benefits can call [facility]  [address] [place], [place] [address]  Phone: [phone] Or [phone] Fax: [phone]  OR   They can to to the [facility] office and call [phone] and If the prompt starts, hit 0 and then ask for eligibility. They will assist you to sign up for medical benefits.    Health Maintenance for Postmenopausal Women Menopause is a normal process in which your reproductive ability comes to an end. This process happens gradually over a span of months to years, usually between the ages of 44 and 76. Menopause is complete when you have missed 12 consecutive menstrual periods. It is important to talk with your health care provider about some of the most common conditions that affect postmenopausal women, such as heart disease, cancer, and bone loss (osteoporosis). Adopting a healthy lifestyle and getting preventive care can help to promote your health and wellness. Those actions can also lower your chances of developing some of these common  conditions. What should I know about menopause? During menopause, you may experience a number of symptoms, such as:  Moderate-to-severe hot flashes.  Night sweats.  Decrease in sex drive.  Mood swings.  Headaches.  Tiredness.  Irritability.  Memory problems.  Insomnia.  Choosing to treat or not to treat menopausal changes is an individual decision that you make with your health care provider. What should I know about hormone replacement therapy and supplements? Hormone therapy products are effective for treating symptoms that are associated with menopause, such as hot flashes and night sweats. Hormone replacement carries certain risks, especially as you become older. If you are thinking about using estrogen or estrogen with progestin treatments, discuss the benefits and risks with your health care provider. What should I know about heart disease and stroke? Heart disease, heart attack, and stroke become more likely as you age. This may be due, in part, to the hormonal changes that your body experiences during menopause. These can affect how your body processes dietary fats, triglycerides, and cholesterol. Heart attack and stroke are both medical emergencies. There are many things that you can do to help prevent heart disease and stroke:  Have your blood pressure checked at least every 1-2 years. High blood pressure causes heart disease and increases the risk of stroke.  If you are 17-58 years old, ask your health care provider if you should take aspirin to prevent a heart attack or a stroke.  Do not use  any tobacco products, including cigarettes, chewing tobacco, or electronic cigarettes. If you need help quitting, ask your health care provider.  It is important to eat a healthy diet and maintain a healthy weight. ? Be sure to include plenty of vegetables, fruits, low-fat dairy products, and lean protein. ? Avoid eating foods that are high in solid fats, added sugars, or salt  (sodium).  Get regular exercise. This is one of the most important things that you can do for your health. ? Try to exercise for at least 150 minutes each week. The type of exercise that you do should increase your heart rate and make you sweat. This is known as moderate-intensity exercise. ? Try to do strengthening exercises at least twice each week. Do these in addition to the moderate-intensity exercise.  Know your numbers.Ask your health care provider to check your cholesterol and your blood glucose. Continue to have your blood tested as directed by your health care provider.  What should I know about cancer screening? There are several types of cancer. Take the following steps to reduce your risk and to catch any cancer development as early as possible. Breast Cancer  Practice breast self-awareness. ? This means understanding how your breasts normally appear and feel. ? It also means doing regular breast self-exams. Let your health care provider know about any changes, no matter how small.  If you are 13 or older, have a clinician do a breast exam (clinical breast exam or CBE) every year. Depending on your age, family history, and medical history, it may be recommended that you also have a yearly breast X-ray (mammogram).  If you have a family history of breast cancer, talk with your health care provider about genetic screening.  If you are at high risk for breast cancer, talk with your health care provider about having an MRI and a mammogram every year.  Breast cancer (BRCA) gene test is recommended for women who have family members with BRCA-related cancers. Results of the assessment will determine the need for genetic counseling and BRCA1 and for BRCA2 testing. BRCA-related cancers include these types: ? Breast. This occurs in males or females. ? Ovarian. ? Tubal. This may also be called fallopian tube cancer. ? Cancer of the abdominal or pelvic lining (peritoneal  cancer). ? Prostate. ? Pancreatic.  Cervical, Uterine, and Ovarian Cancer Your health care provider may recommend that you be screened regularly for cancer of the pelvic organs. These include your ovaries, uterus, and vagina. This screening involves a pelvic exam, which includes checking for microscopic changes to the surface of your cervix (Pap test).  For women ages 21-65, health care providers may recommend a pelvic exam and a Pap test every three years. For women ages 35-65, they may recommend the Pap test and pelvic exam, combined with testing for human papilloma virus (HPV), every five years. Some types of HPV increase your risk of cervical cancer. Testing for HPV may also be done on women of any age who have unclear Pap test results.  Other health care providers may not recommend any screening for nonpregnant women who are considered low risk for pelvic cancer and have no symptoms. Ask your health care provider if a screening pelvic exam is right for you.  If you have had past treatment for cervical cancer or a condition that could lead to cancer, you need Pap tests and screening for cancer for at least 20 years after your treatment. If Pap tests have been discontinued for you, your  risk factors (such as having a new sexual partner) need to be reassessed to determine if you should start having screenings again. Some women have medical problems that increase the chance of getting cervical cancer. In these cases, your health care provider may recommend that you have screening and Pap tests more often.  If you have a family history of uterine cancer or ovarian cancer, talk with your health care provider about genetic screening.  If you have vaginal bleeding after reaching menopause, tell your health care provider.  There are currently no reliable tests available to screen for ovarian cancer.  Lung Cancer Lung cancer screening is recommended for adults 88-3 years old who are at high risk for  lung cancer because of a history of smoking. A yearly low-dose CT scan of the lungs is recommended if you:  Currently smoke.  Have a history of at least 30 pack-years of smoking and you currently smoke or have quit within the past 15 years. A pack-year is smoking an average of one pack of cigarettes per day for one year.  Yearly screening should:  Continue until it has been 15 years since you quit.  Stop if you develop a health problem that would prevent you from having lung cancer treatment.  Colorectal Cancer  This type of cancer can be detected and can often be prevented.  Routine colorectal cancer screening usually begins at age 30 and continues through age 32.  If you have risk factors for colon cancer, your health care provider may recommend that you be screened at an earlier age.  If you have a family history of colorectal cancer, talk with your health care provider about genetic screening.  Your health care provider may also recommend using home test kits to check for hidden blood in your stool.  A small camera at the end of a tube can be used to examine your colon directly (sigmoidoscopy or colonoscopy). This is done to check for the earliest forms of colorectal cancer.  Direct examination of the colon should be repeated every 5-10 years until age 67. However, if early forms of precancerous polyps or small growths are found or if you have a family history or genetic risk for colorectal cancer, you may need to be screened more often.  Skin Cancer  Check your skin from head to toe regularly.  Monitor any moles. Be sure to tell your health care provider: ? About any new moles or changes in moles, especially if there is a change in a mole's shape or color. ? If you have a mole that is larger than the size of a pencil eraser.  If any of your family members has a history of skin cancer, especially at a young age, talk with your health care provider about genetic  screening.  Always use sunscreen. Apply sunscreen liberally and repeatedly throughout the day.  Whenever you are outside, protect yourself by wearing long sleeves, pants, a wide-brimmed hat, and sunglasses.  What should I know about osteoporosis? Osteoporosis is a condition in which bone destruction happens more quickly than new bone creation. After menopause, you may be at an increased risk for osteoporosis. To help prevent osteoporosis or the bone fractures that can happen because of osteoporosis, the following is recommended:  If you are 67-39 years old, get at least 1,000 mg of calcium and at least 600 mg of vitamin D per day.  If you are older than age 40 but younger than age 49, get at least 26,200  mg of calcium and at least 600 mg of vitamin D per day.  If you are older than age 36, get at least 1,200 mg of calcium and at least 800 mg of vitamin D per day.  Smoking and excessive alcohol intake increase the risk of osteoporosis. Eat foods that are rich in calcium and vitamin D, and do weight-bearing exercises several times each week as directed by your health care provider. What should I know about how menopause affects my mental health? Depression may occur at any age, but it is more common as you become older. Common symptoms of depression include:  Low or sad mood.  Changes in sleep patterns.  Changes in appetite or eating patterns.  Feeling an overall lack of motivation or enjoyment of activities that you previously enjoyed.  Frequent crying spells.  Talk with your health care provider if you think that you are experiencing depression. What should I know about immunizations? It is important that you get and maintain your immunizations. These include:  Tetanus, diphtheria, and pertussis (Tdap) booster vaccine.  Influenza every year before the flu season begins.  Pneumonia vaccine.  Shingles vaccine.  Your health care provider may also recommend other  immunizations. This information is not intended to replace advice given to you by your health care provider. Make sure you discuss any questions you have with your health care provider. Document Released: 02/07/2006 Document Revised: 07/05/2016 Document Reviewed: 09/19/2015 Elsevier Interactive Patient Education  2018 [place] in the Home Falls can cause injuries and can affect people from all age groups. There are many simple things that you can do to make your home safe and to help prevent falls. What can I do on the outside of my home?  Regularly repair the edges of walkways and driveways and fix any cracks.  Remove high doorway thresholds.  Trim any shrubbery on the main path into your home.  Use bright outdoor lighting.  Clear walkways of debris and clutter, including tools and rocks.  Regularly check that handrails are securely fastened and in good repair. Both sides of any steps should have handrails.  Install guardrails along the edges of any raised decks or porches.  Have leaves, snow, and ice cleared regularly.  Use sand or salt on walkways during winter months.  In the garage, clean up any spills right away, including grease or oil spills. What can I do in the bathroom?  Use night lights.  Install grab bars by the toilet and in the tub and shower. Do not use towel bars as grab bars.  Use non-skid mats or decals on the floor of the tub or shower.  If you need to sit down while you are in the shower, use a plastic, non-slip stool.  Keep the floor dry. Immediately clean up any water that spills on the floor.  Remove soap buildup in the tub or shower on a regular basis.  Attach bath mats securely with double-sided non-slip rug tape.  Remove throw rugs and other tripping hazards from the floor. What can I do in the bedroom?  Use night lights.  Make sure that a bedside light is easy to reach.  Do not use oversized bedding that drapes  onto the floor.  Have a firm chair that has side arms to use for getting dressed.  Remove throw rugs and other tripping hazards from the floor. What can I do in the kitchen?  Clean up any spills right away.  Avoid  walking on wet floors.  Place frequently used items in easy-to-reach places.  If you need to reach for something above you, use a sturdy step stool that has a grab bar.  Keep electrical cables out of the way.  Do not use floor polish or wax that makes floors slippery. If you have to use wax, make sure that it is non-skid floor wax.  Remove throw rugs and other tripping hazards from the floor. What can I do in the stairways?  Do not leave any items on the stairs.  Make sure that there are handrails on both sides of the stairs. Fix handrails that are broken or loose. Make sure that handrails are as long as the stairways.  Check any carpeting to make sure that it is firmly attached to the stairs. Fix any carpet that is loose or worn.  Avoid having throw rugs at the top or bottom of stairways, or secure the rugs with carpet tape to prevent them from moving.  Make sure that you have a light switch at the top of the stairs and the bottom of the stairs. If you do not have them, have them installed. What are some other fall prevention tips?  Wear closed-toe shoes that fit well and support your feet. Wear shoes that have rubber soles or low heels.  When you use a stepladder, make sure that it is completely opened and that the sides are firmly locked. Have someone hold the ladder while you are using it. Do not climb a closed stepladder.  Add color or contrast paint or tape to grab bars and handrails in your home. Place contrasting color strips on the first and last steps.  Use mobility aids as needed, such as canes, walkers, scooters, and crutches.  Turn on lights if it is dark. Replace any light bulbs that burn out.  Set up furniture so that there are clear paths. Keep the  furniture in the same spot.  Fix any uneven floor surfaces.  Choose a carpet design that does not hide the edge of steps of a stairway.  Be aware of any and all pets.  Review your medicines with your healthcare provider. Some medicines can cause dizziness or changes in blood pressure, which increase your risk of falling. Talk with your health care provider about other ways that you can decrease your risk of falls. This may include working with a physical therapist or trainer to improve your strength, balance, and endurance. This information is not intended to replace advice given to you by your health care provider. Make sure you discuss any questions you have with your health care provider. Document Released: 12/06/2002 Document Revised: 05/14/2016 Document Reviewed: 01/20/2015 Elsevier Interactive Patient Education  2017 [employer].

## 2017-09-23 NOTE — Progress Notes (Signed)
Alicia Boyd, [place] [address] Phone: [phone] Subjective:    I'm seeing this patient by the request  of:  [name], NP   CC: Hand pain  XMI:[id number]  Alicia Boyd is a 83 y.o. female coming in with complaint of hand pain. Patient explains that her right hand experiences numbness and tingling.   Onset- 3 years Location-Patient states that it seems to be the first 3 fingers Duration- patient states that it started to be intermittent and now seems to be more constant Character- Sharp, numbness, tingling  Aggravating factors- Using her hands a lot  Reliving factors- Sleeps in a glove at night Therapies tried- nothing significantly Severity- at its worse 10     Past Medical History:  Diagnosis Date  . Aortic heart murmur    AS/AI on 2D ECHO ; SBE prophylaxis Rxed  . Chest pain 2007 ; 2011   negative cardiac assessment  . Hyperlipidemia   . Interstitial cystitis    Resolved  . Osteoporosis    T score - 3.0 @ femoral neck; Fosamax affected swallowing  . Postmenopausal    No PMH or HRT   Past Surgical History:  Procedure Laterality Date  . COLONOSCOPY  2011   Dr Sharlett Iles   Social History   Social History  . Marital status: Married    Spouse name: N/A  . Number of children: N/A  . Years of education: N/A   Social History Main Topics  . Smoking status: Never Smoker  . Smokeless tobacco: Never Used  . Alcohol use No  . Drug use: No  . Sexual activity: Not Asked   Other Topics Concern  . None   Social History Narrative  . None   Allergies  Allergen Reactions  . Amitid [Amitriptyline Hcl]     Patient experienced a dizzy, spinning feeling   . Metronidazole     REACTION: throat swells  . Penicillins     REACTION: rash & fever  . Alendronate Sodium     REACTION: unspecified  . Clarithromycin     GI intolerance  . Flonase [Fluticasone Propionate]     Slight Headache   . Montelukast Other  (See Comments)    unknown  . Promethazine Hcl     REACTION: unspecified  . Robitussin (Alcohol Free) [Guaifenesin] Other (See Comments)    unknown hyperactive  . Spironolactone     09/21/13 weakness in legs  . Singulair [Montelukast Sodium]     "spacey"; ineffective   Family History  Problem Relation Age of Onset  . Diabetes Father        Pancreatitic insufficiency post MVA  . Hypertension Mother   . Osteoporosis Mother   . Heart disease Mother        AF  . Thyroid disease Sister        hypothyroidism  . Coronary artery disease Sister        AF  . Stroke Sister 83       also pulmonary disease     Past medical history, social, surgical and family history all reviewed in electronic medical record.  No pertanent information unless stated regarding to the chief complaint.   Review of Systems:Review of systems updated and as accurate as of 09/24/17  No headache, visual changes, nausea, vomiting, diarrhea, constipation, dizziness, abdominal pain, skin rash, fevers, chills, night sweats, weight loss, swollen lymph nodes, body aches, joint swelling, muscle aches, chest pain, shortness of breath,  mood changes.   Objective  Blood pressure (!) 150/70, pulse 72, height 5\' 3"  (1.6 m), weight 119 lb (54 kg), SpO2 94 %. Systems examined below as of 09/24/17   General: No apparent distress alert But not oriented, mood and affect normal, dressed appropriately.  HEENT: Pupils equal, extraocular movements intact  Respiratory: Patient's speak in full sentences and does not appear short of breath  Cardiovascular: No lower extremity edema, non tender, no erythema  Skin: Warm dry intact with no signs of infection or rash on extremities or on axial skeleton.  Abdomen: Soft nontender  Neuro: Cranial nerves II through XII are intact, neurovascularly intact in all extremities with 2+ DTRs and 2+ pulses.  Lymph: No lymphadenopathy of posterior or anterior cervical chain or axillae bilaterally.  Gait  Mild antalgic MSK:  Non tender with full range of motion and good stability and symmetric strength and tone of shoulders, elbows, hip, knee and ankles bilaterally. Severe arthritic changes of multiple joints Wrist: Right Inspection arthritic changes noted ROM smooth and normal with good flexion and extension and ulnar/radial deviation that is symmetrical with opposite wrist. Mild crepitus noted in the wrist bilaterally Palpation is normal over metacarpals, navicular, lunate, and TFCC; tendons without tenderness/ swelling No snuffbox tenderness. No tenderness over Canal of [place]. Strength 5/5 in all directions without pain. Negative Finkelstein, positive tinel's and phalens. Negative Watson's test.  MSK US performed of: Right wrist This study was ordered, performed, and interpreted by Alicia Boyd D.O.  Wrist: Arthritic changes of multiple joints. Tendons all appeared to be intact with no significant inflammation. Patient's median nerve though does have hypoechoic changes within the nerve sheath. Mild enlargement with area of 1.2.  IMPRESSION:  Mild arthritic changes as well as mild carpal tunnel  Procedure: Real-time Ultrasound Guided Injection of right carpal tunnel Device: GE Logiq Q7  Ultrasound guided injection is preferred based studies that show increased duration, increased effect, greater accuracy, decreased procedural pain, increased response rate with ultrasound guided versus blind injection.  Verbal informed consent obtained.  Time-out conducted.  Noted no overlying erythema, induration, or other signs of local infection.  Skin prepped in a sterile fashion.  Local anesthesia: Topical Ethyl chloride.  With sterile technique and under real time ultrasound guidance:  median nerve visualized.  23g 5/8 inch needle inserted distal to proximal approach into nerve sheath. Pictures taken nfor needle placement. Patient did have injection of 2 cc of 1% lidocaine, 1 cc of 0.5% Marcaine, and  1 cc of Kenalog 40 mg/dL. Completed without difficulty  Pain immediately resolved suggesting accurate placement of the medication.  Advised to call if fevers/chills, erythema, induration, drainage, or persistent bleeding.  Images permanently stored and available for review in the ultrasound unit.  Impression: Technically successful ultrasound guided injection.  Procedure note 32671; 15 additional minutes spent for Therapeutic exercises as stated in above notes.  This included exercises focusing on stretching, strengthening, with significant focus on eccentric aspects.   Long term goals include an improvement in range of motion, strength, endurance as well as avoiding reinjury. Patient's frequency would include in 1-2 times a day, 3-5 times a week for a duration of 6-12 weeks. Carpal Tunnel Syndrome: We discussed the anatomy involved, and that carpal tunnel syndrome primarily involves the median nerve, and this typically affects digits one through 3. We also discussed that mild cases of carpal tunnel syndrome are often improved with night splints, T Centrex with wrist extension and abduction given. Proper technique shown and discussed  handout in great detail with ATC.  All questions were discussed and answered.     Impression and Recommendations:     This case required medical decision making of moderate complexity.      Note: This dictation was prepared with Dragon dictation along with smaller phrase technology. Any transcriptional errors that result from this process are unintentional.

## 2017-09-24 ENCOUNTER — Ambulatory Visit (INDEPENDENT_AMBULATORY_CARE_PROVIDER_SITE_OTHER): Payer: MEDICARE | Admitting: Family Medicine

## 2017-09-24 ENCOUNTER — Ambulatory Visit: Payer: Self-pay

## 2017-09-24 ENCOUNTER — Encounter: Payer: Self-pay | Admitting: Family Medicine

## 2017-09-24 VITALS — BP 150/70 | HR 72 | Ht 63.0 in | Wt 119.0 lb

## 2017-09-24 DIAGNOSIS — M25531 Pain in right wrist: Secondary | ICD-10-CM

## 2017-09-24 DIAGNOSIS — G5601 Carpal tunnel syndrome, right upper limb: Secondary | ICD-10-CM | POA: Diagnosis not present

## 2017-09-24 NOTE — Patient Instructions (Addendum)
Good to see you  Ice is yrou friend Ice 20 minutes 2 times daily. Usually after activity and before bed. Wear brace at night  Injected and should get better over the next 2 weeks.  See me again in 2-3 months if you need me.

## 2017-09-24 NOTE — Assessment & Plan Note (Signed)
Patient given injection today and tolerated the procedure well. We discussed icing regimen and home exercises. We discussed bracing at night. Work with Product/process development scientist. Patient and will start increasing activity as tolerated. Follow-up with me again in 6 weeks.

## 2017-12-09 ENCOUNTER — Ambulatory Visit: Payer: MEDICARE | Admitting: Family Medicine

## 2017-12-18 ENCOUNTER — Ambulatory Visit: Payer: Self-pay | Admitting: *Deleted

## 2017-12-18 NOTE — Telephone Encounter (Signed)
Pt  Under  Stress  Recently    Taking  Care  Of    Husband   -  Pt   Not  Eating   Her  Usual     vegatables     Because   Her  Husband  Is  On a  Special   Diet .  Pt  Was    Advised   To  Start  Of  With  High  Fiber  And  Lots    And   Then   Take    Laxatives   .   She    Was  Advised  To  followup    With  In  1 week   If no bm   Or  Go to  Er  If any  Severe  abd  Pain    Reason for Disposition . Treating constipation with Over-The-Counter (OTC) medicines, questions about  Answer Assessment - Initial Assessment Questions 1. STOOL PATTERN OR FREQUENCY: "How often do you pass bowel movements (BMs)?"  (Normal range: tid to q 3 days)  "When was the last BM passed?"      In  Past   Went  To   Bathroom    Every     About   4  Days  Ago  Started  To  Become   Constipated  Had    A  bm  3  Days  Ago    2. STRAINING: "Do you have to strain to have a BM?"      Yes   3. RECTAL PAIN: "Does your rectum hurt when the stool comes out?" If so, ask: "Do you have hemorrhoids? How bad is the pain?"  (Scale 1-10; or mild, moderate, severe)     No 4. STOOL COMPOSITION: "Are the stools hard?"        3  Days   Ago    1  St  Stool Was  Hard  Followed  By  Loose    5. BLOOD ON STOOLS: "Has there been any blood on the toilet tissue or on the surface of the BM?" If so, ask: "When was the last time?"      No    6. CHRONIC CONSTIPATION: "Is this a new problem for you?"  If no, ask: How long have you had this problem?" (days, weeks, months)        Has    Hard  intermittant  Problems  In the  Past    7. CHANGES IN DIET: "Have there been any recent changes in your diet?"         No  Getting  Enough   Fiber  In  Her  Diet   8. MEDICATIONS: "Have you been taking any new medications?"     no 9. LAXATIVES: "Have you been using any laxatives or enemas?"  If yes, ask "What, how often, and when was the last time?"     no 10. CAUSE: "What do you think is causing the constipation?"       Stress    Recent   Dietary   Husband      11. OTHER SYMPTOMS: "Do you have any other symptoms?" (e.g., abdominal pain, fever, vomiting)        no 12. PREGNANCY: "Is there any chance you are pregnant?" "When was your last menstrual period?"       n/a  Protocols used: CONSTIPATION-A-AH

## 2018-01-04 NOTE — Progress Notes (Signed)
[name] [address], [place] [address] Phone: [phone] Subjective:    I'm seeing this patient by the request  of:    CC: Right wrist pain  OZH:Alicia Boyd  Alicia Boyd is a 84 y.o. female coming in with complaint of right wrist pain.  Patient was seen before and did have more of a carpal tunnel.  Was given an again.  Patient states that she has been very bad and doing the exercises at this moment.  States that it is difficult because she is taking care of her husband.  Rates the severity of pain is 7 out of 10.  Seems that the numbness seems to be more constant.      Past Medical History:  Diagnosis Date  . Aortic heart murmur    AS/AI on 2D ECHO ; SBE prophylaxis Rxed  . Chest pain 2007 ; 2011   negative cardiac assessment  . Hyperlipidemia   . Interstitial cystitis    Resolved  . Osteoporosis    T score - 3.0 @ femoral neck; Fosamax affected swallowing  . Postmenopausal    No PMH or HRT   Past Surgical History:  Procedure Laterality Date  . COLONOSCOPY  2011   Dr Sharlett Iles   Social History   Socioeconomic History  . Marital status: Married    Spouse name: None  . Number of children: None  . Years of education: None  . Highest education level: None  Social Needs  . Financial resource strain: None  . Food insecurity - worry: None  . Food insecurity - inability: None  . Transportation needs - medical: None  . Transportation needs - non-medical: None  Occupational History  . None  Tobacco Use  . Smoking status: Never Smoker  . Smokeless tobacco: Never Used  Substance and Sexual Activity  . Alcohol use: No  . Drug use: No  . Sexual activity: None  Other Topics Concern  . None  Social History Narrative  . None   Allergies  Allergen Reactions  . Amitid [Amitriptyline Hcl]     Patient experienced a dizzy, spinning feeling   . Metronidazole     REACTION: throat swells  . Penicillins     REACTION: rash &  fever  . Alendronate Sodium     REACTION: unspecified  . Clarithromycin     GI intolerance  . Flonase [Fluticasone Propionate]     Slight Headache   . Montelukast Other (See Comments)    unknown  . Promethazine Hcl     REACTION: unspecified  . Robitussin (Alcohol Free) [Guaifenesin] Other (See Comments)    unknown hyperactive  . Spironolactone     09/21/13 weakness in legs  . Singulair [Montelukast Sodium]     "spacey"; ineffective   Family History  Problem Relation Age of Onset  . Diabetes Father        Pancreatitic insufficiency post MVA  . Hypertension Mother   . Osteoporosis Mother   . Heart disease Mother        AF  . Thyroid disease Sister        hypothyroidism  . Coronary artery disease Sister        AF  . Stroke Sister 67       also pulmonary disease     Past medical history, social, surgical and family history all reviewed in electronic medical record.  No pertanent information unless stated regarding to the chief complaint.   Review  of Systems:Review of systems updated and as accurate as of 01/05/18  No headache, visual changes, nausea, vomiting, diarrhea, constipation, dizziness, abdominal pain, skin rash, fevers, chills, night sweats, weight loss, swollen lymph nodes, body aches, joint swelling, muscle aches, chest pain, shortness of breath, mood changes.   Objective  Blood pressure (!) 168/90, pulse 78, height 5\' 5"  (1.651 m), weight 119 lb (54 kg), SpO2 98 %. Systems examined below as of 01/05/18   General: No apparent distress alert and oriented x3 mood and affect normal, dressed appropriately.  HEENT: Pupils equal, extraocular movements intact  Respiratory: Patient's speak in full sentences and does not appear short of breath  Cardiovascular: No lower extremity edema, non tender, no erythema  Skin: Warm dry intact with no signs of infection or rash on extremities or on axial skeleton.  Abdomen: Soft nontender  Neuro: Cranial nerves II through XII are  intact, neurovascularly intact in all extremities with 2+ DTRs and 2+ pulses.  Lymph: No lymphadenopathy of posterior or anterior cervical chain or axillae bilaterally.  Gait mild antalgic MSK:  Non tender with full range of motion and good stability and symmetric strength and tone of shoulders, elbows, hip, knee and ankles bilaterally.  Moderate to severe arthritic changes of multiple joints  Right wrist shows the patient does have significant arthritic changes of multiple joints.  Patient does have a positive Tinel's.  4 out of 5 grip strength compared to the contralateral side.    Impression and Recommendations:     This case required medical decision making of moderate complexity.      Note: This dictation was prepared with Dragon dictation along with smaller phrase technology. Any transcriptional errors that result from this process are unintentional.

## 2018-01-05 ENCOUNTER — Ambulatory Visit (INDEPENDENT_AMBULATORY_CARE_PROVIDER_SITE_OTHER): Payer: MEDICARE | Admitting: Family Medicine

## 2018-01-05 ENCOUNTER — Encounter: Payer: Self-pay | Admitting: Family Medicine

## 2018-01-05 VITALS — BP 168/90 | HR 78 | Ht 65.0 in | Wt 119.0 lb

## 2018-01-05 DIAGNOSIS — M79642 Pain in left hand: Secondary | ICD-10-CM

## 2018-01-05 DIAGNOSIS — G5601 Carpal tunnel syndrome, right upper limb: Secondary | ICD-10-CM

## 2018-01-05 MED ORDER — GABAPENTIN 100 MG PO CAPS: 100.0000 mg | ORAL_CAPSULE | Freq: Every day | ORAL | 3 refills | Status: DC

## 2018-01-05 NOTE — Assessment & Plan Note (Signed)
Patient only had 2 months of improvement with the injection.  Declined a repeat injection at this time.  We discussed gabapentin.  Home exercises, bracing at night.  Patient will be sent to formal physical therapy.  Patient will come back and see me again in 4 weeks

## 2018-01-05 NOTE — Patient Instructions (Signed)
Good to see you  Alicia Boyd is your friend.  Wear the brace at night  Gabapentin 100mg  at night  PT will be calling you  See me again in 4 weeks

## 2018-02-04 ENCOUNTER — Ambulatory Visit: Payer: MEDICARE | Attending: Family Medicine

## 2018-02-04 ENCOUNTER — Other Ambulatory Visit: Payer: Self-pay

## 2018-02-04 DIAGNOSIS — M25531 Pain in right wrist: Secondary | ICD-10-CM | POA: Diagnosis not present

## 2018-02-04 DIAGNOSIS — M6281 Muscle weakness (generalized): Secondary | ICD-10-CM | POA: Insufficient documentation

## 2018-02-04 DIAGNOSIS — R2689 Other abnormalities of gait and mobility: Secondary | ICD-10-CM | POA: Diagnosis not present

## 2018-02-04 DIAGNOSIS — M79641 Pain in right hand: Secondary | ICD-10-CM | POA: Diagnosis not present

## 2018-02-04 NOTE — Therapy (Signed)
[facility] [address], [place], [place], [address] Phone: [phone]   Fax:  [phone]  Physical Therapy Evaluation  Patient Details  Name: Alicia Boyd MRN: [id number] Date of Birth: 1930-11-17 Referring Provider: Joanne Chars, MD   Encounter Date: 02/04/2018  Alicia Boyd End of Session - 02/04/18 1518    Visit Number  1    Date for Alicia Boyd Re-Evaluation  04/01/18    Authorization Type  Medicare    Alicia Boyd Start Time  1436    Alicia Boyd Stop Time  1517    Alicia Boyd Time Calculation (min)  41 min    Activity Tolerance  Patient tolerated treatment well    Behavior During Therapy  [facility] for tasks assessed/performed       Past Medical History:  Diagnosis Date  . Aortic heart murmur    AS/AI on 2D ECHO ; SBE prophylaxis Rxed  . Chest pain 2007 ; 2011   negative cardiac assessment  . Hyperlipidemia   . Interstitial cystitis    Resolved  . Osteoporosis    T score - 3.0 @ femoral neck; Fosamax affected swallowing  . Postmenopausal    No PMH or HRT    Past Surgical History:  Procedure Laterality Date  . COLONOSCOPY  2011   Dr Sharlett Iles    There were no vitals filed for this visit.   Subjective Assessment - 02/04/18 1438    Subjective  Alicia Boyd is an 84 y.o. Rt hand dominant female who presents to Alicia Boyd with Rt hand pain and numbness.  Alicia Boyd reports tingling/numbness in digits 1-3 of the Rt hand.  Alicia Boyd had carpal tunnel injection 09/24/17 and this didn't help much.  Alicia Boyd takes care of her husband at home and works with her hands.  Alicia Boyd is doing the exercises given by MD and is wearing a wrist brace.      Diagnostic tests  diagnostic ultrasound: inflammation in carpal tunnel    Patient Stated Goals  improve use of Rt UE, reduce pain and tingling    Currently in Pain?  Yes    Pain Score  5     Pain Location  Hand    Pain Orientation  Right    Pain Descriptors / Indicators  Burning;Aching;Tingling    Pain Type  Chronic pain    Pain Onset  More than a  month ago    Pain Frequency  Constant    Aggravating Factors   use of Rt hand, when it is cold    Pain Relieving Factors  resting it against my side, sleep in brace         [facility] Alicia Boyd Assessment - 02/04/18 0001      Assessment   Medical Diagnosis  pain of Rt hand    Referring Provider  Joanne Chars, MD    Onset Date/Surgical Date  01/05/16    Hand Dominance  Right    Prior Therapy  none.  Cortizone injection 09/24/17      Precautions   Precautions  None      Restrictions   Weight Bearing Restrictions  No      Balance Screen   Has the patient fallen in the past 6 months  No    Has the patient had a decrease in activity level because of a fear of falling?   No    Is the patient reluctant to leave their home because of a fear of falling?   No      Home  [employer]video [employer] residence    Living Arrangements  Spouse/significant other    Type of [place] to enter    Home Layout  Two level      Prior Function   Level of Independence  Independent    Vocation  Other (comment)    Vocation Requirements  Alicia Boyd cares for her husband at home      Cognition   Overall Cognitive Status  Within Functional Limits for tasks assessed      Posture/Postural Control   Posture/Postural Control  Postural limitations    Postural Limitations  Forward head;Rounded Shoulders;Flexed trunk      ROM / Strength   AROM / PROM / Strength  AROM;PROM;Strength      AROM   Overall AROM   Deficits    Overall AROM Comments  Rt and Lt wrist extension limited by 25% and Rt=Lt.  FLexion is full, Radial deviation limited by 50% bilaterally.  Rt grip is limited due to limited flexion of 3rd digit on the Rt.  Elbow A/ROM is full bilateraly. No pain with any A/ROM testing.        PROM   Overall PROM   Deficits    Overall PROM Comments  see above      Strength   Overall Strength  Deficits    Strength Assessment Site  Wrist;Hand    Right/Left Wrist  Right;Left     Right Wrist Flexion  4/5    Right Wrist Extension  4/5    Right Wrist Radial Deviation  4/5    Left Wrist Flexion  4+/5    Left Wrist Extension  4+/5    Left Wrist Radial Deviation  4+/5    Right/Left hand  Right;Left    Right Hand Grip (lbs)  24#    Left Hand Grip (lbs)  26#      Palpation   Palpation comment  no palpable tenderness.  Reduced metatarsal mobility between digits 2 &3 and 3& 4.               Objective measurements completed on examination: See above findings.              Alicia Boyd Education - 02/04/18 1512    Education provided  Yes    Education Details  wrist A/ROM exercise        Alicia Boyd Short Term Goals - 02/04/18 1447      Alicia Boyd SHORT TERM GOAL #1   Title  be independent in initial HEP    Time  4    Period  Weeks    Status  New    Target Date  03/04/18      Alicia Boyd SHORT TERM GOAL #2   Title  report a 25% reduction in rt hand and wrist pain/radiculopathy with daily activity    Baseline  --    Time  4    Period  Weeks    Status  New    Target Date  03/04/18        Alicia Boyd Long Term Goals - 02/04/18 1525      Alicia Boyd LONG TERM GOAL #1   Title  be independent in advanced HEP    Time  8    Period  Weeks    Status  New    Target Date  04/01/18      Alicia Boyd LONG TERM GOAL #2   Title  report a 50%  reduction in Rt hand and wrist pain/radiculopathy with daily activity    Baseline  --    Time  8    Period  Weeks    Status  New    Target Date  04/01/18      Alicia Boyd LONG TERM GOAL #3   Title  demonstrate 4+/5 Rt wrist strength to improve endurance for use at home    Baseline  --    Time  8    Period  Days    Status  New    Target Date  04/01/18      Alicia Boyd LONG TERM GOAL #4   Title  wean from wearing immobilizing brace with ADLs by 50% due to reduced pain    Baseline  --    Time  8    Period  Weeks    Status  New    Target Date  04/01/18             Plan - 02/04/18 1519    Clinical Impression Statement  Alicia Boyd is a Rt hand dominant female who presents to  Alicia Boyd with 3 year history of Rt wrist pain with numbness and tingling into the hand.  No incident or injury.  Alicia Boyd received carpal tunnel injection 09/24/17 and reports minimal change in pain.  Alicia Boyd is wearing a wrist immobilizing brace at night.  Alicia Boyd is active in caring for her husband who requires 24/7 care.  Alicia Boyd demonstrates limited finger A/ROM in mass grip due to digit 3 limited a/ROM.  Grip strength on Rt is 24# and Lt is 26#.  Rt wrist strrength is 4/5 vs 4+/5 on the Lt.  No palpable tenderness on the Rt elbow, wrist, hand or forearm.  Alicia Boyd will benfit from skilled Alicia Boyd to reduce Rt hand radiculopathy, improve strength and functional use of the Rt UE to improve care of her husband.      History and Personal Factors relevant to plan of care:  patient is primary caregiver for her husband    Clinical Presentation  Stable    Clinical Presentation due to:  3 year history of pain with little change    Clinical Decision Making  Low    Rehab Potential  Good    Alicia Boyd Frequency  2x / week    Alicia Boyd Duration  8 weeks    Alicia Boyd Treatment/Interventions  ADLs/Self Care Home Management;Cryotherapy;Electrical Stimulation;Ultrasound;Iontophoresis 4mg /ml Dexamethasone;Functional mobility training;Therapeutic activities;Therapeutic exercise;Patient/family education;Manual techniques;Scar mobilization;Passive range of motion;Taping;Dry needling    Alicia Boyd Next Visit Plan  review HEP, Rt grip strength, Rt wrist strength, [place] to carpal tunnel, kinesiotape    Consulted and Agree with Plan of Care  Patient       Patient will benefit from skilled therapeutic intervention in order to improve the following deficits and impairments:  Pain, Postural dysfunction, Decreased activity tolerance, Decreased range of motion, Impaired UE functional use, Decreased strength, Impaired flexibility  Visit Diagnosis: Pain in right wrist - Plan: Alicia Boyd plan of care cert/re-cert  Pain in right hand - Plan: Alicia Boyd plan of care cert/re-cert  Muscle weakness (generalized) -  Plan: Alicia Boyd plan of care cert/re-cert     Problem List Patient Active Problem List   Diagnosis Date Noted  . Mild carpal tunnel syndrome of right wrist 09/24/2017  . Ecchymoses, spontaneous 07/29/2016  . Varicose veins 07/29/2016  . Nasal congestion 07/29/2016  . Hyperglycemia 07/12/2015  . Paresthesia of hand 03/22/2014  . Undiagnosed cardiac murmurs 03/22/2014  . Palpitations 03/03/2012  . ANEMIA, MILD  07/27/2010  . VENOUS INSUFFICIENCY, MILD 07/27/2010  . OTHER CONSTIPATION 04/17/2010  . Essential hypertension 04/06/2010  . RHINOCONJUNCTIVITIS, ALLERGIC 04/04/2010  . PERSONAL HISTORY OTHER DISORDER URINARY SYSTEM 01/25/2010  . OTHER DYSPHAGIA 04/11/2009  . ESOPHAGEAL REFLUX 01/24/2009  . OTHER AND UNSPECIFIED HYPERLIPIDEMIA 11/23/2008  . Osteoporosis 11/23/2008  . NONSPECIFIC ABNORMAL ELECTROCARDIOGRAM 11/23/2008     Alicia Boyd, Alicia Boyd 02/04/18 3:29 PM  [facility] [address], [place], [place], [address] Phone: [phone]   Fax:  [phone]  Name: Alicia Boyd MRN: [id number] Date of Birth: [birth year]

## 2018-02-04 NOTE — Patient Instructions (Signed)
AROM: Elbow Flexion / Extension   With left hand palm up, gently bend elbow as far as possible. Then straighten arm as far as possible. Repeat _10___ times per set. Do ____ sets per session. Do __3__ sessions per day.  Copyright  VHI. All rights reserved.  Wrist Flexor Stretch   Keeping elbow straight, grasp left hand and slowly bend wrist back until stretch is felt. Hold __10-20__ seconds. Relax. Repeat _3___ times per set. Do ____ sets per session. Do __5_ sessions per day.  Copyright  VHI. All rights reserved.  Wrist Extensor Stretch   Keeping elbow straight, grasp left hand and slowly bend wrist forward until stretch is felt. Hold __10-20__ seconds. Relax. Repeat __3__ times per set. Do ____ sets per session. Do __5__ sessions per day.  Copyright  VHI. All rights reserved.  Composite Extension (Passive Flexor Stretch)    Sitting with elbows on table and palms together, slowly lower wrists toward table until stretch is felt. Be sure to keep palms together throughout stretch. Hold __10__ seconds. Relax. Repeat __3__ times. Do __5__ sessions per day.  Copyright  VHI. All rights reserved.  Grip Strengthening (Isometric)    Squeeze washcloth roll. Hold _3-5___ seconds. Relax _5___ seconds. Repeat __10__ times. Do __many__ sessions per day.  Copyright  VHI. All rights reserved.  [facility] [address], [place], [place] [address] Phone # [phone] Fax [phone]

## 2018-02-09 ENCOUNTER — Encounter: Payer: Self-pay | Admitting: Physical Therapy

## 2018-02-09 ENCOUNTER — Ambulatory Visit: Payer: MEDICARE | Admitting: Physical Therapy

## 2018-02-09 DIAGNOSIS — M6281 Muscle weakness (generalized): Secondary | ICD-10-CM

## 2018-02-09 DIAGNOSIS — R2689 Other abnormalities of gait and mobility: Secondary | ICD-10-CM | POA: Diagnosis not present

## 2018-02-09 DIAGNOSIS — M79641 Pain in right hand: Secondary | ICD-10-CM

## 2018-02-09 DIAGNOSIS — M25531 Pain in right wrist: Secondary | ICD-10-CM

## 2018-02-09 NOTE — Therapy (Signed)
[facility] [address] [place], [place], [address] Phone: [phone]   Fax:  [phone]  Physical Therapy Treatment  Patient Details  Name: Alicia Boyd MRN: [id number] Date of Birth: [birth year] Referring Provider: Joanne Chars, MD   Encounter Date: 02/09/2018  PT End of Session - 02/09/18 1453    Visit Number  2    Date for PT Re-Evaluation  04/01/18    Authorization Type  Medicare    PT Start Time  1447    PT Stop Time  1525    PT Time Calculation (min)  38 min    Activity Tolerance  Patient tolerated treatment well    Behavior During Therapy  [facility] for tasks assessed/performed       Past Medical History:  Diagnosis Date  . Aortic heart murmur    AS/AI on 2D ECHO ; SBE prophylaxis Rxed  . Chest pain 2007 ; 2011   negative cardiac assessment  . Hyperlipidemia   . Interstitial cystitis    Resolved  . Osteoporosis    T score - 3.0 @ femoral neck; Fosamax affected swallowing  . Postmenopausal    No PMH or HRT    Past Surgical History:  Procedure Laterality Date  . COLONOSCOPY  2011   Dr Sharlett Iles    There were no vitals filed for this visit.  Subjective Assessment - 02/09/18 1454    Subjective  I am doing my exercises. I have numbess in my fingers today. Exercises do not increase symptoms.     Currently in Pain?  No/denies Rt middle 3 fingers are numb    Pain Orientation  Right    Pain Descriptors / Indicators  Numbness    Aggravating Factors   constant    Pain Relieving Factors  brace maybe, but not much    Multiple Pain Sites  No                      [facility] Adult PT Treatment/Exercise - 02/09/18 0001      Self-Care   Self-Care  Other Self-Care Comments    Other Self-Care Comments   How to use her soft ball and use it at home to her hand, wrist and forearm simulating the [place] techniques today to reduce her symptoms      Ultrasound   Ultrasound Location  RT  anterior wrist/hand    Ultrasound Parameters  3MZ 50% 1wtcm2 x 8 min    Ultrasound Goals  Pain      Manual Therapy   Manual therapy comments  Instrument Asstisted with [place] feathering technique and  upgrading to thenar eminence and anterior forearm               PT Short Term Goals - 02/09/18 1518      PT SHORT TERM GOAL #1   Title  be independent in initial HEP    Time  4    Period  Weeks    Status  Achieved        PT Long Term Goals - 02/04/18 1525      PT LONG TERM GOAL #1   Title  be independent in advanced HEP    Time  8    Period  Weeks    Status  New    Target Date  04/01/18      PT LONG TERM GOAL #2   Title  report a 50% reduction in Rt hand and  wrist pain/radiculopathy with daily activity    Baseline  --    Time  8    Period  Weeks    Status  New    Target Date  04/01/18      PT LONG TERM GOAL #3   Title  demonstrate 4+/5 Rt wrist strength to improve endurance for use at home    Baseline  --    Time  8    Period  Days    Status  New    Target Date  04/01/18      PT LONG TERM GOAL #4   Title  wean from wearing immobilizing brace with ADLs by 50% due to reduced pain    Baseline  --    Time  8    Period  Weeks    Status  New    Target Date  04/01/18            Plan - 02/09/18 1453    Clinical Impression Statement  Pt presents today with numbness throughout her middle three fingers that extends to the tips. After manual techniques to the hand, wrist and forearm in addition to ultrasound to the Rt carpal tunnel pt could make an almost closed fist and her numbness was reportedly only from PIP to DIP. She verbally reported "it feels much better."  Pt is compliant with her HEP, meeting this short term goal.     Rehab Potential  Good    PT Frequency  2x / week    PT Duration  8 weeks    PT Treatment/Interventions  ADLs/Self Care Home Management;Cryotherapy;Electrical Stimulation;Ultrasound;Iontophoresis 4mg /ml Dexamethasone;Functional  mobility training;Therapeutic activities;Therapeutic exercise;Patient/family education;Manual techniques;Scar mobilization;Passive range of motion;Taping;Dry needling    PT Next Visit Plan  Manual, review ball exercises if she brings it, [place] and gripping exercises.     Consulted and Agree with Plan of Care  --       Patient will benefit from skilled therapeutic intervention in order to improve the following deficits and impairments:  Pain, Postural dysfunction, Decreased activity tolerance, Decreased range of motion, Impaired UE functional use, Decreased strength, Impaired flexibility  Visit Diagnosis: Pain in right wrist  Pain in right hand  Muscle weakness (generalized)  Other abnormalities of gait and mobility     Problem List Patient Active Problem List   Diagnosis Date Noted  . Mild carpal tunnel syndrome of right wrist 09/24/2017  . Ecchymoses, spontaneous 07/29/2016  . Varicose veins 07/29/2016  . Nasal congestion 07/29/2016  . Hyperglycemia 07/12/2015  . Paresthesia of hand 03/22/2014  . Undiagnosed cardiac murmurs 03/22/2014  . Palpitations 03/03/2012  . ANEMIA, MILD 07/27/2010  . VENOUS INSUFFICIENCY, MILD 07/27/2010  . OTHER CONSTIPATION 04/17/2010  . Essential hypertension 04/06/2010  . RHINOCONJUNCTIVITIS, ALLERGIC 04/04/2010  . PERSONAL HISTORY OTHER DISORDER URINARY SYSTEM 01/25/2010  . OTHER DYSPHAGIA 04/11/2009  . ESOPHAGEAL REFLUX 01/24/2009  . OTHER AND UNSPECIFIED HYPERLIPIDEMIA 11/23/2008  . Osteoporosis 11/23/2008  . NONSPECIFIC ABNORMAL ELECTROCARDIOGRAM 11/23/2008    Jeovanni Heuring, PTA 02/09/2018, 3:29 PM  [facility] [address] [place], [place], [address] Phone: [phone]   Fax:  [phone]  Name: Alicia Boyd MRN: [id number] Date of Birth: 07/21/1930

## 2018-02-16 ENCOUNTER — Ambulatory Visit: Payer: MEDICARE | Admitting: Physical Therapy

## 2018-02-16 DIAGNOSIS — R2689 Other abnormalities of gait and mobility: Secondary | ICD-10-CM | POA: Diagnosis not present

## 2018-02-16 DIAGNOSIS — M6281 Muscle weakness (generalized): Secondary | ICD-10-CM | POA: Diagnosis not present

## 2018-02-16 DIAGNOSIS — M79641 Pain in right hand: Secondary | ICD-10-CM | POA: Diagnosis not present

## 2018-02-16 DIAGNOSIS — M25531 Pain in right wrist: Secondary | ICD-10-CM

## 2018-02-16 NOTE — Therapy (Signed)
[facility] [address], [place], [place], [address] Phone: [phone]   Fax:  [phone]  Physical Therapy Treatment  Patient Details  Name: Alicia Boyd MRN: [id number] Date of Birth: 1930/07/31 Referring Provider: Joanne Chars, MD   Encounter Date: 02/16/2018  PT End of Session - 02/16/18 1448    Visit Number  3    Date for PT Re-Evaluation  04/01/18    Authorization Type  Medicare    PT Start Time  1447    PT Stop Time  1520    PT Time Calculation (min)  33 min    Activity Tolerance  Patient tolerated treatment well    Behavior During Therapy  [facility] for tasks assessed/performed       Past Medical History:  Diagnosis Date  . Aortic heart murmur    AS/AI on 2D ECHO ; SBE prophylaxis Rxed  . Chest pain 2007 ; 2011   negative cardiac assessment  . Hyperlipidemia   . Interstitial cystitis    Resolved  . Osteoporosis    T score - 3.0 @ femoral neck; Fosamax affected swallowing  . Postmenopausal    No PMH or HRT    Past Surgical History:  Procedure Laterality Date  . COLONOSCOPY  2011   Dr Sharlett Iles    There were no vitals filed for this visit.  Subjective Assessment - 02/16/18 1448    Subjective  I have hard a hard time with my husband this past week, so I have not done my exercises as much.     Diagnostic tests  diagnostic ultrasound: inflammation in carpal tunnel    Patient Stated Goals  improve use of Rt UE, reduce pain and tingling    Currently in Pain?  -- No pain, just numbess    Pain Location  -- Back of hand, thumb, middle finger    Pain Orientation  Right    Pain Descriptors / Indicators  Numbness    Aggravating Factors   constant    Pain Relieving Factors  not sure    Multiple Pain Sites  No                      [facility] Adult PT Treatment/Exercise - 02/16/18 0001      Wrist Exercises   Other wrist exercises  rolling wrist and palm with soft massage item x 3 -5  min, followed by gripping the same item 2x 10      Ultrasound   Ultrasound Location  RT anterior wrist    Ultrasound Parameters  3 MZ 50% 1wtcm2    Ultrasound Goals  Pain      Manual Therapy   Manual therapy comments  Instrument Asstisted with [place] feathering technique and  upgrading to thenar eminence and anterior forearm               PT Short Term Goals - 02/09/18 1518      PT SHORT TERM GOAL #1   Title  be independent in initial HEP    Time  4    Period  Weeks    Status  Achieved        PT Long Term Goals - 02/04/18 1525      PT LONG TERM GOAL #1   Title  be independent in advanced HEP    Time  8    Period  Weeks    Status  New    Target Date  04/01/18      PT LONG TERM GOAL #2   Title  report a 50% reduction in Rt hand and wrist pain/radiculopathy with daily activity    Baseline  --    Time  8    Period  Weeks    Status  New    Target Date  04/01/18      PT LONG TERM GOAL #3   Title  demonstrate 4+/5 Rt wrist strength to improve endurance for use at home    Baseline  --    Time  8    Period  Days    Status  New    Target Date  04/01/18      PT LONG TERM GOAL #4   Title  wean from wearing immobilizing brace with ADLs by 50% due to reduced pain    Baseline  --    Time  8    Period  Weeks    Status  New    Target Date  04/01/18            Plan - 02/16/18 1448    Clinical Impression Statement  Pt reports that she has had a hard time finding time to do her exercises as her husband has experienced a difficult week. She reports not having much pain but the numbness remains.  After todays treatment she reported the numbness in her fingers was considerably less.     Rehab Potential  Good    PT Frequency  2x / week    PT Duration  8 weeks    PT Treatment/Interventions  ADLs/Self Care Home Management;Cryotherapy;Electrical Stimulation;Ultrasound;Iontophoresis 4mg /ml Dexamethasone;Functional mobility training;Therapeutic  activities;Therapeutic exercise;Patient/family education;Manual techniques;Scar mobilization;Passive range of motion;Taping;Dry needling    PT Next Visit Plan  Manual, review ball exercises if she brings it, [place] and gripping exercises.     Consulted and Agree with Plan of Care  Patient       Patient will benefit from skilled therapeutic intervention in order to improve the following deficits and impairments:  Pain, Postural dysfunction, Decreased activity tolerance, Decreased range of motion, Impaired UE functional use, Decreased strength, Impaired flexibility  Visit Diagnosis: Pain in right wrist  Pain in right hand  Muscle weakness (generalized)  Other abnormalities of gait and mobility     Problem List Patient Active Problem List   Diagnosis Date Noted  . Mild carpal tunnel syndrome of right wrist 09/24/2017  . Ecchymoses, spontaneous 07/29/2016  . Varicose veins 07/29/2016  . Nasal congestion 07/29/2016  . Hyperglycemia 07/12/2015  . Paresthesia of hand 03/22/2014  . Undiagnosed cardiac murmurs 03/22/2014  . Palpitations 03/03/2012  . ANEMIA, MILD 07/27/2010  . VENOUS INSUFFICIENCY, MILD 07/27/2010  . OTHER CONSTIPATION 04/17/2010  . Essential hypertension 04/06/2010  . RHINOCONJUNCTIVITIS, ALLERGIC 04/04/2010  . PERSONAL HISTORY OTHER DISORDER URINARY SYSTEM 01/25/2010  . OTHER DYSPHAGIA 04/11/2009  . ESOPHAGEAL REFLUX 01/24/2009  . OTHER AND UNSPECIFIED HYPERLIPIDEMIA 11/23/2008  . Osteoporosis 11/23/2008  . NONSPECIFIC ABNORMAL ELECTROCARDIOGRAM 11/23/2008    Alicia Boyd, PTA 02/16/2018, 3:21 PM  [facility] [address], [place], [place], [address] Phone: [phone]   Fax:  [phone]  Name: Alicia Boyd MRN: [id number] Date of Birth: [birth year]

## 2018-02-23 ENCOUNTER — Ambulatory Visit: Payer: MEDICARE

## 2018-02-23 DIAGNOSIS — M79641 Pain in right hand: Secondary | ICD-10-CM | POA: Diagnosis not present

## 2018-02-23 DIAGNOSIS — M6281 Muscle weakness (generalized): Secondary | ICD-10-CM

## 2018-02-23 DIAGNOSIS — M25531 Pain in right wrist: Secondary | ICD-10-CM

## 2018-02-23 DIAGNOSIS — R2689 Other abnormalities of gait and mobility: Secondary | ICD-10-CM | POA: Diagnosis not present

## 2018-02-23 NOTE — Therapy (Addendum)
[facility] [address], [place], [place], [address] Phone: [phone]   Fax:  [phone]  Physical Therapy Treatment  Patient Details  Name: Alicia Boyd MRN: [id number] Date of Birth: [birth year] Referring Provider: Joanne Chars, MD   Encounter Date: 02/23/2018  PT End of Session - 02/23/18 1527    Visit Number  4    Date for PT Re-Evaluation  04/01/18    Authorization Type  Medicare    PT Start Time  1446    PT Stop Time  1526    PT Time Calculation (min)  40 min    Activity Tolerance  Patient tolerated treatment well    Behavior During Therapy  [facility] for tasks assessed/performed       Past Medical History:  Diagnosis Date  . Aortic heart murmur    AS/AI on 2D ECHO ; SBE prophylaxis Rxed  . Chest pain 2007 ; 2011   negative cardiac assessment  . Hyperlipidemia   . Interstitial cystitis    Resolved  . Osteoporosis    T score - 3.0 @ femoral neck; Fosamax affected swallowing  . Postmenopausal    No PMH or HRT    Past Surgical History:  Procedure Laterality Date  . COLONOSCOPY  2011   Dr Sharlett Iles    There were no vitals filed for this visit.  Subjective Assessment - 02/23/18 1451    Subjective  I have tingling in my fingers still and pain is better.  60% better overall.      Currently in Pain?  No/denies                      [facility] Adult PT Treatment/Exercise - 02/23/18 0001      Exercises   Exercises  Hand      Hand Exercises   Digiticizer  red mass grip x 3 minutes      Wrist Exercises   Wrist Flexion  Strengthening;Right;20 reps;Seated;Bar weights/barbell    Bar Weights/Barbell (Wrist Flexion)  1 lb    Wrist Extension  Strengthening;Both;20 reps;Bar weights/barbell    Bar Weights/Barbell (Wrist Extension)  1 lb    Wrist Radial Deviation  Strengthening;Both;20 reps;Bar weights/barbell    Bar Weights/Barbell (Radial Deviation)  1 lb      Ultrasound   Ultrasound  Location  Rt anterior wrist    Ultrasound Parameters  3Mz, 50% 0.9w/cm2 x 8 minutes    Ultrasound Goals  Pain      Manual Therapy   Manual Therapy  Myofascial release    Manual therapy comments  soft tissue elongation to Rt wrist flexor tendons and palm with stretching                PT Short Term Goals - 02/23/18 1452      PT SHORT TERM GOAL #1   Title  be independent in initial HEP    Status  Achieved      PT SHORT TERM GOAL #2   Title  report a 25% reduction in rt hand and wrist pain/radiculopathy with daily activity    Baseline  60% better    Status  Achieved      PT SHORT TERM GOAL #3   Title  --        PT Long Term Goals - 02/23/18 1452      PT LONG TERM GOAL #4   Title  wean from wearing immobilizing brace with ADLs by 50% due to  reduced pain    Baseline  still wearing for all ADLs due to inability to rest due to care of husband    Time  8    Period  Weeks    Status  On-going            Plan - 02/23/18 1504    Clinical Impression Statement  Pt reports 60% overall improvement since the start of care.  No pain in the Rt hand or wrist now, and tingling in the first 3 digits remains.  Pt is not able to rest her arm due to care of her husband so she continues to wear her immobilizing brace.  Pt was able to tolerate wrist strength exercises today.  Pt will continue to benefit from skilled PT for Rt hand and wrist strength, manual and ultrasound.      Rehab Potential  Good    PT Frequency  2x / week    PT Duration  8 weeks    PT Treatment/Interventions  ADLs/Self Care Home Management;Cryotherapy;Electrical Stimulation;Ultrasound;Iontophoresis 61m/ml Dexamethasone;Functional mobility training;Therapeutic activities;Therapeutic exercise;Patient/family education;Manual techniques;Scar mobilization;Passive range of motion;Taping;Dry needling    PT Next Visit Plan  Manual, review ball exercises if she brings it, [place] gripping exercises.     Recommended Other  Services  initial certification is signed    Consulted and Agree with Plan of Care  Patient       Patient will benefit from skilled therapeutic intervention in order to improve the following deficits and impairments:  Pain, Postural dysfunction, Decreased activity tolerance, Decreased range of motion, Impaired UE functional use, Decreased strength, Impaired flexibility  Visit Diagnosis: Pain in right wrist  Pain in right hand  Muscle weakness (generalized)     Problem List Patient Active Problem List   Diagnosis Date Noted  . Mild carpal tunnel syndrome of right wrist 09/24/2017  . Ecchymoses, spontaneous 07/29/2016  . Varicose veins 07/29/2016  . Nasal congestion 07/29/2016  . Hyperglycemia 07/12/2015  . Paresthesia of hand 03/22/2014  . Undiagnosed cardiac murmurs 03/22/2014  . Palpitations 03/03/2012  . ANEMIA, MILD 07/27/2010  . VENOUS INSUFFICIENCY, MILD 07/27/2010  . OTHER CONSTIPATION 04/17/2010  . Essential hypertension 04/06/2010  . RHINOCONJUNCTIVITIS, ALLERGIC 04/04/2010  . PERSONAL HISTORY OTHER DISORDER URINARY SYSTEM 01/25/2010  . OTHER DYSPHAGIA 04/11/2009  . ESOPHAGEAL REFLUX 01/24/2009  . OTHER AND UNSPECIFIED HYPERLIPIDEMIA 11/23/2008  . Osteoporosis 11/23/2008  . NONSPECIFIC ABNORMAL ELECTROCARDIOGRAM 11/23/2008    KSigurd Sos PT 02/23/18 3:31 PM PHYSICAL THERAPY DISCHARGE SUMMARY  Visits from Start of Care: 4  Current functional level related to goals / functional outcomes: See above for current status.  Pt didn't return to PT.     Remaining deficits: See above for most current status.     Education / Equipment: HEP Plan: Patient agrees to discharge.  Patient goals were not met. Patient is being discharged due to not returning since the last visit.  ?????        KSigurd Sos PT 05/04/18 8:13 AM  [facility] [address] [place] [address]Phone:  [phone]  Fax:  [phone] Name: Alicia WULFMRN: [id number] of Birth: [birth year]

## 2018-02-25 ENCOUNTER — Ambulatory Visit: Payer: Self-pay

## 2018-02-25 NOTE — Telephone Encounter (Signed)
Rec'd call from pt. requesting an appt. with her PCP.  Reported she has had swelling in both feet for approx. 5-6 days, with (R) > (L).   Denied swelling in lower legs.  Denied shortness of breath.  Also c/o "pain and puffiness" in inner aspect of right knee.  Stated the right knee has been hurting for about 2-3 days.  Denied redness of right knee; reported the right foot is somewhat red, near her right great toe. Stated it hurts to walk and climb stairs due to pain in right knee.   Reported her right knee was more painful last night, and improved a little today, but still hurts to walk on. Denied fever/ chills.  Denied any (R) calf swelling, redness, or tenderness.   Also, stated "my feet hurt and tingle all the time."  Appt. sched. for 02/26/18 @ 3:30 PM. With NP.  Advised to elevate her lower extremities above level of heart, and to try to avoid any physical strain on right knee.  Advised to go to the ER if sx's. of SOB, chest pain, worsening right knee pain, fever/ chills.  Verb. Understanding.              Reason for Disposition . [1] MODERATE pain (e.g., interferes with normal activities, limping) AND [2] present > 3 days  Answer Assessment - Initial Assessment Questions 1. ONSET: "When did the swelling start?" (e.g., minutes, hours, days)     5-6 days ago 2. LOCATION: "What part of the leg is swollen?"  "Are both legs swollen or just one leg?"     Bilateral feet; right is greater than left   3. SEVERITY: "How bad is the swelling?" (e.g., localized; mild, moderate, severe)  - Localized - small area of swelling localized to one leg  - MILD pedal edema - swelling limited to foot and ankle, pitting edema < 1/4 inch (6 mm) deep, rest and elevation eliminate most or all swelling  - MODERATE edema - swelling of lower leg to knee, pitting edema > 1/4 inch (6 mm) deep, rest and elevation only partially reduce swelling  - SEVERE edema - swelling extends above knee, facial or hand swelling present   Left foot mild; right foot moderate  4. REDNESS: "Does the swelling look red or infected?"    Some redness; right > left 5. PAIN: "Is the swelling painful to touch?" If so, ask: "How painful is it?"   (Scale 1-10; mild, moderate or severe)     Is on feet all the time; always has pain in feet Has 6. FEVER: "Do you have a fever?" If so, ask: "What is it, how was it measured, and when did it start?"      No  7. CAUSE: "What do you think is causing the leg swelling?"     unknown 8. MEDICAL HISTORY: "Do you have a history of heart failure, kidney disease, liver failure, or cancer?"     Denied any of above 9. RECURRENT SYMPTOM: "Have you had leg swelling before?" If so, ask: "When was the last time?" "What happened that time?"    Unusual to have swelling  10. OTHER SYMPTOMS: "Do you have any other symptoms?" (e.g., chest pain, difficulty breathing)       Denied SOB or chest pain 11. PREGNANCY: "Is there any chance you are pregnant?" "When was your last menstrual period?"       N/a  Answer Assessment - Initial Assessment Questions 1. LOCATION: "Where is the swelling located?"  (  e.g., left, right, both knees)     Right foot swelling; painful right knee; hurts to bear weight 2. SIZE and DESCRIPTION: "What does the swelling look like?"  (e.g., entire knee, localized)     Right knee swelling has improved, but painful.; soreness and puffy inner aspect of right knee.   3. ONSET: "When did the swelling start?" "Does it come and go, or is it there all the time?"     2-3 days ago 4. PAIN: "Is there any pain?" If so, ask: "How bad is it?" (Scale 1-10; or mild, moderate, severe)     About 6/10; last night was 8/10 5. SETTING: "Has there been any recent work, exercise or other activity that involved that part of the body?"      unaware 6. AGGRAVATING FACTORS: "What makes the knee swelling worse?" (e.g., walking, climbing stairs, running)     Walking and stair climbing hurts 7. ASSOCIATED SYMPTOMS: "Is  there any pain or redness?"     No redness on knee; some redness of foot 8. OTHER SYMPTOMS: "Do you have any other symptoms?" (e.g., chest pain, difficulty breathing, fever, calf pain)    Pain in the knee joint and feet hurt/ tingle 9. PREGNANCY: "Is there any chance you are pregnant?" "When was your last menstrual period?"    n/a  Protocols used: KNEE SWELLING-A-AH, LEG SWELLING AND EDEMA-A-AH

## 2018-02-26 ENCOUNTER — Encounter: Payer: Self-pay | Admitting: Adult Health

## 2018-02-26 ENCOUNTER — Ambulatory Visit (INDEPENDENT_AMBULATORY_CARE_PROVIDER_SITE_OTHER): Payer: MEDICARE | Admitting: Adult Health

## 2018-02-26 VITALS — BP 160/72 | Temp 98.2°F | Wt 120.0 lb

## 2018-02-26 DIAGNOSIS — M25561 Pain in right knee: Secondary | ICD-10-CM

## 2018-02-26 MED ORDER — METHYLPREDNISOLONE 4 MG PO TBPK: ORAL_TABLET | ORAL | 0 refills | Status: DC

## 2018-02-26 NOTE — Progress Notes (Signed)
Subjective:    Patient ID: Alicia Boyd, female    DOB: Feb 22, 1930, 84 y.o.   MRN: [id number]  HPI [name] presents with 5 days of right knee pain, stiffness and swelling.  She does not recall and injury or trauma to the knee.  She is having  difficulty weight-bearing.  She states the swelling subsides at night and worsens over the course of the day.  She is on her feet all day.  She does not have pain when she wakes up in the morning.  Pain begins and worsens throughout the day. She also feels like the knee will give out and she will fall. She has not tried any OTC medications for fear of how she will respond.  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   Review of Systems  Respiratory: Negative.   Cardiovascular: Negative.   Musculoskeletal: Positive for arthralgias, gait problem and joint swelling. Negative for myalgias, neck pain and neck stiffness.   Past Medical History:  Diagnosis Date  . Aortic heart murmur    AS/AI on 2D ECHO ; SBE prophylaxis Rxed  . Chest pain 2007 ; 2011   negative cardiac assessment  . Hyperlipidemia   . Interstitial cystitis    Resolved  . Osteoporosis    T score - 3.0 @ femoral neck; Fosamax affected swallowing  . Postmenopausal    No PMH or HRT    Social History   Socioeconomic History  . Marital status: Married    Spouse name: Not on file  . Number of children: Not on file  . Years of education: Not on file  . Highest education level: Not on file  Social Needs  . Financial resource strain: Not on file  . Food insecurity - worry: Not on file  . Food insecurity - inability: Not on file  . Transportation needs - medical: Not on file  . Transportation needs - non-medical: Not on file  Occupational History  . Not on file  Tobacco Use  . Smoking status: Never Smoker  . Smokeless tobacco: Never Used  Substance and Sexual Activity  . Alcohol use: No  . Drug use: No  . Sexual activity: Not on file  Other Topics  Concern  . Not on file  Social History Narrative  . Not on file    Past Surgical History:  Procedure Laterality Date  . COLONOSCOPY  2011   Dr Sharlett Iles    Family History  Problem Relation Age of Onset  . Diabetes Father        Pancreatitic insufficiency post MVA  . Hypertension Mother   . Osteoporosis Mother   . Heart disease Mother        AF  . Thyroid disease Sister        hypothyroidism  . Coronary artery disease Sister        AF  . Stroke Sister 39       also pulmonary disease    Allergies  Allergen Reactions  . Amitid [Amitriptyline Hcl]     Patient experienced a dizzy, spinning feeling   . Metronidazole     REACTION: throat swells  . Penicillins     REACTION: rash & fever  . Alendronate Sodium     REACTION: unspecified  . Clarithromycin     GI intolerance  . Flonase [Fluticasone Propionate]     Slight Headache   . Montelukast Other (See Comments)    unknown  . Promethazine Hcl  REACTION: unspecified  . Robitussin (Alcohol Free) [Guaifenesin] Other (See Comments)    unknown hyperactive  . Spironolactone     09/21/13 weakness in legs  . Singulair [Montelukast Sodium]     "spacey"; ineffective    Current Outpatient Medications on File Prior to Visit  Medication Sig Dispense Refill  . amLODipine (NORVASC) 5 MG tablet Take 1 tablet (5 mg total) by mouth daily. 90 tablet 3  . Calcium Carbonate (CALCIUM 600 PO) Take by mouth daily.    . Cholecalciferol (VITAMIN D3) 1000 UNITS CAPS Take by mouth daily.    . fexofenadine (ALLEGRA) 180 MG tablet Take 180 mg by mouth daily as needed.     Marland Kitchen ipratropium (ATROVENT) 0.03 % nasal spray USE 2 SPRAYS IN EACH NOSTRIL EVERY 12 HOURS. DO NOT USE MORE THAN 5 DAYS 60 mL 11  . metoprolol succinate (TOPROL-XL) 25 MG 24 hr tablet TAKE 1 TABLET BY MOUTH EVERY DAY 90 tablet 3  . Multiple Vitamin (MULTIVITAMIN) capsule Take 1 capsule by mouth daily.      Vladimir Faster Glycol-Propyl Glycol (SYSTANE OP) Apply to eye.    .  gabapentin (NEURONTIN) 100 MG capsule Take 1 capsule (100 mg total) by mouth at bedtime. (Patient not taking: Reported on 02/26/2018) 30 capsule 3   No current facility-administered medications on file prior to visit.     BP (!) 160/72 (BP Location: Left Arm)   Temp 98.2 F (36.8 C) (Oral)   Wt 120 lb (54.4 kg)   BMI 19.97 kg/m     Objective:   Physical Exam  Constitutional: She appears well-developed and well-nourished. No distress.  Musculoskeletal: She exhibits edema and tenderness.       Right knee: She exhibits swelling. She exhibits normal range of motion, no ecchymosis, no deformity, no erythema, normal patellar mobility and no bony tenderness. Tenderness found.       Legs: Bilateral calf circumference is 35 cm.  Crepitus present in right knee.   Skin: Skin is warm and dry.  Nursing note and vitals reviewed.     Assessment & Plan:  1. Acute pain of right knee Ice to knee as often as possible.  Will call with x-ray results. May take tylenol as needed for pain.  - XR KNEE 3 VIEW RIGHT - methylPREDNISolone (MEDROL DOSEPAK) 4 MG TBPK tablet; Take as directed  Dispense: 21 tablet; Refill: 0  Shada Nienaber C Jovanne Riggenbach BSN RN NP student

## 2018-02-26 NOTE — Progress Notes (Signed)
Subjective:    Patient ID: Alicia Boyd, female    DOB: 03-11-1930, 84 y.o.   MRN: [id number]  HPI  84 year old female who  has a past medical history of Aortic heart murmur, Chest pain (2007 ; 2011), Hyperlipidemia, Interstitial cystitis, Osteoporosis, and Postmenopausal.  She presents to the office today for an acute complaint of right knee swelling for for 5 days.  Also with associated stiffness and pain.  She denies any injury or trauma to the right knee.  Pain is worse with weightbearing.  Per patient the swelling subsides at night and worsens over the course of the day she is been on her feet for most of the day.  She denies any pain when she wakes up in the morning.  She does endorse feeling as though the knee will give out and she will fall but this is not happened.  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  Eyes any shortness of breath or chest pain.  Has not noticed any redness or warmth.   Review of Systems See HPI   Past Medical History:  Diagnosis Date  . Aortic heart murmur    AS/AI on 2D ECHO ; SBE prophylaxis Rxed  . Chest pain 2007 ; 2011   negative cardiac assessment  . Hyperlipidemia   . Interstitial cystitis    Resolved  . Osteoporosis    T score - 3.0 @ femoral neck; Fosamax affected swallowing  . Postmenopausal    No PMH or HRT    Social History   Socioeconomic History  . Marital status: Married    Spouse name: Not on file  . Number of children: Not on file  . Years of education: Not on file  . Highest education level: Not on file  Social Needs  . Financial resource strain: Not on file  . Food insecurity - worry: Not on file  . Food insecurity - inability: Not on file  . Transportation needs - medical: Not on file  . Transportation needs - non-medical: Not on file  Occupational History  . Not on file    Tobacco Use  . Smoking status: Never Smoker  . Smokeless tobacco: Never Used  Substance and Sexual Activity  . Alcohol use: No  . Drug use: No  . Sexual activity: Not on file  Other Topics Concern  . Not on file  Social History Narrative  . Not on file    Past Surgical History:  Procedure Laterality Date  . COLONOSCOPY  2011   Dr Sharlett Iles    Family History  Problem Relation Age of Onset  . Diabetes Father        Pancreatitic insufficiency post MVA  . Hypertension Mother   . Osteoporosis Mother   . Heart disease Mother        AF  . Thyroid disease Sister        hypothyroidism  . Coronary artery disease Sister        AF  . Stroke Sister 52       also pulmonary disease    Allergies  Allergen Reactions  . Amitid [Amitriptyline Hcl]     Patient experienced a dizzy, spinning feeling   . Metronidazole     REACTION: throat swells  . Penicillins     REACTION: rash & fever  . Alendronate Sodium  REACTION: unspecified  . Clarithromycin     GI intolerance  . Flonase [Fluticasone Propionate]     Slight Headache   . Montelukast Other (See Comments)    unknown  . Promethazine Hcl     REACTION: unspecified  . Robitussin (Alcohol Free) [Guaifenesin] Other (See Comments)    unknown hyperactive  . Spironolactone     09/21/13 weakness in legs  . Singulair [Montelukast Sodium]     "spacey"; ineffective    Current Outpatient Medications on File Prior to Visit  Medication Sig Dispense Refill  . amLODipine (NORVASC) 5 MG tablet Take 1 tablet (5 mg total) by mouth daily. 90 tablet 3  . Calcium Carbonate (CALCIUM 600 PO) Take by mouth daily.    . Cholecalciferol (VITAMIN D3) 1000 UNITS CAPS Take by mouth daily.    . fexofenadine (ALLEGRA) 180 MG tablet Take 180 mg by mouth daily as needed.     Marland Kitchen ipratropium (ATROVENT) 0.03 % nasal spray USE 2 SPRAYS IN EACH NOSTRIL EVERY 12 HOURS. DO NOT USE MORE THAN 5 DAYS 60 mL 11  . metoprolol succinate (TOPROL-XL) 25 MG 24 hr  tablet TAKE 1 TABLET BY MOUTH EVERY DAY 90 tablet 3  . Multiple Vitamin (MULTIVITAMIN) capsule Take 1 capsule by mouth daily.      Vladimir Faster Glycol-Propyl Glycol (SYSTANE OP) Apply to eye.    . gabapentin (NEURONTIN) 100 MG capsule Take 1 capsule (100 mg total) by mouth at bedtime. (Patient not taking: Reported on 02/26/2018) 30 capsule 3   No current facility-administered medications on file prior to visit.     BP (!) 160/72 (BP Location: Left Arm)   Temp 98.2 F (36.8 C) (Oral)   Wt 120 lb (54.4 kg)   BMI 19.97 kg/m       Objective:   Physical Exam  Constitutional: She is oriented to person, place, and time. She appears well-developed and well-nourished. No distress.  Cardiovascular: Normal rate, regular rhythm, normal heart sounds and intact distal pulses. Exam reveals no friction rub.  No murmur heard. Pulmonary/Chest: Effort normal and breath sounds normal. No respiratory distress. She has no wheezes. She has no rales. She exhibits no tenderness.  Musculoskeletal: She exhibits edema (+ 1 pitting edema around right knee. Trace swelling to right ankle. ) and tenderness. She exhibits no deformity.       Right knee: She exhibits decreased range of motion, swelling and bony tenderness. She exhibits no effusion, no deformity, no erythema, normal alignment, no LCL laxity, normal meniscus and no MCL laxity. Tenderness found. Medial joint line tenderness noted.  Bilateral calf circumference 35 inchs No calf redness or swelling   Neurological: She is alert and oriented to person, place, and time.  Skin: Skin is dry. No rash noted. She is not diaphoretic. No erythema. No pallor.  Psychiatric: She has a normal mood and affect. Her behavior is normal. Judgment and thought content normal.  Nursing note and vitals reviewed.     Assessment & Plan:  1. Acute pain of right knee -No concern for DVT, gout, septic knee.  Probable arthritic in nature.  Denies any trauma so I doubt meniscal or  ligament tear.  Will prescribe Medrol Dosepak and get an x-ray.  Consider referral to orthopedics - XR KNEE 3 VIEW RIGHT - methylPREDNISolone (MEDROL DOSEPAK) 4 MG TBPK tablet; Take as directed  Dispense: 21 tablet; Refill: 0   [name], NP

## 2018-02-26 NOTE — Patient Instructions (Signed)
Please get an xray of the right knee at your convenience   I have sent in some steroids for you to take to help with the swelling and pain

## 2018-03-02 ENCOUNTER — Encounter: Payer: MEDICARE | Admitting: Physical Therapy

## 2018-03-02 ENCOUNTER — Telehealth: Payer: Self-pay | Admitting: Adult Health

## 2018-03-02 ENCOUNTER — Ambulatory Visit (INDEPENDENT_AMBULATORY_CARE_PROVIDER_SITE_OTHER)
Admission: RE | Admit: 2018-03-02 | Discharge: 2018-03-02 | Disposition: A | Discharge status: Home / Self Care | Payer: MEDICARE | Source: Ambulatory Visit | Attending: Adult Health | Admitting: Adult Health

## 2018-03-02 DIAGNOSIS — M25561 Pain in right knee: Secondary | ICD-10-CM

## 2018-03-02 DIAGNOSIS — M25461 Effusion, right knee: Secondary | ICD-10-CM | POA: Diagnosis not present

## 2018-03-02 NOTE — Telephone Encounter (Signed)
Copied from [place] [id number]. Topic: Quick Communication - See Telephone Encounter >> Mar 02, 2018 12:52 PM Bea Graff, NT wrote: CRM for notification. See Telephone encounter for: Pt would like a call with the results from her knee xray she had done today. CB#: [phone]  03/02/18.

## 2018-03-03 NOTE — Telephone Encounter (Signed)
Pt notified of results/instructions and verbalized understanding. 

## 2018-03-04 ENCOUNTER — Ambulatory Visit: Payer: Self-pay

## 2018-03-04 NOTE — Telephone Encounter (Signed)
Returned call back to pt.  Reported she was evaluated recently for right knee pain.  Reported she was given results re: fluid on the knee.  Requested further explanation of the xray.  Explained results and reiiterated recommendations by [name].  Explained positioning of the knee and importance of rest, elevation and ice.  Also advised pt. she can alternate application of ice with heat.  Questions answered.  Encouraged to continue taking Tylenol for the pain, and to call if symptoms don't improve, or worsen.  Verb. Understanding.

## 2018-03-09 ENCOUNTER — Encounter: Payer: MEDICARE | Admitting: Physical Therapy

## 2018-03-11 ENCOUNTER — Ambulatory Visit (INDEPENDENT_AMBULATORY_CARE_PROVIDER_SITE_OTHER): Payer: MEDICARE | Admitting: Adult Health

## 2018-03-11 VITALS — BP 138/72 | Wt 120.0 lb

## 2018-03-11 DIAGNOSIS — M25561 Pain in right knee: Secondary | ICD-10-CM

## 2018-03-11 DIAGNOSIS — M7989 Other specified soft tissue disorders: Secondary | ICD-10-CM | POA: Diagnosis not present

## 2018-03-11 MED ORDER — FUROSEMIDE 20 MG PO TABS: ORAL_TABLET | ORAL | 0 refills | Status: DC

## 2018-03-11 NOTE — Progress Notes (Signed)
Subjective:    Patient ID: Alicia Boyd, female    DOB: 02-Oct-1930, 84 y.o.   MRN: [id number]  HPI Patient is seen again today for continued swelling of the right lower extremity from the mid thigh to the foot.  She did apply ice, rest and take some tylenol after her x-ray results and reports that she had some relied of pain.  About 1 week ago, she additionally developed edema in the left ankle and foot.  She reports that the swelling goes away overnight and returns around noon when she has been up and ambulating.  She denies shortness of breath, increase in abdominal girth, palpitations or chest pain.  She also denies any pain in the calf. She still continued pain in the right knee. She has not started any new medications, OTC products or herbal supplements. She is taking her prescribed medications as prescribed.    Review of Systems  Constitutional: Negative for activity change, chills, diaphoresis, fatigue and fever.  Respiratory: Negative for cough, chest tightness, shortness of breath and wheezing.   Cardiovascular: Negative.   Gastrointestinal: Negative for abdominal pain, constipation, nausea and vomiting.  Musculoskeletal: Positive for arthralgias, gait problem and joint swelling.  Neurological: Negative for dizziness, light-headedness and headaches.   Past Medical History:  Diagnosis Date  . Aortic heart murmur    AS/AI on 2D ECHO ; SBE prophylaxis Rxed  . Chest pain 2007 ; 2011   negative cardiac assessment  . Hyperlipidemia   . Interstitial cystitis    Resolved  . Osteoporosis    T score - 3.0 @ femoral neck; Fosamax affected swallowing  . Postmenopausal    No PMH or HRT    Social History   Socioeconomic History  . Marital status: Married    Spouse name: Not on file  . Number of children: Not on file  . Years of education: Not on file  . Highest education level: Not on file  Social Needs  . Financial resource strain: Not on file  . Food insecurity - worry:  Not on file  . Food insecurity - inability: Not on file  . Transportation needs - medical: Not on file  . Transportation needs - non-medical: Not on file  Occupational History  . Not on file  Tobacco Use  . Smoking status: Never Smoker  . Smokeless tobacco: Never Used  Substance and Sexual Activity  . Alcohol use: No  . Drug use: No  . Sexual activity: Not on file  Other Topics Concern  . Not on file  Social History Narrative  . Not on file    Past Surgical History:  Procedure Laterality Date  . COLONOSCOPY  2011   Dr Sharlett Iles    Family History  Problem Relation Age of Onset  . Diabetes Father        Pancreatitic insufficiency post MVA  . Hypertension Mother   . Osteoporosis Mother   . Heart disease Mother        AF  . Thyroid disease Sister        hypothyroidism  . Coronary artery disease Sister        AF  . Stroke Sister 14       also pulmonary disease    Allergies  Allergen Reactions  . Amitid [Amitriptyline Hcl]     Patient experienced a dizzy, spinning feeling   . Metronidazole     REACTION: throat swells  . Penicillins     REACTION: rash & fever  .  Alendronate Sodium     REACTION: unspecified  . Clarithromycin     GI intolerance  . Flonase [Fluticasone Propionate]     Slight Headache   . Montelukast Other (See Comments)    unknown  . Promethazine Hcl     REACTION: unspecified  . Robitussin (Alcohol Free) [Guaifenesin] Other (See Comments)    unknown hyperactive  . Spironolactone     09/21/13 weakness in legs  . Singulair [Montelukast Sodium]     "spacey"; ineffective    Current Outpatient Medications on File Prior to Visit  Medication Sig Dispense Refill  . amLODipine (NORVASC) 5 MG tablet Take 1 tablet (5 mg total) by mouth daily. 90 tablet 3  . Calcium Carbonate (CALCIUM 600 PO) Take by mouth daily.    . Cholecalciferol (VITAMIN D3) 1000 UNITS CAPS Take by mouth daily.    . fexofenadine (ALLEGRA) 180 MG tablet Take 180 mg by mouth daily  as needed.     . gabapentin (NEURONTIN) 100 MG capsule Take 1 capsule (100 mg total) by mouth at bedtime. (Patient not taking: Reported on 02/26/2018) 30 capsule 3  . ipratropium (ATROVENT) 0.03 % nasal spray USE 2 SPRAYS IN EACH NOSTRIL EVERY 12 HOURS. DO NOT USE MORE THAN 5 DAYS 60 mL 11  . methylPREDNISolone (MEDROL DOSEPAK) 4 MG TBPK tablet Take as directed 21 tablet 0  . metoprolol succinate (TOPROL-XL) 25 MG 24 hr tablet TAKE 1 TABLET BY MOUTH EVERY DAY 90 tablet 3  . Multiple Vitamin (MULTIVITAMIN) capsule Take 1 capsule by mouth daily.      Vladimir Faster Glycol-Propyl Glycol (SYSTANE OP) Apply to eye.     No current facility-administered medications on file prior to visit.     BP 138/72   Wt 120 lb (54.4 kg)   BMI 19.97 kg/m     Objective:   Physical Exam  Constitutional: She appears well-developed and well-nourished. No distress.  Cardiovascular: Normal rate and regular rhythm. Exam reveals no gallop and no friction rub.  Murmur heard. Pulses:      Dorsalis pedis pulses are 2+ on the right side, and 2+ on the left side.       Posterior tibial pulses are 2+ on the right side, and 2+ on the left side.  2/6 RUSB  Pulmonary/Chest: Effort normal and breath sounds normal. No respiratory distress. She has no wheezes. She has no rales.  Musculoskeletal: She exhibits edema. She exhibits no tenderness or deformity.       Right knee: She exhibits decreased range of motion and swelling. She exhibits no deformity, no erythema, normal alignment, no LCL laxity, no bony tenderness and no MCL laxity. No tenderness found.  1-2 + pitting edema to the RLE from the mid thigh down.  Non-pitting edema of the left ankle and foot.   Skin: She is not diaphoretic.  Nursing note and vitals reviewed.     Assessment & Plan:  1. Swelling of lower extremity Will check x-ray and notify of results. Will try small dose of lasix every other day until she is seen in 1 week. Keep legs elevated as much as  possible.  Consider referral to vascular specialist if edema does not improve.  - DG Abd 1 View; Future  Follow-up in 1 week or sooner if needed. Sudeep Scheibel C Braedon Sjogren BSN RN NP student

## 2018-03-11 NOTE — Patient Instructions (Signed)
I am going to prescribe you Lasix, take one pill every other day   I have placed an order for another x ray - please have this done before next week   Follow up with me this time next week

## 2018-03-12 ENCOUNTER — Ambulatory Visit (INDEPENDENT_AMBULATORY_CARE_PROVIDER_SITE_OTHER)
Admission: RE | Admit: 2018-03-12 | Discharge: 2018-03-12 | Disposition: A | Discharge status: Home / Self Care | Payer: MEDICARE | Source: Ambulatory Visit | Attending: Adult Health | Admitting: Adult Health

## 2018-03-12 DIAGNOSIS — M7989 Other specified soft tissue disorders: Secondary | ICD-10-CM

## 2018-03-12 DIAGNOSIS — N200 Calculus of kidney: Secondary | ICD-10-CM | POA: Diagnosis not present

## 2018-03-13 ENCOUNTER — Encounter: Payer: Self-pay | Admitting: Adult Health

## 2018-03-13 NOTE — Progress Notes (Signed)
Subjective:    Patient ID: Alicia Boyd, female    DOB: 04-Oct-1930, 84 y.o.   MRN: [id number]  HPI  84 year old female who  has a past medical history of Aortic heart murmur, Chest pain (2007 ; 2011), Hyperlipidemia, Interstitial cystitis, Osteoporosis, and Postmenopausal.  She presents to the office today for follow-up regarding continued swelling of the right lower extremity from approximately mid thigh to the foot right knee pain.  I last saw her for this on 02/26/2018 at which time an x-ray of the right knee was ordered.  This x-ray showed a small suprapatellar effusion.  She reports at home that she has been applying ice, resting and taking some Tylenol and has had some relief in the amount of pain she is been experiencing.  About 1 week ago she also reports she developed edema in the left ankle and foot.  She does report that this swelling in the goes away overnight and returned around noon when she is up been up ambulating.  She denies any chest pain, shortness of breath, calf pain, notes, or warmth.  She continues to have pain in the right knee.   Review of Systems See HPI   Past Medical History:  Diagnosis Date  . Aortic heart murmur    AS/AI on 2D ECHO ; SBE prophylaxis Rxed  . Chest pain 2007 ; 2011   negative cardiac assessment  . Hyperlipidemia   . Interstitial cystitis    Resolved  . Osteoporosis    T score - 3.0 @ femoral neck; Fosamax affected swallowing  . Postmenopausal    No PMH or HRT    Social History   Socioeconomic History  . Marital status: Married    Spouse name: Not on file  . Number of children: Not on file  . Years of education: Not on file  . Highest education level: Not on file  Social Needs  . Financial resource strain: Not on file  . Food insecurity - worry: Not on file  . Food insecurity - inability: Not on file  . Transportation needs - medical: Not on file  . Transportation needs - non-medical: Not on file  Occupational History  . Not  on file  Tobacco Use  . Smoking status: Never Smoker  . Smokeless tobacco: Never Used  Substance and Sexual Activity  . Alcohol use: No  . Drug use: No  . Sexual activity: Not on file  Other Topics Concern  . Not on file  Social History Narrative  . Not on file    Past Surgical History:  Procedure Laterality Date  . COLONOSCOPY  2011   Dr Sharlett Iles    Family History  Problem Relation Age of Onset  . Diabetes Father        Pancreatitic insufficiency post MVA  . Hypertension Mother   . Osteoporosis Mother   . Heart disease Mother        AF  . Thyroid disease Sister        hypothyroidism  . Coronary artery disease Sister        AF  . Stroke Sister 80       also pulmonary disease    Allergies  Allergen Reactions  . Amitid [Amitriptyline Hcl]     Patient experienced a dizzy, spinning feeling   . Metronidazole     REACTION: throat swells  . Penicillins     REACTION: rash & fever  . Alendronate Sodium     REACTION:  unspecified  . Clarithromycin     GI intolerance  . Flonase [Fluticasone Propionate]     Slight Headache   . Montelukast Other (See Comments)    unknown  . Promethazine Hcl     REACTION: unspecified  . Robitussin (Alcohol Free) [Guaifenesin] Other (See Comments)    unknown hyperactive  . Spironolactone     09/21/13 weakness in legs  . Singulair [Montelukast Sodium]     "spacey"; ineffective    Current Outpatient Medications on File Prior to Visit  Medication Sig Dispense Refill  . amLODipine (NORVASC) 5 MG tablet Take 1 tablet (5 mg total) by mouth daily. 90 tablet 3  . Calcium Carbonate (CALCIUM 600 PO) Take by mouth daily.    . Cholecalciferol (VITAMIN D3) 1000 UNITS CAPS Take by mouth daily.    . fexofenadine (ALLEGRA) 180 MG tablet Take 180 mg by mouth daily as needed.     . gabapentin (NEURONTIN) 100 MG capsule Take 1 capsule (100 mg total) by mouth at bedtime. (Patient not taking: Reported on 02/26/2018) 30 capsule 3  . ipratropium  (ATROVENT) 0.03 % nasal spray USE 2 SPRAYS IN EACH NOSTRIL EVERY 12 HOURS. DO NOT USE MORE THAN 5 DAYS 60 mL 11  . methylPREDNISolone (MEDROL DOSEPAK) 4 MG TBPK tablet Take as directed 21 tablet 0  . metoprolol succinate (TOPROL-XL) 25 MG 24 hr tablet TAKE 1 TABLET BY MOUTH EVERY DAY 90 tablet 3  . Multiple Vitamin (MULTIVITAMIN) capsule Take 1 capsule by mouth daily.      Vladimir Faster Glycol-Propyl Glycol (SYSTANE OP) Apply to eye.     No current facility-administered medications on file prior to visit.     BP 138/72   Wt 120 lb (54.4 kg)   BMI 19.97 kg/m       Objective:   Physical Exam  Constitutional: She is oriented to person, place, and time. She appears well-developed and well-nourished. No distress.  Cardiovascular: Normal rate, regular rhythm, normal heart sounds and intact distal pulses. Exam reveals no gallop and no friction rub.  No murmur heard. Pulmonary/Chest: Effort normal and breath sounds normal. No respiratory distress. She has no wheezes. She has no rales. She exhibits no tenderness.  Musculoskeletal: She exhibits edema and tenderness. She exhibits no deformity.  +1 pitting edema edema noted to right lower extremity from thigh to ankle.  She continues to have right knee pain that is more apparent with palpation along the heel aspect of the patella.  No calf tenderness, redness, warmth.  She does walk with a slow limping gait  Trace pitting edema noted to the left ankle   Neurological: She is alert and oriented to person, place, and time.  Skin: Skin is warm and dry. No rash noted. She is not diaphoretic. No erythema. No pallor.  Psychiatric: She has a normal mood and affect. Her behavior is normal. Judgment and thought content normal.  Nursing note and vitals reviewed.      Assessment & Plan:  1. Swelling of lower extremity -Get KUB to rule out any obstructing mass in the abdomen.  No concern for DVT at this point.  Possible venous insufficiency.  We will have  her try Lasix every other day.  Follow-up in 1 week or sooner if needed - DG Abd 1 View; Future - furosemide (LASIX) 20 MG tablet; Take one tab every other day  Dispense: 15 tablet; Refill: 0 -Consider referral to vascular for venous studies 2. Acute pain of right knee -Likely due  to edema.  Continue to ice, rest, and elevate. Take tylenol as needed  [name], NP

## 2018-03-16 ENCOUNTER — Ambulatory Visit: Payer: Self-pay

## 2018-03-16 NOTE — Telephone Encounter (Signed)
Patient called in saying "I saw [name] on last Thursday for swelling in my legs. He started me on Lasix 20 mg every other day. They told me I would be urinating a lot. I took the pill on Saturday and haven't taken it yet this morning. After taking it, I did not urinate any more than my usual. I was concerned because of what they said and I didn't know if I should take today's dose or what to do." I asked if the swelling is any worse than when she came into the office, she said "no, not really. It's the same." I asked about her breathing, she said "it's fine." I advised her to continue with the Lasix every other day as prescribed. I advised that she may or may not urinate more, but make sure she is taking as prescribed. I advised if she develops increased swelling, difficulty breathing, to call the office back to get an earlier appointment with [facility] or go to the UC/ED. She verbalized understanding. I advised of her follow up appointment this week on 03/19/18 at 1300 and [name] will be re-evaluating her swelling at that time, she verbalized understanding.  Reason for Disposition . General information question, no triage required and triager able to answer question  Answer Assessment - Initial Assessment Questions 1. REASON FOR CALL or QUESTION: "What is your reason for calling today?" or "How can I best help you?" or "What question do you have that I can help answer?"     I wanted to know should I take the lasix today  Protocols used: INFORMATION ONLY CALL-A-AH

## 2018-03-19 ENCOUNTER — Ambulatory Visit (INDEPENDENT_AMBULATORY_CARE_PROVIDER_SITE_OTHER): Payer: PRIVATE HEALTH INSURANCE | Admitting: Adult Health

## 2018-03-19 ENCOUNTER — Encounter: Payer: Self-pay | Admitting: Adult Health

## 2018-03-19 VITALS — BP 150/82 | Temp 99.0°F | Wt 119.0 lb

## 2018-03-19 DIAGNOSIS — R300 Dysuria: Secondary | ICD-10-CM

## 2018-03-19 DIAGNOSIS — M7989 Other specified soft tissue disorders: Secondary | ICD-10-CM

## 2018-03-19 DIAGNOSIS — R600 Localized edema: Secondary | ICD-10-CM | POA: Diagnosis not present

## 2018-03-19 LAB — POC URINALSYSI DIPSTICK (AUTOMATED)
BILIRUBIN UA: NEGATIVE
Glucose, UA: NEGATIVE
Ketones, UA: NEGATIVE
LEUKOCYTES UA: NEGATIVE
NITRITE UA: NEGATIVE
PH UA: 6 (ref 5.0–8.0)
PROTEIN UA: NEGATIVE
RBC UA: NEGATIVE
Spec Grav, UA: 1.015 (ref 1.010–1.025)
UROBILINOGEN UA: 0.2 U/dL

## 2018-03-19 NOTE — Progress Notes (Signed)
Subjective:    Patient ID: Alicia Boyd, female    DOB: 12-28-1930, 84 y.o.   MRN: [id number]  HPI Mrs Poche presents for follow up of lower extremity swelling.  She was seen on 3/13 with bilateral lower extremity edema right greater than left.  She was prescribed a trial of lasix taper.  She reports that she hasn't really being voiding as much as she expected and that she is voiding her usual amount.  She did have some dysuria a few nights ago.  Her edema has slightly improved in the knee area but she is still having swelling in bilateral lower extremities.    Review of Systems  Constitutional: Positive for appetite change and fatigue. Negative for chills and fever.  Respiratory: Negative for shortness of breath.   Cardiovascular: Positive for leg swelling. Negative for chest pain and palpitations.  Genitourinary: Positive for dysuria. Negative for difficulty urinating, frequency, hematuria and urgency.  Psychiatric/Behavioral: Negative for confusion.   Past Medical History:  Diagnosis Date  . Aortic heart murmur    AS/AI on 2D ECHO ; SBE prophylaxis Rxed  . Chest pain 2007 ; 2011   negative cardiac assessment  . Hyperlipidemia   . Interstitial cystitis    Resolved  . Osteoporosis    T score - 3.0 @ femoral neck; Fosamax affected swallowing  . Postmenopausal    No PMH or HRT    Social History   Socioeconomic History  . Marital status: Married    Spouse name: Not on file  . Number of children: Not on file  . Years of education: Not on file  . Highest education level: Not on file  Occupational History  . Not on file  Social Needs  . Financial resource strain: Not on file  . Food insecurity:    Worry: Not on file    Inability: Not on file  . Transportation needs:    Medical: Not on file    Non-medical: Not on file  Tobacco Use  . Smoking status: Never Smoker  . Smokeless tobacco: Never Used  Substance and Sexual Activity  . Alcohol use: No  . Drug use: No  .  Sexual activity: Not on file  Lifestyle  . Physical activity:    Days per week: Not on file    Minutes per session: Not on file  . Stress: Not on file  Relationships  . Social connections:    Talks on phone: Not on file    Gets together: Not on file    Attends religious service: Not on file    Active member of club or organization: Not on file    Attends meetings of clubs or organizations: Not on file    Relationship status: Not on file  . Intimate partner violence:    Fear of current or ex partner: Not on file    Emotionally abused: Not on file    Physically abused: Not on file    Forced sexual activity: Not on file  Other Topics Concern  . Not on file  Social History Narrative  . Not on file    Past Surgical History:  Procedure Laterality Date  . COLONOSCOPY  2011   Dr Sharlett Iles    Family History  Problem Relation Age of Onset  . Diabetes Father        Pancreatitic insufficiency post MVA  . Hypertension Mother   . Osteoporosis Mother   . Heart disease Mother  AF  . Thyroid disease Sister        hypothyroidism  . Coronary artery disease Sister        AF  . Stroke Sister 49       also pulmonary disease    Allergies  Allergen Reactions  . Amitid [Amitriptyline Hcl]     Patient experienced a dizzy, spinning feeling   . Metronidazole     REACTION: throat swells  . Penicillins     REACTION: rash & fever  . Alendronate Sodium     REACTION: unspecified  . Clarithromycin     GI intolerance  . Flonase [Fluticasone Propionate]     Slight Headache   . Montelukast Other (See Comments)    unknown  . Promethazine Hcl     REACTION: unspecified  . Robitussin (Alcohol Free) [Guaifenesin] Other (See Comments)    unknown hyperactive  . Spironolactone     09/21/13 weakness in legs  . Singulair [Montelukast Sodium]     "spacey"; ineffective    Current Outpatient Medications on File Prior to Visit  Medication Sig Dispense Refill  . amLODipine (NORVASC) 5 MG  tablet Take 1 tablet (5 mg total) by mouth daily. 90 tablet 3  . Calcium Carbonate (CALCIUM 600 PO) Take by mouth daily.    . Cholecalciferol (VITAMIN D3) 1000 UNITS CAPS Take by mouth daily.    . fexofenadine (ALLEGRA) 180 MG tablet Take 180 mg by mouth daily as needed.     . furosemide (LASIX) 20 MG tablet Take one tab every other day 15 tablet 0  . gabapentin (NEURONTIN) 100 MG capsule Take 1 capsule (100 mg total) by mouth at bedtime. 30 capsule 3  . ipratropium (ATROVENT) 0.03 % nasal spray USE 2 SPRAYS IN EACH NOSTRIL EVERY 12 HOURS. DO NOT USE MORE THAN 5 DAYS 60 mL 11  . metoprolol succinate (TOPROL-XL) 25 MG 24 hr tablet TAKE 1 TABLET BY MOUTH EVERY DAY 90 tablet 3  . Multiple Vitamin (MULTIVITAMIN) capsule Take 1 capsule by mouth daily.      Vladimir Faster Glycol-Propyl Glycol (SYSTANE OP) Apply to eye.     No current facility-administered medications on file prior to visit.     BP (!) 170/72 (BP Location: Left Arm)   Temp 99 F (37.2 C) (Oral)   Wt 119 lb (54 kg)   BMI 19.80 kg/m  Resting heart rate is 78. Resting BP recheck is 168/78    Objective:   Physical Exam  Constitutional: She appears well-developed and well-nourished. No distress.  Cardiovascular: Normal rate, regular rhythm and normal heart sounds. Exam reveals no gallop and no friction rub.  No murmur heard. Pulses:      Radial pulses are 3+ on the right side, and 3+ on the left side.       Dorsalis pedis pulses are 2+ on the right side, and 2+ on the left side.       Posterior tibial pulses are 2+ on the right side, and 2+ on the left side.  1+ pitting edema to bilateral lower extremities.  Right edema remains greater than left.   Pulmonary/Chest: Effort normal and breath sounds normal. No respiratory distress. She has no wheezes. She has no rales.  Skin: She is not diaphoretic.  Nursing note and vitals reviewed.     Assessment & Plan:  1. Swelling of lower extremity Will check labs and notify results.   Continue out lasix dosing every other day.  2. Lower extremity edema Edema  is gradually improving.  Will continue QOD lasix for the remaining 12 doses.  Will follow up after completion of course.  - Basic Metabolic Panel  Huck Ashworth C Koreen Lizaola BSN RN NP student

## 2018-03-19 NOTE — Progress Notes (Signed)
Subjective:    Patient ID: Alicia Boyd, female    DOB: Jun 01, 1930, 84 y.o.   MRN: [id number]  HPI   84 year old female who  has a past medical history of Aortic heart murmur, Chest pain (2007 ; 2011), Hyperlipidemia, Interstitial cystitis, Osteoporosis, and Postmenopausal.  She presents to the office today for one-week follow-up regarding lower extremity swelling.  She was seen on 313 with the complaint of bilateral lower extremity edema which was greater in the right than the left.  I prescribed her a trial of Lasix 20 mg to be in every other day.  Per patient she is not voiding as much as she was expecting to and is voiding what she considers "normal".  She has noticed that her edema has slightly improved around the knee area but that she still having swelling in her bilateral lower extremities.  She does endorse improved pain especially with ambulation to the right knee since she started taking Lasix.  She also reports that she did have some dysuria a few nights ago and she is wondering if she has a start of a urinary tract infection.  He denies any low back pain, frequency, urgency past baseline  Review of Systems See HPI   Past Medical History:  Diagnosis Date  . Aortic heart murmur    AS/AI on 2D ECHO ; SBE prophylaxis Rxed  . Chest pain 2007 ; 2011   negative cardiac assessment  . Hyperlipidemia   . Interstitial cystitis    Resolved  . Osteoporosis    T score - 3.0 @ femoral neck; Fosamax affected swallowing  . Postmenopausal    No PMH or HRT    Social History   Socioeconomic History  . Marital status: Married    Spouse name: Not on file  . Number of children: Not on file  . Years of education: Not on file  . Highest education level: Not on file  Occupational History  . Not on file  Social Needs  . Financial resource strain: Not on file  . Food insecurity:    Worry: Not on file    Inability: Not on file  . Transportation needs:    Medical: Not on file   Non-medical: Not on file  Tobacco Use  . Smoking status: Never Smoker  . Smokeless tobacco: Never Used  Substance and Sexual Activity  . Alcohol use: No  . Drug use: No  . Sexual activity: Not on file  Lifestyle  . Physical activity:    Days per week: Not on file    Minutes per session: Not on file  . Stress: Not on file  Relationships  . Social connections:    Talks on phone: Not on file    Gets together: Not on file    Attends religious service: Not on file    Active member of club or organization: Not on file    Attends meetings of clubs or organizations: Not on file    Relationship status: Not on file  . Intimate partner violence:    Fear of current or ex partner: Not on file    Emotionally abused: Not on file    Physically abused: Not on file    Forced sexual activity: Not on file  Other Topics Concern  . Not on file  Social History Narrative  . Not on file    Past Surgical History:  Procedure Laterality Date  . COLONOSCOPY  2011   Dr Sharlett Iles  Family History  Problem Relation Age of Onset  . Diabetes Father        Pancreatitic insufficiency post MVA  . Hypertension Mother   . Osteoporosis Mother   . Heart disease Mother        AF  . Thyroid disease Sister        hypothyroidism  . Coronary artery disease Sister        AF  . Stroke Sister 74       also pulmonary disease    Allergies  Allergen Reactions  . Amitid [Amitriptyline Hcl]     Patient experienced a dizzy, spinning feeling   . Metronidazole     REACTION: throat swells  . Penicillins     REACTION: rash & fever  . Alendronate Sodium     REACTION: unspecified  . Clarithromycin     GI intolerance  . Flonase [Fluticasone Propionate]     Slight Headache   . Montelukast Other (See Comments)    unknown  . Promethazine Hcl     REACTION: unspecified  . Robitussin (Alcohol Free) [Guaifenesin] Other (See Comments)    unknown hyperactive  . Spironolactone     09/21/13 weakness in legs  .  Singulair [Montelukast Sodium]     "spacey"; ineffective    Current Outpatient Medications on File Prior to Visit  Medication Sig Dispense Refill  . amLODipine (NORVASC) 5 MG tablet Take 1 tablet (5 mg total) by mouth daily. 90 tablet 3  . Calcium Carbonate (CALCIUM 600 PO) Take by mouth daily.    . Cholecalciferol (VITAMIN D3) 1000 UNITS CAPS Take by mouth daily.    . fexofenadine (ALLEGRA) 180 MG tablet Take 180 mg by mouth daily as needed.     . furosemide (LASIX) 20 MG tablet Take one tab every other day 15 tablet 0  . gabapentin (NEURONTIN) 100 MG capsule Take 1 capsule (100 mg total) by mouth at bedtime. 30 capsule 3  . ipratropium (ATROVENT) 0.03 % nasal spray USE 2 SPRAYS IN EACH NOSTRIL EVERY 12 HOURS. DO NOT USE MORE THAN 5 DAYS 60 mL 11  . metoprolol succinate (TOPROL-XL) 25 MG 24 hr tablet TAKE 1 TABLET BY MOUTH EVERY DAY 90 tablet 3  . Multiple Vitamin (MULTIVITAMIN) capsule Take 1 capsule by mouth daily.      Vladimir Faster Glycol-Propyl Glycol (SYSTANE OP) Apply to eye.     No current facility-administered medications on file prior to visit.     BP (!) 150/82 (BP Location: Left Arm)   Temp 99 F (37.2 C) (Oral)   Wt 119 lb (54 kg)   BMI 19.80 kg/m       Objective:   Physical Exam  Constitutional: She is oriented to person, place, and time. She appears well-developed and well-nourished. No distress.  Cardiovascular: Normal rate, regular rhythm, normal heart sounds and intact distal pulses. Exam reveals no gallop and no friction rub.  No murmur heard. Musculoskeletal: Normal range of motion. She exhibits edema ('s 1 pitting edema to bilateral lower extremities.  Right greater than left.) and tenderness (To needs to have slight discomfort with palpation along the lateral aspect of right knee.).  Neurological: She is alert and oriented to person, place, and time.  Skin: Skin is warm and dry. No rash noted. She is not diaphoretic. No erythema. No pallor.  Psychiatric: She  has a normal mood and affect. Her behavior is normal. Judgment and thought content normal.  Nursing note and vitals reviewed.  Assessment & Plan:  1. Lower extremity edema -Peers to be improving slightly since starting Lasix.  She is only been taking Lasix for 3 total days.  I am going to have her follow-up in 2 weeks after she finishes her Lasix course and see how she is doing at that period in time.  We will consider referral to the vein and vascular if needed - Basic Metabolic Panel - POCT Urinalysis Dipstick (Automated)  2. Dysuria - POCT Urinalysis Dipstick (Automated)- negative  -We will have her increase water consumption -Follow-up as needed   [name], NP

## 2018-03-20 LAB — BASIC METABOLIC PANEL
BUN: 11 mg/dL (ref 6–23)
CALCIUM: 9.6 mg/dL (ref 8.4–10.5)
CHLORIDE: 100 meq/L (ref 96–112)
CO2: 31 meq/L (ref 19–32)
Creatinine, Ser: 0.46 mg/dL (ref 0.40–1.20)
GFR: 137.1 mL/min (ref >60.00)
Glucose, Bld: 110 mg/dL — ABNORMAL HIGH (ref 70–99)
Potassium: 3.9 mEq/L (ref 3.5–5.1)
SODIUM: 138 meq/L (ref 135–145)

## 2018-03-24 ENCOUNTER — Telehealth: Payer: Self-pay | Admitting: Family Medicine

## 2018-03-24 NOTE — Telephone Encounter (Signed)
I would like her to take it till the end of her prescription. She can drink more water throughout the day, which is always advised. Her most recent labs did not show any dehydration

## 2018-03-24 NOTE — Telephone Encounter (Signed)
Spoke to the pt and informed her of lab results.  She mentioned that she continues to take Lasix every other day.  She does not have frequency of urination.  Pt is wondering if she should continue to take the medication.  Could she become dehydrated?  Please advise.

## 2018-03-25 NOTE — Telephone Encounter (Signed)
Pt notified of instructions.  Agreed to plan.  Will call back if needed.

## 2018-04-05 ENCOUNTER — Other Ambulatory Visit: Payer: Self-pay | Admitting: Adult Health

## 2018-04-07 ENCOUNTER — Telehealth: Payer: Self-pay | Admitting: *Deleted

## 2018-04-07 NOTE — Telephone Encounter (Signed)
lmovm for pt to return call.  

## 2018-04-07 NOTE — Telephone Encounter (Signed)
Copied from [place] [id number]. Topic: Quick Communication - See Telephone Encounter >> Apr 06, 2018  3:32 PM Antonieta Iba C wrote: CRM for notification. See Telephone encounter for: 04/06/18.  Pt called in to be advised pt says that she was referred to have therapy. Pt says that she has 2 more visits that she will be complete. Pt called in to ask if she should complete the last 2 visits OR should she go ahead and see provider. Pt scheduled an apt.   Please advise.   CB: [phone]

## 2018-04-07 NOTE — Telephone Encounter (Signed)
Ok to refill for 90 +1 

## 2018-04-07 NOTE — Telephone Encounter (Signed)
I am fine with whatever.  Up to her.

## 2018-04-07 NOTE — Telephone Encounter (Signed)
Pt has not seen you for annual visit.

## 2018-04-08 NOTE — Telephone Encounter (Signed)
Sent to the pharmacy by e-scribe as directed. 

## 2018-04-08 NOTE — Telephone Encounter (Signed)
Spoke with pt, she would like to come see dr Tamala Julian before returning to PT. Scheduled appt 4.18.19 to see Dr. Tamala Julian.

## 2018-04-10 ENCOUNTER — Other Ambulatory Visit: Payer: Self-pay | Admitting: Adult Health

## 2018-04-10 DIAGNOSIS — M7989 Other specified soft tissue disorders: Secondary | ICD-10-CM

## 2018-04-15 ENCOUNTER — Encounter: Payer: Self-pay | Admitting: Adult Health

## 2018-04-15 ENCOUNTER — Ambulatory Visit (INDEPENDENT_AMBULATORY_CARE_PROVIDER_SITE_OTHER): Payer: MEDICARE | Admitting: Adult Health

## 2018-04-15 VITALS — BP 145/70 | Temp 98.6°F | Wt 118.0 lb

## 2018-04-15 DIAGNOSIS — G8929 Other chronic pain: Secondary | ICD-10-CM | POA: Diagnosis not present

## 2018-04-15 DIAGNOSIS — M25561 Pain in right knee: Secondary | ICD-10-CM

## 2018-04-15 DIAGNOSIS — R600 Localized edema: Secondary | ICD-10-CM | POA: Diagnosis not present

## 2018-04-15 NOTE — Progress Notes (Signed)
Subjective:    Patient ID: Alicia Boyd, female    DOB: 18-Mar-1930, 84 y.o.   MRN: [id number]  HPI  84 year old female who  has a past medical history of Aortic heart murmur, Chest pain (2007 ; 2011), Hyperlipidemia, Interstitial cystitis, Osteoporosis, and Postmenopausal.  She presents today for follow up regarding lower extremity edema. Over the last month I have had her take Lasix 20 mg every other day. She has been out of medication for the last 3-4 days. Today in the office she reports that she feels as though both legs are less swollen and she no longer has pain in the lower extremities. She does continues to have pain in right knee but this is intermittent and worse in the morning ( no pain today).    Review of Systems See HPI   Past Medical History:  Diagnosis Date  . Aortic heart murmur    AS/AI on 2D ECHO ; SBE prophylaxis Rxed  . Chest pain 2007 ; 2011   negative cardiac assessment  . Hyperlipidemia   . Interstitial cystitis    Resolved  . Osteoporosis    T score - 3.0 @ femoral neck; Fosamax affected swallowing  . Postmenopausal    No PMH or HRT    Social History   Socioeconomic History  . Marital status: Married    Spouse name: Not on file  . Number of children: Not on file  . Years of education: Not on file  . Highest education level: Not on file  Occupational History  . Not on file  Social Needs  . Financial resource strain: Not on file  . Food insecurity:    Worry: Not on file    Inability: Not on file  . Transportation needs:    Medical: Not on file    Non-medical: Not on file  Tobacco Use  . Smoking status: Never Smoker  . Smokeless tobacco: Never Used  Substance and Sexual Activity  . Alcohol use: No  . Drug use: No  . Sexual activity: Not on file  Lifestyle  . Physical activity:    Days per week: Not on file    Minutes per session: Not on file  . Stress: Not on file  Relationships  . Social connections:    Talks on phone: Not on  file    Gets together: Not on file    Attends religious service: Not on file    Active member of club or organization: Not on file    Attends meetings of clubs or organizations: Not on file    Relationship status: Not on file  . Intimate partner violence:    Fear of current or ex partner: Not on file    Emotionally abused: Not on file    Physically abused: Not on file    Forced sexual activity: Not on file  Other Topics Concern  . Not on file  Social History Narrative  . Not on file    Past Surgical History:  Procedure Laterality Date  . COLONOSCOPY  2011   Dr Sharlett Iles    Family History  Problem Relation Age of Onset  . Diabetes Father        Pancreatitic insufficiency post MVA  . Hypertension Mother   . Osteoporosis Mother   . Heart disease Mother        AF  . Thyroid disease Sister        hypothyroidism  . Coronary artery disease Sister  AF  . Stroke Sister 27       also pulmonary disease    Allergies  Allergen Reactions  . Amitid [Amitriptyline Hcl]     Patient experienced a dizzy, spinning feeling   . Metronidazole     REACTION: throat swells  . Penicillins     REACTION: rash & fever  . Alendronate Sodium     REACTION: unspecified  . Clarithromycin     GI intolerance  . Flonase [Fluticasone Propionate]     Slight Headache   . Montelukast Other (See Comments)    unknown  . Promethazine Hcl     REACTION: unspecified  . Robitussin (Alcohol Free) [Guaifenesin] Other (See Comments)    unknown hyperactive  . Spironolactone     09/21/13 weakness in legs  . Singulair [Montelukast Sodium]     "spacey"; ineffective    Current Outpatient Medications on File Prior to Visit  Medication Sig Dispense Refill  . amLODipine (NORVASC) 5 MG tablet TAKE 1 TABLET BY MOUTH EVERY DAY 90 tablet 1  . Calcium Carbonate (CALCIUM 600 PO) Take by mouth daily.    . Cholecalciferol (VITAMIN D3) 1000 UNITS CAPS Take by mouth daily.    . fexofenadine (ALLEGRA) 180 MG  tablet Take 180 mg by mouth daily as needed.     . gabapentin (NEURONTIN) 100 MG capsule Take 1 capsule (100 mg total) by mouth at bedtime. 30 capsule 3  . ipratropium (ATROVENT) 0.03 % nasal spray USE 2 SPRAYS IN EACH NOSTRIL EVERY 12 HOURS. DO NOT USE MORE THAN 5 DAYS 60 mL 11  . metoprolol succinate (TOPROL-XL) 25 MG 24 hr tablet TAKE 1 TABLET BY MOUTH EVERY DAY 90 tablet 1  . Multiple Vitamin (MULTIVITAMIN) capsule Take 1 capsule by mouth daily.      Vladimir Faster Glycol-Propyl Glycol (SYSTANE OP) Apply to eye.     No current facility-administered medications on file prior to visit.     BP (!) 145/70   Temp 98.6 F (37 C) (Oral)   Wt 118 lb (53.5 kg)   BMI 19.64 kg/m       Objective:   Physical Exam  Constitutional: She is oriented to person, place, and time. She appears well-developed and well-nourished. No distress.  Cardiovascular: Normal rate, regular rhythm, normal heart sounds and intact distal pulses. Exam reveals no gallop and no friction rub.  No murmur heard. Pulmonary/Chest: Effort normal and breath sounds normal. No respiratory distress. She has no wheezes. She has no rales. She exhibits no tenderness.  Musculoskeletal: Normal range of motion. She exhibits edema (Trace + 1 pitting edema in bilateral lower extremities R>L). She exhibits no tenderness or deformity.  Neurological: She is alert and oriented to person, place, and time.  Skin: Skin is warm and dry. No rash noted. She is not diaphoretic. No erythema. No pallor.  Psychiatric: She has a normal mood and affect. Her behavior is normal. Judgment and thought content normal.  Nursing note and vitals reviewed.     Assessment & Plan:  1. Lower extremity edema - much improved.  - We opted to stop lasix and see if edema returns.  - Follow up as needed  2. Chronic pain of right knee - Likely arthritis  - conservative measures at this point  - can trial steroid injection if she would like   [name], NP

## 2018-04-16 ENCOUNTER — Ambulatory Visit (INDEPENDENT_AMBULATORY_CARE_PROVIDER_SITE_OTHER): Payer: MEDICARE | Admitting: Family Medicine

## 2018-04-16 ENCOUNTER — Encounter: Payer: Self-pay | Admitting: Family Medicine

## 2018-04-16 DIAGNOSIS — M1712 Unilateral primary osteoarthritis, left knee: Secondary | ICD-10-CM | POA: Diagnosis not present

## 2018-04-16 DIAGNOSIS — G5601 Carpal tunnel syndrome, right upper limb: Secondary | ICD-10-CM

## 2018-04-16 NOTE — Patient Instructions (Signed)
Good to see you  Injected the carpal tunnel again  Ice is your friend Wear the brace at night  Injected the knee as well  pennsaid pinkie amount topically 2 times daily as needed.   See me again in 4 weeks

## 2018-04-16 NOTE — Assessment & Plan Note (Signed)
Degenerative left knee.  Given injection.  Tolerated procedure well.  Discussed icing regimen.  Could be a candidate for Visco supplementation.  Follow-up again in 4-6 weeks abnormal thigh to calf ratio may need custom bracing

## 2018-04-16 NOTE — Progress Notes (Signed)
[facility] [address] [place], [place] [address] Phone: [phone] Subjective:    I'm seeing this patient by the request  of:    CC: Right wrist and left knee pain follow-up  [id number]  Alicia Boyd is a 84 y.o. female coming in with complaint of right wrist pain.  Found to have carpal tunnel.  Given injection greater than a year ago.  Started having worsening discomfort and pain again at this time.  Patient did physical therapy with minimal benefit.  Constant numbness noted.  Denies any neck pain associated with it.  Patient states that it is affecting daily activities.  Patient is also having knee pain.  Has been told she has had arthritis previously.  Worsening discomfort and pain at this time.  Left is worse than right.    Past Medical History:  Diagnosis Date  . Aortic heart murmur    AS/AI on 2D ECHO ; SBE prophylaxis Rxed  . Chest pain 2007 ; 2011   negative cardiac assessment  . Hyperlipidemia   . Interstitial cystitis    Resolved  . Osteoporosis    T score - 3.0 @ femoral neck; Fosamax affected swallowing  . Postmenopausal    No PMH or HRT   Past Surgical History:  Procedure Laterality Date  . COLONOSCOPY  2011   Dr Sharlett Iles   Social History   Socioeconomic History  . Marital status: Married    Spouse name: Not on file  . Number of children: Not on file  . Years of education: Not on file  . Highest education level: Not on file  Occupational History  . Not on file  Social Needs  . Financial resource strain: Not on file  . Food insecurity:    Worry: Not on file    Inability: Not on file  . Transportation needs:    Medical: Not on file    Non-medical: Not on file  Tobacco Use  . Smoking status: Never Smoker  . Smokeless tobacco: Never Used  Substance and Sexual Activity  . Alcohol use: No  . Drug use: No  . Sexual activity: Not on file  Lifestyle  . Physical activity:    Days per week: Not on file   Minutes per session: Not on file  . Stress: Not on file  Relationships  . Social connections:    Talks on phone: Not on file    Gets together: Not on file    Attends religious service: Not on file    Active member of club or organization: Not on file    Attends meetings of clubs or organizations: Not on file    Relationship status: Not on file  Other Topics Concern  . Not on file  Social History Narrative  . Not on file   Allergies  Allergen Reactions  . Amitid [Amitriptyline Hcl]     Patient experienced a dizzy, spinning feeling   . Metronidazole     REACTION: throat swells  . Penicillins     REACTION: rash & fever  . Alendronate Sodium     REACTION: unspecified  . Clarithromycin     GI intolerance  . Flonase [Fluticasone Propionate]     Slight Headache   . Montelukast Other (See Comments)    unknown  . Promethazine Hcl     REACTION: unspecified  . Robitussin (Alcohol Free) [Guaifenesin] Other (See Comments)    unknown hyperactive  . Spironolactone     09/21/13  weakness in legs  . Singulair [Montelukast Sodium]     "spacey"; ineffective   Family History  Problem Relation Age of Onset  . Diabetes Father        Pancreatitic insufficiency post MVA  . Hypertension Mother   . Osteoporosis Mother   . Heart disease Mother        AF  . Thyroid disease Sister        hypothyroidism  . Coronary artery disease Sister        AF  . Stroke Sister 66       also pulmonary disease     Past medical history, social, surgical and family history all reviewed in electronic medical record.  No pertanent information unless stated regarding to the chief complaint.   Review of Systems:Review of systems updated and as accurate as of 04/16/18  No headache, visual changes, nausea, vomiting, diarrhea, constipation, dizziness, abdominal pain, skin rash, fevers, chills, night sweats, weight loss, swollen lymph nodes, body aches, joint swelling, chest pain, shortness of breath, mood  changes.  Positive muscle aches  Objective  Blood pressure 122/78, pulse 79, height 5\' 5"  (1.651 m), weight 118 lb (53.5 kg), SpO2 97 %. Systems examined below as of 04/16/18   General: No apparent distress alert and oriented x3 mood and affect normal, dressed appropriately.  HEENT: Pupils equal, extraocular movements intact  Respiratory: Patient's speak in full sentences and does not appear short of breath  Cardiovascular: No lower extremity edema, non tender, no erythema  Skin: Warm dry intact with no signs of infection or rash on extremities or on axial skeleton.  Abdomen: Soft nontender  Neuro: Cranial nerves II through XII are intact, neurovascularly intact in all extremities with 2+ DTRs and 2+ pulses.  Lymph: No lymphadenopathy of posterior or anterior cervical chain or axillae bilaterally.  Gait normal with good balance and coordination.  MSK:  Non tender with full range of motion and good stability and symmetric strength and tone of shoulders, elbows, , hip, and ankles bilaterally.  Right wrist additional Tinel's positive.  Patient does have some arthritic changes.  Has had some mild pain  Knee: Left valgus deformity noted.  Abnormal thigh to calf ratio.  Tender to palpation over medial and PF joint line.  ROM full in flexion and extension and lower leg rotation. instability with valgus force.  painful patellar compression. Patellar glide with moderate crepitus. Patellar and quadriceps tendons unremarkable. Hamstring and quadriceps strength is normal. Contralateral knee shows moderate arthritic changes  Procedure: Real-time Ultrasound Guided Injection of right carpal tunnel Device: GE Logiq Q7  Ultrasound guided injection is preferred based studies that show increased duration, increased effect, greater accuracy, decreased procedural pain, increased response rate with ultrasound guided versus blind injection.  Verbal informed consent obtained.  Time-out conducted.  Noted no  overlying erythema, induration, or other signs of local infection.  Skin prepped in a sterile fashion.  Local anesthesia: Topical Ethyl chloride.  With sterile technique and under real time ultrasound guidance:  median nerve visualized.  23g 5/8 inch needle inserted distal to proximal approach into nerve sheath. Pictures taken nfor needle placement. Patient did have injection of 2 cc of 1% lidocaine, 1 cc of 0.5% Marcaine, and 1 cc of Kenalog 40 mg/dL. Completed without difficulty  Pain immediately resolved suggesting accurate placement of the medication.  Advised to call if fevers/chills, erythema, induration, drainage, or persistent bleeding.  Images permanently stored and available for review in the ultrasound unit.  Impression: Technically  successful ultrasound guided injection.  After informed written and verbal consent, patient was seated on exam table. Left knee was prepped with alcohol swab and utilizing anterolateral approach, patient's left knee space was injected with 4:1  marcaine 0.5%: Kenalog 40mg /dL. Patient tolerated the procedure well without immediate complications.    Impression and Recommendations:     This case required medical decision making of moderate complexity.      Note: This dictation was prepared with Dragon dictation along with smaller phrase technology. Any transcriptional errors that result from this process are unintentional.

## 2018-04-16 NOTE — Assessment & Plan Note (Signed)
Patient given a repeat injection.  Patient wants to avoid any surgical intervention.  Continue bracing at night.  Discussed possible gabapentin.  Patient will follow up with me again in 4-6 weeks

## 2018-04-21 ENCOUNTER — Ambulatory Visit: Payer: MEDICARE | Admitting: Family Medicine

## 2018-04-29 DIAGNOSIS — H2513 Age-related nuclear cataract, bilateral: Secondary | ICD-10-CM | POA: Diagnosis not present

## 2018-04-29 DIAGNOSIS — H5213 Myopia, bilateral: Secondary | ICD-10-CM | POA: Diagnosis not present

## 2018-04-29 DIAGNOSIS — H35033 Hypertensive retinopathy, bilateral: Secondary | ICD-10-CM | POA: Diagnosis not present

## 2018-04-29 DIAGNOSIS — H524 Presbyopia: Secondary | ICD-10-CM | POA: Diagnosis not present

## 2018-04-29 DIAGNOSIS — H52203 Unspecified astigmatism, bilateral: Secondary | ICD-10-CM | POA: Diagnosis not present

## 2018-05-17 NOTE — Progress Notes (Signed)
[name] [place] [place], [place] [address] Phone: [phone] Subjective:     CC: Knee pain, wrist pain follow-up  [id number]  Alicia Boyd is a 84 y.o. female coming in with complaint of knee and wrist pain. She has felt some improvement from the injection. She would like to make sure that she is ok to drive.  States her knee is feeling about 80% better.  Significantly less swelling.  Notices though some mild swelling in the lower extremity on the right side that seems to always go away though in the morning.  She is still having numbness in the thumb and 2nd and 3rd fingers. She wears the brace but is still having numbness.  No carpal tunnel.  Has not responded well to the injections.  Does not want any surgical intervention with her being the primary caregiver for her husband.     Past Medical History:  Diagnosis Date  . Aortic heart murmur    AS/AI on 2D ECHO ; SBE prophylaxis Rxed  . Chest pain 2007 ; 2011   negative cardiac assessment  . Hyperlipidemia   . Interstitial cystitis    Resolved  . Osteoporosis    T score - 3.0 @ femoral neck; Fosamax affected swallowing  . Postmenopausal    No PMH or HRT   Past Surgical History:  Procedure Laterality Date  . COLONOSCOPY  2011   Dr Sharlett Iles   Social History   Socioeconomic History  . Marital status: Married    Spouse name: Not on file  . Number of children: Not on file  . Years of education: Not on file  . Highest education level: Not on file  Occupational History  . Not on file  Social Needs  . Financial resource strain: Not on file  . Food insecurity:    Worry: Not on file    Inability: Not on file  . Transportation needs:    Medical: Not on file    Non-medical: Not on file  Tobacco Use  . Smoking status: Never Smoker  . Smokeless tobacco: Never Used  Substance and Sexual Activity  . Alcohol use: No  . Drug use: No  . Sexual activity: Not on file  Lifestyle    . Physical activity:    Days per week: Not on file    Minutes per session: Not on file  . Stress: Not on file  Relationships  . Social connections:    Talks on phone: Not on file    Gets together: Not on file    Attends religious service: Not on file    Active member of club or organization: Not on file    Attends meetings of clubs or organizations: Not on file    Relationship status: Not on file  Other Topics Concern  . Not on file  Social History Narrative  . Not on file   Allergies  Allergen Reactions  . Amitid [Amitriptyline Hcl]     Patient experienced a dizzy, spinning feeling   . Metronidazole     REACTION: throat swells  . Penicillins     REACTION: rash & fever  . Alendronate Sodium     REACTION: unspecified  . Clarithromycin     GI intolerance  . Flonase [Fluticasone Propionate]     Slight Headache   . Montelukast Other (See Comments)    unknown  . Promethazine Hcl     REACTION: unspecified  . Robitussin (Alcohol Free) [Guaifenesin]  Other (See Comments)    unknown hyperactive  . Spironolactone     09/21/13 weakness in legs  . Singulair [Montelukast Sodium]     "spacey"; ineffective   Family History  Problem Relation Age of Onset  . Diabetes Father        Pancreatitic insufficiency post MVA  . Hypertension Mother   . Osteoporosis Mother   . Heart disease Mother        AF  . Thyroid disease Sister        hypothyroidism  . Coronary artery disease Sister        AF  . Stroke Sister 18       also pulmonary disease     Past medical history, social, surgical and family history all reviewed in electronic medical record.  No pertanent information unless stated regarding to the chief complaint.   Review of Systems:Review of systems updated and as accurate as of 05/18/18  No headache, visual changes, nausea, vomiting, diarrhea, constipation, dizziness, abdominal pain, skin rash, fevers, chills, night sweats, weight loss, swollen lymph nodes, body aches,  joint swelling, muscle aches, chest pain, shortness of breath, mood changes.   Objective  Blood pressure 132/70, pulse 85, height 5\' 5"  (1.651 m), weight 115 lb (52.2 kg), SpO2 97 %. Systems examined below as of 05/18/18   General: No apparent distress alert and oriented x3 mood and affect normal, dressed appropriately.  HEENT: Pupils equal, extraocular movements intact  Respiratory: Patient's speak in full sentences and does not appear short of breath  Cardiovascular: No lower extremity edema, non tender, no erythema  Skin: Warm dry intact with no signs of infection or rash on extremities or on axial skeleton.  Abdomen: Soft nontender  Neuro: Cranial nerves II through XII are intact, neurovascularly intact in all extremities with 2+ DTRs and 2+ pulses.  Lymph: No lymphadenopathy of posterior or anterior cervical chain or axillae bilaterally.  Gait antalgic MSK:  tender with full range of motion and good stability and symmetric strength and tone of shoulders, elbows,  hip, knee and ankles bilaterally.  Arthritic changes of multiple joints Patient's skin exam shows the patient still has a positive Tinel's.  Patient does have some mild limited range of motion of the right wrist compared to the left.  Grip strength is symmetric  Left knee exam still has a trace amount of fluid.  Full range of motion with mild crepitus.  Still instability noted.  Less tender than previous exam   Impression and Recommendations:     This case required medical decision making of moderate complexity.      Note: This dictation was prepared with Dragon dictation along with smaller phrase technology. Any transcriptional errors that result from this process are unintentional.

## 2018-05-18 ENCOUNTER — Encounter: Payer: Self-pay | Admitting: Family Medicine

## 2018-05-18 ENCOUNTER — Ambulatory Visit (INDEPENDENT_AMBULATORY_CARE_PROVIDER_SITE_OTHER): Payer: MEDICARE | Admitting: Family Medicine

## 2018-05-18 DIAGNOSIS — M1712 Unilateral primary osteoarthritis, left knee: Secondary | ICD-10-CM | POA: Diagnosis not present

## 2018-05-18 DIAGNOSIS — G5601 Carpal tunnel syndrome, right upper limb: Secondary | ICD-10-CM | POA: Diagnosis not present

## 2018-05-18 NOTE — Assessment & Plan Note (Signed)
Better after the injection.  Continue conservative therapy with topical anti-inflammatories and icing regimen.  Follow-up again as needed

## 2018-05-18 NOTE — Assessment & Plan Note (Signed)
No significant improvement after the injection.  Discussed the work-up for the potential for her neck which patient declined.  We discussed that an EMG or nerve conduction study which patient declined.  Discussed possible surgical intervention which patient declined.  We discussed increasing gabapentin but is already having difficulty with the medication.  Follow-up again as needed

## 2018-05-18 NOTE — Patient Instructions (Signed)
Good to see you  Ice 20 minutes 2 times daily. Usually after activity and before bed. pennsaid pinkie amount topically 2 times daily as needed.  As long as you do well see me when you need me

## 2018-07-15 ENCOUNTER — Encounter: Payer: Self-pay | Admitting: Adult Health

## 2018-07-15 ENCOUNTER — Ambulatory Visit (INDEPENDENT_AMBULATORY_CARE_PROVIDER_SITE_OTHER): Payer: MEDICARE | Admitting: Adult Health

## 2018-07-15 ENCOUNTER — Encounter

## 2018-07-15 VITALS — BP 146/78 | Temp 98.3°F | Wt 118.0 lb

## 2018-07-15 DIAGNOSIS — I872 Venous insufficiency (chronic) (peripheral): Secondary | ICD-10-CM

## 2018-07-15 NOTE — Progress Notes (Signed)
Subjective:    Patient ID: Alicia Boyd, female    DOB: 12/18/1930, 84 y.o.   MRN: [id number]  HPI 84 year old female who  has a past medical history of Aortic heart murmur, Chest pain (2007 ; 2011), Hyperlipidemia, Interstitial cystitis, Osteoporosis, and Postmenopausal.  She presents to the office today for follow up regarding lower extremity edema. She reports that " some days my legs are swollen and some days they are now." I have done a lasix trial with her at which point she noticed some improvement but not enough to continue. Lower extremity edema becomes worse as the day goes on.   She denies any chest pain or shortness of breath.   We have discussed compression socks in the past but she never got a pair to try   Review of Systems See HPI   Past Medical History:  Diagnosis Date  . Aortic heart murmur    AS/AI on 2D ECHO ; SBE prophylaxis Rxed  . Chest pain 2007 ; 2011   negative cardiac assessment  . Hyperlipidemia   . Interstitial cystitis    Resolved  . Osteoporosis    T score - 3.0 @ femoral neck; Fosamax affected swallowing  . Postmenopausal    No PMH or HRT    Social History   Socioeconomic History  . Marital status: Married    Spouse name: Not on file  . Number of children: Not on file  . Years of education: Not on file  . Highest education level: Not on file  Occupational History  . Not on file  Social Needs  . Financial resource strain: Not on file  . Food insecurity:    Worry: Not on file    Inability: Not on file  . Transportation needs:    Medical: Not on file    Non-medical: Not on file  Tobacco Use  . Smoking status: Never Smoker  . Smokeless tobacco: Never Used  Substance and Sexual Activity  . Alcohol use: No  . Drug use: No  . Sexual activity: Not on file  Lifestyle  . Physical activity:    Days per week: Not on file    Minutes per session: Not on file  . Stress: Not on file  Relationships  . Social connections:    Talks on  phone: Not on file    Gets together: Not on file    Attends religious service: Not on file    Active member of club or organization: Not on file    Attends meetings of clubs or organizations: Not on file    Relationship status: Not on file  . Intimate partner violence:    Fear of current or ex partner: Not on file    Emotionally abused: Not on file    Physically abused: Not on file    Forced sexual activity: Not on file  Other Topics Concern  . Not on file  Social History Narrative  . Not on file    Past Surgical History:  Procedure Laterality Date  . COLONOSCOPY  2011   Dr Sharlett Iles    Family History  Problem Relation Age of Onset  . Diabetes Father        Pancreatitic insufficiency post MVA  . Hypertension Mother   . Osteoporosis Mother   . Heart disease Mother        AF  . Thyroid disease Sister        hypothyroidism  . Coronary artery disease Sister  AF  . Stroke Sister 58       also pulmonary disease    Allergies  Allergen Reactions  . Amitid [Amitriptyline Hcl]     Patient experienced a dizzy, spinning feeling   . Metronidazole     REACTION: throat swells  . Penicillins     REACTION: rash & fever  . Alendronate Sodium     REACTION: unspecified  . Clarithromycin     GI intolerance  . Flonase [Fluticasone Propionate]     Slight Headache   . Montelukast Other (See Comments)    unknown  . Promethazine Hcl     REACTION: unspecified  . Robitussin (Alcohol Free) [Guaifenesin] Other (See Comments)    unknown hyperactive  . Spironolactone     09/21/13 weakness in legs  . Singulair [Montelukast Sodium]     "spacey"; ineffective    Current Outpatient Medications on File Prior to Visit  Medication Sig Dispense Refill  . amLODipine (NORVASC) 5 MG tablet TAKE 1 TABLET BY MOUTH EVERY DAY 90 tablet 1  . Calcium Carbonate (CALCIUM 600 PO) Take by mouth daily.    . Cholecalciferol (VITAMIN D3) 1000 UNITS CAPS Take by mouth daily.    . fexofenadine  (ALLEGRA) 180 MG tablet Take 180 mg by mouth daily as needed.     . gabapentin (NEURONTIN) 100 MG capsule Take 1 capsule (100 mg total) by mouth at bedtime. 30 capsule 3  . ipratropium (ATROVENT) 0.03 % nasal spray USE 2 SPRAYS IN EACH NOSTRIL EVERY 12 HOURS. DO NOT USE MORE THAN 5 DAYS 60 mL 11  . metoprolol succinate (TOPROL-XL) 25 MG 24 hr tablet TAKE 1 TABLET BY MOUTH EVERY DAY 90 tablet 1  . Multiple Vitamin (MULTIVITAMIN) capsule Take 1 capsule by mouth daily.      Vladimir Faster Glycol-Propyl Glycol (SYSTANE OP) Apply to eye.     No current facility-administered medications on file prior to visit.     BP (!) 146/78   Temp 98.3 F (36.8 C) (Oral)   Wt 118 lb (53.5 kg)   BMI 19.64 kg/m       Objective:   Physical Exam  Constitutional: She is oriented to person, place, and time. She appears well-developed and well-nourished. No distress.  Cardiovascular: Normal rate and regular rhythm.  Pulmonary/Chest: Effort normal and breath sounds normal.  Musculoskeletal: Normal range of motion. She exhibits no edema, tenderness or deformity.  No lower extremity edema noted today   Neurological: She is alert and oriented to person, place, and time.  Skin: She is not diaphoretic.  Nursing note and vitals reviewed.     Assessment & Plan:  1. Venous (peripheral) insufficiency - She refuses referral to vein and vascular. She was educated on venous insufficiency. Sh does not want any additional medications at this time. Encouraged low sodium diet and to wear compression socks  - Follow up as needed  [name], NP

## 2018-09-22 NOTE — Progress Notes (Signed)
Subjective:   Alicia Boyd is a 84 y.o. female who presents for Medicare Annual (Subsequent) preventive examination.  Reports health as fair; appears more stressed today  PMH: postmenopausal Osteoporosis/ no fractures/ takes calcium per day; Centrum silver and Vit D; declines further dexa scans; she is not going to take medicine / declines today  Walks and takes calicium No recent falls  Hyperlipidemia/ lipid panel ratio 3; in 2014  Has got on the list at [facility]  Diet Takes natural alternatives probiotic  Wife cooks Basic nutrients;  Oatmeal and toast for breakfast [place] sandwich and fruit Fish; and vegetable    Exercise;  They walk a lot together Has a church family   HOME SAFETY Long term plan reviewed  Home: level; barriers; or needs identified as bathroom railing up now Tub to get in for shower; they have bars and this helps Bedrooms on the 2nd floor but this Is no problem for them at this time.  Fall hx; no   Dermatologist Dr. Syble Boyd    There are no preventive care reminders to display for this patient. Postpone flu vaccine due being out Will come back Friday but flu vaccines came in this afternoon. Flu vaccine was given by Team Lead   Cardiac Risk Factors include: advanced age (>64men, >72 women);family history of premature cardiovascular disease;hypertensioneducated regarding the shingrix   Hx of osteoporosis with no fracture   Educated regarding shingrix     Objective:     Vitals: BP (!) 158/80   Pulse 84   Ht 5\' 5"  (1.651 m)   Wt 116 lb 4 oz (52.7 kg)   SpO2 98%   BMI 19.35 kg/m   Body mass index is 19.35 kg/m.  Advanced Directives 09/23/2018 02/04/2018 09/19/2017 07/16/2017 08/20/2016  Does Patient Have a Medical Advance Directive? Yes No Yes Yes Yes  Type of Advance Directive - - Public librarian;Living will -  Does patient want to make changes to medical advance directive? - - - No - Patient declined -  Copy of  [place] in Chart? - - - No - copy requested -  Would patient like information on creating a medical advance directive? - No - Patient declined - No - Patient declined -    Tobacco Social History   Tobacco Use  Smoking Status Never Smoker  Smokeless Tobacco Never Used     Counseling given: Yes   Clinical Intake:   Past Medical History:  Diagnosis Date  . Aortic heart murmur    AS/AI on 2D ECHO ; SBE prophylaxis Rxed  . Chest pain 2007 ; 2011   negative cardiac assessment  . Hyperlipidemia   . Interstitial cystitis    Resolved  . Osteoporosis    T score - 3.0 @ femoral neck; Fosamax affected swallowing  . Postmenopausal    No PMH or HRT   Past Surgical History:  Procedure Laterality Date  . COLONOSCOPY  2011   Dr Alicia Boyd   Family History  Problem Relation Age of Onset  . Diabetes Father        Pancreatitic insufficiency post MVA  . Hypertension Mother   . Osteoporosis Mother   . Heart disease Mother        AF  . Thyroid disease Sister        hypothyroidism  . Coronary artery disease Sister        AF  . Stroke Sister 18  also pulmonary disease   Social History   Socioeconomic History  . Marital status: Married    Spouse name: Not on file  . Number of children: Not on file  . Years of education: Not on file  . Highest education level: Not on file  Occupational History  . Not on file  Social Needs  . Financial resource strain: Not on file  . Food insecurity:    Worry: Not on file    Inability: Not on file  . Transportation needs:    Medical: Not on file    Non-medical: Not on file  Tobacco Use  . Smoking status: Never Smoker  . Smokeless tobacco: Never Used  Substance and Sexual Activity  . Alcohol use: No  . Drug use: No  . Sexual activity: Not on file  Lifestyle  . Physical activity:    Days per week: Not on file    Minutes per session: Not on file  . Stress: Not on file  Relationships  . Social connections:     Talks on phone: Not on file    Gets together: Not on file    Attends religious service: Not on file    Active member of club or organization: Not on file    Attends meetings of clubs or organizations: Not on file    Relationship status: Not on file  Other Topics Concern  . Not on file  Social History Narrative  . Not on file    Outpatient Encounter Medications as of 09/23/2018  Medication Sig  . amLODipine (NORVASC) 5 MG tablet TAKE 1 TABLET BY MOUTH EVERY DAY  . Calcium Carbonate (CALCIUM 600 PO) Take by mouth daily.  . Cholecalciferol (VITAMIN D3) 1000 UNITS CAPS Take by mouth daily.  . fexofenadine (ALLEGRA) 180 MG tablet Take 180 mg by mouth daily as needed.   . gabapentin (NEURONTIN) 100 MG capsule Take 1 capsule (100 mg total) by mouth at bedtime.  Marland Kitchen ipratropium (ATROVENT) 0.03 % nasal spray USE 2 SPRAYS IN EACH NOSTRIL EVERY 12 HOURS. DO NOT USE MORE THAN 5 DAYS  . metoprolol succinate (TOPROL-XL) 25 MG 24 hr tablet TAKE 1 TABLET BY MOUTH EVERY DAY  . Multiple Vitamin (MULTIVITAMIN) capsule Take 1 capsule by mouth daily.    Alicia Boyd Glycol-Propyl Glycol (SYSTANE OP) Apply to eye.   No facility-administered encounter medications on file as of 09/23/2018.     Activities of Daily Living In your present state of health, do you have any difficulty performing the following activities: 09/23/2018  Hearing? N  Vision? N  Difficulty concentrating or making decisions? N  Walking or climbing stairs? N  Dressing or bathing? N  Doing errands, shopping? N  Preparing Food and eating ? N  Using the Toilet? N  In the past six months, have you accidently leaked urine? N  Do you have problems with loss of bowel control? N  Managing your Medications? N  Managing your Finances? N  Housekeeping or managing your Housekeeping? N  Some recent data might be hidden    Patient Care Team: Alicia Peng, NP as PCP - General (Family Medicine)    Assessment:   This is a routine wellness  examination for [name].  Exercise Activities and Dietary recommendations Current Exercise Habits: Home exercise routine, Intensity: Mild  Goals    . patient     Would like to walk more but is dependent on spouse     . Patient Stated     Being  able to work in the home Will continue to go to Dr. Tamala Julian        Fall Risk Fall Risk  09/23/2018 09/19/2017 09/04/2016 08/20/2016 12/20/2014  Falls in the past year? No No No No No  Comment - - Emmi Telephone Survey: data to providers prior to load - -     Depression Screen PHQ 2/9 Scores 09/23/2018 09/19/2017 08/20/2016 12/20/2014  PHQ - 2 Score 0 0 0 0     Cognitive Function MMSE - Mini Mental State Exam 09/23/2018 09/19/2017 08/20/2016  Not completed: (No Data) (No Data) (No Data)     Ad8 score reviewed for issues:  Issues making decisions:  Less interest in hobbies / activities:  Repeats questions, stories (family complaining):  Trouble using ordinary gadgets (microwave, computer, phone):  Forgets the month or year:   Mismanaging finances:   Remembering appts:  Daily problems with thinking and/or memory: Ad8 score is=0       Immunization History  Administered Date(s) Administered  . Influenza Split 12/10/2011, 09/29/2012  . Influenza, High Dose Seasonal PF 09/21/2014, 08/27/2016, 08/27/2017, 09/23/2018  . Influenza,inj,Quad PF,6+ Mos 10/08/2013, 09/12/2015  . Pneumococcal Conjugate-13 08/22/2015  . Pneumococcal Polysaccharide-23 11/23/2008  . Td 03/23/2004  . Tdap 07/12/2015    Screening Tests Health Maintenance  Topic Date Due  . DEXA SCAN  09/28/2018 (Originally 10/31/1997)  . TETANUS/TDAP  07/11/2025  . INFLUENZA VACCINE  Completed  . PNA vac Low Risk Adult  Completed        Plan:      PCP Notes   Health Maintenance Postpone flu vaccine due being out Will come back Friday but flu vaccines came in this afternoon. Flu vaccine was given by Team Lead    educated regarding the shingrix   Hx of  osteoporosis with no fracture   Educated regarding shingrix       Abnormal Screens  none  Referrals  Wife is doing well, very mobile, feeling a little overwhelmed with care giving responsibilities. Still has responsive church family   Patient concerns; Discussed long term care. Did states she put them on a waiting list at [facility] but no plans to move soon.  Will be seeing Dr. Tamala Julian soon for wrist, her knee is better   Nurse Concerns; PMH: postmenopausal Osteoporosis/ no fractures/ takes calcium per day; Centrum silver and Vit D; declines further dexa scans; she is not going to take medicine / declines today    Next PCP apt Recent apt; to schedule       I have personally reviewed and noted the following in the patient's chart:   . Medical and social history . Use of alcohol, tobacco or illicit drugs  . Current medications and supplements . Functional ability and status . Nutritional status . Physical activity . Advanced directives . List of other physicians . Hospitalizations, surgeries, and ER visits in previous 12 months . Vitals . Screenings to include cognitive, depression, and falls . Referrals and appointments  In addition, I have reviewed and discussed with patient certain preventive protocols, quality metrics, and best practice recommendations. A written personalized care plan for preventive services as well as general preventive health recommendations were provided to patient.     Wynetta Fines, RN  09/23/2018

## 2018-09-23 ENCOUNTER — Ambulatory Visit (INDEPENDENT_AMBULATORY_CARE_PROVIDER_SITE_OTHER): Payer: MEDICARE

## 2018-09-23 VITALS — BP 158/80 | HR 84 | Ht 65.0 in | Wt 116.2 lb

## 2018-09-23 DIAGNOSIS — Z0000 Encounter for general adult medical examination without abnormal findings: Secondary | ICD-10-CM | POA: Diagnosis not present

## 2018-09-23 DIAGNOSIS — Z23 Encounter for immunization: Secondary | ICD-10-CM

## 2018-09-23 NOTE — Progress Notes (Signed)
I have reviewed documentation for AWV and Advance Care Planning provided by the health coach and agree with documentation. I was immediately available for questions.  

## 2018-09-23 NOTE — Patient Instructions (Addendum)
Alicia Boyd , Thank you for taking time to come for your Medicare Wellness Visit. I appreciate your ongoing commitment to your health goals. Please review the following plan we discussed and let me know if I can assist you in the future.   Will schedule your flu vaccine for Friday with a nurse visit  These are the goals we discussed: Goals    . patient     Would like to walk more but is dependent on spouse        This is a list of the screening recommended for you and due dates:  Health Maintenance  Topic Date Due  . Flu Shot  07/30/2018  . DEXA scan (bone density measurement)  09/28/2018*  . Tetanus Vaccine  07/11/2025  . Pneumonia vaccines  Completed  *Topic was postponed. The date shown is not the original due date.    '  Fall Prevention in the [place] can cause injuries. They can happen to people of all ages. There are many things you can do to make your home safe and to help prevent falls. What can I do on the outside of my home?  Regularly fix the edges of walkways and driveways and fix any cracks.  Remove anything that might make you trip as you walk through a door, such as a raised step or threshold.  Trim any bushes or trees on the path to your home.  Use bright outdoor lighting.  Clear any walking paths of anything that might make someone trip, such as rocks or tools.  Regularly check to see if handrails are loose or broken. Make sure that both sides of any steps have handrails.  Any raised decks and porches should have guardrails on the edges.  Have any leaves, snow, or ice cleared regularly.  Use sand or salt on walking paths during winter.  Clean up any spills in your garage right away. This includes oil or grease spills. What can I do in the bathroom?  Use night lights.  Install grab bars by the toilet and in the tub and shower. Do not use towel bars as grab bars.  Use non-skid mats or decals in the tub or shower.  If you need to sit down in the  shower, use a plastic, non-slip stool.  Keep the floor dry. Clean up any water that spills on the floor as soon as it happens.  Remove soap buildup in the tub or shower regularly.  Attach bath mats securely with double-sided non-slip rug tape.  Do not have throw rugs and other things on the floor that can make you trip. What can I do in the bedroom?  Use night lights.  Make sure that you have a light by your bed that is easy to reach.  Do not use any sheets or blankets that are too big for your bed. They should not hang down onto the floor.  Have a firm chair that has side arms. You can use this for support while you get dressed.  Do not have throw rugs and other things on the floor that can make you trip. What can I do in the kitchen?  Clean up any spills right away.  Avoid walking on wet floors.  Keep items that you use a lot in easy-to-reach places.  If you need to reach something above you, use a strong step stool that has a grab bar.  Keep electrical cords out of the way.  Do not use floor polish  or wax that makes floors slippery. If you must use wax, use non-skid floor wax.  Do not have throw rugs and other things on the floor that can make you trip. What can I do with my stairs?  Do not leave any items on the stairs.  Make sure that there are handrails on both sides of the stairs and use them. Fix handrails that are broken or loose. Make sure that handrails are as long as the stairways.  Check any carpeting to make sure that it is firmly attached to the stairs. Fix any carpet that is loose or worn.  Avoid having throw rugs at the top or bottom of the stairs. If you do have throw rugs, attach them to the floor with carpet tape.  Make sure that you have a light switch at the top of the stairs and the bottom of the stairs. If you do not have them, ask someone to add them for you. What else can I do to help prevent falls?  Wear shoes that: ? Do not have high  heels. ? Have rubber bottoms. ? Are comfortable and fit you well. ? Are closed at the toe. Do not wear sandals.  If you use a stepladder: ? Make sure that it is fully opened. Do not climb a closed stepladder. ? Make sure that both sides of the stepladder are locked into place. ? Ask someone to hold it for you, if possible.  Clearly mark and make sure that you can see: ? Any grab bars or handrails. ? First and last steps. ? Where the edge of each step is.  Use tools that help you move around (mobility aids) if they are needed. These include: ? Canes. ? Walkers. ? Scooters. ? Crutches.  Turn on the lights when you go into a dark area. Replace any light bulbs as soon as they burn out.  Set up your furniture so you have a clear path. Avoid moving your furniture around.  If any of your floors are uneven, fix them.  If there are any pets around you, be aware of where they are.  Review your medicines with your doctor. Some medicines can make you feel dizzy. This can increase your chance of falling. Ask your doctor what other things that you can do to help prevent falls. This information is not intended to replace advice given to you by your health care provider. Make sure you discuss any questions you have with your health care provider. Document Released: 10/12/2009 Document Revised: 05/23/2016 Document Reviewed: 01/20/2015 Elsevier Interactive Patient Education  2018 [place] Maintenance, Female Adopting a healthy lifestyle and getting preventive care can go a long way to promote health and wellness. Talk with your health care provider about what schedule of regular examinations is right for you. This is a good chance for you to check in with your provider about disease prevention and staying healthy. In between checkups, there are plenty of things you can do on your own. Experts have done a lot of research about which lifestyle changes and preventive measures are most  likely to keep you healthy. Ask your health care provider for more information. Weight and diet Eat a healthy diet  Be sure to include plenty of vegetables, fruits, low-fat dairy products, and lean protein.  Do not eat a lot of foods high in solid fats, added sugars, or salt.  Get regular exercise. This is one of the most important things you can do for  your health. ? Most adults should exercise for at least 150 minutes each week. The exercise should increase your heart rate and make you sweat (moderate-intensity exercise). ? Most adults should also do strengthening exercises at least twice a week. This is in addition to the moderate-intensity exercise.  Maintain a healthy weight  Body mass index (BMI) is a measurement that can be used to identify possible weight problems. It estimates body fat based on height and weight. Your health care provider can help determine your BMI and help you achieve or maintain a healthy weight.  For females 71 years of age and older: ? A BMI below 18.5 is considered underweight. ? A BMI of 18.5 to 24.9 is normal. ? A BMI of 25 to 29.9 is considered overweight. ? A BMI of 30 and above is considered obese.  Watch levels of cholesterol and blood lipids  You should start having your blood tested for lipids and cholesterol at 82 years of age, then have this test every 5 years.  You may need to have your cholesterol levels checked more often if: ? Your lipid or cholesterol levels are high. ? You are older than 82 years of age. ? You are at high risk for heart disease.  Cancer screening Lung Cancer  Lung cancer screening is recommended for adults 34-64 years old who are at high risk for lung cancer because of a history of smoking.  A yearly low-dose CT scan of the lungs is recommended for people who: ? Currently smoke. ? Have quit within the past 15 years. ? Have at least a 30-pack-year history of smoking. A pack year is smoking an average of one pack of  cigarettes a day for 1 year.  Yearly screening should continue until it has been 15 years since you quit.  Yearly screening should stop if you develop a health problem that would prevent you from having lung cancer treatment.  Breast Cancer  Practice breast self-awareness. This means understanding how your breasts normally appear and feel.  It also means doing regular breast self-exams. Let your health care provider know about any changes, no matter how small.  If you are in your 20s or 30s, you should have a clinical breast exam (CBE) by a health care provider every 1-3 years as part of a regular health exam.  If you are 81 or older, have a CBE every year. Also consider having a breast X-ray (mammogram) every year.  If you have a family history of breast cancer, talk to your health care provider about genetic screening.  If you are at high risk for breast cancer, talk to your health care provider about having an MRI and a mammogram every year.  Breast cancer gene (BRCA) assessment is recommended for women who have family members with BRCA-related cancers. BRCA-related cancers include: ? Breast. ? Ovarian. ? Tubal. ? Peritoneal cancers.  Results of the assessment will determine the need for genetic counseling and BRCA1 and BRCA2 testing.  Cervical Cancer Your health care provider may recommend that you be screened regularly for cancer of the pelvic organs (ovaries, uterus, and vagina). This screening involves a pelvic examination, including checking for microscopic changes to the surface of your cervix (Pap test). You may be encouraged to have this screening done every 3 years, beginning at age 62.  For women ages 52-65, health care providers may recommend pelvic exams and Pap testing every 3 years, or they may recommend the Pap and pelvic exam, combined with testing  for human papilloma virus (HPV), every 5 years. Some types of HPV increase your risk of cervical cancer. Testing for HPV  may also be done on women of any age with unclear Pap test results.  Other health care providers may not recommend any screening for nonpregnant women who are considered low risk for pelvic cancer and who do not have symptoms. Ask your health care provider if a screening pelvic exam is right for you.  If you have had past treatment for cervical cancer or a condition that could lead to cancer, you need Pap tests and screening for cancer for at least 20 years after your treatment. If Pap tests have been discontinued, your risk factors (such as having a new sexual partner) need to be reassessed to determine if screening should resume. Some women have medical problems that increase the chance of getting cervical cancer. In these cases, your health care provider may recommend more frequent screening and Pap tests.  Colorectal Cancer  This type of cancer can be detected and often prevented.  Routine colorectal cancer screening usually begins at 82 years of age and continues through 82 years of age.  Your health care provider may recommend screening at an earlier age if you have risk factors for colon cancer.  Your health care provider may also recommend using home test kits to check for hidden blood in the stool.  A small camera at the end of a tube can be used to examine your colon directly (sigmoidoscopy or colonoscopy). This is done to check for the earliest forms of colorectal cancer.  Routine screening usually begins at age 23.  Direct examination of the colon should be repeated every 5-10 years through 82 years of age. However, you may need to be screened more often if early forms of precancerous polyps or small growths are found.  Skin Cancer  Check your skin from head to toe regularly.  Tell your health care provider about any new moles or changes in moles, especially if there is a change in a mole's shape or color.  Also tell your health care provider if you have a mole that is larger  than the size of a pencil eraser.  Always use sunscreen. Apply sunscreen liberally and repeatedly throughout the day.  Protect yourself by wearing long sleeves, pants, a wide-brimmed hat, and sunglasses whenever you are outside.  Heart disease, diabetes, and high blood pressure  High blood pressure causes heart disease and increases the risk of stroke. High blood pressure is more likely to develop in: ? People who have blood pressure in the high end of the normal range (130-139/85-89 mm Hg). ? People who are overweight or obese. ? People who are African American.  If you are 26-7 years of age, have your blood pressure checked every 3-5 years. If you are 16 years of age or older, have your blood pressure checked every year. You should have your blood pressure measured twice-once when you are at a hospital or clinic, and once when you are not at a hospital or clinic. Record the average of the two measurements. To check your blood pressure when you are not at a hospital or clinic, you can use: ? An automated blood pressure machine at a pharmacy. ? A home blood pressure monitor.  If you are between 34 years and 28 years old, ask your health care provider if you should take aspirin to prevent strokes.  Have regular diabetes screenings. This involves taking a blood sample to  check your fasting blood sugar level. ? If you are at a normal weight and have a low risk for diabetes, have this test once every three years after 82 years of age. ? If you are overweight and have a high risk for diabetes, consider being tested at a younger age or more often. Preventing infection Hepatitis B  If you have a higher risk for hepatitis B, you should be screened for this virus. You are considered at high risk for hepatitis B if: ? You were born in a country where hepatitis B is common. Ask your health care provider which countries are considered high risk. ? Your parents were born in a high-risk country, and  you have not been immunized against hepatitis B (hepatitis B vaccine). ? You have HIV or AIDS. ? You use needles to inject street drugs. ? You live with someone who has hepatitis B. ? You have had sex with someone who has hepatitis B. ? You get hemodialysis treatment. ? You take certain medicines for conditions, including cancer, organ transplantation, and autoimmune conditions.  Hepatitis C  Blood testing is recommended for: ? Everyone born from [birth year] through 1963. ? Anyone with known risk factors for hepatitis C.  Sexually transmitted infections (STIs)  You should be screened for sexually transmitted infections (STIs) including gonorrhea and chlamydia if: ? You are sexually active and are younger than 82 years of age. ? You are older than 82 years of age and your health care provider tells you that you are at risk for this type of infection. ? Your sexual activity has changed since you were last screened and you are at an increased risk for chlamydia or gonorrhea. Ask your health care provider if you are at risk.  If you do not have HIV, but are at risk, it may be recommended that you take a prescription medicine daily to prevent HIV infection. This is called pre-exposure prophylaxis (PrEP). You are considered at risk if: ? You are sexually active and do not regularly use condoms or know the HIV status of your partner(s). ? You take drugs by injection. ? You are sexually active with a partner who has HIV.  Talk with your health care provider about whether you are at high risk of being infected with HIV. If you choose to begin PrEP, you should first be tested for HIV. You should then be tested every 3 months for as long as you are taking PrEP. Pregnancy  If you are premenopausal and you may become pregnant, ask your health care provider about preconception counseling.  If you may become pregnant, take 400 to 800 micrograms (mcg) of folic acid every day.  If you want to prevent  pregnancy, talk to your health care provider about birth control (contraception). Osteoporosis and menopause  Osteoporosis is a disease in which the bones lose minerals and strength with aging. This can result in serious bone fractures. Your risk for osteoporosis can be identified using a bone density scan.  If you are 24 years of age or older, or if you are at risk for osteoporosis and fractures, ask your health care provider if you should be screened.  Ask your health care provider whether you should take a calcium or vitamin D supplement to lower your risk for osteoporosis.  Menopause may have certain physical symptoms and risks.  Hormone replacement therapy may reduce some of these symptoms and risks. Talk to your health care provider about whether hormone replacement therapy is right for  you. Follow these instructions at home:  Schedule regular health, dental, and eye exams.  Stay current with your immunizations.  Do not use any tobacco products including cigarettes, chewing tobacco, or electronic cigarettes.  If you are pregnant, do not drink alcohol.  If you are breastfeeding, limit how much and how often you drink alcohol.  Limit alcohol intake to no more than 1 drink per day for nonpregnant women. One drink equals 12 ounces of beer, 5 ounces of wine, or 1 ounces of hard liquor.  Do not use street drugs.  Do not share needles.  Ask your health care provider for help if you need support or information about quitting drugs.  Tell your health care provider if you often feel depressed.  Tell your health care provider if you have ever been abused or do not feel safe at home. This information is not intended to replace advice given to you by your health care provider. Make sure you discuss any questions you have with your health care provider. Document Released: 07/01/2011 Document Revised: 05/23/2016 Document Reviewed: 09/19/2015 Elsevier Interactive Patient Education  2018  [employer].   Hearing Loss Hearing loss is a partial or total loss of the ability to hear. This can be temporary or permanent, and it can happen in one or both ears. Hearing loss may be referred to as deafness. Medical care is necessary to treat hearing loss properly and to prevent the condition from getting worse. Your hearing may partially or completely come back, depending on what caused your hearing loss and how severe it is. In some cases, hearing loss is permanent. What are the causes? Common causes of hearing loss include:  Too much wax in the ear canal.  Infection of the ear canal or middle ear.  Fluid in the middle ear.  Injury to the ear or surrounding area.  An object stuck in the ear.  Prolonged exposure to loud sounds, such as music.  Less common causes of hearing loss include:  Tumors in the ear.  Viral or bacterial infections, such as meningitis.  A hole in the eardrum (perforated eardrum).  Problems with the hearing nerve that sends signals between the brain and the ear.  Certain medicines.  What are the signs or symptoms? Symptoms of this condition may include:  Difficulty telling the difference between sounds.  Difficulty following a conversation when there is background noise.  Lack of response to sounds in your environment. This may be most noticeable when you do not respond to startling sounds.  Needing to turn up the volume on the television, radio, etc.  Ringing in the ears.  Dizziness.  Pain in the ears.  How is this diagnosed? This condition is diagnosed based on a physical exam and a hearing test (audiometry). The audiometry test will be performed by a hearing specialist (audiologist). You may also be referred to an ear, nose, and throat (ENT) specialist (otolaryngologist). How is this treated? Treatment for recent onset of hearing loss may include:  Ear wax removal.  Being prescribed medicines to prevent infection  (antibiotics).  Being prescribed medicines to reduce inflammation (corticosteroids).  Follow these instructions at home:  If you were prescribed an antibiotic medicine, take it as told by your health care provider. Do not stop taking the antibiotic even if you start to feel better.  Take over-the-counter and prescription medicines only as told by your health care provider.  Avoid loud noises.  Return to your normal activities as told by your  health care provider. Ask your health care provider what activities are safe for you.  Keep all follow-up visits as told by your health care provider. This is important. Contact a health care provider if:  You feel dizzy.  You develop new symptoms.  You vomit or feel nauseous.  You have a fever. Get help right away if:  You develop sudden changes in your vision.  You have severe ear pain.  You have new or increased weakness.  You have a severe headache. This information is not intended to replace advice given to you by your health care provider. Make sure you discuss any questions you have with your health care provider. Document Released: 12/16/2005 Document Revised: 05/23/2016 Document Reviewed: 05/03/2015 Elsevier Interactive Patient Education  2018 [employer].

## 2018-09-28 ENCOUNTER — Other Ambulatory Visit: Payer: Self-pay | Admitting: Adult Health

## 2018-09-29 NOTE — Telephone Encounter (Signed)
Ok to refill for 90 days  

## 2018-09-29 NOTE — Telephone Encounter (Signed)
Pt has not had a recent CPX.  Last BMP 02/2018.

## 2018-09-29 NOTE — Telephone Encounter (Signed)
Sent to the pharmacy by e-scribe as instructed. 

## 2018-10-05 NOTE — Progress Notes (Signed)
[facility] [address] [place], [place] [address] Phone: [phone] Subjective:    I Alicia Boyd am serving as a Education administrator for Dr. Hulan Saas.    CC: right hand pain   [id number]  Alicia Boyd is a 84 y.o. female coming in with complaint of hand pain. Hand is painful. Last injection didn't do too well. Hand is numb.  Patient has had carpal tunnel multiple times.  Last injection was April 16, 2018      Past Medical History:  Diagnosis Date  . Aortic heart murmur    AS/AI on 2D ECHO ; SBE prophylaxis Rxed  . Chest pain 2007 ; 2011   negative cardiac assessment  . Hyperlipidemia   . Interstitial cystitis    Resolved  . Osteoporosis    T score - 3.0 @ femoral neck; Fosamax affected swallowing  . Postmenopausal    No PMH or HRT   Past Surgical History:  Procedure Laterality Date  . COLONOSCOPY  2011   Dr Sharlett Iles   Social History   Socioeconomic History  . Marital status: Married    Spouse name: Not on file  . Number of children: Not on file  . Years of education: Not on file  . Highest education level: Not on file  Occupational History  . Not on file  Social Needs  . Financial resource strain: Not on file  . Food insecurity:    Worry: Not on file    Inability: Not on file  . Transportation needs:    Medical: Not on file    Non-medical: Not on file  Tobacco Use  . Smoking status: Never Smoker  . Smokeless tobacco: Never Used  Substance and Sexual Activity  . Alcohol use: No  . Drug use: No  . Sexual activity: Not on file  Lifestyle  . Physical activity:    Days per week: Not on file    Minutes per session: Not on file  . Stress: Not on file  Relationships  . Social connections:    Talks on phone: Not on file    Gets together: Not on file    Attends religious service: Not on file    Active member of club or organization: Not on file    Attends meetings of clubs or organizations: Not on file    Relationship  status: Not on file  Other Topics Concern  . Not on file  Social History Narrative  . Not on file   Allergies  Allergen Reactions  . Amitid [Amitriptyline Hcl]     Patient experienced a dizzy, spinning feeling   . Metronidazole     REACTION: throat swells  . Penicillins     REACTION: rash & fever  . Alendronate Sodium     REACTION: unspecified  . Clarithromycin     GI intolerance  . Flonase [Fluticasone Propionate]     Slight Headache   . Montelukast Other (See Comments)    unknown  . Promethazine Hcl     REACTION: unspecified  . Robitussin (Alcohol Free) [Guaifenesin] Other (See Comments)    unknown hyperactive  . Spironolactone     09/21/13 weakness in legs  . Singulair [Montelukast Sodium]     "spacey"; ineffective   Family History  Problem Relation Age of Onset  . Diabetes Father        Pancreatitic insufficiency post MVA  . Hypertension Mother   . Osteoporosis Mother   . Heart disease Mother  AF  . Thyroid disease Sister        hypothyroidism  . Coronary artery disease Sister        AF  . Stroke Sister 72       also pulmonary disease     Current Outpatient Medications (Cardiovascular):  .  amLODipine (NORVASC) 5 MG tablet, TAKE 1 TABLET BY MOUTH EVERY DAY .  metoprolol succinate (TOPROL-XL) 25 MG 24 hr tablet, TAKE 1 TABLET BY MOUTH EVERY DAY  Current Outpatient Medications (Respiratory):  .  fexofenadine (ALLEGRA) 180 MG tablet, Take 180 mg by mouth daily as needed.  Marland Kitchen  ipratropium (ATROVENT) 0.03 % nasal spray, USE 2 SPRAYS IN EACH NOSTRIL EVERY 12 HOURS. DO NOT USE MORE THAN 5 DAYS    Current Outpatient Medications (Other):  Marland Kitchen  Calcium Carbonate (CALCIUM 600 PO), Take by mouth daily. .  Cholecalciferol (VITAMIN D3) 1000 UNITS CAPS, Take by mouth daily. Marland Kitchen  gabapentin (NEURONTIN) 100 MG capsule, Take 1 capsule (100 mg total) by mouth at bedtime. .  Multiple Vitamin (MULTIVITAMIN) capsule, Take 1 capsule by mouth daily.   Vladimir Faster  Glycol-Propyl Glycol (SYSTANE OP), Apply to eye.    Past medical history, social, surgical and family history all reviewed in electronic medical record.  No pertanent information unless stated regarding to the chief complaint.   Review of Systems:  No headache, visual changes, nausea, vomiting, diarrhea, constipation, dizziness, abdominal pain, skin rash, fevers, chills, night sweats, weight loss, swollen lymph nodes, body aches, joint swelling, muscle aches, chest pain, shortness of breath, mood changes.  Positive muscle aches  Objective  Blood pressure (!) 160/80, pulse 76, height 5\' 5"  (1.651 m), weight 118 lb (53.5 kg), SpO2 97 %.    General: No apparent distress alert and oriented x3 mood and affect normal, dressed appropriately.  HEENT: Pupils equal, extraocular movements intact  Respiratory: Patient's speak in full sentences and does not appear short of breath  Cardiovascular: No lower extremity edema, non tender, no erythema  Skin: Warm dry intact with no signs of infection or rash on extremities or on axial skeleton.  Abdomen: Soft nontender  Neuro: Cranial nerves II through XII are intact, neurovascularly intact in all extremities with 2+ DTRs and 2+ pulses.  Lymph: No lymphadenopathy of posterior or anterior cervical chain or axillae bilaterally.  Gait antalgic MSK:  tender with limited range of motion and good stability and symmetric strength and tone of shoulders, elbows, , hip, knee and ankles bilaterally.  Hand exam significant arthritic changes.  Patient does have a positive Tinel's patient does have limited range of motion lacking last 5 degrees.  Grip strength is 4 out of 5 but symmetric  Procedure: Real-time Ultrasound Guided Injection of right carpal tunnel Device: GE Logiq Q7  Ultrasound guided injection is preferred based studies that show increased duration, increased effect, greater accuracy, decreased procedural pain, increased response rate with ultrasound guided  versus blind injection.  Verbal informed consent obtained.  Time-out conducted.  Noted no overlying erythema, induration, or other signs of local infection.  Skin prepped in a sterile fashion.  Local anesthesia: Topical Ethyl chloride.  With sterile technique and under real time ultrasound guidance:  median nerve visualized.  23g 5/8 inch needle inserted distal to proximal approach into nerve sheath. Pictures taken nfor needle placement. Patient did have injection of 2 cc of 1% lidocaine, 1 cc of 0.5% Marcaine, and 1 cc of Kenalog 40 mg/dL. Completed without difficulty  Pain immediately resolved suggesting accurate  placement of the medication.  Advised to call if fevers/chills, erythema, induration, drainage, or persistent bleeding.  Images permanently stored and available for review in the ultrasound unit.  Impression: Technically successful ultrasound guided injection.    Impression and Recommendations:     This case required medical decision making of moderate complexity. The above documentation has been reviewed and is accurate and complete Lyndal Pulley, DO       Note: This dictation was prepared with Dragon dictation along with smaller phrase technology. Any transcriptional errors that result from this process are unintentional.

## 2018-10-06 ENCOUNTER — Ambulatory Visit (INDEPENDENT_AMBULATORY_CARE_PROVIDER_SITE_OTHER): Payer: MEDICARE | Admitting: Family Medicine

## 2018-10-06 ENCOUNTER — Ambulatory Visit: Payer: Self-pay

## 2018-10-06 ENCOUNTER — Encounter: Payer: Self-pay | Admitting: Family Medicine

## 2018-10-06 VITALS — BP 160/80 | HR 76 | Ht 65.0 in | Wt 118.0 lb

## 2018-10-06 DIAGNOSIS — G5601 Carpal tunnel syndrome, right upper limb: Secondary | ICD-10-CM

## 2018-10-06 DIAGNOSIS — M79641 Pain in right hand: Secondary | ICD-10-CM

## 2018-10-06 NOTE — Patient Instructions (Signed)
Good to see you  Ice is yoru friend I am hoping this injection helps Brace at night Stay active See me again in 10 weeks

## 2018-10-06 NOTE — Assessment & Plan Note (Signed)
Patient given injection.  Discussed icing regimen and home exercise.  Discussed which activities to do which wants to avoid.  Discussed continuing gabapentin at a low dose but could increase if patient can tolerate.

## 2018-11-20 ENCOUNTER — Ambulatory Visit: Payer: MEDICARE | Admitting: Family Medicine

## 2018-11-20 ENCOUNTER — Encounter: Payer: Self-pay | Admitting: Family Medicine

## 2018-11-20 ENCOUNTER — Ambulatory Visit (INDEPENDENT_AMBULATORY_CARE_PROVIDER_SITE_OTHER): Payer: MEDICARE | Admitting: Family Medicine

## 2018-11-20 VITALS — BP 160/80 | HR 86 | Temp 98.2°F | Ht 65.0 in | Wt 114.5 lb

## 2018-11-20 DIAGNOSIS — M7061 Trochanteric bursitis, right hip: Secondary | ICD-10-CM | POA: Diagnosis not present

## 2018-11-20 DIAGNOSIS — M25551 Pain in right hip: Secondary | ICD-10-CM | POA: Diagnosis not present

## 2018-11-20 MED ORDER — METHYLPREDNISOLONE ACETATE 40 MG/ML IJ SUSP
40.0000 mg | Freq: Once | INTRAMUSCULAR | Status: DC
Start: 2018-11-20 — End: 2019-09-09

## 2018-11-20 MED ORDER — METHYLPREDNISOLONE ACETATE 40 MG/ML IJ SUSP
40.0000 mg | Freq: Once | INTRAMUSCULAR | Status: AC
  Administered 2018-11-20: 40 mg via INTRA_ARTICULAR

## 2018-11-20 NOTE — Patient Instructions (Signed)
Bursitis Bursitis is inflammation and irritation of a bursa, which is one of the small, fluid-filled sacs that cushion and protect the moving parts of your body. These sacs are located between bones and muscles, muscle attachments, or skin areas next to bones. A bursa protects these structures from the wear and tear that results from frequent movement. An inflamed bursa causes pain and swelling. Fluid may build up inside the sac. Bursitis is most common near joints, especially the knees, elbows, hips, and shoulders. What are the causes? Bursitis can be caused by:  Injury from: ? A direct blow, like falling on your knee or elbow. ? Overuse of a joint (repetitive stress).  Infection. This can happen if bacteria gets into a bursa through a cut or scrape near a joint.  Diseases that cause joint inflammation, such as gout and rheumatoid arthritis.  What increases the risk? You may be at risk for bursitis if you:  Have a job or hobby that involves a lot of repetitive stress on your joints.  Have a condition that weakens your body's defense system (immune system), such as diabetes, cancer, or HIV.  Lift and reach overhead often.  Kneel or lean on hard surfaces often.  Run or walk often.  What are the signs or symptoms? The most common signs and symptoms of bursitis are:  Pain that gets worse when you move the affected body part or put weight on it.  Inflammation.  Stiffness.  Other signs and symptoms may include:  Redness.  Tenderness.  Warmth.  Pain that continues after rest.  Fever and chills. This may occur in bursitis caused by infection.  How is this diagnosed? Bursitis may be diagnosed by:  Medical history and physical exam.  MRI.  A procedure to drain fluid from the bursa with a needle (aspiration). The fluid may be checked for signs of infection or gout.  Blood tests to rule out other causes of inflammation.  How is this treated? Bursitis can usually be  treated at home with rest, ice, compression, and elevation (RICE). For mild bursitis, RICE treatment may be all you need. Other treatments may include:  Nonsteroidal anti-inflammatory drugs (NSAIDs) to treat pain and inflammation.  Corticosteroids to fight inflammation. You may have these drugs injected into and around the area of bursitis.  Aspiration of bursitis fluid to relieve pain and improve movement.  Antibiotic medicine to treat an infected bursa.  A splint, brace, or walking aid.  Physical therapy if you continue to have pain or limited movement.  Surgery to remove a damaged or infected bursa. This may be needed if you have a very bad case of bursitis or if other treatments have not worked.  Follow these instructions at home:  Take medicines only as directed by your health care provider.  If you were prescribed an antibiotic medicine, finish it all even if you start to feel better.  Rest the affected area as directed by your health care provider. ? Keep the area elevated. ? Avoid activities that make pain worse.  Apply ice to the injured area: ? Place ice in a plastic bag. ? Place a towel between your skin and the bag. ? Leave the ice on for 20 minutes, 2-3 times a day.  Use splints, braces, pads, or walking aids as directed by your health care provider.  Keep all follow-up visits as directed by your health care provider. This is important. How is this prevented?  Wear knee pads if you kneel often.    Wear sturdy running or walking shoes that fit you well.  Take regular breaks from repetitive activity.  Warm up by stretching before doing any strenuous activity.  Maintain a healthy weight or lose weight as recommended by your health care provider. Ask your health care provider if you need help.  Exercise regularly. Start any new physical activity gradually. Contact a health care provider if:  Your bursitis is not responding to treatment or home care.  You have  a fever.  You have chills. This information is not intended to replace advice given to you by your health care provider. Make sure you discuss any questions you have with your health care provider. Document Released: 12/13/2000 Document Revised: 05/23/2016 Document Reviewed: 03/07/2014 Elsevier Interactive Patient Education  2018 [employer]  

## 2018-11-20 NOTE — Progress Notes (Signed)
  Subjective:     Patient ID: Alicia Boyd, female   DOB: Oct 13, 1930, 84 y.o.   MRN: [id number]  HPI Patient seen with right lateral hip pain.  Onset a few days ago.  Pain has been fairly severe at night and interfering with sleep.  Denies any injury.  No recent falls.  No low back pain.  She took some Tylenol without much relief.  Did not try any heat or ice.  No skin rash.  No anterior or medial hip pain.  Stays very busy helping to care for her husband who has advanced dementia  Past Medical History:  Diagnosis Date  . Aortic heart murmur    AS/AI on 2D ECHO ; SBE prophylaxis Rxed  . Chest pain 2007 ; 2011   negative cardiac assessment  . Hyperlipidemia   . Interstitial cystitis    Resolved  . Osteoporosis    T score - 3.0 @ femoral neck; Fosamax affected swallowing  . Postmenopausal    No PMH or HRT   Past Surgical History:  Procedure Laterality Date  . COLONOSCOPY  2011   Dr Sharlett Iles    reports that she has never smoked. She has never used smokeless tobacco. She reports that she does not drink alcohol or use drugs. family history includes Coronary artery disease in her sister; Diabetes in her father; Heart disease in her mother; Hypertension in her mother; Osteoporosis in her mother; Stroke (age of onset: 48) in her sister; Thyroid disease in her sister. Allergies  Allergen Reactions  . Amitid [Amitriptyline Hcl]     Patient experienced a dizzy, spinning feeling   . Metronidazole     REACTION: throat swells  . Penicillins     REACTION: rash & fever  . Alendronate Sodium     REACTION: unspecified  . Clarithromycin     GI intolerance  . Flonase [Fluticasone Propionate]     Slight Headache   . Montelukast Other (See Comments)    unknown  . Promethazine Hcl     REACTION: unspecified  . Robitussin (Alcohol Free) [Guaifenesin] Other (See Comments)    unknown hyperactive  . Spironolactone     09/21/13 weakness in legs  . Singulair [Montelukast Sodium]    "spacey"; ineffective     Review of Systems  Constitutional: Negative for appetite change, chills, fever and unexpected weight change.  Skin: Negative for rash.  Neurological: Negative for weakness.       Objective:   Physical Exam  Constitutional: She appears well-developed and well-nourished.  Cardiovascular: Normal rate.  Pulmonary/Chest: Effort normal and breath sounds normal.  Musculoskeletal: She exhibits no edema.  Excellent range of motion right hip.  She has some localized tenderness over the right greater trochanteric bursa.  No ecchymosis.  No erythema.  No visible swelling.  Neurological:  Full strength with plantarflexion, dorsiflexion, and knee extension bilaterally       Assessment:     Right lateral hip pain.  Suspect acute greater trochanteric bursitis    Plan:     -Discussed options.  We discussed steroid injection including risk of bruising, infection and patient consented.  Prepped skin with Betadine.  Using 25-gauge one and 1/2 inch needle injected 40 mg Depo-Medrol and 2 cc of plain Xylocaine and patient tolerated well.  -She will try some icing tonight and touch base by next week if not improving  Eulas Post MD [facility]

## 2018-12-15 ENCOUNTER — Ambulatory Visit: Payer: MEDICARE | Admitting: Family Medicine

## 2018-12-29 ENCOUNTER — Other Ambulatory Visit: Payer: Self-pay | Admitting: Adult Health

## 2018-12-30 NOTE — Progress Notes (Deleted)
[name] [address] [place], [place] [address] Phone: [phone] Subjective:    I'm seeing this patient by the request  of:    CC:   [id number]  Alicia Boyd is a 85 y.o. female coming in with complaint of ***  Onset-  Location Duration-  Character- Aggravating factors- Reliving factors-  Therapies tried-  Severity-     Past Medical History:  Diagnosis Date  . Aortic heart murmur    AS/AI on 2D ECHO ; SBE prophylaxis Rxed  . Chest pain 2007 ; 2011   negative cardiac assessment  . Hyperlipidemia   . Interstitial cystitis    Resolved  . Osteoporosis    T score - 3.0 @ femoral neck; Fosamax affected swallowing  . Postmenopausal    No PMH or HRT   Past Surgical History:  Procedure Laterality Date  . COLONOSCOPY  2011   Dr Sharlett Iles   Social History   Socioeconomic History  . Marital status: Married    Spouse name: Not on file  . Number of children: Not on file  . Years of education: Not on file  . Highest education level: Not on file  Occupational History  . Not on file  Social Needs  . Financial resource strain: Not on file  . Food insecurity:    Worry: Not on file    Inability: Not on file  . Transportation needs:    Medical: Not on file    Non-medical: Not on file  Tobacco Use  . Smoking status: Never Smoker  . Smokeless tobacco: Never Used  Substance and Sexual Activity  . Alcohol use: No  . Drug use: No  . Sexual activity: Not on file  Lifestyle  . Physical activity:    Days per week: Not on file    Minutes per session: Not on file  . Stress: Not on file  Relationships  . Social connections:    Talks on phone: Not on file    Gets together: Not on file    Attends religious service: Not on file    Active member of club or organization: Not on file    Attends meetings of clubs or organizations: Not on file    Relationship status: Not on file  Other Topics Concern  . Not on file  Social History  Narrative  . Not on file   Allergies  Allergen Reactions  . Amitid [Amitriptyline Hcl]     Patient experienced a dizzy, spinning feeling   . Metronidazole     REACTION: throat swells  . Penicillins     REACTION: rash & fever  . Alendronate Sodium     REACTION: unspecified  . Clarithromycin     GI intolerance  . Flonase [Fluticasone Propionate]     Slight Headache   . Montelukast Other (See Comments)    unknown  . Promethazine Hcl     REACTION: unspecified  . Robitussin (Alcohol Free) [Guaifenesin] Other (See Comments)    unknown hyperactive  . Spironolactone     09/21/13 weakness in legs  . Singulair [Montelukast Sodium]     "spacey"; ineffective   Family History  Problem Relation Age of Onset  . Diabetes Father        Pancreatitic insufficiency post MVA  . Hypertension Mother   . Osteoporosis Mother   . Heart disease Mother        AF  . Thyroid disease Sister  hypothyroidism  . Coronary artery disease Sister        AF  . Stroke Sister 94       also pulmonary disease     Current Facility-Administered Medications (Endocrine & Metabolic):  .  methylPREDNISolone acetate (DEPO-MEDROL) injection 40 mg  Current Outpatient Medications (Cardiovascular):  .  amLODipine (NORVASC) 5 MG tablet, TAKE 1 TABLET BY MOUTH EVERY DAY .  metoprolol succinate (TOPROL-XL) 25 MG 24 hr tablet, TAKE 1 TABLET BY MOUTH EVERY DAY   Current Outpatient Medications (Respiratory):  .  fexofenadine (ALLEGRA) 180 MG tablet, Take 180 mg by mouth daily as needed.  Marland Kitchen  ipratropium (ATROVENT) 0.03 % nasal spray, USE 2 SPRAYS IN EACH NOSTRIL EVERY 12 HOURS. DO NOT USE MORE THAN 5 DAYS       Current Outpatient Medications (Other):  Marland Kitchen  Calcium Carbonate (CALCIUM 600 PO), Take by mouth daily. .  Cholecalciferol (VITAMIN D3) 1000 UNITS CAPS, Take by mouth daily. Marland Kitchen  gabapentin (NEURONTIN) 100 MG capsule, Take 1 capsule (100 mg total) by mouth at bedtime. .  Multiple Vitamin (MULTIVITAMIN)  capsule, Take 1 capsule by mouth daily.   Vladimir Faster Glycol-Propyl Glycol (SYSTANE OP), Apply to eye.     Past medical history, social, surgical and family history all reviewed in electronic medical record.  No pertanent information unless stated regarding to the chief complaint.   Review of Systems:  No headache, visual changes, nausea, vomiting, diarrhea, constipation, dizziness, abdominal pain, skin rash, fevers, chills, night sweats, weight loss, swollen lymph nodes, body aches, joint swelling, muscle aches, chest pain, shortness of breath, mood changes.   Objective  There were no vitals taken for this visit. Systems examined below as of    General: No apparent distress alert and oriented x3 mood and affect normal, dressed appropriately.  HEENT: Pupils equal, extraocular movements intact  Respiratory: Patient's speak in full sentences and does not appear short of breath  Cardiovascular: No lower extremity edema, non tender, no erythema  Skin: Warm dry intact with no signs of infection or rash on extremities or on axial skeleton.  Abdomen: Soft nontender  Neuro: Cranial nerves II through XII are intact, neurovascularly intact in all extremities with 2+ DTRs and 2+ pulses.  Lymph: No lymphadenopathy of posterior or anterior cervical chain or axillae bilaterally.  Gait normal with good balance and coordination.  MSK:  Non tender with full range of motion and good stability and symmetric strength and tone of shoulders, elbows, wrist, hip, knee and ankles bilaterally.     Impression and Recommendations:     This case required medical decision making of moderate complexity. The above documentation has been reviewed and is accurate and complete Lyndal Pulley, DO       Note: This dictation was prepared with Dragon dictation along with smaller phrase technology. Any transcriptional errors that result from this process are unintentional.

## 2018-12-31 ENCOUNTER — Ambulatory Visit: Payer: MEDICARE | Admitting: Family Medicine

## 2019-01-04 NOTE — Progress Notes (Signed)
[name] [address] [place], [place] [address] Phone: [phone] Subjective:    I [name] am serving as a Education administrator for Dr. Hulan Saas.    CC: Right hand pain, hip pain  [id number]  Alicia Boyd is a 85 y.o. female coming in with complaint of right hand pain. Received an injection last visit. Would like an injection today. Also wants a right hip injection.  Did have carpal tunnel syndrome.  Has had very good response to injections previously.  Last injection was 3 months ago.  Started having increasing numbness again in the morning.  Also has had right hip pain.  Lateral.  Waking her up at night.  Worse after sitting for long amount of time.  No significant radiation.  No increasing weakness of the lower extremities.      Past Medical History:  Diagnosis Date  . Aortic heart murmur    AS/AI on 2D ECHO ; SBE prophylaxis Rxed  . Chest pain 2007 ; 2011   negative cardiac assessment  . Hyperlipidemia   . Interstitial cystitis    Resolved  . Osteoporosis    T score - 3.0 @ femoral neck; Fosamax affected swallowing  . Postmenopausal    No PMH or HRT   Past Surgical History:  Procedure Laterality Date  . COLONOSCOPY  2011   Dr Sharlett Iles   Social History   Socioeconomic History  . Marital status: Married    Spouse name: Not on file  . Number of children: Not on file  . Years of education: Not on file  . Highest education level: Not on file  Occupational History  . Not on file  Social Needs  . Financial resource strain: Not on file  . Food insecurity:    Worry: Not on file    Inability: Not on file  . Transportation needs:    Medical: Not on file    Non-medical: Not on file  Tobacco Use  . Smoking status: Never Smoker  . Smokeless tobacco: Never Used  Substance and Sexual Activity  . Alcohol use: No  . Drug use: No  . Sexual activity: Not on file  Lifestyle  . Physical activity:    Days per week: Not on file    Minutes per session: Not on file  . Stress: Not on file  Relationships  . Social connections:    Talks on phone: Not on file    Gets together: Not on file    Attends religious service: Not on file    Active member of club or organization: Not on file    Attends meetings of clubs or organizations: Not on file    Relationship status: Not on file  Other Topics Concern  . Not on file  Social History Narrative  . Not on file   Allergies  Allergen Reactions  . Amitid [Amitriptyline Hcl]     Patient experienced a dizzy, spinning feeling   . Metronidazole     REACTION: throat swells  . Penicillins     REACTION: rash & fever  . Alendronate Sodium     REACTION: unspecified  . Clarithromycin     GI intolerance  . Flonase [Fluticasone Propionate]     Slight Headache   . Montelukast Other (See Comments)    unknown  . Promethazine Hcl     REACTION: unspecified  . Robitussin (Alcohol Free) [Guaifenesin] Other (See Comments)    unknown hyperactive  . Spironolactone  09/21/13 weakness in legs  . Singulair [Montelukast Sodium]     "spacey"; ineffective   Family History  Problem Relation Age of Onset  . Diabetes Father        Pancreatitic insufficiency post MVA  . Hypertension Mother   . Osteoporosis Mother   . Heart disease Mother        AF  . Thyroid disease Sister        hypothyroidism  . Coronary artery disease Sister        AF  . Stroke Sister 54       also pulmonary disease     Current Facility-Administered Medications (Endocrine & Metabolic):  .  methylPREDNISolone acetate (DEPO-MEDROL) injection 40 mg  Current Outpatient Medications (Cardiovascular):  .  amLODipine (NORVASC) 5 MG tablet, TAKE 1 TABLET BY MOUTH EVERY DAY .  metoprolol succinate (TOPROL-XL) 25 MG 24 hr tablet, TAKE 1 TABLET BY MOUTH EVERY DAY   Current Outpatient Medications (Respiratory):  .  fexofenadine (ALLEGRA) 180 MG tablet, Take 180 mg by mouth daily as needed.  Marland Kitchen  ipratropium  (ATROVENT) 0.03 % nasal spray, USE 2 SPRAYS IN EACH NOSTRIL EVERY 12 HOURS. DO NOT USE MORE THAN 5 DAYS       Current Outpatient Medications (Other):  Marland Kitchen  Calcium Carbonate (CALCIUM 600 PO), Take by mouth daily. .  Cholecalciferol (VITAMIN D3) 1000 UNITS CAPS, Take by mouth daily. Marland Kitchen  gabapentin (NEURONTIN) 100 MG capsule, Take 1 capsule (100 mg total) by mouth at bedtime. .  Multiple Vitamin (MULTIVITAMIN) capsule, Take 1 capsule by mouth daily.   Vladimir Faster Glycol-Propyl Glycol (SYSTANE OP), Apply to eye.     Past medical history, social, surgical and family history all reviewed in electronic medical record.  No pertanent information unless stated regarding to the chief complaint.   Review of Systems:  No headache, visual changes, nausea, vomiting, diarrhea, constipation, dizziness, abdominal pain, skin rash, fevers, chills, night sweats, weight loss, swollen lymph nodes, body aches, joint swelling,  chest pain, shortness of breath, mood changes.  Positive muscle aches  Objective  Blood pressure 118/80, pulse 77, height 5\' 5"  (1.651 m), weight 111 lb (50.3 kg), SpO2 96 %. S   General: No apparent distress alert and oriented x3 mood and affect normal, dressed appropriately.  Mild cachectic HEENT: Pupils equal, extraocular movements intact  Respiratory: Patient's speak in full sentences and does not appear short of breath  Cardiovascular: No lower extremity edema, non tender, no erythema  Skin: Warm dry intact with no signs of infection or rash on extremities or on axial skeleton.  Abdomen: Soft nontender  Neuro: Cranial nerves II through XII are intact, neurovascularly intact in all extremities with 2+ DTRs and 2+ pulses.  Lymph: No lymphadenopathy of posterior or anterior cervical chain or axillae bilaterally.  Gait antalgic MSK:  tender with limited range of motionof shoulders, elbows,  knee and ankles bilaterally.  Right hip exam shows severe tenderness to palpation over the  lateral greater trochanteric area.  Mild over the gluteal area.  No pain with internal rotation.  Negative straight leg test.  4+ out of 5 strength with symmetric to contralateral side.  Right wrist exam shows the patient does have a positive Tinel sign.  Positive Phalen sign.  Grip strength still intact though.  No significant thenar eminence wasting compared to the contralateral side.  Procedure: Real-time Ultrasound Guided Injection of right carpal tunnel Device: GE Logiq Q7  Ultrasound guided injection is preferred based studies  that show increased duration, increased effect, greater accuracy, decreased procedural pain, increased response rate with ultrasound guided versus blind injection.  Verbal informed consent obtained.  Time-out conducted.  Noted no overlying erythema, induration, or other signs of local infection.  Skin prepped in a sterile fashion.  Local anesthesia: Topical Ethyl chloride.  With sterile technique and under real time ultrasound guidance:  median nerve visualized.  23g 5/8 inch needle inserted distal to proximal approach into nerve sheath. Pictures taken nfor needle placement. Patient did have injection of 2 cc of 1% lidocaine, 1 cc of 0.5% Marcaine, and 1 cc of Kenalog 40 mg/dL. Completed without difficulty  Pain immediately resolved suggesting accurate placement of the medication.  Advised to call if fevers/chills, erythema, induration, drainage, or persistent bleeding.  Images permanently stored and available for review in the ultrasound unit.  Impression: Technically successful ultrasound guided injection.   Procedure: Real-time Ultrasound Guided Injection of right greater trochanteric bursitis secondary to patient's body habitus Device: GE Logiq Q7 Ultrasound guided injection is preferred based studies that show increased duration, increased effect, greater accuracy, decreased procedural pain, increased response rate, and decreased cost with ultrasound guided  versus blind injection.  Verbal informed consent obtained.  Time-out conducted.  Noted no overlying erythema, induration, or other signs of local infection.  Skin prepped in a sterile fashion.  Local anesthesia: Topical Ethyl chloride.  With sterile technique and under real time ultrasound guidance:  Greater trochanteric area was visualized and patient's bursa was noted. A 22-gauge 3 inch needle was inserted and 4 cc of 0.5% Marcaine and 1 cc of Kenalog 40 mg/dL was injected. Pictures taken Completed without difficulty  Pain immediately resolved suggesting accurate placement of the medication.  Advised to call if fevers/chills, erythema, induration, drainage, or persistent bleeding.  Images permanently stored and available for review in the ultrasound unit.  Impression: Technically successful ultrasound guided injection.   Impression and Recommendations:     This case required medical decision making of moderate complexity. The above documentation has been reviewed and is accurate and complete Lyndal Pulley, DO       Note: This dictation was prepared with Dragon dictation along with smaller phrase technology. Any transcriptional errors that result from this process are unintentional.

## 2019-01-05 ENCOUNTER — Ambulatory Visit (INDEPENDENT_AMBULATORY_CARE_PROVIDER_SITE_OTHER): Payer: MEDICARE | Admitting: Family Medicine

## 2019-01-05 ENCOUNTER — Encounter: Payer: Self-pay | Admitting: Family Medicine

## 2019-01-05 ENCOUNTER — Ambulatory Visit: Payer: Self-pay

## 2019-01-05 VITALS — BP 118/80 | HR 77 | Ht 65.0 in | Wt 111.0 lb

## 2019-01-05 DIAGNOSIS — G5601 Carpal tunnel syndrome, right upper limb: Secondary | ICD-10-CM

## 2019-01-05 DIAGNOSIS — M25551 Pain in right hip: Secondary | ICD-10-CM

## 2019-01-05 DIAGNOSIS — M7061 Trochanteric bursitis, right hip: Secondary | ICD-10-CM

## 2019-01-05 NOTE — Patient Instructions (Signed)
Good to see you  Ice is your friend Injected the carpal tunnel and the hip  See me again in 3 months  Happy New Year!

## 2019-01-05 NOTE — Assessment & Plan Note (Signed)
Injection given today.  Tolerated procedure well.  Discussed bracing at night, icing regimen, topical anti-inflammatories.  Follow-up again in 3 months

## 2019-01-05 NOTE — Assessment & Plan Note (Signed)
Patient given injection.  Tolerated the procedure well.  Discussed icing regimen and home exercise.  Discussed which activities to do which wants to avoid.  Follow-up again in 12 weeks.

## 2019-02-10 ENCOUNTER — Encounter: Payer: Self-pay | Admitting: Adult Health

## 2019-03-16 ENCOUNTER — Ambulatory Visit: Payer: MEDICARE | Admitting: Adult Health

## 2019-03-30 ENCOUNTER — Ambulatory Visit: Payer: MEDICARE | Admitting: Adult Health

## 2019-05-05 ENCOUNTER — Other Ambulatory Visit: Payer: Self-pay

## 2019-05-05 ENCOUNTER — Emergency Department (HOSPITAL_COMMUNITY): Payer: MEDICARE

## 2019-05-05 ENCOUNTER — Emergency Department (HOSPITAL_COMMUNITY)
Admission: EM | Admit: 2019-05-05 | Discharge: 2019-05-05 | Disposition: A | Discharge status: Home / Self Care | Payer: MEDICARE | Attending: Emergency Medicine | Admitting: Emergency Medicine

## 2019-05-05 DIAGNOSIS — Y93E5 Activity, floor mopping and cleaning: Secondary | ICD-10-CM | POA: Insufficient documentation

## 2019-05-05 DIAGNOSIS — R079 Chest pain, unspecified: Secondary | ICD-10-CM | POA: Diagnosis not present

## 2019-05-05 DIAGNOSIS — S14109A Unspecified injury at unspecified level of cervical spinal cord, initial encounter: Secondary | ICD-10-CM | POA: Diagnosis not present

## 2019-05-05 DIAGNOSIS — Y929 Unspecified place or not applicable: Secondary | ICD-10-CM | POA: Insufficient documentation

## 2019-05-05 DIAGNOSIS — M25551 Pain in right hip: Secondary | ICD-10-CM | POA: Diagnosis not present

## 2019-05-05 DIAGNOSIS — Z79899 Other long term (current) drug therapy: Secondary | ICD-10-CM | POA: Insufficient documentation

## 2019-05-05 DIAGNOSIS — W19XXXA Unspecified fall, initial encounter: Secondary | ICD-10-CM | POA: Diagnosis not present

## 2019-05-05 DIAGNOSIS — M25572 Pain in left ankle and joints of left foot: Secondary | ICD-10-CM | POA: Diagnosis not present

## 2019-05-05 DIAGNOSIS — Y999 Unspecified external cause status: Secondary | ICD-10-CM | POA: Insufficient documentation

## 2019-05-05 DIAGNOSIS — S32501A Unspecified fracture of right pubis, initial encounter for closed fracture: Secondary | ICD-10-CM | POA: Diagnosis not present

## 2019-05-05 DIAGNOSIS — M79651 Pain in right thigh: Secondary | ICD-10-CM | POA: Diagnosis not present

## 2019-05-05 DIAGNOSIS — S79911A Unspecified injury of right hip, initial encounter: Secondary | ICD-10-CM | POA: Diagnosis present

## 2019-05-05 DIAGNOSIS — S32591A Other specified fracture of right pubis, initial encounter for closed fracture: Secondary | ICD-10-CM

## 2019-05-05 DIAGNOSIS — W010XXA Fall on same level from slipping, tripping and stumbling without subsequent striking against object, initial encounter: Secondary | ICD-10-CM | POA: Insufficient documentation

## 2019-05-05 DIAGNOSIS — R52 Pain, unspecified: Secondary | ICD-10-CM | POA: Diagnosis not present

## 2019-05-05 LAB — BASIC METABOLIC PANEL
Anion gap: 8 (ref 5–15)
BUN: 22 mg/dL (ref 8–23)
CO2: 26 mmol/L (ref 22–32)
Calcium: 9.2 mg/dL (ref 8.9–10.3)
Chloride: 104 mmol/L (ref 98–111)
Creatinine, Ser: 0.58 mg/dL (ref 0.44–1.00)
GFR calc Af Amer: >60 mL/min (ref >60)
GFR calc non Af Amer: >60 mL/min (ref >60)
Glucose, Bld: 134 mg/dL — ABNORMAL HIGH (ref 70–99)
Potassium: 4 mmol/L (ref 3.5–5.1)
Sodium: 138 mmol/L (ref 135–145)

## 2019-05-05 LAB — CBC WITH DIFFERENTIAL/PLATELET
Abs Immature Granulocytes: 0.11 10*3/uL — ABNORMAL HIGH (ref 0.00–0.07)
Basophils Absolute: 0 10*3/uL (ref 0.0–0.1)
Basophils Relative: 0 %
Eosinophils Absolute: 0.1 10*3/uL (ref 0.0–0.5)
Eosinophils Relative: 0 %
HCT: 35.7 % — ABNORMAL LOW (ref 36.0–46.0)
Hemoglobin: 11.9 g/dL — ABNORMAL LOW (ref 12.0–15.0)
Immature Granulocytes: 1 %
Lymphocytes Relative: 5 %
Lymphs Abs: 0.6 10*3/uL — ABNORMAL LOW (ref 0.7–4.0)
MCH: 32.5 pg (ref 26.0–34.0)
MCHC: 33.3 g/dL (ref 30.0–36.0)
MCV: 97.5 fL (ref 80.0–100.0)
Monocytes Absolute: 0.4 10*3/uL (ref 0.1–1.0)
Monocytes Relative: 4 %
Neutro Abs: 10 10*3/uL — ABNORMAL HIGH (ref 1.7–7.7)
Neutrophils Relative %: 90 %
Platelets: 195 10*3/uL (ref 150–400)
RBC: 3.66 MIL/uL — ABNORMAL LOW (ref 3.87–5.11)
RDW: 12.6 % (ref 11.5–15.5)
WBC: 11.2 10*3/uL — ABNORMAL HIGH (ref 4.0–10.5)
nRBC: 0 % (ref 0.0–0.2)

## 2019-05-05 LAB — TYPE AND SCREEN
ABO/RH(D): A POS
Antibody Screen: NEGATIVE

## 2019-05-05 LAB — PROTIME-INR
INR: 1 (ref 0.8–1.2)
Prothrombin Time: 13.5 seconds (ref 11.4–15.2)

## 2019-05-05 LAB — ABO/RH: ABO/RH(D): A POS

## 2019-05-05 MED ORDER — TRAMADOL HCL 50 MG PO TABS
50.0000 mg | ORAL_TABLET | Freq: Once | ORAL | Status: AC
Start: 2019-05-05 — End: 2019-05-05
  Administered 2019-05-05: 50 mg via ORAL
  Filled 2019-05-05: qty 1

## 2019-05-05 MED ORDER — TRAMADOL HCL 50 MG PO TABS: 50.0000 mg | ORAL_TABLET | Freq: Four times a day (QID) | ORAL | 0 refills | Status: DC | PRN

## 2019-05-05 MED ORDER — ACETAMINOPHEN 500 MG PO TABS
1000.0000 mg | ORAL_TABLET | Freq: Once | ORAL | Status: AC
  Administered 2019-05-05: 1000 mg via ORAL
  Filled 2019-05-05: qty 2

## 2019-05-05 NOTE — ED Provider Notes (Signed)
[facility] DEPT Provider Note   CSN: [id number] Arrival date & time: 05/05/19  1905    History   Chief Complaint Chief Complaint  Patient presents with  . Fall    HPI Alicia Boyd is a 85 y.o. female who presents to the ER with cc of  Fall and R hip pain.The patient states that she was mopping the floor and got her feet caught up in the mop. She fell on to her right hip and caught herself with her R Hand. She did not hit her head or lose consciousness. She was able to stand but complains of Deep aching pain in the front of her femur when she bears weigh on the leg. She denies numbness, tingling or back pain.     HPI  Past Medical History:  Diagnosis Date  . Aortic heart murmur    AS/AI on 2D ECHO ; SBE prophylaxis Rxed  . Chest pain 2007 ; 2011   negative cardiac assessment  . Hyperlipidemia   . Interstitial cystitis    Resolved  . Osteoporosis    T score - 3.0 @ femoral neck; Fosamax affected swallowing  . Postmenopausal    No PMH or HRT    Patient Active Problem List   Diagnosis Date Noted  . Greater trochanteric bursitis of right hip 01/05/2019  . Degenerative arthritis of left knee 04/16/2018  . Mild carpal tunnel syndrome of right wrist 09/24/2017  . Ecchymoses, spontaneous 07/29/2016  . Varicose veins 07/29/2016  . Nasal congestion 07/29/2016  . Hyperglycemia 07/12/2015  . Paresthesia of hand 03/22/2014  . Undiagnosed cardiac murmurs 03/22/2014  . Palpitations 03/03/2012  . ANEMIA, MILD 07/27/2010  . Venous (peripheral) insufficiency 07/27/2010  . OTHER CONSTIPATION 04/17/2010  . Essential hypertension 04/06/2010  . RHINOCONJUNCTIVITIS, ALLERGIC 04/04/2010  . PERSONAL HISTORY OTHER DISORDER URINARY SYSTEM 01/25/2010  . OTHER DYSPHAGIA 04/11/2009  . ESOPHAGEAL REFLUX 01/24/2009  . OTHER AND UNSPECIFIED HYPERLIPIDEMIA 11/23/2008  . Osteoporosis 11/23/2008  . NONSPECIFIC ABNORMAL ELECTROCARDIOGRAM 11/23/2008    Past  Surgical History:  Procedure Laterality Date  . COLONOSCOPY  2011   Dr Sharlett Iles     OB History   No obstetric history on file.      Home Medications    Prior to Admission medications   Medication Sig Start Date End Date Taking? Authorizing Provider  amLODipine (NORVASC) 5 MG tablet TAKE 1 TABLET BY MOUTH EVERY DAY 12/29/18   Nafziger, [name], NP  Calcium Carbonate (CALCIUM 600 PO) Take by mouth daily.    [provider]  Cholecalciferol (VITAMIN D3) 1000 UNITS CAPS Take by mouth daily.    [provider]  fexofenadine (ALLEGRA) 180 MG tablet Take 180 mg by mouth daily as needed.     [provider]  gabapentin (NEURONTIN) 100 MG capsule Take 1 capsule (100 mg total) by mouth at bedtime. 01/05/18   Lyndal Pulley, DO  ipratropium (ATROVENT) 0.03 % nasal spray USE 2 SPRAYS IN EACH NOSTRIL EVERY 12 HOURS. DO NOT USE MORE THAN 5 DAYS 11/14/16   Binnie Rail, MD  metoprolol succinate (TOPROL-XL) 25 MG 24 hr tablet TAKE 1 TABLET BY MOUTH EVERY DAY 12/29/18   Nafziger, [name], NP  Multiple Vitamin (MULTIVITAMIN) capsule Take 1 capsule by mouth daily.      [provider]  Polyethyl Glycol-Propyl Glycol (SYSTANE OP) Apply to eye.    [provider]    Family History Family History  Problem Relation Age of Onset  .  Diabetes Father        Pancreatitic insufficiency post MVA  . Hypertension Mother   . Osteoporosis Mother   . Heart disease Mother        AF  . Thyroid disease Sister        hypothyroidism  . Coronary artery disease Sister        AF  . Stroke Sister 71       also pulmonary disease    Social History Social History   Tobacco Use  . Smoking status: Never Smoker  . Smokeless tobacco: Never Used  Substance Use Topics  . Alcohol use: No  . Drug use: No     Allergies   Amitid [amitriptyline hcl]; Metronidazole; Penicillins; Alendronate sodium; Clarithromycin; Flonase [fluticasone propionate]; Promethazine hcl; Robitussin  (alcohol free) [guaifenesin]; Spironolactone; and Singulair [montelukast sodium]   Review of Systems Review of Systems  Ten systems reviewed and are negative for acute change, except as noted in the HPI.   Physical Exam Updated Vital Signs BP (!) 188/85 (BP Location: Left Arm)   Pulse 83   Temp 98.5 F (36.9 C) (Oral)   Resp 15   Ht 5\' 4"  (1.626 m)   Wt 59 kg   SpO2 98%   BMI 22.31 kg/m   Physical Exam Vitals signs and nursing note reviewed.  Constitutional:      General: She is not in acute distress.    Appearance: She is well-developed. She is not diaphoretic.  HENT:     Head: Normocephalic and atraumatic.  Eyes:     General: No scleral icterus.    Conjunctiva/sclera: Conjunctivae normal.  Neck:     Musculoskeletal: Normal range of motion.  Cardiovascular:     Rate and Rhythm: Normal rate and regular rhythm.     Heart sounds: Normal heart sounds. No murmur. No friction rub. No gallop.   Pulmonary:     Effort: Pulmonary effort is normal. No respiratory distress.     Breath sounds: Normal breath sounds.  Abdominal:     General: Bowel sounds are normal. There is no distension.     Palpations: Abdomen is soft. There is no mass.     Tenderness: There is no abdominal tenderness. There is no guarding.  Musculoskeletal:     Comments: No tenderness to palpation of the hips, or pelvis. No obvious deformity. Pain in the front of the leg with passive hip flexion at about 20 degrees. No pain with IR/ER. No back pain with palpation. NVI  Skin:    General: Skin is warm and dry.  Neurological:     Mental Status: She is alert and oriented to person, place, and time.  Psychiatric:        Behavior: Behavior normal.      ED Treatments / Results  Labs (all labs ordered are listed, but only abnormal results are displayed) Labs Reviewed  BASIC METABOLIC PANEL  CBC WITH DIFFERENTIAL/PLATELET  PROTIME-INR  TYPE AND SCREEN    EKG None  Radiology No results found.   Procedures Procedures (including critical care time)  Medications Ordered in ED Medications - No data to display   Initial Impression / Assessment and Plan / ED Course  I have reviewed the triage vital signs and the nursing notes.  Pertinent labs & imaging results that were available during my care of the patient were reviewed by me and considered in my medical decision making (see chart for details).        84 year old female  with mechanical fall.  X-ray of the pelvis shows a right inferior ramus fracture.  The patient is adamant that she does not want to be admitted as she is the exclusive caretaker of her husband who has a aphasia and does want to go home.  She was seen and shared visit with Dr. Erick Colace.  Patient was able to ambulate here in the emergency department with assistance.  She has a walker at home.  We have given her dose of tramadol and I have ordered some medication in the outpatient setting.Review of labs shows a mildly elevated white count and blood glucose.  She has mild normocytic anemia.  Recently reviewed the chest x-ray, femur x-ray, hip and lumbar spine films shows no acute abnormalities other than the inferior ramus fracture.  Patient appears otherwise appropriate for discharge at this time.  I have ordered home OT PT skilled nursing and social work.  Patient agrees with plan and is stable for discharge at this time  Final Clinical Impressions(s) / ED Diagnoses   Final diagnoses:  None    ED Discharge Orders    None       Margarita Mail, PA-C 05/05/19 2157    Drenda Freeze, MD 05/05/19 2259

## 2019-05-05 NOTE — ED Triage Notes (Signed)
Per EMS, pt stated she was mopping floor and slipped and fell. C/O RLE and Right hip pain. No step off noted.  + right pedal pulse noted. Denies hitting head, no hx of falls

## 2019-05-05 NOTE — ED Notes (Signed)
Bed: EL85 Expected date:  Expected time:  Means of arrival:  Comments: 85yo hip pain

## 2019-05-05 NOTE — Discharge Instructions (Signed)
Contact a health care provider if: Your pain gets worse. Your pain is not relieved with medicines. Get help right away if you: Feel light-headed or faint. Develop chest pain. Develop shortness of breath. Have a fever. Have blood in your urine or your stools. Have bleeding in your vagina. Have difficulty or pain with urination or with passing stool. Have difficulty or increased pain with walking. Have new or increased swelling in one of your legs. Have numbness in your legs or groin area.

## 2019-05-09 DIAGNOSIS — I10 Essential (primary) hypertension: Secondary | ICD-10-CM | POA: Diagnosis not present

## 2019-05-09 DIAGNOSIS — M1712 Unilateral primary osteoarthritis, left knee: Secondary | ICD-10-CM | POA: Diagnosis not present

## 2019-05-09 DIAGNOSIS — M810 Age-related osteoporosis without current pathological fracture: Secondary | ICD-10-CM | POA: Diagnosis not present

## 2019-05-09 DIAGNOSIS — D649 Anemia, unspecified: Secondary | ICD-10-CM | POA: Diagnosis not present

## 2019-05-09 DIAGNOSIS — Z9181 History of falling: Secondary | ICD-10-CM | POA: Diagnosis not present

## 2019-05-09 DIAGNOSIS — S32501D Unspecified fracture of right pubis, subsequent encounter for fracture with routine healing: Secondary | ICD-10-CM | POA: Diagnosis not present

## 2019-05-09 DIAGNOSIS — K219 Gastro-esophageal reflux disease without esophagitis: Secondary | ICD-10-CM | POA: Diagnosis not present

## 2019-05-09 DIAGNOSIS — W19XXXD Unspecified fall, subsequent encounter: Secondary | ICD-10-CM | POA: Diagnosis not present

## 2019-05-09 DIAGNOSIS — I872 Venous insufficiency (chronic) (peripheral): Secondary | ICD-10-CM | POA: Diagnosis not present

## 2019-05-09 DIAGNOSIS — M7061 Trochanteric bursitis, right hip: Secondary | ICD-10-CM | POA: Diagnosis not present

## 2019-05-10 ENCOUNTER — Telehealth: Payer: Self-pay | Admitting: Adult Health

## 2019-05-10 DIAGNOSIS — I872 Venous insufficiency (chronic) (peripheral): Secondary | ICD-10-CM | POA: Diagnosis not present

## 2019-05-10 DIAGNOSIS — M7061 Trochanteric bursitis, right hip: Secondary | ICD-10-CM | POA: Diagnosis not present

## 2019-05-10 DIAGNOSIS — S32501D Unspecified fracture of right pubis, subsequent encounter for fracture with routine healing: Secondary | ICD-10-CM | POA: Diagnosis not present

## 2019-05-10 DIAGNOSIS — M1712 Unilateral primary osteoarthritis, left knee: Secondary | ICD-10-CM | POA: Diagnosis not present

## 2019-05-10 DIAGNOSIS — W19XXXD Unspecified fall, subsequent encounter: Secondary | ICD-10-CM | POA: Diagnosis not present

## 2019-05-10 DIAGNOSIS — I10 Essential (primary) hypertension: Secondary | ICD-10-CM | POA: Diagnosis not present

## 2019-05-10 NOTE — Telephone Encounter (Signed)
Copied from [place] [phone]. Topic: Quick Communication - Home Health Verbal Orders >> May 10, 2019  9:09 AM Alanda Slim E wrote: Caller/Agency: Deanne/ [facility] Number: [phone] [place] went to open Pt for Home Health services on 5.10.20 Frequency: 2x a week for 2 weeks and  1x a week for 2 weeks   FYI: Social worker is going out to see the Pt this morning and they will assist to place the Pt and her husband until she is better and able to take care of her husband. Pt is unable to care for her husband due to the fracture.

## 2019-05-11 ENCOUNTER — Telehealth: Payer: Self-pay | Admitting: Adult Health

## 2019-05-11 NOTE — Telephone Encounter (Signed)
Copied from [place] [phone]. Topic: Quick Communication - See Telephone Encounter >> May 11, 2019  7:35 AM Robina Ade, Helene Kelp D wrote: CRM for notification. See Telephone encounter for: 05/11/19. Liji with [facility] called requesting verbal orders for patient as follow for PT: 2X a week for 6 weeks and 1X a week for 2 weeks for balance and strength. Her call back number is [phone].

## 2019-05-11 NOTE — Telephone Encounter (Signed)
Requesting verbal order Social Work  1x wk for [employer]

## 2019-05-11 NOTE — Telephone Encounter (Signed)
Ligi notified to proceed with orders.  Nothing further needed.

## 2019-05-11 NOTE — Telephone Encounter (Signed)
Ok for verbal orders ?

## 2019-05-11 NOTE — Telephone Encounter (Signed)
Spoke to [facility] and she will proceed with orders.  Nothing further needed.

## 2019-05-12 NOTE — Telephone Encounter (Signed)
Ron notified to proceed with social work.

## 2019-05-13 ENCOUNTER — Telehealth: Payer: Self-pay | Admitting: Adult Health

## 2019-05-13 DIAGNOSIS — I872 Venous insufficiency (chronic) (peripheral): Secondary | ICD-10-CM | POA: Diagnosis not present

## 2019-05-13 DIAGNOSIS — M7061 Trochanteric bursitis, right hip: Secondary | ICD-10-CM | POA: Diagnosis not present

## 2019-05-13 DIAGNOSIS — W19XXXD Unspecified fall, subsequent encounter: Secondary | ICD-10-CM | POA: Diagnosis not present

## 2019-05-13 DIAGNOSIS — M1712 Unilateral primary osteoarthritis, left knee: Secondary | ICD-10-CM | POA: Diagnosis not present

## 2019-05-13 DIAGNOSIS — I10 Essential (primary) hypertension: Secondary | ICD-10-CM | POA: Diagnosis not present

## 2019-05-13 DIAGNOSIS — S32501D Unspecified fracture of right pubis, subsequent encounter for fracture with routine healing: Secondary | ICD-10-CM | POA: Diagnosis not present

## 2019-05-13 NOTE — Telephone Encounter (Signed)
Spoke with Ailene Ravel and advised that she proceed with orders for OT

## 2019-05-13 NOTE — Telephone Encounter (Signed)
Copied from [place]. Topic: Quick Communication - Home Health Verbal Orders >> May 13, 2019 12:27 PM Sheran Luz wrote: Caller/Agency: [facility] Number: [phone] Requesting OT Frequency: 1w1 and then hold until 6/1 for reassessment   VM Ok

## 2019-05-14 DIAGNOSIS — I10 Essential (primary) hypertension: Secondary | ICD-10-CM | POA: Diagnosis not present

## 2019-05-14 DIAGNOSIS — M1712 Unilateral primary osteoarthritis, left knee: Secondary | ICD-10-CM | POA: Diagnosis not present

## 2019-05-14 DIAGNOSIS — W19XXXD Unspecified fall, subsequent encounter: Secondary | ICD-10-CM | POA: Diagnosis not present

## 2019-05-14 DIAGNOSIS — I872 Venous insufficiency (chronic) (peripheral): Secondary | ICD-10-CM | POA: Diagnosis not present

## 2019-05-14 DIAGNOSIS — M7061 Trochanteric bursitis, right hip: Secondary | ICD-10-CM | POA: Diagnosis not present

## 2019-05-14 DIAGNOSIS — S32501D Unspecified fracture of right pubis, subsequent encounter for fracture with routine healing: Secondary | ICD-10-CM | POA: Diagnosis not present

## 2019-05-17 DIAGNOSIS — M7061 Trochanteric bursitis, right hip: Secondary | ICD-10-CM | POA: Diagnosis not present

## 2019-05-17 DIAGNOSIS — W19XXXD Unspecified fall, subsequent encounter: Secondary | ICD-10-CM | POA: Diagnosis not present

## 2019-05-17 DIAGNOSIS — I10 Essential (primary) hypertension: Secondary | ICD-10-CM | POA: Diagnosis not present

## 2019-05-17 DIAGNOSIS — S32501D Unspecified fracture of right pubis, subsequent encounter for fracture with routine healing: Secondary | ICD-10-CM | POA: Diagnosis not present

## 2019-05-17 DIAGNOSIS — M1712 Unilateral primary osteoarthritis, left knee: Secondary | ICD-10-CM | POA: Diagnosis not present

## 2019-05-17 DIAGNOSIS — I872 Venous insufficiency (chronic) (peripheral): Secondary | ICD-10-CM | POA: Diagnosis not present

## 2019-05-18 DIAGNOSIS — I10 Essential (primary) hypertension: Secondary | ICD-10-CM | POA: Diagnosis not present

## 2019-05-18 DIAGNOSIS — M1712 Unilateral primary osteoarthritis, left knee: Secondary | ICD-10-CM | POA: Diagnosis not present

## 2019-05-18 DIAGNOSIS — I872 Venous insufficiency (chronic) (peripheral): Secondary | ICD-10-CM | POA: Diagnosis not present

## 2019-05-18 DIAGNOSIS — S32501D Unspecified fracture of right pubis, subsequent encounter for fracture with routine healing: Secondary | ICD-10-CM | POA: Diagnosis not present

## 2019-05-18 DIAGNOSIS — W19XXXD Unspecified fall, subsequent encounter: Secondary | ICD-10-CM | POA: Diagnosis not present

## 2019-05-18 DIAGNOSIS — M7061 Trochanteric bursitis, right hip: Secondary | ICD-10-CM | POA: Diagnosis not present

## 2019-05-19 DIAGNOSIS — I872 Venous insufficiency (chronic) (peripheral): Secondary | ICD-10-CM | POA: Diagnosis not present

## 2019-05-19 DIAGNOSIS — I10 Essential (primary) hypertension: Secondary | ICD-10-CM | POA: Diagnosis not present

## 2019-05-19 DIAGNOSIS — M1712 Unilateral primary osteoarthritis, left knee: Secondary | ICD-10-CM | POA: Diagnosis not present

## 2019-05-19 DIAGNOSIS — S32501D Unspecified fracture of right pubis, subsequent encounter for fracture with routine healing: Secondary | ICD-10-CM | POA: Diagnosis not present

## 2019-05-19 DIAGNOSIS — M7061 Trochanteric bursitis, right hip: Secondary | ICD-10-CM | POA: Diagnosis not present

## 2019-05-19 DIAGNOSIS — W19XXXD Unspecified fall, subsequent encounter: Secondary | ICD-10-CM | POA: Diagnosis not present

## 2019-05-20 ENCOUNTER — Telehealth: Payer: Self-pay | Admitting: Adult Health

## 2019-05-20 DIAGNOSIS — S32501D Unspecified fracture of right pubis, subsequent encounter for fracture with routine healing: Secondary | ICD-10-CM | POA: Diagnosis not present

## 2019-05-20 DIAGNOSIS — I10 Essential (primary) hypertension: Secondary | ICD-10-CM | POA: Diagnosis not present

## 2019-05-20 DIAGNOSIS — W19XXXD Unspecified fall, subsequent encounter: Secondary | ICD-10-CM | POA: Diagnosis not present

## 2019-05-20 DIAGNOSIS — M7061 Trochanteric bursitis, right hip: Secondary | ICD-10-CM | POA: Diagnosis not present

## 2019-05-20 DIAGNOSIS — M1712 Unilateral primary osteoarthritis, left knee: Secondary | ICD-10-CM | POA: Diagnosis not present

## 2019-05-20 DIAGNOSIS — I872 Venous insufficiency (chronic) (peripheral): Secondary | ICD-10-CM | POA: Diagnosis not present

## 2019-05-20 NOTE — Telephone Encounter (Signed)
Spoke to the pt.  10 of the conversation was about her husband and finding a facility to live at.  Alicia Boyd will need memory care and she will go into assisted living.  She was unsure how to proceed but has accepted that moving to a facility will be best for the both of them.  She will find a facility that best suits their needs.  I gave her the office fax number so she can send over any necessary paper work for [facility] to review and sign.  She will call back as needed.  Assured her that we are here if she needs anything.

## 2019-05-20 NOTE — Telephone Encounter (Signed)
Tried reaching the pt.  No answer or machine.

## 2019-05-20 NOTE — Telephone Encounter (Signed)
Copied from [place] [id number]. Topic: General - Other >> May 20, 2019  8:09 AM Keene Breath wrote: Reason for CRM: Patient called to request that the nurse call her regarding her concerns about being assigned to [facility].  Patient has some questions about how this will affect her upcoming appts. With Dr. [name] and possibly if she needs to speak with Social Services.  Please advise and call patient back at [phone]

## 2019-05-21 ENCOUNTER — Telehealth: Payer: Self-pay | Admitting: Adult Health

## 2019-05-21 NOTE — Telephone Encounter (Signed)
Copied from [place] [phone]. Topic: Quick Communication - Home Health Verbal Orders >> May 21, 2019  3:09 PM Pauline Good wrote: Caller/Agency: Ron Taylor/[facility] Callback Number: [phone] Requesting OT/PT/Skilled Nursing/Social Work/Speech Therapy: Social work Frequency: need to move the social work visit from this week to next week

## 2019-05-25 DIAGNOSIS — S32501D Unspecified fracture of right pubis, subsequent encounter for fracture with routine healing: Secondary | ICD-10-CM | POA: Diagnosis not present

## 2019-05-25 DIAGNOSIS — W19XXXD Unspecified fall, subsequent encounter: Secondary | ICD-10-CM | POA: Diagnosis not present

## 2019-05-25 DIAGNOSIS — M1712 Unilateral primary osteoarthritis, left knee: Secondary | ICD-10-CM | POA: Diagnosis not present

## 2019-05-25 DIAGNOSIS — M7061 Trochanteric bursitis, right hip: Secondary | ICD-10-CM | POA: Diagnosis not present

## 2019-05-25 DIAGNOSIS — I872 Venous insufficiency (chronic) (peripheral): Secondary | ICD-10-CM | POA: Diagnosis not present

## 2019-05-25 DIAGNOSIS — I10 Essential (primary) hypertension: Secondary | ICD-10-CM | POA: Diagnosis not present

## 2019-05-25 NOTE — Telephone Encounter (Signed)
Left a message on detailed machine informing Ron to proceed with moving social work appointment.  Nothing further needed.

## 2019-05-26 DIAGNOSIS — M7061 Trochanteric bursitis, right hip: Secondary | ICD-10-CM | POA: Diagnosis not present

## 2019-05-26 DIAGNOSIS — I10 Essential (primary) hypertension: Secondary | ICD-10-CM | POA: Diagnosis not present

## 2019-05-26 DIAGNOSIS — M1712 Unilateral primary osteoarthritis, left knee: Secondary | ICD-10-CM | POA: Diagnosis not present

## 2019-05-26 DIAGNOSIS — I872 Venous insufficiency (chronic) (peripheral): Secondary | ICD-10-CM | POA: Diagnosis not present

## 2019-05-26 DIAGNOSIS — W19XXXD Unspecified fall, subsequent encounter: Secondary | ICD-10-CM | POA: Diagnosis not present

## 2019-05-26 DIAGNOSIS — S32501D Unspecified fracture of right pubis, subsequent encounter for fracture with routine healing: Secondary | ICD-10-CM | POA: Diagnosis not present

## 2019-05-27 ENCOUNTER — Ambulatory Visit (INDEPENDENT_AMBULATORY_CARE_PROVIDER_SITE_OTHER): Payer: MEDICARE | Admitting: Family Medicine

## 2019-05-27 ENCOUNTER — Encounter: Payer: Self-pay | Admitting: Family Medicine

## 2019-05-27 ENCOUNTER — Ambulatory Visit (INDEPENDENT_AMBULATORY_CARE_PROVIDER_SITE_OTHER)
Admission: RE | Admit: 2019-05-27 | Discharge: 2019-05-27 | Disposition: A | Discharge status: Home / Self Care | Payer: MEDICARE | Source: Ambulatory Visit | Attending: Family Medicine | Admitting: Family Medicine

## 2019-05-27 ENCOUNTER — Other Ambulatory Visit: Payer: Self-pay

## 2019-05-27 DIAGNOSIS — S32591A Other specified fracture of right pubis, initial encounter for closed fracture: Secondary | ICD-10-CM | POA: Diagnosis not present

## 2019-05-27 DIAGNOSIS — M25551 Pain in right hip: Secondary | ICD-10-CM

## 2019-05-27 NOTE — Patient Instructions (Signed)
Good to see you  Heat to side of hip 10 minutes 3-4 times a day  Tylenol 3 times daily  Vitamin D 2000IU 2 times a day  I am ok with you continue therapy just stop if cause pain  See me again in 4 weeks.

## 2019-05-27 NOTE — Progress Notes (Signed)
[name] [place], [place] [address] Phone: [phone] Subjective:   I Alicia Boyd am serving as a Education administrator for Dr. Hulan Saas.  I'm seeing this patient by the request  of:    CC: Pelvic pain  [id number]  Alicia Boyd is a 85 y.o. female coming in with complaint of hip and pelvic pain. Fell on her right hip. Has brusing.   Onset- May 6th Location- hip and pelvic pain Duration-  Character- throbbing  Aggravating factors- moving her legs is painful  Reliving factors-  Therapies tried- topical, bio freeze patient has had home health coming occasionally. Severity-9 out of 10 initially now improving slowly.   Patient did have new x-rays taken today.  New x-rays were independently visualized by me.  Compared to patient's previous x-rays 3 weeks ago patient has had minimal healing done.  Patient also has what appears to be a superior in addition to the inferior pubic rami fractures.  Superior is nondisplaced inferior is minimally displaced.  Past Medical History:  Diagnosis Date  . Aortic heart murmur    AS/AI on 2D ECHO ; SBE prophylaxis Rxed  . Chest pain 2007 ; 2011   negative cardiac assessment  . Hyperlipidemia   . Interstitial cystitis    Resolved  . Osteoporosis    T score - 3.0 @ femoral neck; Fosamax affected swallowing  . Postmenopausal    No PMH or HRT   Past Surgical History:  Procedure Laterality Date  . COLONOSCOPY  2011   Dr Sharlett Iles   Social History   Socioeconomic History  . Marital status: Married    Spouse name: Not on file  . Number of children: Not on file  . Years of education: Not on file  . Highest education level: Not on file  Occupational History  . Not on file  Social Needs  . Financial resource strain: Not on file  . Food insecurity:    Worry: Not on file    Inability: Not on file  . Transportation needs:    Medical: Not on file    Non-medical: Not on file  Tobacco Use  .  Smoking status: Never Smoker  . Smokeless tobacco: Never Used  Substance and Sexual Activity  . Alcohol use: No  . Drug use: No  . Sexual activity: Not on file  Lifestyle  . Physical activity:    Days per week: Not on file    Minutes per session: Not on file  . Stress: Not on file  Relationships  . Social connections:    Talks on phone: Not on file    Gets together: Not on file    Attends religious service: Not on file    Active member of club or organization: Not on file    Attends meetings of clubs or organizations: Not on file    Relationship status: Not on file  Other Topics Concern  . Not on file  Social History Narrative  . Not on file   Allergies  Allergen Reactions  . Amitid [Amitriptyline Hcl]     Patient experienced a dizzy, spinning feeling   . Metronidazole     REACTION: throat swells  . Penicillins     REACTION: rash & fever  . Alendronate Sodium     REACTION: unspecified  . Clarithromycin     GI intolerance  . Flonase [Fluticasone Propionate]     Slight Headache   . Promethazine Hcl  REACTION: unspecified  . Robitussin (Alcohol Free) [Guaifenesin] Other (See Comments)    unknown hyperactive  . Spironolactone     09/21/13 weakness in legs  . Singulair [Montelukast Sodium]     "spacey"; ineffective   Family History  Problem Relation Age of Onset  . Diabetes Father        Pancreatitic insufficiency post MVA  . Hypertension Mother   . Osteoporosis Mother   . Heart disease Mother        AF  . Thyroid disease Sister        hypothyroidism  . Coronary artery disease Sister        AF  . Stroke Sister 78       also pulmonary disease     Current Facility-Administered Medications (Endocrine & Metabolic):  .  methylPREDNISolone acetate (DEPO-MEDROL) injection 40 mg  Current Outpatient Medications (Cardiovascular):  .  amLODipine (NORVASC) 5 MG tablet, TAKE 1 TABLET BY MOUTH EVERY DAY .  metoprolol succinate (TOPROL-XL) 25 MG 24 hr tablet, TAKE  1 TABLET BY MOUTH EVERY DAY   Current Outpatient Medications (Respiratory):  .  fexofenadine (ALLEGRA) 180 MG tablet, Take 180 mg by mouth daily as needed.  Marland Kitchen  ipratropium (ATROVENT) 0.03 % nasal spray, USE 2 SPRAYS IN EACH NOSTRIL EVERY 12 HOURS. DO NOT USE MORE THAN 5 DAYS   Current Outpatient Medications (Analgesics):  .  traMADol (ULTRAM) 50 MG tablet, Take 1 tablet (50 mg total) by mouth every 6 (six) hours as needed.     Current Outpatient Medications (Other):  Marland Kitchen  Calcium Carbonate (CALCIUM 600 PO), Take by mouth daily. .  Cholecalciferol (VITAMIN D3) 1000 UNITS CAPS, Take by mouth daily. Marland Kitchen  gabapentin (NEURONTIN) 100 MG capsule, Take 1 capsule (100 mg total) by mouth at bedtime. .  Multiple Vitamin (MULTIVITAMIN) capsule, Take 1 capsule by mouth daily.   Vladimir Faster Glycol-Propyl Glycol (SYSTANE OP), Apply to eye.     Past medical history, social, surgical and family history all reviewed in electronic medical record.  No pertanent information unless stated regarding to the chief complaint.   Review of Systems:  No headache, visual changes, nausea, vomiting, diarrhea, constipation, dizziness, abdominal pain, skin rash, fevers, chills, night sweats, weight loss, swollen lymph nodes, body aches, joint swelling,  chest pain, shortness of breath, mood changes.  Positive muscle aches  Objective  Blood pressure 110/64, pulse 73, height 5\' 4"  (1.626 m), weight 130 lb (59 kg), SpO2 96 %.   General: No apparent distress alert and oriented x3 mood and affect normal, dressed appropriately.  Cachectic minorly confused HEENT: Pupils equal, extraocular movements intact  Respiratory: Patient's speak in full sentences and does not appear short of breath  Cardiovascular: Trace lower extremity edema, non tender, no erythema  Skin: Warm dry intact with no signs of infection or rash on extremities or on axial skeleton.  Abdomen: Soft nontender  Neuro: Cranial nerves II through XII are  intact, neurovascularly intact in all extremities with 2+ DTRs and 2+ pulses.  Lymph: No lymphadenopathy of posterior or anterior cervical chain or axillae bilaterally.  Gait patient is in a wheelchair MSK: Arthritic changes of multiple joints Patient's right hip patient was able to lift it without any significant difficulty while in a seated position.  Patient was femur minorly uncomfortable.  Negative fulcrum test.  severely tender over the pubic rami Patient does have bruising on the lateral aspect of the hip.    Impression and Recommendations:  This case required medical decision making of moderate complexity. The above documentation has been reviewed and is accurate and complete Lyndal Pulley, DO       Note: This dictation was prepared with Dragon dictation along with smaller phrase technology. Any transcriptional errors that result from this process are unintentional.

## 2019-05-28 ENCOUNTER — Ambulatory Visit: Payer: MEDICARE | Admitting: Family Medicine

## 2019-05-28 ENCOUNTER — Encounter: Payer: Self-pay | Admitting: Family Medicine

## 2019-05-28 DIAGNOSIS — M7061 Trochanteric bursitis, right hip: Secondary | ICD-10-CM | POA: Diagnosis not present

## 2019-05-28 DIAGNOSIS — I10 Essential (primary) hypertension: Secondary | ICD-10-CM | POA: Diagnosis not present

## 2019-05-28 DIAGNOSIS — I872 Venous insufficiency (chronic) (peripheral): Secondary | ICD-10-CM | POA: Diagnosis not present

## 2019-05-28 DIAGNOSIS — M1712 Unilateral primary osteoarthritis, left knee: Secondary | ICD-10-CM | POA: Diagnosis not present

## 2019-05-28 DIAGNOSIS — S32591A Other specified fracture of right pubis, initial encounter for closed fracture: Secondary | ICD-10-CM | POA: Insufficient documentation

## 2019-05-28 DIAGNOSIS — S32501D Unspecified fracture of right pubis, subsequent encounter for fracture with routine healing: Secondary | ICD-10-CM | POA: Diagnosis not present

## 2019-05-28 DIAGNOSIS — W19XXXD Unspecified fall, subsequent encounter: Secondary | ICD-10-CM | POA: Diagnosis not present

## 2019-05-28 NOTE — Assessment & Plan Note (Signed)
Fracture noted.  Now it appears that patient did have 2.  Patient is actually has fairly good range of motion and strength.  Neurovascular intact distally.  Patient is taking gabapentin at night.  Discussed with patient about pain medications.  Encourage patient not to be the primary caregiver for her end-stage dementia husband at this time.  Physical therapy.  Discussed importance of vitamin D.  Follow-up with me again in 4 to 6 weeks

## 2019-05-29 DIAGNOSIS — M1712 Unilateral primary osteoarthritis, left knee: Secondary | ICD-10-CM | POA: Diagnosis not present

## 2019-05-29 DIAGNOSIS — I10 Essential (primary) hypertension: Secondary | ICD-10-CM | POA: Diagnosis not present

## 2019-05-29 DIAGNOSIS — S32501D Unspecified fracture of right pubis, subsequent encounter for fracture with routine healing: Secondary | ICD-10-CM | POA: Diagnosis not present

## 2019-05-29 DIAGNOSIS — I872 Venous insufficiency (chronic) (peripheral): Secondary | ICD-10-CM | POA: Diagnosis not present

## 2019-05-29 DIAGNOSIS — M7061 Trochanteric bursitis, right hip: Secondary | ICD-10-CM | POA: Diagnosis not present

## 2019-05-29 DIAGNOSIS — W19XXXD Unspecified fall, subsequent encounter: Secondary | ICD-10-CM | POA: Diagnosis not present

## 2019-05-31 DIAGNOSIS — M7061 Trochanteric bursitis, right hip: Secondary | ICD-10-CM | POA: Diagnosis not present

## 2019-05-31 DIAGNOSIS — I872 Venous insufficiency (chronic) (peripheral): Secondary | ICD-10-CM | POA: Diagnosis not present

## 2019-05-31 DIAGNOSIS — M1712 Unilateral primary osteoarthritis, left knee: Secondary | ICD-10-CM | POA: Diagnosis not present

## 2019-05-31 DIAGNOSIS — I10 Essential (primary) hypertension: Secondary | ICD-10-CM | POA: Diagnosis not present

## 2019-05-31 DIAGNOSIS — S32501D Unspecified fracture of right pubis, subsequent encounter for fracture with routine healing: Secondary | ICD-10-CM | POA: Diagnosis not present

## 2019-05-31 DIAGNOSIS — W19XXXD Unspecified fall, subsequent encounter: Secondary | ICD-10-CM | POA: Diagnosis not present

## 2019-06-01 ENCOUNTER — Telehealth: Payer: Self-pay | Admitting: *Deleted

## 2019-06-01 ENCOUNTER — Ambulatory Visit: Payer: Self-pay | Admitting: Adult Health

## 2019-06-01 NOTE — Telephone Encounter (Signed)
Copied from [place] [id number]. Topic: General - Call Back - No Documentation >> Jun 01, 2019  1:57 PM Erick Blinks wrote: Reason for CRM: Pt called requesting Fl2 forms be faxed over to [employer] Fax: [phone]

## 2019-06-02 NOTE — Telephone Encounter (Signed)
FL2 Forms faxed and I have received confirmation the transmission was successful.  Nothing further required in this message.  Will close.

## 2019-06-03 DIAGNOSIS — W19XXXD Unspecified fall, subsequent encounter: Secondary | ICD-10-CM | POA: Diagnosis not present

## 2019-06-03 DIAGNOSIS — S32501D Unspecified fracture of right pubis, subsequent encounter for fracture with routine healing: Secondary | ICD-10-CM | POA: Diagnosis not present

## 2019-06-03 DIAGNOSIS — M1712 Unilateral primary osteoarthritis, left knee: Secondary | ICD-10-CM | POA: Diagnosis not present

## 2019-06-03 DIAGNOSIS — I10 Essential (primary) hypertension: Secondary | ICD-10-CM | POA: Diagnosis not present

## 2019-06-03 DIAGNOSIS — I872 Venous insufficiency (chronic) (peripheral): Secondary | ICD-10-CM | POA: Diagnosis not present

## 2019-06-03 DIAGNOSIS — M7061 Trochanteric bursitis, right hip: Secondary | ICD-10-CM | POA: Diagnosis not present

## 2019-06-04 ENCOUNTER — Telehealth: Payer: Self-pay | Admitting: Adult Health

## 2019-06-04 NOTE — Telephone Encounter (Signed)
Please see message . Thank you .

## 2019-06-04 NOTE — Telephone Encounter (Signed)
Ok to d/c social work

## 2019-06-04 NOTE — Telephone Encounter (Signed)
I spoke with Ron and verbally informed him to d/c of social work for patient, per [employer].  Ron verbalized understanding.

## 2019-06-04 NOTE — Telephone Encounter (Signed)
Copied from [place] [id number]. Topic: Quick Communication - Home Health Verbal Orders >> Jun 04, 2019 12:09 PM Rayann Heman wrote: Caller/Agency: Ron/[facility]  Callback Number:[phone] Requesting Discontinue social work

## 2019-06-08 DIAGNOSIS — M810 Age-related osteoporosis without current pathological fracture: Secondary | ICD-10-CM | POA: Diagnosis not present

## 2019-06-08 DIAGNOSIS — I10 Essential (primary) hypertension: Secondary | ICD-10-CM | POA: Diagnosis not present

## 2019-06-08 DIAGNOSIS — M7061 Trochanteric bursitis, right hip: Secondary | ICD-10-CM | POA: Diagnosis not present

## 2019-06-08 DIAGNOSIS — D649 Anemia, unspecified: Secondary | ICD-10-CM | POA: Diagnosis not present

## 2019-06-08 DIAGNOSIS — S32501D Unspecified fracture of right pubis, subsequent encounter for fracture with routine healing: Secondary | ICD-10-CM | POA: Diagnosis not present

## 2019-06-08 DIAGNOSIS — I872 Venous insufficiency (chronic) (peripheral): Secondary | ICD-10-CM | POA: Diagnosis not present

## 2019-06-08 DIAGNOSIS — M1712 Unilateral primary osteoarthritis, left knee: Secondary | ICD-10-CM | POA: Diagnosis not present

## 2019-06-08 DIAGNOSIS — W19XXXD Unspecified fall, subsequent encounter: Secondary | ICD-10-CM | POA: Diagnosis not present

## 2019-06-08 DIAGNOSIS — Z9181 History of falling: Secondary | ICD-10-CM | POA: Diagnosis not present

## 2019-06-08 DIAGNOSIS — K219 Gastro-esophageal reflux disease without esophagitis: Secondary | ICD-10-CM | POA: Diagnosis not present

## 2019-06-12 DIAGNOSIS — I872 Venous insufficiency (chronic) (peripheral): Secondary | ICD-10-CM | POA: Diagnosis not present

## 2019-06-12 DIAGNOSIS — W19XXXD Unspecified fall, subsequent encounter: Secondary | ICD-10-CM | POA: Diagnosis not present

## 2019-06-12 DIAGNOSIS — M7061 Trochanteric bursitis, right hip: Secondary | ICD-10-CM | POA: Diagnosis not present

## 2019-06-12 DIAGNOSIS — M1712 Unilateral primary osteoarthritis, left knee: Secondary | ICD-10-CM | POA: Diagnosis not present

## 2019-06-12 DIAGNOSIS — S32501D Unspecified fracture of right pubis, subsequent encounter for fracture with routine healing: Secondary | ICD-10-CM | POA: Diagnosis not present

## 2019-06-12 DIAGNOSIS — I10 Essential (primary) hypertension: Secondary | ICD-10-CM | POA: Diagnosis not present

## 2019-06-15 DIAGNOSIS — W19XXXD Unspecified fall, subsequent encounter: Secondary | ICD-10-CM | POA: Diagnosis not present

## 2019-06-15 DIAGNOSIS — I10 Essential (primary) hypertension: Secondary | ICD-10-CM | POA: Diagnosis not present

## 2019-06-15 DIAGNOSIS — I872 Venous insufficiency (chronic) (peripheral): Secondary | ICD-10-CM | POA: Diagnosis not present

## 2019-06-15 DIAGNOSIS — S32501D Unspecified fracture of right pubis, subsequent encounter for fracture with routine healing: Secondary | ICD-10-CM | POA: Diagnosis not present

## 2019-06-15 DIAGNOSIS — M7061 Trochanteric bursitis, right hip: Secondary | ICD-10-CM | POA: Diagnosis not present

## 2019-06-15 DIAGNOSIS — M1712 Unilateral primary osteoarthritis, left knee: Secondary | ICD-10-CM | POA: Diagnosis not present

## 2019-06-18 DIAGNOSIS — I10 Essential (primary) hypertension: Secondary | ICD-10-CM | POA: Diagnosis not present

## 2019-06-18 DIAGNOSIS — I872 Venous insufficiency (chronic) (peripheral): Secondary | ICD-10-CM | POA: Diagnosis not present

## 2019-06-18 DIAGNOSIS — M1712 Unilateral primary osteoarthritis, left knee: Secondary | ICD-10-CM | POA: Diagnosis not present

## 2019-06-18 DIAGNOSIS — S32501D Unspecified fracture of right pubis, subsequent encounter for fracture with routine healing: Secondary | ICD-10-CM | POA: Diagnosis not present

## 2019-06-18 DIAGNOSIS — W19XXXD Unspecified fall, subsequent encounter: Secondary | ICD-10-CM | POA: Diagnosis not present

## 2019-06-18 DIAGNOSIS — M7061 Trochanteric bursitis, right hip: Secondary | ICD-10-CM | POA: Diagnosis not present

## 2019-06-21 ENCOUNTER — Telehealth: Payer: Self-pay | Admitting: Family Medicine

## 2019-06-21 NOTE — Telephone Encounter (Signed)
Patient states Dr. Tamala Julian is treating her hip.  Her other hip has started hurting and would like to get worked in soon.  Please follow up in regard.

## 2019-06-21 NOTE — Telephone Encounter (Signed)
Pt scheduled for 6/24 @ 3pm.

## 2019-06-22 ENCOUNTER — Ambulatory Visit (INDEPENDENT_AMBULATORY_CARE_PROVIDER_SITE_OTHER)
Admission: RE | Admit: 2019-06-22 | Discharge: 2019-06-22 | Disposition: A | Discharge status: Home / Self Care | Payer: MEDICARE | Source: Ambulatory Visit | Attending: Family Medicine | Admitting: Family Medicine

## 2019-06-22 ENCOUNTER — Ambulatory Visit (INDEPENDENT_AMBULATORY_CARE_PROVIDER_SITE_OTHER): Payer: MEDICARE | Admitting: Family Medicine

## 2019-06-22 ENCOUNTER — Other Ambulatory Visit: Payer: Self-pay

## 2019-06-22 ENCOUNTER — Encounter: Payer: Self-pay | Admitting: Family Medicine

## 2019-06-22 VITALS — BP 140/70 | HR 82 | Ht 64.0 in

## 2019-06-22 DIAGNOSIS — M25552 Pain in left hip: Secondary | ICD-10-CM

## 2019-06-22 DIAGNOSIS — S32591A Other specified fracture of right pubis, initial encounter for closed fracture: Secondary | ICD-10-CM | POA: Diagnosis not present

## 2019-06-22 DIAGNOSIS — M7062 Trochanteric bursitis, left hip: Secondary | ICD-10-CM | POA: Diagnosis not present

## 2019-06-22 DIAGNOSIS — S79912A Unspecified injury of left hip, initial encounter: Secondary | ICD-10-CM | POA: Diagnosis not present

## 2019-06-22 MED ORDER — GABAPENTIN 100 MG PO CAPS: 100.0000 mg | ORAL_CAPSULE | Freq: Every day | ORAL | 3 refills | Status: DC

## 2019-06-22 NOTE — Assessment & Plan Note (Signed)
Patient given injection today.  Tolerated the procedure well.  Discussed icing regimen and home exercises.  Discussed which activities to do which wants to avoid.  Patient likely compensating secondary to the pelvic fracture on the contralateral side.  Hopefully patient will continued healing well which will then stop her putting more pressure on the left side of the hip.

## 2019-06-22 NOTE — Progress Notes (Signed)
[name] [place], [place] [address] Phone: [phone] Subjective:   I Alicia Boyd am serving as a Education administrator for Dr. Hulan Saas.   CC: New onset left hip pain  [id number]  Alicia Boyd is a 85 y.o. female coming in with complaint of left hip pain. Deep hip pain. Seems piriformis in nature. Can't sleep at night.   Onset- Monday  Location  lateralaspect of left hip  duration-  Character-aching sensation is constant Aggravating factors- stairs  Reliving factors-nothing seems to be helping it. Therapies tried- Tylenol Severity-9 out of 10 Patient is being treated for right-sided pelvic fracture.    Past Medical History:  Diagnosis Date  . Aortic heart murmur    AS/AI on 2D ECHO ; SBE prophylaxis Rxed  . Chest pain 2007 ; 2011   negative cardiac assessment  . Hyperlipidemia   . Interstitial cystitis    Resolved  . Osteoporosis    T score - 3.0 @ femoral neck; Fosamax affected swallowing  . Postmenopausal    No PMH or HRT   Past Surgical History:  Procedure Laterality Date  . COLONOSCOPY  2011   Dr Sharlett Iles   Social History   Socioeconomic History  . Marital status: Married    Spouse name: Not on file  . Number of children: Not on file  . Years of education: Not on file  . Highest education level: Not on file  Occupational History  . Not on file  Social Needs  . Financial resource strain: Not on file  . Food insecurity    Worry: Not on file    Inability: Not on file  . Transportation needs    Medical: Not on file    Non-medical: Not on file  Tobacco Use  . Smoking status: Never Smoker  . Smokeless tobacco: Never Used  Substance and Sexual Activity  . Alcohol use: No  . Drug use: No  . Sexual activity: Not on file  Lifestyle  . Physical activity    Days per week: Not on file    Minutes per session: Not on file  . Stress: Not on file  Relationships  . Social Herbalist on phone: Not on  file    Gets together: Not on file    Attends religious service: Not on file    Active member of club or organization: Not on file    Attends meetings of clubs or organizations: Not on file    Relationship status: Not on file  Other Topics Concern  . Not on file  Social History Narrative  . Not on file   Allergies  Allergen Reactions  . Amitid [Amitriptyline Hcl]     Patient experienced a dizzy, spinning feeling   . Metronidazole     REACTION: throat swells  . Penicillins     REACTION: rash & fever  . Alendronate Sodium     REACTION: unspecified  . Clarithromycin     GI intolerance  . Flonase [Fluticasone Propionate]     Slight Headache   . Promethazine Hcl     REACTION: unspecified  . Robitussin (Alcohol Free) [Guaifenesin] Other (See Comments)    unknown hyperactive  . Spironolactone     09/21/13 weakness in legs  . Singulair [Montelukast Sodium]     "spacey"; ineffective   Family History  Problem Relation Age of Onset  . Diabetes Father        Pancreatitic insufficiency post  MVA  . Hypertension Mother   . Osteoporosis Mother   . Heart disease Mother        AF  . Thyroid disease Sister        hypothyroidism  . Coronary artery disease Sister        AF  . Stroke Sister 62       also pulmonary disease     Current Facility-Administered Medications (Endocrine & Metabolic):  .  methylPREDNISolone acetate (DEPO-MEDROL) injection 40 mg  Current Outpatient Medications (Cardiovascular):  .  amLODipine (NORVASC) 5 MG tablet, TAKE 1 TABLET BY MOUTH EVERY DAY .  metoprolol succinate (TOPROL-XL) 25 MG 24 hr tablet, TAKE 1 TABLET BY MOUTH EVERY DAY   Current Outpatient Medications (Respiratory):  .  fexofenadine (ALLEGRA) 180 MG tablet, Take 180 mg by mouth daily as needed.  Marland Kitchen  ipratropium (ATROVENT) 0.03 % nasal spray, USE 2 SPRAYS IN EACH NOSTRIL EVERY 12 HOURS. DO NOT USE MORE THAN 5 DAYS   Current Outpatient Medications (Analgesics):  .  traMADol (ULTRAM) 50 MG  tablet, Take 1 tablet (50 mg total) by mouth every 6 (six) hours as needed.     Current Outpatient Medications (Other):  Marland Kitchen  Calcium Carbonate (CALCIUM 600 PO), Take by mouth daily. .  Cholecalciferol (VITAMIN D3) 1000 UNITS CAPS, Take by mouth daily. Marland Kitchen  gabapentin (NEURONTIN) 100 MG capsule, Take 1 capsule (100 mg total) by mouth at bedtime. .  Multiple Vitamin (MULTIVITAMIN) capsule, Take 1 capsule by mouth daily.   Vladimir Faster Glycol-Propyl Glycol (SYSTANE OP), Apply to eye.     Past medical history, social, surgical and family history all reviewed in electronic medical record.  No pertanent information unless stated regarding to the chief complaint.   Review of Systems:  No headache, visual changes, nausea, vomiting, diarrhea, constipation, dizziness, abdominal pain, skin rash, fevers, chills, night sweats, weight loss, swollen lymph nodes, body aches, joint swelling,  chest pain, shortness of breath, mood changes.  Positive muscle aches  Objective  Blood pressure 140/70, pulse 82, height 5\' 4"  (1.626 m), SpO2 97 %.    General: Patient appears to be very uncomfortable.  Alert and oriented x3 HEENT: Pupils equal, extraocular movements intact  Respiratory: Patient's speak in full sentences and does not appear short of breath  Cardiovascular: Trace lower extremity edema, non tender, no erythema  Skin: Warm dry intact with no signs of infection or rash on extremities or on axial skeleton.  Abdomen: Soft nontender .  Gait antalgic walking with the aid of a walker MSK: Patient's right hip still tender to palpation over the pelvic bone.  Negative straight leg test. Patient has severe tenderness to palpation over the lateral greater trochanteric area.  Mild pain over the piriformis.  This is on the left side of the hip.  Negative straight leg test.  Negative fulcrum test.  After verbal consent patient prepped with alcohol swabs and with a 21-gauge 2 inch needle injected into the left  greater trochanteric area with a total of 1 cc of 0.5% Marcaine and 1 cc of Kenalog 40 mg/mL.  No blood loss.  Postinjection instructions given.    Impression and Recommendations:     This case required medical decision making of moderate complexity. The above documentation has been reviewed and is accurate and complete Lyndal Pulley, DO       Note: This dictation was prepared with Dragon dictation along with smaller phrase technology. Any transcriptional errors that result from this process  are unintentional.

## 2019-06-22 NOTE — Assessment & Plan Note (Signed)
Patient is getting repeat x-rays.  Seems to be doing relatively well.  I do believe patient was compensating for this causing more pain on the left side of the hip.  Likely giving her more of the greater trochanteric bursitis.  We will see how patient responds to the injection but patient will follow-up with me again in 2 weeks

## 2019-06-22 NOTE — Patient Instructions (Addendum)
Good to see you  Ice is your friend  Gabapentin 200mg  at night  Xray downstairs If pain is worsenign then take the tramadol with a tylenol  See me again in 2 weeks

## 2019-06-22 NOTE — Telephone Encounter (Signed)
Pt called and stated that she is in pain. Pt states that he pain level is a 10. Pt wanted to know if anything has been canceled or If she can be worked in. Please advise   [phone]

## 2019-06-23 ENCOUNTER — Other Ambulatory Visit: Payer: Self-pay | Admitting: *Deleted

## 2019-06-23 ENCOUNTER — Ambulatory Visit: Payer: MEDICARE | Admitting: Family Medicine

## 2019-06-24 ENCOUNTER — Telehealth: Payer: Self-pay | Admitting: Adult Health

## 2019-06-24 NOTE — Telephone Encounter (Signed)
Home Health Verbal Orders - Caller/Agency: [facility] Number: [phone] [facility] Requesting OT/PT/Skilled Nursing/Social Work/Speech Therapy: Postpone PT Frequency: The patient was not feeling well today and wants to reschedule until next week. The visit from today will be added to the end of the episode. Please call back to confirm and give permission.

## 2019-06-25 NOTE — Telephone Encounter (Signed)
Spoke to [place] and gave permission to wait until next week for appointment. Liji stated pt was having pain in her hip and leg so Liji was giving her "a break" this week.  Nothing further needed.

## 2019-06-28 ENCOUNTER — Ambulatory Visit: Payer: MEDICARE | Admitting: Family Medicine

## 2019-06-30 ENCOUNTER — Ambulatory Visit: Payer: Self-pay | Admitting: *Deleted

## 2019-06-30 ENCOUNTER — Other Ambulatory Visit: Payer: Self-pay

## 2019-06-30 ENCOUNTER — Telehealth: Payer: Self-pay | Admitting: Adult Health

## 2019-06-30 ENCOUNTER — Encounter: Payer: Self-pay | Admitting: Adult Health

## 2019-06-30 ENCOUNTER — Ambulatory Visit (INDEPENDENT_AMBULATORY_CARE_PROVIDER_SITE_OTHER): Payer: MEDICARE | Admitting: Adult Health

## 2019-06-30 DIAGNOSIS — F419 Anxiety disorder, unspecified: Secondary | ICD-10-CM

## 2019-06-30 DIAGNOSIS — F329 Major depressive disorder, single episode, unspecified: Secondary | ICD-10-CM

## 2019-06-30 NOTE — Telephone Encounter (Signed)
Home Health Verbal Orders - Caller/Agency: [place] Number: [phone] Requesting OT/PT/Skilled Nursing/Social Work/Speech Therapy: Physical Therapy Frequency:   Requesting that the missed visit from last week be moved to next week. Discharged next week also.

## 2019-06-30 NOTE — Telephone Encounter (Signed)
Pt scheduled with PCP

## 2019-06-30 NOTE — Progress Notes (Signed)
Virtual Visit via Telephone Note  I connected with Alicia Boyd on 06/30/19 at  4:00 PM EDT by telephone and verified that I am speaking with the correct person using two identifiers.   I discussed the limitations, risks, security and privacy concerns of performing an evaluation and management service by telephone and the availability of in person appointments. I also discussed with the patient that there may be a patient responsible charge related to this service. The patient expressed understanding and agreed to proceed.  Location patient: home Location provider: work or home office Participants present for the call: patient, provider Patient did not have a visit in the prior 7 days to address this/these issue(s).   History of Present Illness: 85 year old female who is being evaluated today for anxiety and depression.  She reports that her husband was placed in a memory care unit few weeks ago and her oldest sister passed away this last week.  She is having trouble sleeping at night due to living in an empty house now feels as though she is slightly depressed and her anxiety has increased.  She is nervous about taking any medication.     Observations/Objective: Patient sounds cheerful and well on the phone. I do not appreciate any SOB. Speech and thought processing are grossly intact. Patient reported vitals:  Assessment and Plan: 1. Anxiety and depression -Talked at length about anxiety and depression with what she has been experiencing over the last month this is to be somewhat expected.  We discussed medications but she would like to forego this time.  We talked about holistic modalities such as deep breathing exercises, meditation, and staying active.  She would like to do the holistic approach first.  She will follow-up with me in 2 weeks or sooner if needed   Follow Up Instructions:   I did not refer this patient for an OV in the next 24 hours for this/these issue(s).  I  discussed the assessment and treatment plan with the patient. The patient was provided an opportunity to ask questions and all were answered. The patient agreed with the plan and demonstrated an understanding of the instructions.   The patient was advised to call back or seek an in-person evaluation if the symptoms worsen or if the condition fails to improve as anticipated.  I provided 25 minutes of non-face-to-face time during this encounter.   [name], NP

## 2019-06-30 NOTE — Telephone Encounter (Signed)
Summary: fainting spells/heart flipflops   Patient called stating she is having fainting spells for "quite awhile" and also she feels like heart flip flops. She did say her husband is now at a nursing home and she just lost her sister so her heart "flip flopping" could be related. I waited for triage for 14 mins and also called office and told me she had to be triaged. My team lead advised me to make a clinical call and to advise her to go to urgent care, patient stated she was not going to go to urgent care and to take her chances. Patient is waiting for a call back  [phone]      Patient is under a lot of stress- she has not been monitoring BP/P. Patient is not sleeping well at this time- she is adjusting to living alone. Patient states she only fainted once when she hurt her hip.Things have not been going well since then. Patient has had to put her husband in nursing home and she has recently lost her sister. The funeral is today.  Patient lives in a community setting where she does have community support- but close family support is hard to find because they are out of town. Patient can not afford in home care due to new expanses and she is planning to sell her home and move to smaller setting.  Patient does have a neighbor who is a PA and she is going to have her come over tonight to check her BP/P and heart. She is very sad and sleep deprived. Discussed having someone to stay with her while she tries new medication to see if she can tolerate it and she agrees. She really wants an appointment with her PCP. Call to the office to see if she can be scheduled for an appointment as she has not been seen since she has been having this problems.  Reason for Disposition . [1] Depression AND [2] worsening (e.g.,sleeping poorly, less able to do activities of daily living)  Answer Assessment - Initial Assessment Questions 1. CONCERN: "What happened that made you call today?"     Patient is calling  for symptoms she has been having- history of flip flopping heart, loss of sleep , sadness 2. DEPRESSION SYMPTOM SCREENING: "How are you feeling overall?" (e.g., decreased energy, increased sleeping or difficulty sleeping, difficulty concentrating, feelings of sadness, guilt, hopelessness, or worthlessness)     Decreased sleep, sadness, alone 3. RISK OF HARM - SUICIDAL IDEATION:  "Do you ever have thoughts of hurting or killing yourself?"  (e.g., yes, no, no but preoccupation with thoughts about death)   - INTENT:  "Do you have thoughts of hurting or killing yourself right NOW?" (e.g., yes, no, N/A)   - PLAN: "Do you have a specific plan for how you would do this?" (e.g., gun, knife, overdose, no plan, N/A)     no 4. RISK OF HARM - HOMICIDAL IDEATION:  "Do you ever have thoughts of hurting or killing someone else?"  (e.g., yes, no, no but preoccupation with thoughts about death)   - INTENT:  "Do you have thoughts of hurting or killing someone right NOW?" (e.g., yes, no, N/A)   - PLAN: "Do you have a specific plan for how you would do this?" (e.g., gun, knife, no plan, N/A)      no 5. FUNCTIONAL IMPAIRMENT: "How have things been going for you overall in your life? Have you had any more difficulties than usual doing your normal daily  activities?"  (e.g., better, same, worse; self-care, school, work, interactions)     Patient is having a hard times now- recent changes- see note 6. SUPPORT: "Who is with you now?" "Who do you live with?" "Do you have family or friends nearby who you can talk to?"      Patient is living alone- she has neighbors who check on her 21. THERAPIST: "Do you have a counselor or therapist? Name?"     no 8. STRESSORS: "Has there been any new stress or recent changes in your life?"     Yes- injury- hip, loss of sister, recent admission of husband to nursing home 9. DRUG ABUSE/ALCOHOL: "Do you drink alcohol or use any illegal drugs?"      no 10. OTHER: "Do you have any other health  or medical symptoms right now?" (e.g., fever)       Hip injury 11. PREGNANCY: "Is there any chance you are pregnant?" "When was your last menstrual period?"       n/a  Protocols used: DEPRESSION-A-AH

## 2019-06-30 NOTE — Telephone Encounter (Signed)
Spoke to [place] and advised her to proceed with orders.  Nothing further needed.

## 2019-07-02 DIAGNOSIS — I872 Venous insufficiency (chronic) (peripheral): Secondary | ICD-10-CM | POA: Diagnosis not present

## 2019-07-02 DIAGNOSIS — S32501D Unspecified fracture of right pubis, subsequent encounter for fracture with routine healing: Secondary | ICD-10-CM | POA: Diagnosis not present

## 2019-07-02 DIAGNOSIS — M7061 Trochanteric bursitis, right hip: Secondary | ICD-10-CM | POA: Diagnosis not present

## 2019-07-02 DIAGNOSIS — W19XXXD Unspecified fall, subsequent encounter: Secondary | ICD-10-CM | POA: Diagnosis not present

## 2019-07-02 DIAGNOSIS — I10 Essential (primary) hypertension: Secondary | ICD-10-CM | POA: Diagnosis not present

## 2019-07-02 DIAGNOSIS — M1712 Unilateral primary osteoarthritis, left knee: Secondary | ICD-10-CM | POA: Diagnosis not present

## 2019-07-04 NOTE — Progress Notes (Signed)
[name] [address] [place], [place] [address] Phone: [phone] Subjective:   Alicia Boyd, am serving as a scribe for Dr. Hulan Saas.   CC: Follow-up pelvic fracture and acute left-sided hip pain  GHW:[id number]   06/22/2019: Alicia Boyd is a 85 y.o. female coming in with complaint of left hip. Patient given injection today.  Tolerated the procedure well.  Discussed icing regimen and home exercises.  Discussed which activities to do which wants to avoid.  Patient likely compensating secondary to the pelvic fracture on the contralateral side.  Hopefully patient will continued healing well which will then stop her putting more pressure on the left side of the hip.  Update 07/05/2019: Patient had injection last visit. Did not have any relief. Has tried Pennsaid but that did not help either. Has a lot of soreness in the mornings. Patient is not taking the gabapentin as she lives alone. Has been doing home physical therapy. Does not feel as if these sessions are working.   Past Medical History:  Diagnosis Date  . Aortic heart murmur    AS/AI on 2D ECHO ; SBE prophylaxis Rxed  . Chest pain 2007 ; 2011   negative cardiac assessment  . Hyperlipidemia   . Interstitial cystitis    Resolved  . Osteoporosis    T score - 3.0 @ femoral neck; Fosamax affected swallowing  . Postmenopausal    Boyd PMH or HRT   Past Surgical History:  Procedure Laterality Date  . COLONOSCOPY  2011   Dr Sharlett Iles   Social History   Socioeconomic History  . Marital status: Married    Spouse name: Not on file  . Number of children: Not on file  . Years of education: Not on file  . Highest education level: Not on file  Occupational History  . Not on file  Social Needs  . Financial resource strain: Not on file  . Food insecurity    Worry: Not on file    Inability: Not on file  . Transportation needs    Medical: Not on file    Non-medical: Not on file   Tobacco Use  . Smoking status: Never Smoker  . Smokeless tobacco: Never Used  Substance and Sexual Activity  . Alcohol use: Boyd  . Drug use: Boyd  . Sexual activity: Not on file  Lifestyle  . Physical activity    Days per week: Not on file    Minutes per session: Not on file  . Stress: Not on file  Relationships  . Social Herbalist on phone: Not on file    Gets together: Not on file    Attends religious service: Not on file    Active member of club or organization: Not on file    Attends meetings of clubs or organizations: Not on file    Relationship status: Not on file  Other Topics Concern  . Not on file  Social History Narrative  . Not on file   Allergies  Allergen Reactions  . Amitid [Amitriptyline Hcl]     Patient experienced a dizzy, spinning feeling   . Metronidazole     REACTION: throat swells  . Penicillins     REACTION: rash & fever  . Alendronate Sodium     REACTION: unspecified  . Clarithromycin     GI intolerance  . Flonase [Fluticasone Propionate]     Slight Headache   . Promethazine Hcl  REACTION: unspecified  . Robitussin (Alcohol Free) [Guaifenesin] Other (See Comments)    unknown hyperactive  . Spironolactone     09/21/13 weakness in legs  . Singulair [Montelukast Sodium]     "spacey"; ineffective   Family History  Problem Relation Age of Onset  . Diabetes Father        Pancreatitic insufficiency post MVA  . Hypertension Mother   . Osteoporosis Mother   . Heart disease Mother        AF  . Thyroid disease Sister        hypothyroidism  . Coronary artery disease Sister        AF  . Stroke Sister 55       also pulmonary disease     Current Facility-Administered Medications (Endocrine & Metabolic):  .  methylPREDNISolone acetate (DEPO-MEDROL) injection 40 mg  Current Outpatient Medications (Cardiovascular):  .  amLODipine (NORVASC) 5 MG tablet, TAKE 1 TABLET BY MOUTH EVERY DAY .  metoprolol succinate (TOPROL-XL) 25 MG 24  hr tablet, TAKE 1 TABLET BY MOUTH EVERY DAY   Current Outpatient Medications (Respiratory):  .  fexofenadine (ALLEGRA) 180 MG tablet, Take 180 mg by mouth daily as needed.  Marland Kitchen  ipratropium (ATROVENT) 0.03 % nasal spray, USE 2 SPRAYS IN EACH NOSTRIL EVERY 12 HOURS. DO NOT USE MORE THAN 5 DAYS   Current Outpatient Medications (Analgesics):  .  traMADol (ULTRAM) 50 MG tablet, Take 1 tablet (50 mg total) by mouth every 6 (six) hours as needed.     Current Outpatient Medications (Other):  Marland Kitchen  Calcium Carbonate (CALCIUM 600 PO), Take by mouth daily. .  Cholecalciferol (VITAMIN D3) 1000 UNITS CAPS, Take by mouth daily. Marland Kitchen  gabapentin (NEURONTIN) 100 MG capsule, Take 1 capsule (100 mg total) by mouth at bedtime. .  Multiple Vitamin (MULTIVITAMIN) capsule, Take 1 capsule by mouth daily.   Vladimir Faster Glycol-Propyl Glycol (SYSTANE OP), Apply to eye. .  mirtazapine (REMERON) 7.5 MG tablet, Take 1 tablet (7.5 mg total) by mouth at bedtime.     Past medical history, social, surgical and family history all reviewed in electronic medical record.  Boyd pertanent information unless stated regarding to the chief complaint.   Review of Systems:  Boyd headache, visual changes, nausea, vomiting, diarrhea, constipation, dizziness, abdominal pain, skin rash, fevers, chills, night sweats, weight loss, swollen lymph nodes, body aches, joint swelling, chest pain, shortness of breath, mood changes.  Positive muscle aches  Objective  Blood pressure 128/64, pulse 80, height 5\' 4"  (1.626 m), SpO2 97 %.    General: Extremely anxious. HEENT: Pupils equal, extraocular movements intact  Respiratory: Patient's speak in full sentences and does not appear short of breath  Cardiovascular: Trace lower extremity edema, non tender, Boyd erythema  Skin: Warm dry intact with Boyd signs of infection or rash on extremities or on axial skeleton.  Abdomen: Soft nontender  Neuro: Cranial nerves II through XII are intact,  neurovascularly intact in all extremities with 2+ DTRs and 2+ pulses.  Lymph: Boyd lymphadenopathy of posterior or anterior cervical chain or axillae bilaterally.  Gait severely antalgic MSK: Patient does have significant arthritic changes of multiple joints.  Patient is tender to palpation everywhere.  Mild cachectic.  Unable to do the rest of exam secondary to patient being severely anxious    Impression and Recommendations:     This case required medical decision making of moderate complexity. The above documentation has been reviewed and is accurate and complete Lyndal Pulley,  DO       Note: This dictation was prepared with Dragon dictation along with smaller phrase technology. Any transcriptional errors that result from this process are unintentional.

## 2019-07-05 ENCOUNTER — Encounter: Payer: Self-pay | Admitting: Family Medicine

## 2019-07-05 ENCOUNTER — Telehealth: Payer: Self-pay | Admitting: Adult Health

## 2019-07-05 ENCOUNTER — Other Ambulatory Visit: Payer: Self-pay

## 2019-07-05 ENCOUNTER — Ambulatory Visit (INDEPENDENT_AMBULATORY_CARE_PROVIDER_SITE_OTHER): Payer: MEDICARE | Admitting: Family Medicine

## 2019-07-05 DIAGNOSIS — I10 Essential (primary) hypertension: Secondary | ICD-10-CM | POA: Diagnosis not present

## 2019-07-05 DIAGNOSIS — S32591A Other specified fracture of right pubis, initial encounter for closed fracture: Secondary | ICD-10-CM

## 2019-07-05 DIAGNOSIS — M7061 Trochanteric bursitis, right hip: Secondary | ICD-10-CM | POA: Diagnosis not present

## 2019-07-05 DIAGNOSIS — F419 Anxiety disorder, unspecified: Secondary | ICD-10-CM | POA: Diagnosis not present

## 2019-07-05 DIAGNOSIS — Z9181 History of falling: Secondary | ICD-10-CM

## 2019-07-05 DIAGNOSIS — F32A Depression, unspecified: Secondary | ICD-10-CM | POA: Insufficient documentation

## 2019-07-05 DIAGNOSIS — M1712 Unilateral primary osteoarthritis, left knee: Secondary | ICD-10-CM

## 2019-07-05 DIAGNOSIS — F329 Major depressive disorder, single episode, unspecified: Secondary | ICD-10-CM | POA: Diagnosis not present

## 2019-07-05 DIAGNOSIS — I872 Venous insufficiency (chronic) (peripheral): Secondary | ICD-10-CM | POA: Diagnosis not present

## 2019-07-05 DIAGNOSIS — W19XXXD Unspecified fall, subsequent encounter: Secondary | ICD-10-CM | POA: Diagnosis not present

## 2019-07-05 DIAGNOSIS — R269 Unspecified abnormalities of gait and mobility: Secondary | ICD-10-CM

## 2019-07-05 DIAGNOSIS — S32501D Unspecified fracture of right pubis, subsequent encounter for fracture with routine healing: Secondary | ICD-10-CM | POA: Diagnosis not present

## 2019-07-05 MED ORDER — MIRTAZAPINE 7.5 MG PO TABS: 7.5000 mg | ORAL_TABLET | Freq: Every day | ORAL | 0 refills | Status: DC

## 2019-07-05 NOTE — Assessment & Plan Note (Addendum)
Patient's previous x-rays did show some callus formation and should be healing appropriately.  Continue the vitamin D.  Patient did not respond well to the greater trochanteric injection but I am concerned the patient's anxiety is likely contributing more than anything else.  Patient to be well at a low dose.  Discussed which activities of doing which wants to avoid.  Patient will increase activity slowly.  I am concerned with patient's age, anxiety, and health issues that I do not want her to continue to come in the office regularly unless something is significantly painful and instead we will try a telephone or virtual visit in 1 to 2 weeks.  I am concerned the patient will have our difficulty with only managed outside of the possibility of a skilled nursing facility in the near future.  Spent  25 minutes with patient face-to-face and had greater than 50% of counseling including as described above in assessment and plan.

## 2019-07-05 NOTE — Patient Instructions (Signed)
Remeron at night Try to eat and drink appropriately Check in by phone in 2 weeks

## 2019-07-05 NOTE — Telephone Encounter (Signed)
Liji with [facility] requesting to  re certify the pt, as she was to be dc'd this week.  However, pt is having pain in the left leg pain. She is concerned if pt is dc'd now, she is concerned pt may decline.  Would like verbal order today  1 wk 6  (to start next week)  cb [phone]

## 2019-07-05 NOTE — Assessment & Plan Note (Addendum)
Patient has anxiety and depression is contributing and likely making some of the pain worse.  Patient is concerned about living alone.  Discussed about the possibility of moving into a skilled nursing facility and patient will consider it.  Remeron given at a low dose that I think will be more safe for this individual than any other medication. Hold on the gabapentin at this moment.  Follow-up again 4 to 8 weeks

## 2019-07-06 NOTE — Telephone Encounter (Signed)
Thanks for helping out

## 2019-07-06 NOTE — Telephone Encounter (Signed)
Ok for verbal orders ?

## 2019-07-06 NOTE — Telephone Encounter (Signed)
Spoke to [place] and notified her to proceed with orders.  She would also like an order sent to home health so the pt may have a 4 wheel walker.  Cory, I am not in the office this week.  Can you reach out to someone to have them fax in order.  [facility] is [phone].  Also send in patients demographics and insurance card.

## 2019-07-07 ENCOUNTER — Other Ambulatory Visit: Payer: Self-pay | Admitting: Adult Health

## 2019-07-08 DIAGNOSIS — K219 Gastro-esophageal reflux disease without esophagitis: Secondary | ICD-10-CM | POA: Diagnosis not present

## 2019-07-08 DIAGNOSIS — M1712 Unilateral primary osteoarthritis, left knee: Secondary | ICD-10-CM | POA: Diagnosis not present

## 2019-07-08 DIAGNOSIS — M7061 Trochanteric bursitis, right hip: Secondary | ICD-10-CM | POA: Diagnosis not present

## 2019-07-08 DIAGNOSIS — Z9181 History of falling: Secondary | ICD-10-CM | POA: Diagnosis not present

## 2019-07-08 DIAGNOSIS — M810 Age-related osteoporosis without current pathological fracture: Secondary | ICD-10-CM | POA: Diagnosis not present

## 2019-07-08 DIAGNOSIS — I872 Venous insufficiency (chronic) (peripheral): Secondary | ICD-10-CM | POA: Diagnosis not present

## 2019-07-08 DIAGNOSIS — I10 Essential (primary) hypertension: Secondary | ICD-10-CM | POA: Diagnosis not present

## 2019-07-08 DIAGNOSIS — M25552 Pain in left hip: Secondary | ICD-10-CM | POA: Diagnosis not present

## 2019-07-08 DIAGNOSIS — S32501D Unspecified fracture of right pubis, subsequent encounter for fracture with routine healing: Secondary | ICD-10-CM | POA: Diagnosis not present

## 2019-07-08 DIAGNOSIS — D649 Anemia, unspecified: Secondary | ICD-10-CM | POA: Diagnosis not present

## 2019-07-08 DIAGNOSIS — W19XXXD Unspecified fall, subsequent encounter: Secondary | ICD-10-CM | POA: Diagnosis not present

## 2019-07-09 ENCOUNTER — Telehealth: Payer: Self-pay | Admitting: Adult Health

## 2019-07-09 DIAGNOSIS — M1712 Unilateral primary osteoarthritis, left knee: Secondary | ICD-10-CM

## 2019-07-09 DIAGNOSIS — Z9181 History of falling: Secondary | ICD-10-CM

## 2019-07-09 DIAGNOSIS — R269 Unspecified abnormalities of gait and mobility: Secondary | ICD-10-CM

## 2019-07-09 NOTE — Telephone Encounter (Signed)
Can you order this for me?   Thanks  [employer]

## 2019-07-09 NOTE — Telephone Encounter (Signed)
Pt calling and needing a walker with no seat. Pt states that a new order needs to be sent in to [facility]. Pt states that it is the walker with two wheels and no seat.    We can call [phone]  Please advise

## 2019-07-09 NOTE — Telephone Encounter (Signed)
Order placed

## 2019-07-12 DIAGNOSIS — W19XXXD Unspecified fall, subsequent encounter: Secondary | ICD-10-CM | POA: Diagnosis not present

## 2019-07-12 DIAGNOSIS — I10 Essential (primary) hypertension: Secondary | ICD-10-CM | POA: Diagnosis not present

## 2019-07-12 DIAGNOSIS — S32501D Unspecified fracture of right pubis, subsequent encounter for fracture with routine healing: Secondary | ICD-10-CM | POA: Diagnosis not present

## 2019-07-12 DIAGNOSIS — M25552 Pain in left hip: Secondary | ICD-10-CM | POA: Diagnosis not present

## 2019-07-12 DIAGNOSIS — M1712 Unilateral primary osteoarthritis, left knee: Secondary | ICD-10-CM | POA: Diagnosis not present

## 2019-07-12 DIAGNOSIS — M7061 Trochanteric bursitis, right hip: Secondary | ICD-10-CM | POA: Diagnosis not present

## 2019-07-14 ENCOUNTER — Other Ambulatory Visit: Payer: Self-pay | Admitting: Family Medicine

## 2019-07-15 ENCOUNTER — Other Ambulatory Visit: Payer: Self-pay | Admitting: Family Medicine

## 2019-07-16 DIAGNOSIS — M7061 Trochanteric bursitis, right hip: Secondary | ICD-10-CM | POA: Diagnosis not present

## 2019-07-16 DIAGNOSIS — W19XXXD Unspecified fall, subsequent encounter: Secondary | ICD-10-CM | POA: Diagnosis not present

## 2019-07-16 DIAGNOSIS — M25552 Pain in left hip: Secondary | ICD-10-CM | POA: Diagnosis not present

## 2019-07-16 DIAGNOSIS — S32501D Unspecified fracture of right pubis, subsequent encounter for fracture with routine healing: Secondary | ICD-10-CM | POA: Diagnosis not present

## 2019-07-16 DIAGNOSIS — I10 Essential (primary) hypertension: Secondary | ICD-10-CM | POA: Diagnosis not present

## 2019-07-16 DIAGNOSIS — M1712 Unilateral primary osteoarthritis, left knee: Secondary | ICD-10-CM | POA: Diagnosis not present

## 2019-07-19 ENCOUNTER — Ambulatory Visit (INDEPENDENT_AMBULATORY_CARE_PROVIDER_SITE_OTHER): Payer: MEDICARE | Admitting: Family Medicine

## 2019-07-19 ENCOUNTER — Encounter: Payer: Self-pay | Admitting: Family Medicine

## 2019-07-19 DIAGNOSIS — S32591A Other specified fracture of right pubis, initial encounter for closed fracture: Secondary | ICD-10-CM

## 2019-07-19 NOTE — Assessment & Plan Note (Signed)
Patient is doing better overall.  Do not feel need to see her in office with her be high risk.  Discussed which activities of doing which was to avoid.

## 2019-07-19 NOTE — Progress Notes (Signed)
Virtual Visit via Video Note  I connected with Alicia Boyd on 07/19/19 at  2:45 PM EDT by a video enabled telemedicine application and verified that I am speaking with the correct person using two identifiers.  Location: Patient: at home Provider: In office   I discussed the limitations of evaluation and management by telemedicine and the availability of in person appointments. The patient expressed understanding and agreed to proceed.  History of Present Illness: 85 year old female following up for greater trochanteric bursitis as well as a pelvic fracture.  Patient is feeling 90% better.  Still needing the walker.  Working with home health physical therapy and home health nurse.  Making progress she thinks.  Feeling more confident and less pain every day    Observations/Objective: Very pleasant at the moment.   Assessment and Plan: Patient is healing from both.  Did well with the greater trochanteric, doing better with the Remeron at night.  Follow-up with me again virtually within 6 weeks   Follow Up Instructions: 6 weeks    I discussed the assessment and treatment plan with the patient. The patient was provided an opportunity to ask questions and all were answered. The patient agreed with the plan and demonstrated an understanding of the instructions.   The patient was advised to call back or seek an in-person evaluation if the symptoms worsen or if the condition fails to improve as anticipated.  I provided 12 minutes of face-to-face time during this encounter.   Lyndal Pulley, DO

## 2019-07-22 ENCOUNTER — Telehealth: Payer: Self-pay | Admitting: *Deleted

## 2019-07-22 NOTE — Telephone Encounter (Signed)
Copied from [place] [id number]. Topic: General - Other >> Jul 22, 2019  2:28 PM Keene Breath wrote: Reason for CRM: [facility] called to inform the doctor that the patient's PT for this week will be put on hold because her husband has passed away.  Nanine Means would like verbal orders to add the missed visit to the episode.  Please call to confirm.  CB# [phone]

## 2019-07-22 NOTE — Telephone Encounter (Signed)
Called and spoke to Alicia Boyd.  Advised she may put visit on hold.  Nothing further needed.

## 2019-07-27 DIAGNOSIS — W19XXXD Unspecified fall, subsequent encounter: Secondary | ICD-10-CM | POA: Diagnosis not present

## 2019-07-27 DIAGNOSIS — S32501D Unspecified fracture of right pubis, subsequent encounter for fracture with routine healing: Secondary | ICD-10-CM | POA: Diagnosis not present

## 2019-07-27 DIAGNOSIS — M25552 Pain in left hip: Secondary | ICD-10-CM | POA: Diagnosis not present

## 2019-07-27 DIAGNOSIS — M1712 Unilateral primary osteoarthritis, left knee: Secondary | ICD-10-CM | POA: Diagnosis not present

## 2019-07-27 DIAGNOSIS — I10 Essential (primary) hypertension: Secondary | ICD-10-CM | POA: Diagnosis not present

## 2019-07-27 DIAGNOSIS — M7061 Trochanteric bursitis, right hip: Secondary | ICD-10-CM | POA: Diagnosis not present

## 2019-07-30 DIAGNOSIS — S32501D Unspecified fracture of right pubis, subsequent encounter for fracture with routine healing: Secondary | ICD-10-CM | POA: Diagnosis not present

## 2019-07-30 DIAGNOSIS — M25552 Pain in left hip: Secondary | ICD-10-CM | POA: Diagnosis not present

## 2019-07-30 DIAGNOSIS — M7061 Trochanteric bursitis, right hip: Secondary | ICD-10-CM | POA: Diagnosis not present

## 2019-07-30 DIAGNOSIS — M1712 Unilateral primary osteoarthritis, left knee: Secondary | ICD-10-CM | POA: Diagnosis not present

## 2019-07-30 DIAGNOSIS — I10 Essential (primary) hypertension: Secondary | ICD-10-CM | POA: Diagnosis not present

## 2019-07-30 DIAGNOSIS — W19XXXD Unspecified fall, subsequent encounter: Secondary | ICD-10-CM | POA: Diagnosis not present

## 2019-08-03 DIAGNOSIS — M7061 Trochanteric bursitis, right hip: Secondary | ICD-10-CM | POA: Diagnosis not present

## 2019-08-03 DIAGNOSIS — M1712 Unilateral primary osteoarthritis, left knee: Secondary | ICD-10-CM | POA: Diagnosis not present

## 2019-08-03 DIAGNOSIS — S32501D Unspecified fracture of right pubis, subsequent encounter for fracture with routine healing: Secondary | ICD-10-CM | POA: Diagnosis not present

## 2019-08-03 DIAGNOSIS — W19XXXD Unspecified fall, subsequent encounter: Secondary | ICD-10-CM | POA: Diagnosis not present

## 2019-08-03 DIAGNOSIS — I10 Essential (primary) hypertension: Secondary | ICD-10-CM | POA: Diagnosis not present

## 2019-08-03 DIAGNOSIS — M25552 Pain in left hip: Secondary | ICD-10-CM | POA: Diagnosis not present

## 2019-08-07 DIAGNOSIS — M7061 Trochanteric bursitis, right hip: Secondary | ICD-10-CM | POA: Diagnosis not present

## 2019-08-07 DIAGNOSIS — W19XXXD Unspecified fall, subsequent encounter: Secondary | ICD-10-CM | POA: Diagnosis not present

## 2019-08-07 DIAGNOSIS — I872 Venous insufficiency (chronic) (peripheral): Secondary | ICD-10-CM | POA: Diagnosis not present

## 2019-08-07 DIAGNOSIS — M1712 Unilateral primary osteoarthritis, left knee: Secondary | ICD-10-CM | POA: Diagnosis not present

## 2019-08-07 DIAGNOSIS — D649 Anemia, unspecified: Secondary | ICD-10-CM | POA: Diagnosis not present

## 2019-08-07 DIAGNOSIS — M810 Age-related osteoporosis without current pathological fracture: Secondary | ICD-10-CM | POA: Diagnosis not present

## 2019-08-07 DIAGNOSIS — I10 Essential (primary) hypertension: Secondary | ICD-10-CM | POA: Diagnosis not present

## 2019-08-07 DIAGNOSIS — M25552 Pain in left hip: Secondary | ICD-10-CM | POA: Diagnosis not present

## 2019-08-07 DIAGNOSIS — K219 Gastro-esophageal reflux disease without esophagitis: Secondary | ICD-10-CM | POA: Diagnosis not present

## 2019-08-07 DIAGNOSIS — S32501D Unspecified fracture of right pubis, subsequent encounter for fracture with routine healing: Secondary | ICD-10-CM | POA: Diagnosis not present

## 2019-08-07 DIAGNOSIS — Z9181 History of falling: Secondary | ICD-10-CM | POA: Diagnosis not present

## 2019-08-12 DIAGNOSIS — M7061 Trochanteric bursitis, right hip: Secondary | ICD-10-CM | POA: Diagnosis not present

## 2019-08-12 DIAGNOSIS — M25552 Pain in left hip: Secondary | ICD-10-CM | POA: Diagnosis not present

## 2019-08-12 DIAGNOSIS — I10 Essential (primary) hypertension: Secondary | ICD-10-CM | POA: Diagnosis not present

## 2019-08-12 DIAGNOSIS — W19XXXD Unspecified fall, subsequent encounter: Secondary | ICD-10-CM | POA: Diagnosis not present

## 2019-08-12 DIAGNOSIS — M1712 Unilateral primary osteoarthritis, left knee: Secondary | ICD-10-CM | POA: Diagnosis not present

## 2019-08-12 DIAGNOSIS — S32501D Unspecified fracture of right pubis, subsequent encounter for fracture with routine healing: Secondary | ICD-10-CM | POA: Diagnosis not present

## 2019-09-01 ENCOUNTER — Encounter: Payer: Self-pay | Admitting: Family Medicine

## 2019-09-01 ENCOUNTER — Other Ambulatory Visit: Payer: Self-pay

## 2019-09-01 ENCOUNTER — Ambulatory Visit (INDEPENDENT_AMBULATORY_CARE_PROVIDER_SITE_OTHER): Payer: MEDICARE | Admitting: Family Medicine

## 2019-09-01 ENCOUNTER — Other Ambulatory Visit: Payer: Self-pay | Admitting: Family Medicine

## 2019-09-01 ENCOUNTER — Ambulatory Visit (INDEPENDENT_AMBULATORY_CARE_PROVIDER_SITE_OTHER)
Admission: RE | Admit: 2019-09-01 | Discharge: 2019-09-01 | Disposition: A | Discharge status: Home / Self Care | Payer: MEDICARE | Source: Ambulatory Visit | Attending: Family Medicine | Admitting: Family Medicine

## 2019-09-01 VITALS — BP 122/64 | HR 81 | Ht 64.0 in

## 2019-09-01 DIAGNOSIS — S76111A Strain of right quadriceps muscle, fascia and tendon, initial encounter: Secondary | ICD-10-CM | POA: Insufficient documentation

## 2019-09-01 DIAGNOSIS — S32591A Other specified fracture of right pubis, initial encounter for closed fracture: Secondary | ICD-10-CM

## 2019-09-01 DIAGNOSIS — M79604 Pain in right leg: Secondary | ICD-10-CM | POA: Diagnosis not present

## 2019-09-01 DIAGNOSIS — S72001A Fracture of unspecified part of neck of right femur, initial encounter for closed fracture: Secondary | ICD-10-CM | POA: Diagnosis not present

## 2019-09-01 DIAGNOSIS — R627 Adult failure to thrive: Secondary | ICD-10-CM | POA: Diagnosis not present

## 2019-09-01 DIAGNOSIS — R6251 Failure to thrive (child): Secondary | ICD-10-CM

## 2019-09-01 MED ORDER — AMBULATORY NON FORMULARY MEDICATION: 1.0000 [IU] | Freq: Once | 0 refills | Status: AC

## 2019-09-01 NOTE — Assessment & Plan Note (Signed)
Likely secondary to an quadricep strain.  Femur x-ray ordered today to rule out any occult fracture.  Discussed with patient about posture and ergonomics, discussed which activities to do which wants to avoid.  Discussed compression sleeve.  Patient definitely has some malnutrition that is likely contributing and encouraged her to eat more regularly.  Follow-up again in 4 to 6 weeks

## 2019-09-01 NOTE — Progress Notes (Signed)
[facility] [address] [place], [place] [address] Phone: [phone] Subjective:   Alicia Boyd, am serving as a scribe for Dr. Hulan Saas.   CC: Pelvic pain  [id number]   07/19/2019 Virtual visit: Patient is doing better overall.  Do not feel need to see her in office with her be high risk.  Discussed which activities of doing which was to avoid.  Update 09/01/2019: Alicia Boyd is a 85 y.o. female coming in with complaint of pelvic pain. Patient states she has been having right leg pain from her hip to her knee with movement in standing. Boyd pain at night.  Patient is having difficulty do certain things.  Still not going up stairs at home.  States that she is concerned that she will fall.  Sleeping on a pullout sofa in her home.  Using a walker to walk around her house on a more regular basis.  Also having right wrist pain. Entire hand is numb. Has had injection which helps her able to have some sensation in her hand.     Past Medical History:  Diagnosis Date  . Aortic heart murmur    AS/AI on 2D ECHO ; SBE prophylaxis Rxed  . Chest pain 2007 ; 2011   negative cardiac assessment  . Hyperlipidemia   . Interstitial cystitis    Resolved  . Osteoporosis    T score - 3.0 @ femoral neck; Fosamax affected swallowing  . Postmenopausal    Boyd PMH or HRT   Past Surgical History:  Procedure Laterality Date  . COLONOSCOPY  2011   Dr Sharlett Iles   Social History   Socioeconomic History  . Marital status: Married    Spouse name: Not on file  . Number of children: Not on file  . Years of education: Not on file  . Highest education level: Not on file  Occupational History  . Not on file  Social Needs  . Financial resource strain: Not on file  . Food insecurity    Worry: Not on file    Inability: Not on file  . Transportation needs    Medical: Not on file    Non-medical: Not on file  Tobacco Use  . Smoking status: Never Smoker  .  Smokeless tobacco: Never Used  Substance and Sexual Activity  . Alcohol use: Boyd  . Drug use: Boyd  . Sexual activity: Not on file  Lifestyle  . Physical activity    Days per week: Not on file    Minutes per session: Not on file  . Stress: Not on file  Relationships  . Social Herbalist on phone: Not on file    Gets together: Not on file    Attends religious service: Not on file    Active member of club or organization: Not on file    Attends meetings of clubs or organizations: Not on file    Relationship status: Not on file  Other Topics Concern  . Not on file  Social History Narrative  . Not on file   Allergies  Allergen Reactions  . Amitid [Amitriptyline Hcl]     Patient experienced a dizzy, spinning feeling   . Metronidazole     REACTION: throat swells  . Penicillins     REACTION: rash & fever  . Alendronate Sodium     REACTION: unspecified  . Clarithromycin     GI intolerance  . Flonase [Fluticasone Propionate]  Slight Headache   . Promethazine Hcl     REACTION: unspecified  . Robitussin (Alcohol Free) [Guaifenesin] Other (See Comments)    unknown hyperactive  . Spironolactone     09/21/13 weakness in legs  . Singulair [Montelukast Sodium]     "spacey"; ineffective   Family History  Problem Relation Age of Onset  . Diabetes Father        Pancreatitic insufficiency post MVA  . Hypertension Mother   . Osteoporosis Mother   . Heart disease Mother        AF  . Thyroid disease Sister        hypothyroidism  . Coronary artery disease Sister        AF  . Stroke Sister 97       also pulmonary disease     Current Facility-Administered Medications (Endocrine & Metabolic):  .  methylPREDNISolone acetate (DEPO-MEDROL) injection 40 mg  Current Outpatient Medications (Cardiovascular):  .  amLODipine (NORVASC) 5 MG tablet, TAKE 1 TABLET BY MOUTH EVERY DAY .  metoprolol succinate (TOPROL-XL) 25 MG 24 hr tablet, TAKE 1 TABLET BY MOUTH EVERY DAY    Current Outpatient Medications (Respiratory):  .  fexofenadine (ALLEGRA) 180 MG tablet, Take 180 mg by mouth daily as needed.  Marland Kitchen  ipratropium (ATROVENT) 0.03 % nasal spray, USE 2 SPRAYS IN EACH NOSTRIL EVERY 12 HOURS. DO NOT USE MORE THAN 5 DAYS   Current Outpatient Medications (Analgesics):  .  traMADol (ULTRAM) 50 MG tablet, Take 1 tablet (50 mg total) by mouth every 6 (six) hours as needed.     Current Outpatient Medications (Other):  Marland Kitchen  Calcium Carbonate (CALCIUM 600 PO), Take by mouth daily. .  Cholecalciferol (VITAMIN D3) 1000 UNITS CAPS, Take by mouth daily. Marland Kitchen  gabapentin (NEURONTIN) 100 MG capsule, TAKE 1 CAPSULE (100 MG TOTAL) BY MOUTH AT BEDTIME. .  mirtazapine (REMERON) 7.5 MG tablet, TAKE 1 TABLET (7.5 MG TOTAL) BY MOUTH AT BEDTIME. .  Multiple Vitamin (MULTIVITAMIN) capsule, Take 1 capsule by mouth daily.   Vladimir Faster Glycol-Propyl Glycol (SYSTANE OP), Apply to eye. Marland Kitchen  AMBULATORY NON FORMULARY MEDICATION, 1 Units by Other route once for 1 dose. Rollator walker with seat     Past medical history, social, surgical and family history all reviewed in electronic medical record.  Boyd pertanent information unless stated regarding to the chief complaint.   Review of Systems:  Boyd headache, visual changes, nausea, vomiting, diarrhea, constipation, dizziness, abdominal pain, skin rash, fevers, chills, night sweats, weight loss, swollen lymph nodes,  chest pain, shortness of breath, mood changes.  Muscle aches, body aches  Objective  Blood pressure 122/64, pulse 81, height 5\' 4"  (1.626 m), SpO2 97 %.    General: Boyd apparent distress alert not fully oriented, very anxious, cachectic HEENT: Pupils equal, extraocular movements intact  Respiratory: Patient's speak in full sentences and does not appear short of breath  Cardiovascular: 2+ lower extremity edema, non tender, Boyd erythema  Skin: Warm dry intact with Boyd signs of infection or rash on extremities or on axial skeleton.   Abdomen: Soft nontender  Neuro: Cranial nerves II through XII are intact, neurovascularly intact in all extremities with 2+ DTRs and 2+ pulses.  Lymph: Boyd lymphadenopathy of posterior or anterior cervical chain or axillae bilaterally.  Gait significant difficulty even putting weight on the walker at this moment.  He does have significant weakness and atrophy of the lower extremities.  Patient does have more tenderness noted of the legs.  Diffusely but more on the right side.  Mild positive fulcrum test of the femur on the right.  Neurovascular intact distally. MSK:      Impression and Recommendations:     This case required medical decision making of moderate complexity. The above documentation has been reviewed and is accurate and complete Lyndal Pulley, DO       Note: This dictation was prepared with Dragon dictation along with smaller phrase technology. Any transcriptional errors that result from this process are unintentional.

## 2019-09-01 NOTE — Patient Instructions (Signed)
Xray downstairs Home health will call you See me again in 4 weeks

## 2019-09-01 NOTE — Assessment & Plan Note (Signed)
Patient is having significant difficulty.  Does not want to leave her house at this time.  Discussed with patient in great length.  Discussed also with her neighbor.  I believe that patient is having difficulty taking care of herself and do feel bad at this time with patient is failure to thrive, anxiety, dementia, insufficiency fractures I am concerned that patient longevity and lifespan is likely less than 6 months and I would like further evaluation for hospice.  In addition to this will consider home health RN to help her at this moment.  Follow-up again with me in 4 weeks

## 2019-09-02 ENCOUNTER — Telehealth: Payer: Self-pay | Admitting: Adult Health

## 2019-09-02 ENCOUNTER — Other Ambulatory Visit: Payer: Self-pay

## 2019-09-02 ENCOUNTER — Telehealth: Payer: Self-pay

## 2019-09-02 DIAGNOSIS — S72001A Fracture of unspecified part of neck of right femur, initial encounter for closed fracture: Secondary | ICD-10-CM

## 2019-09-02 NOTE — Telephone Encounter (Signed)
Spoke with patient. She is to see Ailene Ravel, or [facility] at [facility]. Referral has been placed. Provided number to patient in order to call.

## 2019-09-02 NOTE — Telephone Encounter (Signed)
Dr. Smith patient.  

## 2019-09-02 NOTE — Telephone Encounter (Signed)
Copied from [place] [id number]. Topic: General - Other >> Sep 02, 2019  2:48 PM Keene Breath wrote: Reason for CRM: Patient called to speak with the referral coordinator regarding a referral to ortho.  Please call patient to give an update.  CB# [phone]

## 2019-09-02 NOTE — Telephone Encounter (Signed)
Spoke with patient per xray results verbalized from Dr. Tamala Julian. Referral sent into [facility]. Patient given their contact information and will call them to set up an appointment.

## 2019-09-03 ENCOUNTER — Encounter (HOSPITAL_COMMUNITY)
Admission: EM | Disposition: A | Discharge status: Skilled Nursing Facility | Payer: Self-pay | Source: Home / Self Care | Attending: Family Medicine

## 2019-09-03 ENCOUNTER — Inpatient Hospital Stay (HOSPITAL_COMMUNITY): Payer: MEDICARE

## 2019-09-03 ENCOUNTER — Inpatient Hospital Stay (HOSPITAL_COMMUNITY): Payer: MEDICARE | Admitting: Anesthesiology

## 2019-09-03 ENCOUNTER — Inpatient Hospital Stay: Admit: 2019-09-03 | Payer: MEDICARE | Admitting: Orthopedic Surgery

## 2019-09-03 ENCOUNTER — Other Ambulatory Visit: Payer: Self-pay

## 2019-09-03 ENCOUNTER — Encounter (HOSPITAL_COMMUNITY): Payer: Self-pay

## 2019-09-03 ENCOUNTER — Inpatient Hospital Stay (HOSPITAL_COMMUNITY)
Admission: EM | Admit: 2019-09-03 | Discharge: 2019-09-09 | DRG: 470 | Disposition: A | Discharge status: Skilled Nursing Facility | Payer: MEDICARE | Attending: Internal Medicine | Admitting: Internal Medicine

## 2019-09-03 DIAGNOSIS — I10 Essential (primary) hypertension: Secondary | ICD-10-CM | POA: Diagnosis present

## 2019-09-03 DIAGNOSIS — Z880 Allergy status to penicillin: Secondary | ICD-10-CM

## 2019-09-03 DIAGNOSIS — S72041A Displaced fracture of base of neck of right femur, initial encounter for closed fracture: Secondary | ICD-10-CM | POA: Diagnosis not present

## 2019-09-03 DIAGNOSIS — E440 Moderate protein-calorie malnutrition: Secondary | ICD-10-CM | POA: Insufficient documentation

## 2019-09-03 DIAGNOSIS — D62 Acute posthemorrhagic anemia: Secondary | ICD-10-CM | POA: Diagnosis not present

## 2019-09-03 DIAGNOSIS — E785 Hyperlipidemia, unspecified: Secondary | ICD-10-CM | POA: Diagnosis present

## 2019-09-03 DIAGNOSIS — R739 Hyperglycemia, unspecified: Secondary | ICD-10-CM | POA: Diagnosis present

## 2019-09-03 DIAGNOSIS — Z8249 Family history of ischemic heart disease and other diseases of the circulatory system: Secondary | ICD-10-CM | POA: Diagnosis not present

## 2019-09-03 DIAGNOSIS — Z66 Do not resuscitate: Secondary | ICD-10-CM | POA: Diagnosis not present

## 2019-09-03 DIAGNOSIS — E871 Hypo-osmolality and hyponatremia: Secondary | ICD-10-CM | POA: Diagnosis not present

## 2019-09-03 DIAGNOSIS — M810 Age-related osteoporosis without current pathological fracture: Secondary | ICD-10-CM | POA: Diagnosis present

## 2019-09-03 DIAGNOSIS — Z888 Allergy status to other drugs, medicaments and biological substances status: Secondary | ICD-10-CM | POA: Diagnosis not present

## 2019-09-03 DIAGNOSIS — R2689 Other abnormalities of gait and mobility: Secondary | ICD-10-CM | POA: Diagnosis not present

## 2019-09-03 DIAGNOSIS — S72001D Fracture of unspecified part of neck of right femur, subsequent encounter for closed fracture with routine healing: Secondary | ICD-10-CM | POA: Diagnosis not present

## 2019-09-03 DIAGNOSIS — G8911 Acute pain due to trauma: Secondary | ICD-10-CM | POA: Diagnosis not present

## 2019-09-03 DIAGNOSIS — Z419 Encounter for procedure for purposes other than remedying health state, unspecified: Secondary | ICD-10-CM

## 2019-09-03 DIAGNOSIS — Z7401 Bed confinement status: Secondary | ICD-10-CM | POA: Diagnosis not present

## 2019-09-03 DIAGNOSIS — R011 Cardiac murmur, unspecified: Secondary | ICD-10-CM | POA: Diagnosis present

## 2019-09-03 DIAGNOSIS — Z20828 Contact with and (suspected) exposure to other viral communicable diseases: Secondary | ICD-10-CM | POA: Diagnosis present

## 2019-09-03 DIAGNOSIS — Z881 Allergy status to other antibiotic agents status: Secondary | ICD-10-CM | POA: Diagnosis not present

## 2019-09-03 DIAGNOSIS — R140 Abdominal distension (gaseous): Secondary | ICD-10-CM | POA: Diagnosis not present

## 2019-09-03 DIAGNOSIS — S7291XA Unspecified fracture of right femur, initial encounter for closed fracture: Secondary | ICD-10-CM | POA: Diagnosis not present

## 2019-09-03 DIAGNOSIS — E876 Hypokalemia: Secondary | ICD-10-CM | POA: Diagnosis present

## 2019-09-03 DIAGNOSIS — R110 Nausea: Secondary | ICD-10-CM | POA: Diagnosis not present

## 2019-09-03 DIAGNOSIS — M62838 Other muscle spasm: Secondary | ICD-10-CM | POA: Diagnosis not present

## 2019-09-03 DIAGNOSIS — D72829 Elevated white blood cell count, unspecified: Secondary | ICD-10-CM | POA: Diagnosis not present

## 2019-09-03 DIAGNOSIS — R52 Pain, unspecified: Secondary | ICD-10-CM | POA: Diagnosis not present

## 2019-09-03 DIAGNOSIS — Z111 Encounter for screening for respiratory tuberculosis: Secondary | ICD-10-CM | POA: Diagnosis not present

## 2019-09-03 DIAGNOSIS — Z79899 Other long term (current) drug therapy: Secondary | ICD-10-CM | POA: Diagnosis not present

## 2019-09-03 DIAGNOSIS — D649 Anemia, unspecified: Secondary | ICD-10-CM | POA: Diagnosis not present

## 2019-09-03 DIAGNOSIS — Z8781 Personal history of (healed) traumatic fracture: Secondary | ICD-10-CM

## 2019-09-03 DIAGNOSIS — K5900 Constipation, unspecified: Secondary | ICD-10-CM | POA: Diagnosis not present

## 2019-09-03 DIAGNOSIS — Z03818 Encounter for observation for suspected exposure to other biological agents ruled out: Secondary | ICD-10-CM | POA: Diagnosis not present

## 2019-09-03 DIAGNOSIS — E559 Vitamin D deficiency, unspecified: Secondary | ICD-10-CM | POA: Diagnosis not present

## 2019-09-03 DIAGNOSIS — S72001A Fracture of unspecified part of neck of right femur, initial encounter for closed fracture: Secondary | ICD-10-CM | POA: Diagnosis not present

## 2019-09-03 DIAGNOSIS — X58XXXA Exposure to other specified factors, initial encounter: Secondary | ICD-10-CM | POA: Diagnosis present

## 2019-09-03 DIAGNOSIS — F418 Other specified anxiety disorders: Secondary | ICD-10-CM | POA: Diagnosis not present

## 2019-09-03 DIAGNOSIS — Z8262 Family history of osteoporosis: Secondary | ICD-10-CM

## 2019-09-03 DIAGNOSIS — R945 Abnormal results of liver function studies: Secondary | ICD-10-CM | POA: Diagnosis present

## 2019-09-03 DIAGNOSIS — J310 Chronic rhinitis: Secondary | ICD-10-CM | POA: Diagnosis not present

## 2019-09-03 DIAGNOSIS — H04129 Dry eye syndrome of unspecified lacrimal gland: Secondary | ICD-10-CM | POA: Diagnosis not present

## 2019-09-03 DIAGNOSIS — R5381 Other malaise: Secondary | ICD-10-CM | POA: Diagnosis not present

## 2019-09-03 DIAGNOSIS — M80051D Age-related osteoporosis with current pathological fracture, right femur, subsequent encounter for fracture with routine healing: Secondary | ICD-10-CM | POA: Diagnosis not present

## 2019-09-03 DIAGNOSIS — M25551 Pain in right hip: Secondary | ICD-10-CM | POA: Diagnosis not present

## 2019-09-03 DIAGNOSIS — M2550 Pain in unspecified joint: Secondary | ICD-10-CM | POA: Diagnosis not present

## 2019-09-03 DIAGNOSIS — M6281 Muscle weakness (generalized): Secondary | ICD-10-CM | POA: Diagnosis not present

## 2019-09-03 DIAGNOSIS — E43 Unspecified severe protein-calorie malnutrition: Secondary | ICD-10-CM | POA: Diagnosis not present

## 2019-09-03 DIAGNOSIS — E7849 Other hyperlipidemia: Secondary | ICD-10-CM | POA: Diagnosis not present

## 2019-09-03 HISTORY — PX: ANTERIOR APPROACH HEMI HIP ARTHROPLASTY: SHX6690

## 2019-09-03 LAB — BASIC METABOLIC PANEL
Anion gap: 7 (ref 5–15)
BUN: 16 mg/dL (ref 8–23)
CO2: 27 mmol/L (ref 22–32)
Calcium: 9.3 mg/dL (ref 8.9–10.3)
Chloride: 103 mmol/L (ref 98–111)
Creatinine, Ser: 0.41 mg/dL — ABNORMAL LOW (ref 0.44–1.00)
GFR calc Af Amer: >60 mL/min (ref >60)
GFR calc non Af Amer: >60 mL/min (ref >60)
Glucose, Bld: 97 mg/dL (ref 70–99)
Potassium: 3.6 mmol/L (ref 3.5–5.1)
Sodium: 137 mmol/L (ref 135–145)

## 2019-09-03 LAB — CBC WITH DIFFERENTIAL/PLATELET
Abs Immature Granulocytes: 0.03 10*3/uL (ref 0.00–0.07)
Basophils Absolute: 0.1 10*3/uL (ref 0.0–0.1)
Basophils Relative: 1 %
Eosinophils Absolute: 0.1 10*3/uL (ref 0.0–0.5)
Eosinophils Relative: 1 %
HCT: 37 % (ref 36.0–46.0)
Hemoglobin: 12.2 g/dL (ref 12.0–15.0)
Immature Granulocytes: 1 %
Lymphocytes Relative: 14 %
Lymphs Abs: 0.9 10*3/uL (ref 0.7–4.0)
MCH: 32 pg (ref 26.0–34.0)
MCHC: 33 g/dL (ref 30.0–36.0)
MCV: 97.1 fL (ref 80.0–100.0)
Monocytes Absolute: 0.4 10*3/uL (ref 0.1–1.0)
Monocytes Relative: 6 %
Neutro Abs: 5.1 10*3/uL (ref 1.7–7.7)
Neutrophils Relative %: 77 %
Platelets: 246 10*3/uL (ref 150–400)
RBC: 3.81 MIL/uL — ABNORMAL LOW (ref 3.87–5.11)
RDW: 12.2 % (ref 11.5–15.5)
WBC: 6.6 10*3/uL (ref 4.0–10.5)
nRBC: 0 % (ref 0.0–0.2)

## 2019-09-03 LAB — TYPE AND SCREEN
ABO/RH(D): A POS
Antibody Screen: NEGATIVE

## 2019-09-03 LAB — PROTIME-INR
INR: 1 (ref 0.8–1.2)
Prothrombin Time: 12.6 seconds (ref 11.4–15.2)

## 2019-09-03 LAB — APTT: aPTT: 25 seconds (ref 24–36)

## 2019-09-03 LAB — SARS CORONAVIRUS 2 BY RT PCR (HOSPITAL ORDER, PERFORMED IN ~~LOC~~ HOSPITAL LAB): SARS Coronavirus 2: NEGATIVE

## 2019-09-03 SURGERY — HEMIARTHROPLASTY, HIP, DIRECT ANTERIOR APPROACH, FOR FRACTURE
Anesthesia: General | Laterality: Right

## 2019-09-03 MED ORDER — OXYCODONE HCL 5 MG/5ML PO SOLN: 5.0000 mg | Freq: Once | ORAL | Status: DC | PRN

## 2019-09-03 MED ORDER — MAGNESIUM CITRATE PO SOLN: 1.0000 | Freq: Once | ORAL | Status: DC | PRN

## 2019-09-03 MED ORDER — FERROUS SULFATE 325 (65 FE) MG PO TABS
325.0000 mg | ORAL_TABLET | Freq: Every day | ORAL | Status: DC
  Administered 2019-09-04 – 2019-09-09 (×6): 325 mg via ORAL
  Filled 2019-09-03 (×5): qty 1

## 2019-09-03 MED ORDER — LIDOCAINE 2% (20 MG/ML) 5 ML SYRINGE
INTRAMUSCULAR | Status: DC | PRN
  Administered 2019-09-03: 60 mg via INTRAVENOUS

## 2019-09-03 MED ORDER — VANCOMYCIN HCL IN DEXTROSE 1-5 GM/200ML-% IV SOLN
INTRAVENOUS | Status: AC
  Filled 2019-09-03: qty 200

## 2019-09-03 MED ORDER — ENSURE PRE-SURGERY PO LIQD
296.0000 mL | Freq: Once | ORAL | Status: DC
  Filled 2019-09-03: qty 296

## 2019-09-03 MED ORDER — TRANEXAMIC ACID-NACL 1000-0.7 MG/100ML-% IV SOLN
INTRAVENOUS | Status: AC
  Filled 2019-09-03: qty 100

## 2019-09-03 MED ORDER — ONDANSETRON HCL 4 MG PO TABS
4.0000 mg | ORAL_TABLET | Freq: Four times a day (QID) | ORAL | Status: DC | PRN
  Administered 2019-09-05: 17:00:00 4 mg via ORAL
  Filled 2019-09-03: qty 1

## 2019-09-03 MED ORDER — CHLORHEXIDINE GLUCONATE 4 % EX LIQD: 60.0000 mL | Freq: Once | CUTANEOUS | Status: DC

## 2019-09-03 MED ORDER — BUPIVACAINE HCL (PF) 0.5 % IJ SOLN
INTRAMUSCULAR | Status: AC
  Filled 2019-09-03: qty 30

## 2019-09-03 MED ORDER — SODIUM CHLORIDE 0.9 % IR SOLN
Status: DC | PRN
  Administered 2019-09-03: 1000 mL

## 2019-09-03 MED ORDER — LIDOCAINE 2% (20 MG/ML) 5 ML SYRINGE
INTRAMUSCULAR | Status: AC
  Filled 2019-09-03: qty 5

## 2019-09-03 MED ORDER — ONDANSETRON HCL 4 MG/2ML IJ SOLN: 4.0000 mg | Freq: Once | INTRAMUSCULAR | Status: DC | PRN

## 2019-09-03 MED ORDER — METOPROLOL TARTRATE 5 MG/5ML IV SOLN
2.5000 mg | Freq: Four times a day (QID) | INTRAVENOUS | Status: DC
  Administered 2019-09-04 (×2): 2.5 mg via INTRAVENOUS
  Filled 2019-09-03 (×2): qty 5

## 2019-09-03 MED ORDER — CLINDAMYCIN PHOSPHATE 900 MG/50ML IV SOLN
INTRAVENOUS | Status: DC | PRN
  Administered 2019-09-03: 900 mg via INTRAVENOUS

## 2019-09-03 MED ORDER — POVIDONE-IODINE 10 % EX SWAB: 2.0000 "application " | Freq: Once | CUTANEOUS | Status: DC

## 2019-09-03 MED ORDER — PROPOFOL 10 MG/ML IV BOLUS
INTRAVENOUS | Status: DC | PRN
  Administered 2019-09-03: 70 mg via INTRAVENOUS
  Administered 2019-09-03: 30 mg via INTRAVENOUS

## 2019-09-03 MED ORDER — CLINDAMYCIN PHOSPHATE 300 MG/50ML IV SOLN
300.0000 mg | Freq: Four times a day (QID) | INTRAVENOUS | Status: AC
  Administered 2019-09-04 (×2): 300 mg via INTRAVENOUS
  Filled 2019-09-03 (×2): qty 50

## 2019-09-03 MED ORDER — SODIUM CHLORIDE (PF) 0.9 % IJ SOLN
INTRAMUSCULAR | Status: DC | PRN
  Administered 2019-09-03: 20 mL

## 2019-09-03 MED ORDER — TRAMADOL HCL 50 MG PO TABS
50.0000 mg | ORAL_TABLET | Freq: Four times a day (QID) | ORAL | Status: DC
  Administered 2019-09-03 – 2019-09-04 (×4): 50 mg via ORAL
  Filled 2019-09-03 (×4): qty 1

## 2019-09-03 MED ORDER — PROPOFOL 10 MG/ML IV BOLUS
INTRAVENOUS | Status: AC
  Filled 2019-09-03: qty 20

## 2019-09-03 MED ORDER — BUPIVACAINE LIPOSOME 1.3 % IJ SUSP
INTRAMUSCULAR | Status: DC | PRN
  Administered 2019-09-03: 10 mL

## 2019-09-03 MED ORDER — AMLODIPINE BESYLATE 5 MG PO TABS
5.0000 mg | ORAL_TABLET | Freq: Every evening | ORAL | Status: DC
  Administered 2019-09-03 – 2019-09-08 (×6): 5 mg via ORAL
  Filled 2019-09-03 (×6): qty 1

## 2019-09-03 MED ORDER — BUPIVACAINE HCL 0.5 % IJ SOLN
INTRAMUSCULAR | Status: DC | PRN
  Administered 2019-09-03: 20 mL

## 2019-09-03 MED ORDER — FENTANYL CITRATE (PF) 100 MCG/2ML IJ SOLN
INTRAMUSCULAR | Status: AC
  Filled 2019-09-03: qty 2

## 2019-09-03 MED ORDER — ACETAMINOPHEN 325 MG PO TABS
325.0000 mg | ORAL_TABLET | Freq: Four times a day (QID) | ORAL | Status: DC | PRN
  Administered 2019-09-04 – 2019-09-09 (×7): 650 mg via ORAL
  Filled 2019-09-03 (×7): qty 2

## 2019-09-03 MED ORDER — METOPROLOL SUCCINATE ER 25 MG PO TB24
25.0000 mg | ORAL_TABLET | Freq: Every day | ORAL | Status: DC
  Administered 2019-09-04 – 2019-09-09 (×6): 25 mg via ORAL
  Filled 2019-09-03 (×6): qty 1

## 2019-09-03 MED ORDER — BUPIVACAINE LIPOSOME 1.3 % IJ SUSP
10.0000 mL | Freq: Once | INTRAMUSCULAR | Status: DC
  Filled 2019-09-03: qty 10

## 2019-09-03 MED ORDER — MORPHINE SULFATE (PF) 2 MG/ML IV SOLN: 1.0000 mg | INTRAVENOUS | Status: DC | PRN

## 2019-09-03 MED ORDER — OXYCODONE HCL 5 MG PO TABS: 5.0000 mg | ORAL_TABLET | Freq: Once | ORAL | Status: DC | PRN

## 2019-09-03 MED ORDER — METHOCARBAMOL 1000 MG/10ML IJ SOLN
500.0000 mg | Freq: Four times a day (QID) | INTRAVENOUS | Status: DC | PRN
  Filled 2019-09-03: qty 5

## 2019-09-03 MED ORDER — SODIUM CHLORIDE 0.9 % IV SOLN
INTRAVENOUS | Status: DC
  Administered 2019-09-03: 23:00:00 via INTRAVENOUS

## 2019-09-03 MED ORDER — ASPIRIN EC 325 MG PO TBEC: 325.0000 mg | DELAYED_RELEASE_TABLET | Freq: Every day | ORAL | 0 refills | Status: DC

## 2019-09-03 MED ORDER — FENTANYL CITRATE (PF) 100 MCG/2ML IJ SOLN
25.0000 ug | INTRAMUSCULAR | Status: DC | PRN
  Administered 2019-09-03 (×2): 25 ug via INTRAVENOUS

## 2019-09-03 MED ORDER — LACTATED RINGERS IV SOLN
INTRAVENOUS | Status: DC
  Administered 2019-09-03: 19:00:00 via INTRAVENOUS

## 2019-09-03 MED ORDER — PROPOFOL 500 MG/50ML IV EMUL
INTRAVENOUS | Status: DC | PRN
  Administered 2019-09-03: 25 ug/kg/min via INTRAVENOUS

## 2019-09-03 MED ORDER — HYDROCODONE-ACETAMINOPHEN 5-325 MG PO TABS
1.0000 | ORAL_TABLET | Freq: Four times a day (QID) | ORAL | Status: DC | PRN
  Administered 2019-09-04: 11:00:00 1 via ORAL
  Filled 2019-09-03: qty 1

## 2019-09-03 MED ORDER — ASPIRIN EC 325 MG PO TBEC
325.0000 mg | DELAYED_RELEASE_TABLET | Freq: Every day | ORAL | Status: DC
  Administered 2019-09-04 – 2019-09-09 (×6): 325 mg via ORAL
  Filled 2019-09-03 (×6): qty 1

## 2019-09-03 MED ORDER — METHOCARBAMOL 500 MG PO TABS
500.0000 mg | ORAL_TABLET | Freq: Four times a day (QID) | ORAL | Status: DC | PRN
  Administered 2019-09-03 – 2019-09-09 (×8): 500 mg via ORAL
  Filled 2019-09-03 (×8): qty 1

## 2019-09-03 MED ORDER — ONDANSETRON HCL 4 MG/2ML IJ SOLN
INTRAMUSCULAR | Status: AC
  Filled 2019-09-03: qty 2

## 2019-09-03 MED ORDER — DOCUSATE SODIUM 100 MG PO CAPS
100.0000 mg | ORAL_CAPSULE | Freq: Two times a day (BID) | ORAL | Status: DC
  Administered 2019-09-03 – 2019-09-09 (×10): 100 mg via ORAL
  Filled 2019-09-03 (×11): qty 1

## 2019-09-03 MED ORDER — CEFAZOLIN SODIUM-DEXTROSE 2-4 GM/100ML-% IV SOLN
2.0000 g | INTRAVENOUS | Status: DC
  Filled 2019-09-03: qty 100

## 2019-09-03 MED ORDER — BISACODYL 5 MG PO TBEC: 5.0000 mg | DELAYED_RELEASE_TABLET | Freq: Every day | ORAL | Status: DC | PRN

## 2019-09-03 MED ORDER — ONDANSETRON HCL 4 MG/2ML IJ SOLN: 4.0000 mg | Freq: Four times a day (QID) | INTRAMUSCULAR | Status: DC | PRN

## 2019-09-03 MED ORDER — TRANEXAMIC ACID-NACL 1000-0.7 MG/100ML-% IV SOLN
INTRAVENOUS | Status: DC | PRN
  Administered 2019-09-03: 1000 mg via INTRAVENOUS

## 2019-09-03 MED ORDER — STERILE WATER FOR IRRIGATION IR SOLN
Status: DC | PRN
  Administered 2019-09-03: 2000 mL

## 2019-09-03 MED ORDER — ADULT MULTIVITAMIN W/MINERALS CH
1.0000 | ORAL_TABLET | Freq: Every day | ORAL | Status: DC
  Administered 2019-09-04 – 2019-09-09 (×6): 1 via ORAL
  Filled 2019-09-03 (×6): qty 1

## 2019-09-03 MED ORDER — ONDANSETRON HCL 4 MG PO TABS: 4.0000 mg | ORAL_TABLET | Freq: Four times a day (QID) | ORAL | Status: DC | PRN

## 2019-09-03 MED ORDER — PHENYLEPHRINE 40 MCG/ML (10ML) SYRINGE FOR IV PUSH (FOR BLOOD PRESSURE SUPPORT)
PREFILLED_SYRINGE | INTRAVENOUS | Status: DC | PRN
  Administered 2019-09-03: 80 ug via INTRAVENOUS

## 2019-09-03 MED ORDER — POLYETHYLENE GLYCOL 3350 17 G PO PACK
17.0000 g | PACK | Freq: Every day | ORAL | Status: DC | PRN
  Administered 2019-09-05 – 2019-09-06 (×2): 17 g via ORAL
  Filled 2019-09-03: qty 1

## 2019-09-03 MED ORDER — ROCURONIUM BROMIDE 10 MG/ML (PF) SYRINGE
PREFILLED_SYRINGE | INTRAVENOUS | Status: AC
  Filled 2019-09-03: qty 10

## 2019-09-03 MED ORDER — ROCURONIUM BROMIDE 10 MG/ML (PF) SYRINGE
PREFILLED_SYRINGE | INTRAVENOUS | Status: DC | PRN
  Administered 2019-09-03: 40 mg via INTRAVENOUS

## 2019-09-03 MED ORDER — FENTANYL CITRATE (PF) 100 MCG/2ML IJ SOLN
INTRAMUSCULAR | Status: DC | PRN
  Administered 2019-09-03 (×3): 50 ug via INTRAVENOUS

## 2019-09-03 MED ORDER — ONDANSETRON HCL 4 MG/2ML IJ SOLN
INTRAMUSCULAR | Status: DC | PRN
  Administered 2019-09-03: 4 mg via INTRAVENOUS

## 2019-09-03 MED ORDER — SODIUM CHLORIDE (PF) 0.9 % IJ SOLN
INTRAMUSCULAR | Status: AC
  Filled 2019-09-03: qty 50

## 2019-09-03 MED ORDER — TRANEXAMIC ACID 1000 MG/10ML IV SOLN: INTRAVENOUS | Status: DC | PRN

## 2019-09-03 MED ORDER — PHENYLEPHRINE 40 MCG/ML (10ML) SYRINGE FOR IV PUSH (FOR BLOOD PRESSURE SUPPORT)
PREFILLED_SYRINGE | INTRAVENOUS | Status: AC
  Filled 2019-09-03: qty 10

## 2019-09-03 MED ORDER — CLINDAMYCIN PHOSPHATE 900 MG/50ML IV SOLN
INTRAVENOUS | Status: AC
  Filled 2019-09-03: qty 50

## 2019-09-03 MED ORDER — SUGAMMADEX SODIUM 200 MG/2ML IV SOLN
INTRAVENOUS | Status: DC | PRN
  Administered 2019-09-03: 100 mg via INTRAVENOUS

## 2019-09-03 MED ORDER — ALUM & MAG HYDROXIDE-SIMETH 200-200-20 MG/5ML PO SUSP: 30.0000 mL | ORAL | Status: DC | PRN

## 2019-09-03 SURGICAL SUPPLY — 59 items
APL SKNCLS STERI-STRIP NONHPOA (GAUZE/BANDAGES/DRESSINGS) ×1
BAG SPEC THK2 15X12 ZIP CLS (MISCELLANEOUS) ×1
BAG ZIPLOCK 12X15 (MISCELLANEOUS) ×2 IMPLANT
BENZOIN TINCTURE PRP APPL 2/3 (GAUZE/BANDAGES/DRESSINGS) ×1 IMPLANT
BIPOLAR PROS AML 46 (Hips) ×2 IMPLANT
BLADE HEX COATED 2.75 (ELECTRODE) ×2 IMPLANT
BLADE SAW SAG 73X25 THK (BLADE) ×1
BLADE SAW SGTL 73X25 THK (BLADE) ×1 IMPLANT
BLADE SURG SZ10 CARB STEEL (BLADE) ×4 IMPLANT
COLLAR OFFSET CORAIL SZ 15 HIP (Stem) IMPLANT
CORAIL OFFSET COLLAR SZ 15 HIP (Stem) ×2 IMPLANT
COVER WAND RF STERILE (DRAPES) IMPLANT
DRAPE INCISE IOBAN 66X45 STRL (DRAPES) ×2 IMPLANT
DRAPE ORTHO SPLIT 77X108 STRL (DRAPES) ×4
DRAPE POUCH INSTRU U-SHP 10X18 (DRAPES) ×2 IMPLANT
DRAPE SHEET LG 3/4 BI-LAMINATE (DRAPES) ×2 IMPLANT
DRAPE STERI IOBAN 125X83 (DRAPES) ×1 IMPLANT
DRAPE SURG ORHT 6 SPLT 77X108 (DRAPES) ×2 IMPLANT
DRAPE U-SHAPE 47X51 STRL (DRAPES) ×2 IMPLANT
DRSG AQUACEL AG ADV 3.5X 4 (GAUZE/BANDAGES/DRESSINGS) ×1 IMPLANT
DRSG AQUACEL AG ADV 3.5X 6 (GAUZE/BANDAGES/DRESSINGS) ×1 IMPLANT
DURAPREP 26ML APPLICATOR (WOUND CARE) ×2 IMPLANT
ELECT REM PT RETURN 15FT ADLT (MISCELLANEOUS) ×2 IMPLANT
EVACUATOR 1/8 PVC DRAIN (DRAIN) ×2 IMPLANT
FACESHIELD WRAPAROUND (MASK) ×6 IMPLANT
FACESHIELD WRAPAROUND OR TEAM (MASK) ×3 IMPLANT
GAUZE XEROFORM 5X9 LF (GAUZE/BANDAGES/DRESSINGS) ×2 IMPLANT
GLOVE BIO SURGEON STRL SZ8 (GLOVE) ×4 IMPLANT
GLOVE ECLIPSE 7.5 STRL STRAW (GLOVE) ×4 IMPLANT
HEAD BIPOLAR PROS AML 46 (Hips) IMPLANT
HEAD FEM STD 28X+1.5 STRL (Hips) ×1 IMPLANT
HOOD PEEL AWAY FLYTE STAYCOOL (MISCELLANEOUS) ×4 IMPLANT
KIT BASIN OR (CUSTOM PROCEDURE TRAY) ×2 IMPLANT
KIT TURNOVER KIT A (KITS) IMPLANT
MANIFOLD NEPTUNE II (INSTRUMENTS) ×2 IMPLANT
NDL MA TROC 1/2 (NEEDLE) ×1 IMPLANT
NDL SAFETY ECLIPSE 18X1.5 (NEEDLE) ×1 IMPLANT
NEEDLE HYPO 18GX1.5 SHARP (NEEDLE) ×2
NEEDLE MA TROC 1/2 (NEEDLE) ×2 IMPLANT
NS IRRIG 1000ML POUR BTL (IV SOLUTION) ×2 IMPLANT
PACK TOTAL JOINT (CUSTOM PROCEDURE TRAY) ×2 IMPLANT
PASSER SUT SWANSON 36MM LOOP (INSTRUMENTS) ×2 IMPLANT
PROTECTOR NERVE ULNAR (MISCELLANEOUS) ×2 IMPLANT
STAPLER VISISTAT 35W (STAPLE) ×2 IMPLANT
STRIP CLOSURE SKIN 1/2X4 (GAUZE/BANDAGES/DRESSINGS) ×1 IMPLANT
SUCTION FRAZIER HANDLE 12FR (TUBING) ×1
SUCTION TUBE FRAZIER 12FR DISP (TUBING) ×1 IMPLANT
SUT ETHIBOND NAB CT1 #1 30IN (SUTURE) ×4 IMPLANT
SUT VIC AB 0 CT1 27 (SUTURE) ×2
SUT VIC AB 0 CT1 27XBRD ANTBC (SUTURE) ×2 IMPLANT
SUT VIC AB 1 CTX 36 (SUTURE) ×2
SUT VIC AB 1 CTX36XBRD ANBCTR (SUTURE) ×2 IMPLANT
SUT VIC AB 2-0 CT1 27 (SUTURE) ×2
SUT VIC AB 2-0 CT1 TAPERPNT 27 (SUTURE) ×2 IMPLANT
SYR CONTROL 10ML LL (SYRINGE) ×2 IMPLANT
TOWEL OR 17X26 10 PK STRL BLUE (TOWEL DISPOSABLE) ×2 IMPLANT
TRAY FOLEY MTR SLVR 16FR STAT (SET/KITS/TRAYS/PACK) ×2 IMPLANT
WATER STERILE IRR 1000ML POUR (IV SOLUTION) ×4 IMPLANT
YANKAUER SUCT BULB TIP 10FT TU (MISCELLANEOUS) ×2 IMPLANT

## 2019-09-03 NOTE — Anesthesia Procedure Notes (Signed)
Procedure Name: Intubation Date/Time: 09/03/2019 7:16 PM Performed by: Claudia Desanctis, CRNA Pre-anesthesia Checklist: Patient identified, Emergency Drugs available, Suction available and Patient being monitored Patient Re-evaluated:Patient Re-evaluated prior to induction Oxygen Delivery Method: Circle system utilized Preoxygenation: Pre-oxygenation with 100% oxygen Induction Type: IV induction Ventilation: Mask ventilation without difficulty Laryngoscope Size: 2 and Miller Grade View: Grade I Tube type: Oral Tube size: 7.0 mm Number of attempts: 1 Airway Equipment and Method: Stylet Placement Confirmation: ETT inserted through vocal cords under direct vision,  positive ETCO2 and breath sounds checked- equal and bilateral Secured at: 20 cm Tube secured with: Tape Dental Injury: Teeth and Oropharynx as per pre-operative assessment

## 2019-09-03 NOTE — ED Provider Notes (Signed)
[facility] DEPT Provider Note   CSN: [id number] Arrival date & time: 09/03/19  1523     History   Chief Complaint Chief Complaint  Patient presents with  . Hip Pain    Right    HPI Alicia Boyd is a 85 y.o. female.     The history is provided by the patient.  Hip Pain This is a new problem. The current episode started more than 2 days ago. The problem occurs daily. The problem has not changed since onset.Pertinent negatives include no chest pain, no abdominal pain, no headaches and no shortness of breath. Nothing aggravates the symptoms. Nothing relieves the symptoms. She has tried nothing for the symptoms. The treatment provided no relief.    Past Medical History:  Diagnosis Date  . Aortic heart murmur    AS/AI on 2D ECHO ; SBE prophylaxis Rxed  . Chest pain 2007 ; 2011   negative cardiac assessment  . Hyperlipidemia   . Interstitial cystitis    Resolved  . Osteoporosis    T score - 3.0 @ femoral neck; Fosamax affected swallowing  . Postmenopausal    No PMH or HRT    Patient Active Problem List   Diagnosis Date Noted  . Displaced fracture of right femoral neck ([place]) 09/03/2019  . Failure to thrive in adult 09/01/2019  . Quadriceps strain, right, initial encounter 09/01/2019  . Anxiety and depression 07/05/2019  . Greater trochanteric bursitis of left hip 06/22/2019  . Closed fracture of multiple pubic rami, right, initial encounter ([place]) 05/28/2019  . Greater trochanteric bursitis of right hip 01/05/2019  . Degenerative arthritis of left knee 04/16/2018  . Mild carpal tunnel syndrome of right wrist 09/24/2017  . Ecchymoses, spontaneous 07/29/2016  . Varicose veins 07/29/2016  . Nasal congestion 07/29/2016  . Hyperglycemia 07/12/2015  . Paresthesia of hand 03/22/2014  . Undiagnosed cardiac murmurs 03/22/2014  . Palpitations 03/03/2012  . ANEMIA, MILD 07/27/2010  . Venous (peripheral) insufficiency 07/27/2010  . OTHER  CONSTIPATION 04/17/2010  . Essential hypertension 04/06/2010  . RHINOCONJUNCTIVITIS, ALLERGIC 04/04/2010  . PERSONAL HISTORY OTHER DISORDER URINARY SYSTEM 01/25/2010  . OTHER DYSPHAGIA 04/11/2009  . ESOPHAGEAL REFLUX 01/24/2009  . OTHER AND UNSPECIFIED HYPERLIPIDEMIA 11/23/2008  . Osteoporosis 11/23/2008  . NONSPECIFIC ABNORMAL ELECTROCARDIOGRAM 11/23/2008    Past Surgical History:  Procedure Laterality Date  . COLONOSCOPY  2011   Dr Sharlett Iles     OB History   No obstetric history on file.      Home Medications    Prior to Admission medications   Medication Sig Start Date End Date Taking? Authorizing Provider  amLODipine (NORVASC) 5 MG tablet TAKE 1 TABLET BY MOUTH EVERY DAY Patient taking differently: Take 5 mg by mouth daily.  12/29/18   Nafziger, [name], NP  Calcium Carbonate (CALCIUM 600 PO) Take 1 tablet by mouth daily.     [provider]  Cholecalciferol (VITAMIN D3) 1000 UNITS CAPS Take 1 capsule by mouth daily.     [provider]  fexofenadine (ALLEGRA) 180 MG tablet Take 180 mg by mouth daily as needed for allergies.     [provider]  gabapentin (NEURONTIN) 100 MG capsule TAKE 1 CAPSULE (100 MG TOTAL) BY MOUTH AT BEDTIME. 07/15/19   Lyndal Pulley, DO  ipratropium (ATROVENT) 0.03 % nasal spray USE 2 SPRAYS IN EACH NOSTRIL EVERY 12 HOURS. DO NOT USE MORE THAN 5 DAYS Patient taking differently: Place 2 sprays into both nostrils 2 (two) times daily.  11/14/16   Binnie Rail, MD  metoprolol succinate (TOPROL-XL) 25 MG 24 hr tablet TAKE 1 TABLET BY MOUTH EVERY DAY Patient taking differently: Take 25 mg by mouth daily.  07/08/19   Nafziger, [name], NP  mirtazapine (REMERON) 7.5 MG tablet TAKE 1 TABLET (7.5 MG TOTAL) BY MOUTH AT BEDTIME. 07/14/19   Lyndal Pulley, DO  Multiple Vitamin (MULTIVITAMIN) capsule Take 1 capsule by mouth daily.      [provider]  Polyethyl Glycol-Propyl Glycol (SYSTANE OP) Apply 1 drop to eye daily as needed  (dry eyes).     [provider]  traMADol (ULTRAM) 50 MG tablet Take 1 tablet (50 mg total) by mouth every 6 (six) hours as needed. Patient taking differently: Take 50 mg by mouth every 6 (six) hours as needed for moderate pain.  05/05/19   Margarita Mail, PA-C    Family History Family History  Problem Relation Age of Onset  . Diabetes Father        Pancreatitic insufficiency post MVA  . Hypertension Mother   . Osteoporosis Mother   . Heart disease Mother        AF  . Thyroid disease Sister        hypothyroidism  . Coronary artery disease Sister        AF  . Stroke Sister 75       also pulmonary disease    Social History Social History   Tobacco Use  . Smoking status: Never Smoker  . Smokeless tobacco: Never Used  Substance Use Topics  . Alcohol use: No  . Drug use: No     Allergies   Amitid [amitriptyline hcl], Metronidazole, Penicillins, Alendronate sodium, Clarithromycin, Flonase [fluticasone propionate], Promethazine hcl, Robitussin (alcohol free) [guaifenesin], Spironolactone, and Singulair [montelukast sodium]   Review of Systems Review of Systems  Constitutional: Negative for chills and fever.  HENT: Negative for ear pain and sore throat.   Eyes: Negative for pain and visual disturbance.  Respiratory: Negative for cough and shortness of breath.   Cardiovascular: Negative for chest pain and palpitations.  Gastrointestinal: Negative for abdominal pain and vomiting.  Genitourinary: Negative for dysuria and hematuria.  Musculoskeletal: Positive for arthralgias (right). Negative for back pain.  Skin: Negative for color change and rash.  Neurological: Negative for seizures, syncope and headaches.  All other systems reviewed and are negative.    Physical Exam Updated Vital Signs  ED Triage Vitals  Enc Vitals Group     BP 09/03/19 1527 (!) 153/76     Pulse Rate 09/03/19 1527 (!) 103     Resp 09/03/19 1537 18     Temp 09/03/19 1537 97.7 F (36.5 C)      Temp Source 09/03/19 1537 Oral     SpO2 09/03/19 1527 100 %     Weight --      Height --      Head Circumference --      Peak Flow --      Pain Score --      Pain Loc --      Pain Edu? --      Excl. in [place]? --     Physical Exam Vitals signs and nursing note reviewed.  Constitutional:      General: She is not in acute distress.    Appearance: She is well-developed. She is not ill-appearing.  HENT:     Head: Normocephalic and atraumatic.     Nose: Nose normal.  Mouth/Throat:     Mouth: Mucous membranes are moist.  Eyes:     Extraocular Movements: Extraocular movements intact.     Conjunctiva/sclera: Conjunctivae normal.     Pupils: Pupils are equal, round, and reactive to light.  Neck:     Musculoskeletal: Normal range of motion and neck supple. No muscular tenderness.     Comments: Doppler pulse to b/l DP Cardiovascular:     Rate and Rhythm: Normal rate and regular rhythm.     Heart sounds: Normal heart sounds. No murmur.  Pulmonary:     Effort: Pulmonary effort is normal. No respiratory distress.     Breath sounds: Normal breath sounds.  Abdominal:     General: There is no distension.     Palpations: Abdomen is soft.     Tenderness: There is no abdominal tenderness.  Musculoskeletal:        General: Tenderness (right hip) and deformity present.     Right lower leg: Edema (2+) present.     Left lower leg: Edema (2+) present.  Skin:    General: Skin is warm and dry.     Capillary Refill: Capillary refill takes less than 2 seconds.  Neurological:     General: No focal deficit present.     Mental Status: She is alert.  Psychiatric:        Mood and Affect: Mood normal.      ED Treatments / Results  Labs (all labs ordered are listed, but only abnormal results are displayed) Labs Reviewed  SARS CORONAVIRUS 2 ([facility])  CBC WITH DIFFERENTIAL/PLATELET  BASIC METABOLIC PANEL    EKG None  Radiology No  results found.  Procedures Procedures (including critical care time)  Medications Ordered in ED Medications - No data to display   Initial Impression / Assessment and Plan / ED Course  I have reviewed the triage vital signs and the nursing notes.  Pertinent labs & imaging results that were available during my care of the patient were reviewed by me and considered in my medical decision making (see chart for details).     Alicia Boyd is an 85 year old female with history of high blood pressure presents the ED with right hip fracture.  Patient with unremarkable vitals.  Has had ongoing right hip pain for the last several days.  Had a recent pelvic fracture.  Has been undergoing physical therapy.  Had an x-ray yesterday that showed right hip fracture.  Was seen by orthopedics today who was sent the patient in for admission with hospitalist and surgery on the hip.  Hemodynamically patient is stable.  She has some right hip tenderness on exam.  Good pulses via Doppler bilaterally.  Patient to be admitted to hospital service for surgery and further care.  Hemodynamically stable throughout my care.  No other pain on exam.  Medical clearance labs have been ordered.  Coronavirus test ordered.  Hospitalist to follow-up.  This chart was dictated using voice recognition software.  Despite best efforts to proofread,  errors can occur which can change the documentation meaning.    Final Clinical Impressions(s) / ED Diagnoses   Final diagnoses:  Closed fracture of right hip, initial encounter [facility])    ED Discharge Orders    None       Lennice Sites, DO 09/03/19 1636

## 2019-09-03 NOTE — Transfer of Care (Signed)
Immediate Anesthesia Transfer of Care Note  Patient: Alicia Boyd  Procedure(s) Performed: ANTERIOR APPROACH HEMI HIP ARTHROPLASTY BIPOLAR (Right )  Patient Location: PACU  Anesthesia Type:General  Level of Consciousness: awake and patient cooperative  Airway & Oxygen Therapy: Patient Spontanous Breathing and Patient connected to face mask  Post-op Assessment: Report given to RN and Post -op Vital signs reviewed and stable  Post vital signs: Reviewed and stable  Last Vitals:  Vitals Value Taken Time  BP    Temp    Pulse    Resp    SpO2      Last Pain:  Vitals:   09/03/19 1807  TempSrc: Oral  PainSc: 0-No pain         Complications: No apparent anesthesia complications

## 2019-09-03 NOTE — Consult Note (Addendum)
Reason for Consult:Right hip pain Referring Physician: Hospitalists  JAILEI FREILICH is an 85 y.o. female.  HPI: The patient is a 85 year old female who was diagnosed earlier in the week with a displaced femoral neck fracture.  She attempted to be at home but was having severe pain.  She cannot stand, walk, sit, stand, or use the bathroom without severe pain.  She presented to my office with x-ray showing displaced femoral neck fracture and it was clear that she needed surgical intervention.  I sent her to the emergency room to be evaluated and admitted by the hospitalist for surgical intervention as soon as anesthesia would clear her from an nothing by mouth status.  Past Medical History:  Diagnosis Date  . Aortic heart murmur    AS/AI on 2D ECHO ; SBE prophylaxis Rxed  . Chest pain 2007 ; 2011   negative cardiac assessment  . Hyperlipidemia   . Interstitial cystitis    Resolved  . Osteoporosis    T score - 3.0 @ femoral neck; Fosamax affected swallowing  . Postmenopausal    No PMH or HRT    Past Surgical History:  Procedure Laterality Date  . COLONOSCOPY  2011   Dr Sharlett Iles    Family History  Problem Relation Age of Onset  . Diabetes Father        Pancreatitic insufficiency post MVA  . Hypertension Mother   . Osteoporosis Mother   . Heart disease Mother        AF  . Thyroid disease Sister        hypothyroidism  . Coronary artery disease Sister        AF  . Stroke Sister 77       also pulmonary disease    Social History:  reports that she has never smoked. She has never used smokeless tobacco. She reports that she does not drink alcohol or use drugs.  Allergies:  Allergies  Allergen Reactions  . Amitid [Amitriptyline Hcl]     Patient experienced a dizzy, spinning feeling   . Metronidazole     REACTION: throat swells  . Penicillins     REACTION: rash & fever  . Alendronate Sodium     REACTION: unspecified  . Clarithromycin     GI intolerance  . Flonase  [Fluticasone Propionate]     Slight Headache   . Promethazine Hcl     REACTION: unspecified  . Robitussin (Alcohol Free) [Guaifenesin] Other (See Comments)    unknown hyperactive  . Spironolactone     09/21/13 weakness in legs  . Singulair [Montelukast Sodium]     "spacey"; ineffective    Medications: I have reviewed the patient's current medications.  No results found for this or any previous visit (from the past 48 hour(s)).  No results found.  ROS  ROS: I have reviewed the patient's review of systems thoroughly and there are no positive responses as relates to the HPI. Blood pressure (!) 175/77, pulse 72, temperature 97.7 F (36.5 C), temperature source Oral, resp. rate 16, SpO2 99 %. Physical Exam Well-developed well-nourished patient in no acute distress. Alert and oriented x3 HEENT:within normal limits Cardiac: Regular rate and rhythm Pulmonary: Lungs clear to auscultation Abdomen: Soft and nontender.  Normal active bowel sounds  Musculoskeletal: (Right hip: Painful range of motion.  Limited range of motion.  Positive heel strike.  Positive Galeazzi sign while sitting. X-ray: Displaced femoral neck fracture with varus malalignment from x-rays taken 2 days ago.  I did  not repeat x-rays today as they would be of no help in decision-making. Assessment/Plan: 85 year old female with history of Onset of pain in the right hip several days ago.  She had x-rays taken which showed displaced femoral neck fracture and was sent to my office for routine consult.//X-rays and physical exam clearly suggest that she has need for interventional surgery.  Best choice for her will be hemiarthroplasty.  She has a caregiver that would like for this to be done through an anterior approach.She will need medical clearance by the internal medicine service once this has been accomplished we will be happy to take her to the operating roomI have had a prolonged discussion with the patient regarding the risk  and benefits of the surgical procedure.  The patient understands the risks include but are not limited to bleeding, infection and failure of the surgery to cure the problem and need for further surgery.  The patient understands there is a slight risk of death at the time of surgery.  The patient understands these risks along with the potential benefits and wishes to proceed with surgical intervention.  The patient will discuss the full surgical procedure with Colletta Maryland and Elmyra Ricks, our surgical schedulers, and the surgery will be set up at the patient's convenience.  The patient will be followed in the office in the postoperative period.  Alta Corning 09/03/2019, 4:35 PM

## 2019-09-03 NOTE — Discharge Instructions (Signed)

## 2019-09-03 NOTE — Plan of Care (Signed)
  Problem: Education: Goal: Knowledge of the prescribed therapeutic regimen will improve Outcome: Progressing   Problem: Pain Management: Goal: Pain level will decrease with appropriate interventions Outcome: Progressing   

## 2019-09-03 NOTE — ED Triage Notes (Signed)
Pt arrived POV to be evaluated for right hip pain. Pt states that she had a pelvic fx a couple months ago that has healed, but has been experiencing increased pain in her right hip. There is +3 pitting edema in bilateral lower legs, right leg is noticeably shorter than the left.

## 2019-09-03 NOTE — Anesthesia Preprocedure Evaluation (Addendum)
Anesthesia Evaluation  Patient identified by MRN, date of birth, ID band Patient awake    Reviewed: Allergy & Precautions, NPO status , Patient's Chart, lab work & pertinent test results, reviewed documented beta blocker date and time   History of Anesthesia Complications Negative for: history of anesthetic complications  Airway Mallampati: II  TM Distance: >3 FB Neck ROM: Full    Dental  (+) Dental Advisory Given, Teeth Intact   Pulmonary neg pulmonary ROS,    breath sounds clear to auscultation       Cardiovascular hypertension, Pt. on home beta blockers and Pt. on medications + Valvular Problems/Murmurs  Rhythm:Regular Rate:Normal   '13 TTE - Mild LVH. EF 60% to 65%. Grade 1 diastolic dysfunction. Mild AI and MR. LA mildly dilated.  Lower extremity pitting edema bilaterally. Patient denies any orthopnea or SOB with activity or at rest (prior to injury, was very independent in the home). Edema first noted several months ago. On amlodipine since prior to development of edema.    Neuro/Psych PSYCHIATRIC DISORDERS Anxiety Depression negative neurological ROS     GI/Hepatic Neg liver ROS, neg GERD  ,  Endo/Other  negative endocrine ROS  Renal/GU negative Renal ROS     Musculoskeletal  (+) Arthritis ,   Abdominal   Peds  Hematology negative hematology ROS (+)   Anesthesia Other Findings   Reproductive/Obstetrics                           Anesthesia Physical Anesthesia Plan  ASA: III  Anesthesia Plan: General   Post-op Pain Management:    Induction: Intravenous  PONV Risk Score and Plan: 4 or greater and Treatment may vary due to age or medical condition, Ondansetron and Propofol infusion  Airway Management Planned: Oral ETT  Additional Equipment: None  Intra-op Plan:   Post-operative Plan: Extubation in OR  Informed Consent: I have reviewed the patients History and Physical,  chart, labs and discussed the procedure including the risks, benefits and alternatives for the proposed anesthesia with the patient or authorized representative who has indicated his/her understanding and acceptance.   Patient has DNR.  Discussed DNR with patient and Continue DNR.   Dental advisory given  Plan Discussed with: CRNA and Anesthesiologist  Anesthesia Plan Comments: (Patient agreeable to IV fluids, vasopressors, and intubation for procedure. Patient does not want chest compressions under any circumstance.)      Anesthesia Quick Evaluation

## 2019-09-03 NOTE — H&P (Signed)
History and Physical    Alicia Boyd [id number] DOB: [birth year] DOA: 09/03/2019  PCP: Alicia Peng, NP Patient coming from: Home.  Ambulates independently at baseline.  Using walker for caution since pelvic fracture.  Chief Complaint: Right hip pain  HPI: Alicia Boyd is a 85 y.o. female with history of hypertension, osteoporosis and recent pelvic fracture about 2-3 months ago presenting with right hip pain.  History provided by patient's and patient's family (a very good friend and neighbor) at bedside.   Patient reports spontaneous onset of right hip pain for the last 1 week.  She has been ambulating on this.  She presented to sport medicine clinic about 2 days ago and had an x-ray that revealed acute right femoral neck fracture with slight angulation and displacement in addition to healing old right pubic fractures.  She was referred to orthopedic surgery.  She was seen by Dr. Dorna Boyd at [facility] orthopedics who sent her to ED for surgical repair.  Patient denies fall or trauma.  She says she had near fall incident while getting up off commode in the bathroom couple of days ago but had right hip pain prior to that.  She says she has been recovering from her pelvic fracture.  She has home health PT. she ambulates independently but uses walker for question.  She denies fever, chills, URI symptoms, chest pain, cough, palpitation, dizziness, nausea, vomiting, abdominal pain or urinary symptoms.  She denies focal numbness, tingling or weakness other than her chronic right-sided carpal tunnel.  She denies exertional chest pain or dyspnea.  Very active.  She denies history of heart disease, CHF, palpitation or irregular heartbeat, diabetes, stroke or CKD.  Lives by herself.  Recently lost her husband about 2 months ago.  Also lost his sister about 2 weeks ago.  Denies smoking cigarettes, drinking alcohol recreational drug use.  She states she had some solid food about 11 AM this  morning.  In ED, BP elevated to 175/77.  Otherwise, hemodynamically stable.  CBC not impressive.  BMP and COVID-19 pending.  EKG normal sinus rhythm without acute ischemic finding.  It was read as ST depression in lateral leads which appears to be chronic.   ROS All review of system negative except for pertinent positives and negatives as history of present illness above.  PMH Past Medical History:  Diagnosis Date   Aortic heart murmur    AS/AI on 2D ECHO ; SBE prophylaxis Rxed   Chest pain 2007 ; 2011   negative cardiac assessment   Hyperlipidemia    Interstitial cystitis    Resolved   Osteoporosis    T score - 3.0 @ femoral neck; Fosamax affected swallowing   Postmenopausal    No PMH or HRT   PSH Past Surgical History:  Procedure Laterality Date   COLONOSCOPY  2011   Dr Jenne Campus Family History  Problem Relation Age of Onset   Diabetes Father        Pancreatitic insufficiency post MVA   Hypertension Mother    Osteoporosis Mother    Heart disease Mother        AF   Thyroid disease Sister        hypothyroidism   Coronary artery disease Sister        AF   Stroke Sister 22       also pulmonary disease    Social Hx  reports that she has never smoked. She has never used smokeless tobacco.  She reports that she does not drink alcohol or use drugs.  Allergy Allergies  Allergen Reactions   Amitid [Amitriptyline Hcl]     Patient experienced a dizzy, spinning feeling    Metronidazole     REACTION: throat swells   Penicillins     REACTION: rash & fever   Alendronate Sodium     REACTION: unspecified   Clarithromycin     GI intolerance   Flonase [Fluticasone Propionate]     Slight Headache    Promethazine Hcl     REACTION: unspecified   Robitussin (Alcohol Free) [Guaifenesin] Other (See Comments)    unknown hyperactive   Spironolactone     09/21/13 weakness in legs   Singulair [Montelukast Sodium]     "spacey"; ineffective    Home Meds Prior to Admission medications   Medication Sig Start Date End Date Taking? Authorizing Provider  amLODipine (NORVASC) 5 MG tablet TAKE 1 TABLET BY MOUTH EVERY DAY Patient taking differently: Take 5 mg by mouth daily.  12/29/18   Nafziger, [name], NP  Calcium Carbonate (CALCIUM 600 PO) Take 1 tablet by mouth daily.     [provider]  Cholecalciferol (VITAMIN D3) 1000 UNITS CAPS Take 1 capsule by mouth daily.     [provider]  fexofenadine (ALLEGRA) 180 MG tablet Take 180 mg by mouth daily as needed for allergies.     [provider]  gabapentin (NEURONTIN) 100 MG capsule TAKE 1 CAPSULE (100 MG TOTAL) BY MOUTH AT BEDTIME. 07/15/19   Lyndal Pulley, DO  ipratropium (ATROVENT) 0.03 % nasal spray USE 2 SPRAYS IN EACH NOSTRIL EVERY 12 HOURS. DO NOT USE MORE THAN 5 DAYS Patient taking differently: Place 2 sprays into both nostrils 2 (two) times daily.  11/14/16   Binnie Rail, MD  metoprolol succinate (TOPROL-XL) 25 MG 24 hr tablet TAKE 1 TABLET BY MOUTH EVERY DAY Patient taking differently: Take 25 mg by mouth daily.  07/08/19   Nafziger, [name], NP  mirtazapine (REMERON) 7.5 MG tablet TAKE 1 TABLET (7.5 MG TOTAL) BY MOUTH AT BEDTIME. 07/14/19   Lyndal Pulley, DO  Multiple Vitamin (MULTIVITAMIN) capsule Take 1 capsule by mouth daily.      [provider]  Polyethyl Glycol-Propyl Glycol (SYSTANE OP) Apply 1 drop to eye daily as needed (dry eyes).     [provider]  traMADol (ULTRAM) 50 MG tablet Take 1 tablet (50 mg total) by mouth every 6 (six) hours as needed. Patient taking differently: Take 50 mg by mouth every 6 (six) hours as needed for moderate pain.  05/05/19   Margarita Mail, PA-C    Physical Exam: Vitals:   09/03/19 1537 09/03/19 1605 09/03/19 1610 09/03/19 1615  BP:  (!) 175/77    Pulse: 80 81 72 72  Resp: 18 14 16 16   Temp: 97.7 F (36.5 C)     TempSrc: Oral     SpO2: 96% 99% 100% 99%    GENERAL: No acute distress.   Appears well.  HEENT: MMM.  Vision and hearing grossly intact.  NECK: Supple.  No apparent JVD.  RESP:  No IWOB. Good air movement bilaterally. CVS:  RRR. Heart sounds normal.  ABD/GI/GU: Bowel sounds present. Soft. Non tender.  MSK/EXT:  Moves extremities. No apparent deformity or edema.  SKIN: no apparent skin lesion or wound NEURO: Awake, alert and oriented appropriately.  No gross deficit.  PSYCH: Calm. Normal affect.   Personally Reviewed Radiological Exams No results found.  Personally Reviewed Labs: CBC: Recent Labs  Lab 09/03/19 1552  WBC 6.6  NEUTROABS 5.1  HGB 12.2  HCT 37.0  MCV 97.1  PLT 0000000   Basic Metabolic Panel: No results for input(s): NA, K, CL, CO2, GLUCOSE, BUN, CREATININE, CALCIUM, MG, PHOS in the last 168 hours. GFR: CrCl cannot be calculated (Patient's most recent lab result is older than the maximum 21 days allowed.). Liver Function Tests: No results for input(s): AST, ALT, ALKPHOS, BILITOT, PROT, ALBUMIN in the last 168 hours. No results for input(s): LIPASE, AMYLASE in the last 168 hours. No results for input(s): AMMONIA in the last 168 hours. Coagulation Profile: No results for input(s): INR, PROTIME in the last 168 hours. Cardiac Enzymes: No results for input(s): CKTOTAL, CKMB, CKMBINDEX, TROPONINI in the last 168 hours. BNP (last 3 results) No results for input(s): PROBNP in the last 8760 hours. HbA1C: No results for input(s): HGBA1C in the last 72 hours. CBG: No results for input(s): GLUCAP in the last 168 hours. Lipid Profile: No results for input(s): CHOL, HDL, LDLCALC, TRIG, CHOLHDL, LDLDIRECT in the last 72 hours. Thyroid Function Tests: No results for input(s): TSH, T4TOTAL, FREET4, T3FREE, THYROIDAB in the last 72 hours. Anemia Panel: No results for input(s): VITAMINB12, FOLATE, FERRITIN, TIBC, IRON, RETICCTPCT in the last 72 hours. Urine analysis:    Component Value Date/Time   COLORURINE lt. yellow 01/25/2010 1147    APPEARANCEUR Cloudy 01/25/2010 1147   LABSPEC <1.005 01/25/2010 1147   PHURINE 8.0 01/25/2010 1147   HGBUR negative 01/25/2010 1147   BILIRUBINUR neg 03/19/2018 1433   PROTEINUR neg 03/19/2018 1433   UROBILINOGEN 0.2 03/19/2018 1433   UROBILINOGEN 0.2 01/25/2010 1147   NITRITE neg 03/19/2018 1433   NITRITE negative 01/25/2010 1147   LEUKOCYTESUR Negative 03/19/2018 1433    Sepsis Labs:  None  Personally Reviewed EKG:  Normal sinus rhythm with no acute ischemic finding.  Assessment/Plan Nontraumatic displaced fracture of right femoral neck:  History of osteoporosis History of pelvic fracture about 2 to 3 months ago -Orthopedic surgery, Dr. Berenice Primas to take patient to the OR tonight -Last eaten about 11 AM this morning.  She said she had some solid foods -Patient is low risk from cardiopulmonary standpoint.  Her main risk is the surgery itself. -Strict bedrest -Pain management with low-dose morphine cautiously.  She is opiate nave. -SCD for VT prophylaxis. -N.p.o. -Follow BMP/PT/INR.  History of hypertension: Blood pressure slightly elevated.  Likely related to pain.  On metoprolol at home. -IV metoprolol while n.p.o.  Bereavement: patient lost her husband 2 months ago and his sister about 2 weeks ago.  Got tearful talking about them -We will provide emotional support.  DVT prophylaxis: SCD for upcoming surgery tonight  Code Status: DNR/DNI.  She says she would like to join her husband when at this time. Family Communication: Family at bedside  Disposition Plan: Admit to [place] called: She was sent here by Dr. Berenice Primas (orthopedic surgery) Admission status: Inpatient.   Mercy Riding MD [facility]  If 7PM-7AM, please contact night-coverage [email or web address] Password [facility]  09/03/2019, 4:48 PM

## 2019-09-03 NOTE — ED Notes (Signed)
Per Ortho MD, states patient with a right femoral fracture-sending here for surgery-basic surgery labs, Covid test-MD stated he will notify hospitalist

## 2019-09-04 LAB — CBC
HCT: 32.5 % — ABNORMAL LOW (ref 36.0–46.0)
Hemoglobin: 10.5 g/dL — ABNORMAL LOW (ref 12.0–15.0)
MCH: 31.3 pg (ref 26.0–34.0)
MCHC: 32.3 g/dL (ref 30.0–36.0)
MCV: 97 fL (ref 80.0–100.0)
Platelets: 243 10*3/uL (ref 150–400)
RBC: 3.35 MIL/uL — ABNORMAL LOW (ref 3.87–5.11)
RDW: 12.2 % (ref 11.5–15.5)
WBC: 11.6 10*3/uL — ABNORMAL HIGH (ref 4.0–10.5)
nRBC: 0 % (ref 0.0–0.2)

## 2019-09-04 LAB — BASIC METABOLIC PANEL
Anion gap: 7 (ref 5–15)
BUN: 12 mg/dL (ref 8–23)
CO2: 28 mmol/L (ref 22–32)
Calcium: 8.5 mg/dL — ABNORMAL LOW (ref 8.9–10.3)
Chloride: 101 mmol/L (ref 98–111)
Creatinine, Ser: 0.42 mg/dL — ABNORMAL LOW (ref 0.44–1.00)
GFR calc Af Amer: >60 mL/min (ref >60)
GFR calc non Af Amer: >60 mL/min (ref >60)
Glucose, Bld: 188 mg/dL — ABNORMAL HIGH (ref 70–99)
Potassium: 3.2 mmol/L — ABNORMAL LOW (ref 3.5–5.1)
Sodium: 136 mmol/L (ref 135–145)

## 2019-09-04 NOTE — Progress Notes (Signed)
Subjective: 1 Day Post-Op Procedure(s) (LRB): ANTERIOR APPROACH HEMI HIP ARTHROPLASTY BIPOLAR (Right) Patient reports pain as mild.  Reports that her right hip feels better than it did preoperatively.  She is being seen by the therapist now and getting up to ambulate and get in the bedside chair.  Objective: Vital signs in last 24 hours: Temp:  [97.7 F (36.5 C)-99.6 F (37.6 C)] 98.4 F (36.9 C) (09/05 1032) Pulse Rate:  [66-103] 68 (09/05 1032) Resp:  [14-24] 14 (09/05 1032) BP: (129-196)/(54-110) 146/55 (09/05 1032) SpO2:  [96 %-100 %] 100 % (09/05 1032) Weight:  [47.6 kg] 47.6 kg (09/04 1807)  Intake/Output from previous day: 09/04 0701 - 09/05 0700 In: 1358.2 [P.O.:240; I.V.:918.2; IV Piggyback:200] Out: 2100 [Urine:1950; Blood:150] Intake/Output this shift: Total I/O In: 240 [P.O.:240] Out: -   Recent Labs    09/03/19 1552 09/04/19 0223  HGB 12.2 10.5*   Recent Labs    09/03/19 1552 09/04/19 0223  WBC 6.6 11.6*  RBC 3.81* 3.35*  HCT 37.0 32.5*  PLT 246 243   Recent Labs    09/03/19 1552 09/04/19 0223  NA 137 136  K 3.6 3.2*  CL 103 101  CO2 27 28  BUN 16 12  CREATININE 0.41* 0.42*  GLUCOSE 97 188*  CALCIUM 9.3 8.5*   Recent Labs    09/03/19 1825  INR 1.0   Right hip exam: Neurovascular intact Sensation intact distally Intact pulses distally Dorsiflexion/Plantar flexion intact Incision: dressing C/D/I Compartment soft Patient is alert and oriented today.   Assessment/Plan: 1 Day Post-Op Procedure(s) (LRB): ANTERIOR APPROACH HEMI HIP ARTHROPLASTY BIPOLAR (Right) Plan: Weight-bear as tolerated on right lower extremity without hip precautions. Aspirin 325 mg enteric-coated 1 daily x1 month postop for DVT prophylaxis. Up with therapy Discharge to SNF in 1 to 2 days.  Most likely this will be needed as she lives alone with caretakers coming into her.  She has a Designer, jewellery locally who is her friend/caretaker.  Her name is Pincus Badder.    Erlene Senters 09/04/2019, 12:23 PM

## 2019-09-04 NOTE — Progress Notes (Signed)
PROGRESS NOTE    Alicia Boyd  [id number] DOB: 06/12/1930 DOA: 09/03/2019 PCP: [name], NP   Brief Narrative: Alicia Boyd is a 85 y.o. emale with history of hypertension, osteoporosis and recent pelvic fracture about 2-3 months ago. Patient presented secondary to right hip pain and found to have a hip fracture.   Assessment & Plan:   Active Problems:   Essential hypertension   Displaced fracture of right femoral neck (HCC)   Right femoral neck fracture Displaced. Patient is s/p hemi hip arthroplasty on 09/03/2019. -Orthopedic surgery recommendations: WBAT, aspirin 325 mg daily x1 month, up with therapy -Social work consult for SNF placement  Essential hypertension -Continue metoprolol and amlodipine   DVT prophylaxis: Aspirin, SCDs Code Status:   Code Status: DNR Family Communication: None at bedside; neighbor at bedside Disposition Plan: Discharge to SNF when medically stable/pending bed   Consultants:   Orthopedic surgery  Procedures:   9/4: Right hemi hip arthroplasty  Antimicrobials:  None    Subjective: Some soreness of her hip. No other issues.  Objective: Vitals:   09/03/19 2313 09/04/19 0010 09/04/19 0120 09/04/19 0544  BP: (!) 176/69 (!) 149/56 (!) 152/54 129/65  Pulse: 70 72 67 70  Resp: 16 16 16 16   Temp: 98.2 F (36.8 C) 98.3 F (36.8 C) 98.2 F (36.8 C) 98.9 F (37.2 C)  TempSrc: Oral Oral Oral Oral  SpO2: 100% 100% 100% 99%  Weight:      Height:        Intake/Output Summary (Last 24 hours) at 09/04/2019 0959 Last data filed at 09/04/2019 0601 Gross per 24 hour  Intake 1358.2 ml  Output 2100 ml  Net -741.8 ml   Filed Weights   09/03/19 1757 09/03/19 1807  Weight: 47.6 kg 47.6 kg    Examination:  General exam: Appears calm and comfortable Respiratory system: Clear to auscultation. Respiratory effort normal. Cardiovascular system: S1 & S2 heard, RRR. No murmurs, rubs, gallops or clicks. Gastrointestinal system:  Abdomen is nondistended, soft and nontender. No organomegaly or masses felt. Normal bowel sounds heard. Central nervous system: Alert and oriented. No focal neurological deficits. Extremities: No edema. No calf tenderness Skin: No cyanosis. No rashes Psychiatry: Judgement and insight appear normal. Mood & affect appropriate.     Data Reviewed: I have personally reviewed following labs and imaging studies  CBC: Recent Labs  Lab 09/03/19 1552 09/04/19 0223  WBC 6.6 11.6*  NEUTROABS 5.1  --   HGB 12.2 10.5*  HCT 37.0 32.5*  MCV 97.1 97.0  PLT 246 0000000   Basic Metabolic Panel: Recent Labs  Lab 09/03/19 1552 09/04/19 0223  NA 137 136  K 3.6 3.2*  CL 103 101  CO2 27 28  GLUCOSE 97 188*  BUN 16 12  CREATININE 0.41* 0.42*  CALCIUM 9.3 8.5*   GFR: Estimated Creatinine Clearance: 37.9 mL/min (A) (by C-G formula based on SCr of 0.42 mg/dL (L)). Liver Function Tests: No results for input(s): AST, ALT, ALKPHOS, BILITOT, PROT, ALBUMIN in the last 168 hours. No results for input(s): LIPASE, AMYLASE in the last 168 hours. No results for input(s): AMMONIA in the last 168 hours. Coagulation Profile: Recent Labs  Lab 09/03/19 1825  INR 1.0   Cardiac Enzymes: No results for input(s): CKTOTAL, CKMB, CKMBINDEX, TROPONINI in the last 168 hours. BNP (last 3 results) No results for input(s): PROBNP in the last 8760 hours. HbA1C: No results for input(s): HGBA1C in the last 72 hours. CBG: No results for input(s):  GLUCAP in the last 168 hours. Lipid Profile: No results for input(s): CHOL, HDL, LDLCALC, TRIG, CHOLHDL, LDLDIRECT in the last 72 hours. Thyroid Function Tests: No results for input(s): TSH, T4TOTAL, FREET4, T3FREE, THYROIDAB in the last 72 hours. Anemia Panel: No results for input(s): VITAMINB12, FOLATE, FERRITIN, TIBC, IRON, RETICCTPCT in the last 72 hours. Sepsis Labs: No results for input(s): PROCALCITON, LATICACIDVEN in the last 168 hours.  Recent Results (from the  past 240 hour(s))  SARS Coronavirus 2 [facility] order, Performed in [facility] hospital lab) Nasopharyngeal Nasopharyngeal Swab     Status: None   Collection Time: 09/03/19  3:52 PM   Specimen: Nasopharyngeal Swab  Result Value Ref Range Status   SARS Coronavirus 2 NEGATIVE NEGATIVE Final    Comment: (NOTE) If result is NEGATIVE SARS-CoV-2 target nucleic acids are NOT DETECTED. The SARS-CoV-2 RNA is generally detectable in upper and lower  respiratory specimens during the acute phase of infection. The lowest  concentration of SARS-CoV-2 viral copies this assay can detect is 250  copies / mL. A negative result does not preclude SARS-CoV-2 infection  and should not be used as the sole basis for treatment or other  patient management decisions.  A negative result may occur with  improper specimen collection / handling, submission of specimen other  than nasopharyngeal swab, presence of viral mutation(s) within the  areas targeted by this assay, and inadequate number of viral copies  (<250 copies / mL). A negative result must be combined with clinical  observations, patient history, and epidemiological information. If result is POSITIVE SARS-CoV-2 target nucleic acids are DETECTED. The SARS-CoV-2 RNA is generally detectable in upper and lower  respiratory specimens dur ing the acute phase of infection.  Positive  results are indicative of active infection with SARS-CoV-2.  Clinical  correlation with patient history and other diagnostic information is  necessary to determine patient infection status.  Positive results do  not rule out bacterial infection or co-infection with other viruses. If result is PRESUMPTIVE POSTIVE SARS-CoV-2 nucleic acids MAY BE PRESENT.   A presumptive positive result was obtained on the submitted specimen  and confirmed on repeat testing.  While 2019 novel coronavirus  (SARS-CoV-2) nucleic acids may be present in the submitted sample  additional confirmatory  testing may be necessary for epidemiological  and / or clinical management purposes  to differentiate between  SARS-CoV-2 and other Sarbecovirus currently known to infect humans.  If clinically indicated additional testing with an alternate test  methodology [id number]) is advised. The SARS-CoV-2 RNA is generally  detectable in upper and lower respiratory sp ecimens during the acute  phase of infection. The expected result is Negative. Fact Sheet for Patients:  StrictlyIdeas.no Fact Sheet for Healthcare Providers: BankingDealers.co.za This test is not yet approved or cleared by the [place] FDA and has been authorized for detection and/or diagnosis of SARS-CoV-2 by FDA under an Emergency Use Authorization (EUA).  This EUA will remain in effect (meaning this test can be used) for the duration of the COVID-19 declaration under Section 564(b)(1) of the Act, 21 U.S.C. section 360bbb-3(b)(1), unless the authorization is terminated or revoked sooner. Performed at [facility], [place] [address] [place], [place] [address]          Radiology Studies: Dg C-arm 1-60 Min-no Report  Result Date: 09/03/2019 Fluoroscopy was utilized by the requesting physician.  No radiographic interpretation.   Dg Hip Operative Unilat W Or W/o Pelvis Right  Result Date: 09/03/2019 CLINICAL DATA:  Hip replacement EXAM: OPERATIVE RIGHT HIP (WITH PELVIS IF PERFORMED) 2 VIEWS TECHNIQUE: Fluoroscopic spot image(s) were submitted for interpretation post-operatively. COMPARISON:  09/01/2019 FINDINGS: The patient has undergone total hip arthroplasty on the right. The alignment appears near anatomic. There are expected postsurgical changes. IMPRESSION: Status post right total hip arthroplasty without complicating features. Electronically Signed   By: Constance Holster M.D.   On: 09/03/2019 20:49        Scheduled Meds: . amLODipine  5 mg Oral QPM   . aspirin EC  325 mg Oral Q breakfast  . docusate sodium  100 mg Oral BID  . ferrous sulfate  325 mg Oral Q breakfast  . metoprolol succinate  25 mg Oral Daily  . metoprolol tartrate  2.5 mg Intravenous Q6H  . multivitamin with minerals  1 tablet Oral Daily  . traMADol  50 mg Oral Q6H   Continuous Infusions: . sodium chloride 50 mL/hr at 09/04/19 0300  . methocarbamol (ROBAXIN) IV       LOS: 1 day     Cordelia Poche, MD [facility] 09/04/2019, 9:59 AM  If 7PM-7AM, please contact night-coverage [email or web address]

## 2019-09-04 NOTE — Plan of Care (Signed)
  Problem: Education: Goal: Knowledge of the prescribed therapeutic regimen will improve Outcome: Progressing   Problem: Education: Goal: Understanding of discharge needs will improve Outcome: Progressing   Problem: Pain Management: Goal: Pain level will decrease with appropriate interventions Outcome: Progressing   

## 2019-09-04 NOTE — Progress Notes (Signed)
   09/04/19 1400  PT Visit Information  Last PT Received On 09/04/19  Pt just back to bed with nursing staff, exercise focused session which pt tolerated well. incr pain with hip flexion and abduction--pt reported pain to only incr with activity  Assistance Needed +1  History of Present Illness 85 yo female adm iwth R hip fx s/p ANTERIOR APPROACH HEMI HIP ARTHROPLASTY BIPOLAR (Right)  PMH: hypertension, osteoporosis and recent pelvic fracture about 2-3 months ago  Precautions  Precautions Fall  Restrictions  Weight Bearing Restrictions No  Other Position/Activity Restrictions WBAT  Pain Assessment  Pain Assessment 0-10  Pain Score 4  Pain Location right hip  Pain Descriptors / Indicators Grimacing;Sore  Pain Intervention(s) Limited activity within patient's tolerance;Monitored during session;Ice applied  Cognition  Arousal/Alertness Awake/alert  Behavior During Therapy WFL for tasks assessed/performed  Overall Cognitive Status Within Functional Limits for tasks assessed  General Exercises - Lower Extremity  Ankle Circles/Pumps AROM;10 reps;Both  Quad Sets AROM;Both;10 reps  Short Arc Quad AROM;Right;10 reps  Hip ABduction/ADduction AROM;Right;10 reps  Heel Slides AROM;AAROM;Both;10 reps  PT - End of Session  Activity Tolerance Patient tolerated treatment well  Patient left in bed;with call bell/phone within reach;with bed alarm set   PT - Assessment/Plan  PT Plan Current plan remains appropriate  PT Visit Diagnosis Difficulty in walking, not elsewhere classified (R26.2);Muscle weakness (generalized) (M62.81);History of falling (Z91.81)  PT Frequency (ACUTE ONLY) Min 3X/week  Follow Up Recommendations SNF  PT equipment None recommended by PT  AM-PAC PT "6 Clicks" Mobility Outcome Measure (Version 2)  Help needed turning from your back to your side while in a flat bed without using bedrails? 3  Help needed moving from lying on your back to sitting on the side of a flat bed  without using bedrails? 3  Help needed moving to and from a bed to a chair (including a wheelchair)? 3  Help needed standing up from a chair using your arms (e.g., wheelchair or bedside chair)? 3  Help needed to walk in hospital room? 3  Help needed climbing 3-5 steps with a railing?  2  6 Click Score 17  Consider Recommendation of Discharge To: Home with [facility]  PT Goal Progression  Progress towards PT goals Progressing toward goals  Acute Rehab PT Goals  PT Goal Formulation With patient  Time For Goal Achievement 09/18/19  Potential to Achieve Goals Good  PT Time Calculation  PT Start Time (ACUTE ONLY) 1411  PT Stop Time (ACUTE ONLY) 1425  PT Time Calculation (min) (ACUTE ONLY) 14 min  PT General Charges  $$ ACUTE PT VISIT 1 Visit  PT Treatments  $Therapeutic Exercise 8-22 mins

## 2019-09-04 NOTE — Evaluation (Signed)
Physical Therapy Evaluation Patient Details Name: Alicia Boyd MRN: [id number] DOB: Dec 26, 1930 Today's Date: 09/04/2019   History of Present Illness  85 yo female adm iwth R hip fx s/p ANTERIOR APPROACH HEMI HIP ARTHROPLASTY BIPOLAR (Right)  PMH: hypertension, osteoporosis and recent pelvic fracture about 2-3 months ago  Clinical Impression  Pt admitted with above diagnosis.  Pt is independent at her baseline, does not drive. Pt will benefit from SNF post acute to maximize safety and independence prior to return home ; will follow in acute setting   Pt currently with functional limitations due to the deficits listed below (see PT Problem List). Pt will benefit from skilled PT to increase their independence and safety with mobility to allow discharge to the venue listed below.       Follow Up Recommendations SNF    Equipment Recommendations  None recommended by PT    Recommendations for Other Services       Precautions / Restrictions Precautions Precautions: Fall Restrictions Weight Bearing Restrictions: No Other Position/Activity Restrictions: WBAT      Mobility  Bed Mobility Overal bed mobility: Needs Assistance Bed Mobility: Supine to Sit     Supine to sit: Min assist     General bed mobility comments: assist with LEs, incr time  Transfers Overall transfer level: Needs assistance Equipment used: Rolling walker (2 wheeled) Transfers: Sit to/from Stand Sit to Stand: Min assist         General transfer comment: assist to rise and stabilize, cues for safety and hand placement  Ambulation/Gait Ambulation/Gait assistance: Min assist Gait Distance (Feet): 5 Feet Assistive device: Rolling walker (2 wheeled) Gait Pattern/deviations: Step-to pattern;Decreased stance time - right;Antalgic     General Gait Details: cues for sequence, RW position  Stairs            Wheelchair Mobility    Modified Rankin (Stroke Patients Only)       Balance Overall  balance assessment: Needs assistance;History of Falls Sitting-balance support: No upper extremity supported;Feet supported Sitting balance-Leahy Scale: Fair       Standing balance-Leahy Scale: Poor Standing balance comment: reliant on UEs and external assist                             Pertinent Vitals/Pain Pain Assessment: Faces Faces Pain Scale: Hurts little more Pain Location: right hip Pain Descriptors / Indicators: Grimacing;Sore Pain Intervention(s): Monitored during session;Limited activity within patient's tolerance    Home Living Family/patient expects to be discharged to:: Skilled nursing facility Living Arrangements: Alone             Home Equipment: None      Prior Function Level of Independence: Independent               Hand Dominance        Extremity/Trunk Assessment   Upper Extremity Assessment Upper Extremity Assessment: Generalized weakness    Lower Extremity Assessment Lower Extremity Assessment: RLE deficits/detail;Generalized weakness RLE Deficits / Details: limited by post op pain and weakness. hip AAROM grossly WFL within limits of pain       Communication   Communication: No difficulties  Cognition Arousal/Alertness: Awake/alert Behavior During Therapy: WFL for tasks assessed/performed Overall Cognitive Status: Within Functional Limits for tasks assessed  General Comments      Exercises General Exercises - Lower Extremity Ankle Circles/Pumps: AROM;10 reps;Both   Assessment/Plan    PT Assessment Patient needs continued PT services  PT Problem List Decreased strength;Decreased activity tolerance;Decreased mobility;Pain;Decreased knowledge of use of DME;Decreased balance       PT Treatment Interventions DME instruction;Gait training;Functional mobility training;Therapeutic activities;Therapeutic exercise;Patient/family education;Balance training    PT  Goals (Current goals can be found in the Care Plan section)  Acute Rehab PT Goals PT Goal Formulation: With patient Time For Goal Achievement: 09/18/19 Potential to Achieve Goals: Good    Frequency Min 3X/week   Barriers to discharge        Co-evaluation               AM-PAC PT "6 Clicks" Mobility  Outcome Measure Help needed turning from your back to your side while in a flat bed without using bedrails?: A Little Help needed moving from lying on your back to sitting on the side of a flat bed without using bedrails?: A Little Help needed moving to and from a bed to a chair (including a wheelchair)?: A Little Help needed standing up from a chair using your arms (e.g., wheelchair or bedside chair)?: A Little Help needed to walk in hospital room?: A Little Help needed climbing 3-5 steps with a railing? : A Lot 6 Click Score: 17    End of Session Equipment Utilized During Treatment: Gait belt Activity Tolerance: Patient tolerated treatment well Patient left: in chair;with call bell/phone within reach;with chair alarm set   PT Visit Diagnosis: Difficulty in walking, not elsewhere classified (R26.2);Muscle weakness (generalized) (M62.81);History of falling (Z91.81)    Time: [id number] PT Time Calculation (min) (ACUTE ONLY): 25 min   Charges:   PT Evaluation $PT Eval Low Complexity: 1 Low PT Treatments $Gait Training: 8-22 mins        Kenyon Ana, PT  Pager: [phone] Acute Rehab Dept [facility]): [phone]   09/04/2019   [facility] 09/04/2019, 1:16 PM

## 2019-09-05 LAB — CBC
HCT: 31.4 % — ABNORMAL LOW (ref 36.0–46.0)
Hemoglobin: 10.3 g/dL — ABNORMAL LOW (ref 12.0–15.0)
MCH: 32 pg (ref 26.0–34.0)
MCHC: 32.8 g/dL (ref 30.0–36.0)
MCV: 97.5 fL (ref 80.0–100.0)
Platelets: 229 10*3/uL (ref 150–400)
RBC: 3.22 MIL/uL — ABNORMAL LOW (ref 3.87–5.11)
RDW: 12.3 % (ref 11.5–15.5)
WBC: 11.2 10*3/uL — ABNORMAL HIGH (ref 4.0–10.5)
nRBC: 0 % (ref 0.0–0.2)

## 2019-09-05 LAB — BASIC METABOLIC PANEL
Anion gap: 8 (ref 5–15)
BUN: 12 mg/dL (ref 8–23)
CO2: 28 mmol/L (ref 22–32)
Calcium: 8.8 mg/dL — ABNORMAL LOW (ref 8.9–10.3)
Chloride: 98 mmol/L (ref 98–111)
Creatinine, Ser: 0.46 mg/dL (ref 0.44–1.00)
GFR calc Af Amer: >60 mL/min (ref >60)
GFR calc non Af Amer: >60 mL/min (ref >60)
Glucose, Bld: 119 mg/dL — ABNORMAL HIGH (ref 70–99)
Potassium: 3.4 mmol/L — ABNORMAL LOW (ref 3.5–5.1)
Sodium: 134 mmol/L — ABNORMAL LOW (ref 135–145)

## 2019-09-05 NOTE — Anesthesia Postprocedure Evaluation (Signed)
Anesthesia Post Note  Patient: Alicia Boyd  Procedure(s) Performed: ANTERIOR APPROACH HEMI HIP ARTHROPLASTY BIPOLAR (Right )     Patient location during evaluation: PACU Anesthesia Type: General Level of consciousness: awake and alert Pain management: pain level controlled Vital Signs Assessment: post-procedure vital signs reviewed and stable Respiratory status: spontaneous breathing, nonlabored ventilation, respiratory function stable and patient connected to nasal cannula oxygen Cardiovascular status: blood pressure returned to baseline and stable Postop Assessment: no apparent nausea or vomiting Anesthetic complications: no    Last Vitals:  Vitals:   09/04/19 2037 09/05/19 0615  BP: 136/65 137/61  Pulse: 76 80  Resp: 14 16  Temp: 37.3 C 36.8 C  SpO2: 95% 97%    Last Pain:  Vitals:   09/05/19 0615  TempSrc: Oral  PainSc:                  [name]

## 2019-09-05 NOTE — Progress Notes (Signed)
PROGRESS NOTE    JANESSA Boyd  [id number] DOB: [birth year] DOA: 09/03/2019 PCP: [name], NP   Brief Narrative: Alicia Boyd is a 85 y.o. emale with history of hypertension, osteoporosis and recent pelvic fracture about 2-3 months ago. Patient presented secondary to right hip pain and found to have a hip fracture.   Assessment & Plan:   Principal Problem:   Displaced fracture of right femoral neck (HCC) Active Problems:   Essential hypertension   Right femoral neck fracture Displaced. Patient is s/p hemi hip arthroplasty on 09/03/2019. -Orthopedic surgery recommendations: WBAT, aspirin 325 mg daily x1 month, up with therapy -Social work consult for SNF placement; will also consult inpatient rehab per patient request  Essential hypertension -Continue metoprolol and amlodipine   DVT prophylaxis: Aspirin, SCDs Code Status:   Code Status: DNR Family Communication: Friend on telephone Disposition Plan: Discharge to SNF when medically stable/pending bed   Consultants:   Orthopedic surgery  Inpatient rehab  Procedures:   9/4: Right hemi hip arthroplasty  Antimicrobials:  None    Subjective: Right hip soreness  Objective: Vitals:   09/04/19 1319 09/04/19 2037 09/05/19 0615 09/05/19 0813  BP: (!) 136/48 136/65 137/61 (!) 143/87  Pulse: 65 76 80 95  Resp: 15 14 16    Temp: 98.4 F (36.9 C) 99.2 F (37.3 C) 98.3 F (36.8 C)   TempSrc: Oral Oral Oral   SpO2: 97% 95% 97%   Weight:      Height:        Intake/Output Summary (Last 24 hours) at 09/05/2019 0959 Last data filed at 09/05/2019 0857 Gross per 24 hour  Intake 1064.14 ml  Output 675 ml  Net 389.14 ml   Filed Weights   09/03/19 1757 09/03/19 1807  Weight: 47.6 kg 47.6 kg    Examination:  General exam: Appears calm and comfortable Respiratory system: Clear to auscultation. Respiratory effort normal. Cardiovascular system: S1 & S2 heard, RRR. No murmurs, rubs, gallops or clicks.  Gastrointestinal system: Abdomen is nondistended, soft and nontender. No organomegaly or masses felt. Normal bowel sounds heard. Central nervous system: Alert. No focal neurological deficits. Extremities: No edema. No calf tenderness Skin: No cyanosis. No rashes Psychiatry: Judgement and insight appear normal. Mood & affect appropriate.      Data Reviewed: I have personally reviewed following labs and imaging studies  CBC: Recent Labs  Lab 09/03/19 1552 09/04/19 0223  WBC 6.6 11.6*  NEUTROABS 5.1  --   HGB 12.2 10.5*  HCT 37.0 32.5*  MCV 97.1 97.0  PLT 246 0000000   Basic Metabolic Panel: Recent Labs  Lab 09/03/19 1552 09/04/19 0223  NA 137 136  K 3.6 3.2*  CL 103 101  CO2 27 28  GLUCOSE 97 188*  BUN 16 12  CREATININE 0.41* 0.42*  CALCIUM 9.3 8.5*   GFR: Estimated Creatinine Clearance: 37.9 mL/min (A) (by C-G formula based on SCr of 0.42 mg/dL (L)). Liver Function Tests: No results for input(s): AST, ALT, ALKPHOS, BILITOT, PROT, ALBUMIN in the last 168 hours. No results for input(s): LIPASE, AMYLASE in the last 168 hours. No results for input(s): AMMONIA in the last 168 hours. Coagulation Profile: Recent Labs  Lab 09/03/19 1825  INR 1.0   Cardiac Enzymes: No results for input(s): CKTOTAL, CKMB, CKMBINDEX, TROPONINI in the last 168 hours. BNP (last 3 results) No results for input(s): PROBNP in the last 8760 hours. HbA1C: No results for input(s): HGBA1C in the last 72 hours. CBG: No results for input(s):  GLUCAP in the last 168 hours. Lipid Profile: No results for input(s): CHOL, HDL, LDLCALC, TRIG, CHOLHDL, LDLDIRECT in the last 72 hours. Thyroid Function Tests: No results for input(s): TSH, T4TOTAL, FREET4, T3FREE, THYROIDAB in the last 72 hours. Anemia Panel: No results for input(s): VITAMINB12, FOLATE, FERRITIN, TIBC, IRON, RETICCTPCT in the last 72 hours. Sepsis Labs: No results for input(s): PROCALCITON, LATICACIDVEN in the last 168 hours.  Recent  Results (from the past 240 hour(s))  SARS Coronavirus 2 [facility] order, Performed in [facility] hospital lab) Nasopharyngeal Nasopharyngeal Swab     Status: None   Collection Time: 09/03/19  3:52 PM   Specimen: Nasopharyngeal Swab  Result Value Ref Range Status   SARS Coronavirus 2 NEGATIVE NEGATIVE Final    Comment: (NOTE) If result is NEGATIVE SARS-CoV-2 target nucleic acids are NOT DETECTED. The SARS-CoV-2 RNA is generally detectable in upper and lower  respiratory specimens during the acute phase of infection. The lowest  concentration of SARS-CoV-2 viral copies this assay can detect is 250  copies / mL. A negative result does not preclude SARS-CoV-2 infection  and should not be used as the sole basis for treatment or other  patient management decisions.  A negative result may occur with  improper specimen collection / handling, submission of specimen other  than nasopharyngeal swab, presence of viral mutation(s) within the  areas targeted by this assay, and inadequate number of viral copies  (<250 copies / mL). A negative result must be combined with clinical  observations, patient history, and epidemiological information. If result is POSITIVE SARS-CoV-2 target nucleic acids are DETECTED. The SARS-CoV-2 RNA is generally detectable in upper and lower  respiratory specimens dur ing the acute phase of infection.  Positive  results are indicative of active infection with SARS-CoV-2.  Clinical  correlation with patient history and other diagnostic information is  necessary to determine patient infection status.  Positive results do  not rule out bacterial infection or co-infection with other viruses. If result is PRESUMPTIVE POSTIVE SARS-CoV-2 nucleic acids MAY BE PRESENT.   A presumptive positive result was obtained on the submitted specimen  and confirmed on repeat testing.  While 2019 novel coronavirus  (SARS-CoV-2) nucleic acids may be present in the submitted sample   additional confirmatory testing may be necessary for epidemiological  and / or clinical management purposes  to differentiate between  SARS-CoV-2 and other Sarbecovirus currently known to infect humans.  If clinically indicated additional testing with an alternate test  methodology [phone]) is advised. The SARS-CoV-2 RNA is generally  detectable in upper and lower respiratory sp ecimens during the acute  phase of infection. The expected result is Negative. Fact Sheet for Patients:  StrictlyIdeas.no Fact Sheet for Healthcare Providers: BankingDealers.co.za This test is not yet approved or cleared by the [place] FDA and has been authorized for detection and/or diagnosis of SARS-CoV-2 by FDA under an Emergency Use Authorization (EUA).  This EUA will remain in effect (meaning this test can be used) for the duration of the COVID-19 declaration under Section 564(b)(1) of the Act, 21 U.S.C. section 360bbb-3(b)(1), unless the authorization is terminated or revoked sooner. Performed at [facility], [place] [address] [place], [place] [address]          Radiology Studies: Dg C-arm 1-60 Min-no Report  Result Date: 09/03/2019 Fluoroscopy was utilized by the requesting physician.  No radiographic interpretation.   Dg Hip Operative Unilat W Or W/o Pelvis Right  Result Date: 09/03/2019 CLINICAL DATA:  Hip replacement EXAM: OPERATIVE RIGHT HIP (WITH PELVIS IF PERFORMED) 2 VIEWS TECHNIQUE: Fluoroscopic spot image(s) were submitted for interpretation post-operatively. COMPARISON:  09/01/2019 FINDINGS: The patient has undergone total hip arthroplasty on the right. The alignment appears near anatomic. There are expected postsurgical changes. IMPRESSION: Status post right total hip arthroplasty without complicating features. Electronically Signed   By: Constance Holster M.D.   On: 09/03/2019 20:49        Scheduled Meds: .  amLODipine  5 mg Oral QPM  . aspirin EC  325 mg Oral Q breakfast  . docusate sodium  100 mg Oral BID  . ferrous sulfate  325 mg Oral Q breakfast  . metoprolol succinate  25 mg Oral Daily  . multivitamin with minerals  1 tablet Oral Daily  . traMADol  50 mg Oral Q6H   Continuous Infusions: . methocarbamol (ROBAXIN) IV       LOS: 2 days     Cordelia Poche, MD [facility] 09/05/2019, 9:59 AM  If 7PM-7AM, please contact night-coverage [email or web address]

## 2019-09-05 NOTE — NC FL2 (Signed)
[place] LEVEL OF CARE SCREENING TOOL     IDENTIFICATION  Patient Name: Alicia Boyd Birthdate: [birth year] Sex: female Admission Date (Current Location): 09/03/2019  [facility] and [place] Number:  Herbalist and Address:  [facility],  [place] [address], [place]      Provider Number:    Attending Physician Name and Address:  Mariel Aloe, MD  Relative Name and Phone Number:  carolyn cook [phone]    Current Level of Care: Hospital Recommended Level of Care: [place] Prior Approval Number:    Date Approved/Denied:   PASRR Number: [id number]  Discharge Plan: SNF    Current Diagnoses: Patient Active Problem List   Diagnosis Date Noted  . Displaced fracture of right femoral neck ([place]) 09/03/2019  . Failure to thrive in adult 09/01/2019  . Quadriceps strain, right, initial encounter 09/01/2019  . Anxiety and depression 07/05/2019  . Greater trochanteric bursitis of left hip 06/22/2019  . Closed fracture of multiple pubic rami, right, initial encounter ([place]) 05/28/2019  . Greater trochanteric bursitis of right hip 01/05/2019  . Degenerative arthritis of left knee 04/16/2018  . Mild carpal tunnel syndrome of right wrist 09/24/2017  . Ecchymoses, spontaneous 07/29/2016  . Varicose veins 07/29/2016  . Nasal congestion 07/29/2016  . Hyperglycemia 07/12/2015  . Paresthesia of hand 03/22/2014  . Undiagnosed cardiac murmurs 03/22/2014  . Palpitations 03/03/2012  . ANEMIA, MILD 07/27/2010  . Venous (peripheral) insufficiency 07/27/2010  . OTHER CONSTIPATION 04/17/2010  . Essential hypertension 04/06/2010  . RHINOCONJUNCTIVITIS, ALLERGIC 04/04/2010  . PERSONAL HISTORY OTHER DISORDER URINARY SYSTEM 01/25/2010  . OTHER DYSPHAGIA 04/11/2009  . ESOPHAGEAL REFLUX 01/24/2009  . OTHER AND UNSPECIFIED HYPERLIPIDEMIA 11/23/2008  . Osteoporosis 11/23/2008  . NONSPECIFIC ABNORMAL ELECTROCARDIOGRAM 11/23/2008     Orientation RESPIRATION BLADDER Height & Weight     Self, Time, Situation, Place  Normal Continent Weight: 105 lb (47.6 kg) Height:  5\' 4"  (162.6 cm)  BEHAVIORAL SYMPTOMS/MOOD NEUROLOGICAL BOWEL NUTRITION STATUS      Continent Diet  AMBULATORY STATUS COMMUNICATION OF NEEDS Skin   Extensive Assist Verbally Normal                       Personal Care Assistance Level of Assistance  Bathing, Feeding, Dressing Bathing Assistance: Limited assistance Feeding assistance: Independent Dressing Assistance: Limited assistance     Functional Limitations Info  Sight, Hearing, Speech Sight Info: Adequate Hearing Info: Adequate Speech Info: Adequate    SPECIAL CARE FACTORS FREQUENCY  PT (By licensed PT), OT (By licensed OT)     PT Frequency: 5x per week OT Frequency: 5x per week            Contractures Contractures Info: Not present    Additional Factors Info  Code Status Code Status Info: DNR             Current Medications (09/05/2019):  This is the current hospital active medication list Current Facility-Administered Medications  Medication Dose Route Frequency Provider Last Rate Last Dose  . acetaminophen (TYLENOL) tablet 325-650 mg  325-650 mg Oral Q6H PRN Gary Fleet, PA-C   650 mg at 09/05/19 1349  . alum & mag hydroxide-simeth (MAALOX/MYLANTA) 200-200-20 MG/5ML suspension 30 mL  30 mL Oral Q4H PRN Gary Fleet, PA-C      . amLODipine (NORVASC) tablet 5 mg  5 mg Oral QPM Gary Fleet, PA-C   5 mg at 09/04/19 1816  . aspirin EC tablet 325  mg  325 mg Oral Q breakfast Gary Fleet, PA-C   325 mg at 09/05/19 0813  . bisacodyl (DULCOLAX) EC tablet 5 mg  5 mg Oral Daily PRN Gary Fleet, PA-C      . docusate sodium (COLACE) capsule 100 mg  100 mg Oral BID Gary Fleet, PA-C   100 mg at 09/05/19 0813  . ferrous sulfate tablet 325 mg  325 mg Oral Q breakfast Gary Fleet, PA-C   325 mg at 09/05/19 0813  . HYDROcodone-acetaminophen (NORCO/VICODIN) 5-325  MG per tablet 1 tablet  1 tablet Oral Q6H PRN Gary Fleet, PA-C   1 tablet at 09/04/19 1044  . magnesium citrate solution 1 Bottle  1 Bottle Oral Once PRN Gary Fleet, PA-C      . methocarbamol (ROBAXIN) tablet 500 mg  500 mg Oral Q6H PRN Gary Fleet, PA-C   500 mg at 09/05/19 1349   Or  . methocarbamol (ROBAXIN) 500 mg in dextrose 5 % 50 mL IVPB  500 mg Intravenous Q6H PRN Gary Fleet, PA-C      . metoprolol succinate (TOPROL-XL) 24 hr tablet 25 mg  25 mg Oral Daily Gary Fleet, PA-C   25 mg at 09/05/19 0813  . morphine 2 MG/ML injection 1 mg  1 mg Intravenous Q2H PRN Gary Fleet, PA-C      . multivitamin with minerals tablet 1 tablet  1 tablet Oral Daily Gary Fleet, PA-C   1 tablet at 09/05/19 0813  . ondansetron (ZOFRAN) tablet 4 mg  4 mg Oral Q6H PRN Gary Fleet, PA-C       Or  . ondansetron [facility]) injection 4 mg  4 mg Intravenous Q6H PRN Gary Fleet, PA-C      . polyethylene glycol (MIRALAX / GLYCOLAX) packet 17 g  17 g Oral Daily PRN Gary Fleet, PA-C         Discharge Medications: Please see discharge summary for a list of discharge medications.  Relevant Imaging Results:  Relevant Lab Results:   Additional Information    Treyshawn Muldrew, [place], [place]

## 2019-09-05 NOTE — Progress Notes (Signed)
Physical Therapy Treatment Patient Details Name: Alicia Boyd MRN: [id number] DOB: [birth year] Today's Date: 09/05/2019    History of Present Illness 85 yo female adm iwth R hip fx s/p ANTERIOR APPROACH HEMI HIP ARTHROPLASTY BIPOLAR (Right)  PMH: hypertension, osteoporosis and recent pelvic fracture about 2-3 months ago    PT Comments    Pt progressing toward PT goals. incr amb distance today although continues to fatigue easily.  Will benefit from SNF post acute to maximize independence    Follow Up Recommendations  SNF     Equipment Recommendations  None recommended by PT    Recommendations for Other Services       Precautions / Restrictions Precautions Precautions: Fall Restrictions Other Position/Activity Restrictions: WBAT    Mobility  Bed Mobility Overal bed mobility: Needs Assistance Bed Mobility: Supine to Sit     Supine to sit: Mod assist     General bed mobility comments: assist with LEs, incr time  Transfers Overall transfer level: Needs assistance Equipment used: Rolling walker (2 wheeled) Transfers: Sit to/from [facility] Sit to Stand: Min assist;Mod assist Stand pivot transfers: Min assist       General transfer comment: assist to rise and stabilize, cues for safety and hand placement  Ambulation/Gait Ambulation/Gait assistance: Min assist;+2 safety/equipment Gait Distance (Feet): 20 Feet Assistive device: Rolling walker (2 wheeled) Gait Pattern/deviations: Step-to pattern;Decreased stance time - right;Antalgic Gait velocity: decr   General Gait Details: cues for sequence, RW position, chair follow for safety (pt fatigues quickly)   Stairs             Wheelchair Mobility    Modified Rankin (Stroke Patients Only)       Balance Overall balance assessment: Needs assistance;History of Falls Sitting-balance support: No upper extremity supported;Feet supported Sitting balance-Leahy Scale: Fair Sitting balance -  Comments: wt shifting difficult d/t hip pain   Standing balance support: Bilateral upper extremity supported;During functional activity Standing balance-Leahy Scale: Poor Standing balance comment: reliant on UEs and external assist                            Cognition Arousal/Alertness: Awake/alert Behavior During Therapy: WFL for tasks assessed/performed Overall Cognitive Status: Within Functional Limits for tasks assessed                                        Exercises General Exercises - Lower Extremity Ankle Circles/Pumps: AROM;10 reps;Both    General Comments        Pertinent Vitals/Pain Faces Pain Scale: Hurts little more Pain Location: right hip Pain Descriptors / Indicators: Grimacing;Sore Pain Intervention(s): Limited activity within patient's tolerance;Monitored during session;Premedicated before session;Repositioned;Ice applied    Home Living                      Prior Function            PT Goals (current goals can now be found in the care plan section) Acute Rehab PT Goals PT Goal Formulation: With patient Time For Goal Achievement: 09/18/19 Potential to Achieve Goals: Good Progress towards PT goals: Progressing toward goals    Frequency    Min 3X/week      PT Plan Current plan remains appropriate    Co-evaluation              AM-PAC PT "6  Clicks" Mobility   Outcome Measure  Help needed turning from your back to your side while in a flat bed without using bedrails?: A Little Help needed moving from lying on your back to sitting on the side of a flat bed without using bedrails?: A Lot Help needed moving to and from a bed to a chair (including a wheelchair)?: A Little Help needed standing up from a chair using your arms (e.g., wheelchair or bedside chair)?: A Little Help needed to walk in hospital room?: A Little Help needed climbing 3-5 steps with a railing? : A Lot 6 Click Score: 16    End of  Session Equipment Utilized During Treatment: Gait belt Activity Tolerance: Patient tolerated treatment well;Patient limited by fatigue Patient left: in chair;with call bell/phone within reach;with chair alarm set   PT Visit Diagnosis: Difficulty in walking, not elsewhere classified (R26.2);Muscle weakness (generalized) (M62.81);History of falling (Z91.81)     Time: [id number] PT Time Calculation (min) (ACUTE ONLY): 19 min  Charges:  $Therapeutic Activity: 8-22 mins                     Kenyon Ana, PT  Pager: [phone] Acute Rehab Dept [facility]): [id number]   09/05/2019    [facility] 09/05/2019, 2:30 PM

## 2019-09-05 NOTE — Progress Notes (Signed)
Subjective: 2 Days Post-Op Procedure(s) (LRB): ANTERIOR APPROACH HEMI HIP ARTHROPLASTY BIPOLAR (Right) Patient reports pain as mild.  She does have some hip pain when up.  She is eating lunch.  Her appetite is decreased.  Objective: Vital signs in last 24 hours: Temp:  [98.3 F (36.8 C)-99.2 F (37.3 C)] 98.3 F (36.8 C) (09/06 0615) Pulse Rate:  [65-95] 95 (09/06 0813) Resp:  [14-16] 16 (09/06 0615) BP: (136-143)/(48-87) 143/87 (09/06 0813) SpO2:  [95 %-97 %] 97 % (09/06 0615)  Intake/Output from previous day: 09/05 0701 - 09/06 0700 In: 1064.1 [P.O.:720; I.V.:294.1; IV Piggyback:50] Out: 575 [Urine:575] Intake/Output this shift: Total I/O In: 240 [P.O.:240] Out: 100 [Urine:100]  Recent Labs    09/03/19 1552 09/04/19 0223 09/05/19 1008  HGB 12.2 10.5* 10.3*   Recent Labs    09/04/19 0223 09/05/19 1008  WBC 11.6* 11.2*  RBC 3.35* 3.22*  HCT 32.5* 31.4*  PLT 243 229   Recent Labs    09/04/19 0223 09/05/19 1008  NA 136 134*  K 3.2* 3.4*  CL 101 98  CO2 28 28  BUN 12 12  CREATININE 0.42* 0.46  GLUCOSE 188* 119*  CALCIUM 8.5* 8.8*   Recent Labs    09/03/19 1825  INR 1.0  Right hip exam:  Neurovascular intact Sensation intact distally Intact pulses distally Dorsiflexion/Plantar flexion intact Incision: dressing C/D/I Compartment soft   Assessment/Plan: 2 Days Post-Op Procedure(s) (LRB): ANTERIOR APPROACH HEMI HIP ARTHROPLASTY BIPOLAR (Right) Plan: Aspirin 325 mg 1 daily with SCDs for DVT prophylaxis. Weight-bear as tolerated on right no hip precautions. Up with therapy Discharge to SNF  Patient lives near [facility] so has interest for that.  She had a family loss at [facility] so does not want to go there. She will need to follow-up with Dr. Berenice Primas in 2 weeks at the office. When discharged would use tramadol 50 mg 1 every 8 hours as needed for pain.      Erlene Senters 09/05/2019, 12:22 PM

## 2019-09-05 NOTE — TOC Initial Note (Signed)
Transition of Care [facility]) - Initial/Assessment Note    Patient Details  Name: Alicia Boyd MRN: [id number] Date of Birth: [birth year]  Transition of [facility]) CM/SW Contact:    Elliot Gurney [place], [place] Phone Number: 09/05/2019, 1:29 PM  Clinical Narrative:                 Alicia Boyd is a 85 y.o. femalewith history a history  ofhypertension,  osteoporosisandrecent pelvic fracture about 2-52months ago. Patient presented secondary to right hip pain and found to have a hip fracture Per patient she lives alone and has had private duty aids through [employer] provide her with in home assistance 3x per day. Patient is willing to go to rehab but would like to return home and resume in home care with [facility]. This social worker discussed the recommendation for skilled rehab care post discharge from the hospital and reviewed placement process. Patient would like a facility close to home and would rather not have [place] as an option. She would consider [facility] if at all possible.  This Education officer, museum to complete FL2 and will sent out patient's clinical information for bed search.  Expected Discharge Plan: Skilled Nursing Facility Barriers to Discharge: No Barriers Identified   Patient Goals and CMS Choice Patient states their goals for this hospitalization and ongoing recovery are:: "I would like to go back home" CMS Medicare.gov Compare Post Acute Care list provided to:: Patient Choice offered to / list presented to : Patient  Expected Discharge Plan and Services Expected Discharge Plan: [place] In-house Referral: Clinical Social Work   Post Acute Care Choice: [place] Living arrangements for the past 2 months: Single Family Home([facility])                                      Prior Living Arrangements/Services Living arrangements for the past 2 months: Single Family Home([facility]) Lives with:: Self Patient language and need for interpreter reviewed:: Yes Do you feel safe going back to the place where you live?: Yes        Care giver support system in place?: Yes (comment) Current home services: DME, Other (comment)([facility]) Criminal Activity/Legal Involvement Pertinent to Current Situation/Hospitalization: No - Comment as needed  Activities of Daily Living Home Assistive Devices/Equipment: Shower chair with back, Environmental consultant (specify type), Grab bars in shower ADL Screening (condition at time of admission) Patient's cognitive ability adequate to safely complete daily activities?: Yes Is the patient deaf or have difficulty hearing?: No Does the patient have difficulty seeing, even when wearing glasses/contacts?: No Does the patient have difficulty concentrating, remembering, or making decisions?: No Patient able to express need for assistance with ADLs?: Yes Does the patient have difficulty dressing or bathing?: Yes Independently performs ADLs?: No Communication: Independent Dressing (OT): Needs assistance Is this a change from baseline?: Pre-admission baseline Grooming: Independent Feeding: Independent Bathing: Needs assistance Is this a change from baseline?: Pre-admission baseline Toileting: Needs assistance Is this a change from baseline?: Pre-admission baseline In/Out Bed: Independent Walks in Home: Independent with device (comment) Does the patient have difficulty walking or climbing stairs?: Yes Weakness of Legs: Both Weakness of Arms/Hands: None  Permission Sought/Granted   Permission granted to share information with : Yes, Verbal Permission Granted  Share Information with NAME: carolyn  cook     Permission granted to share info  w Relationship: family friend  Permission granted to share info w Contact Information: [phone]  Emotional Assessment   Attitude/Demeanor/Rapport: Engaged Affect (typically  observed): Adaptable, Calm Orientation: : Oriented to Self, Oriented to Place, Oriented to  Time Alcohol / Substance Use: Never Used Psych Involvement: No (comment)  Admission diagnosis:  Closed fracture of right hip, initial encounter [facility]) [S72.001A] Patient Active Problem List   Diagnosis Date Noted  . Displaced fracture of right femoral neck ([place]) 09/03/2019  . Failure to thrive in adult 09/01/2019  . Quadriceps strain, right, initial encounter 09/01/2019  . Anxiety and depression 07/05/2019  . Greater trochanteric bursitis of left hip 06/22/2019  . Closed fracture of multiple pubic rami, right, initial encounter ([place]) 05/28/2019  . Greater trochanteric bursitis of right hip 01/05/2019  . Degenerative arthritis of left knee 04/16/2018  . Mild carpal tunnel syndrome of right wrist 09/24/2017  . Ecchymoses, spontaneous 07/29/2016  . Varicose veins 07/29/2016  . Nasal congestion 07/29/2016  . Hyperglycemia 07/12/2015  . Paresthesia of hand 03/22/2014  . Undiagnosed cardiac murmurs 03/22/2014  . Palpitations 03/03/2012  . ANEMIA, MILD 07/27/2010  . Venous (peripheral) insufficiency 07/27/2010  . OTHER CONSTIPATION 04/17/2010  . Essential hypertension 04/06/2010  . RHINOCONJUNCTIVITIS, ALLERGIC 04/04/2010  . PERSONAL HISTORY OTHER DISORDER URINARY SYSTEM 01/25/2010  . OTHER DYSPHAGIA 04/11/2009  . ESOPHAGEAL REFLUX 01/24/2009  . OTHER AND UNSPECIFIED HYPERLIPIDEMIA 11/23/2008  . Osteoporosis 11/23/2008  . NONSPECIFIC ABNORMAL ELECTROCARDIOGRAM 11/23/2008   PCP:  [name], NP Pharmacy:   [id number] - [place], [place] [address] Phone: [phone] Fax: [phone]     Social Determinants of Health (SDOH) Interventions    Readmission Risk Interventions No flowsheet data found.

## 2019-09-06 MED ORDER — ENSURE ENLIVE PO LIQD
237.0000 mL | Freq: Two times a day (BID) | ORAL | Status: DC
  Administered 2019-09-06 – 2019-09-09 (×7): 237 mL via ORAL

## 2019-09-06 MED ORDER — POLYETHYLENE GLYCOL 3350 17 G PO PACK
17.0000 g | PACK | Freq: Two times a day (BID) | ORAL | Status: DC
  Administered 2019-09-06 – 2019-09-08 (×3): 17 g via ORAL
  Filled 2019-09-06 (×5): qty 1

## 2019-09-06 NOTE — Op Note (Signed)
NAME: Alicia Boyd, Alicia Boyd MEDICAL RECORD [id number] ACCOUNT [id number] DATE OF BIRTH:[birth year] FACILITY: [facility] LOCATION: [facility] PHYSICIAN:Samanthamarie Ezzell L. Avir Deruiter, MD  OPERATIVE REPORT  DATE OF PROCEDURE:  09/03/2019  PREOPERATIVE DIAGNOSIS:  Displaced femoral neck fracture, right.  POSTOPERATIVE DIAGNOSIS:  Displaced femoral neck fracture, right.  PROCEDURE: 1.  Left hemiarthroplasty with a bipolar from an anterior approach, right side.  This was done with a Corail stem size 14, a 46 mm bipolar ball with a 28 mm internal diameter head. 2.  Interpretation of multiple intraoperative fluoroscopic images.  SURGEON:  [name], MD  ASSISTANT:  Gaspar Skeeters PA-C, was present for the entire case and assisted by manipulation of the leg and closing to minimize OR time.  BRIEF HISTORY:  The patient is an 85 year old female who lives alone but with significant assistance who was seen earlier in the week where x-rays were taken by an outside physician.  His x-ray showed a displaced femoral neck fracture, and she was sent  to my office for a routine consult.  Immediately at looking at her x-rays, I understood that she needed surgical intervention.  I sent her directly to the hospital for evaluation and admission by the hospitalist and clearance for surgery.  Once cleared,  we took her to the operating room for right hemiarthroplasty.  Given her age and situation, I felt an anterior approach was appropriate, and I have had better success with bipolar than monopolar arthroplasty from anterior approach.  DESCRIPTION OF PROCEDURE:  The patient was taken to the operating room after adequate anesthesia was obtained with a general anesthetic.  The patient was placed supine on the Hana bed.  All bony prominences were well padded.  Attention was then turned to  the right hip where after routine prep and drape, an incision was made for an anterior approach to the hip.  Subcutaneous tissue was dissected down to the  level of the tensor fascia.  Tensor fascia was opened and finger dissected.  Retractors were put  in place above and below the neck, and the anterior capsule was opened and tagged.  A provisional neck cut was made.  At this point, the acetabulum was fractured, ball was removed, and the acetabulum as well as a napkin ring neck cut, which had just been  made.  At this point, we irrigated the acetabulum thoroughly.  We sized the ball on the back table.  It sized to 46.  A 46 ball and a stick were assessed, and this gave excellent fit and fill of the acetabulum.  At this point, attention was turned to  the stem side where the hip was externally rotated and extended and adducted.  We then opened the canal with a cookie cutter followed by a chili pepper followed by sequential rasping up to a level of 11 and did a trial reduction.  It looked like we were  reasonable in terms of leg length with this.  We had extreme difficulty in getting the hip ball back and dislocated.  At that point, we finally got it.  We dislocated and then went back to the stem side.  The 11 was a little bit loose.  In fact, a lot  loose, and we knew that from the radiographic interpretation because the stem was a little bit of varus.  We lateralized and then upsized and got to 14.  At that point, we opened the 14 high offset, which is what we trialled with based on the  preoperative x-rays, and put  the shortest neck ball on and did a reduction.  Excellent fit and fill of the stem side was achieved, and the patient had symmetric leg lengths.  At this point, the wound was irrigated, suctioned dry.  The anterior capsule  was closed with 1 Vicryl running, the tensor fascia was closed with 0 Vicryl running, the skin with a 2-0 Vicryl and 3-0 Monocryl subcuticular.  Benzoin and Steri-Strips were applied.  A sterile compressive dressing was applied, and patient was taken to  recovery room.  She was noted to be in satisfactory condition.  Estimated  blood loss for the procedure can be gotten from the anesthetic record.  It was approximately 250 mL.  Of note, fluoroscopy was used throughout the case to help with assessment of  sizing of the stem as well as leg length.  Gaspar Skeeters was present for the entire case and assisted by retraction, manipulation of the leg, and closing to minimize OR time.  LN/NUANCE  D:09/05/2019 T:09/05/2019 JOB:[id number]

## 2019-09-06 NOTE — Progress Notes (Signed)
Physical Therapy Treatment Patient Details Name: Alicia Boyd MRN: [id number] DOB: 1930/01/08 Today's Date: 09/06/2019    History of Present Illness 85 yo female adm iwth R hip fx s/p ANTERIOR APPROACH HEMI HIP ARTHROPLASTY BIPOLAR (Right)  PMH: hypertension, osteoporosis and recent pelvic fracture about 2-3 months ago    PT Comments    Pt progressing toward PT goals. Pt assisted with set up for brushing teeth per her request (as she stated she had not  brushed her teeth "in days", pt also given wash cloths/set up for   Washing hands and face). Will continue follow in acute setting. Continue to recommend SNF post acute.   Follow Up Recommendations  SNF     Equipment Recommendations  None recommended by PT    Recommendations for Other Services       Precautions / Restrictions Precautions Precautions: Fall Restrictions Weight Bearing Restrictions: No Other Position/Activity Restrictions: WBAT    Mobility  Bed Mobility               General bed mobility comments: pt in chair  Transfers Overall transfer level: Needs assistance Equipment used: Rolling walker (2 wheeled) Transfers: Sit to/from Stand Sit to Stand: Min guard         General transfer comment: assist to rise and stabilize, cues for safety and hand placement  Ambulation/Gait Ambulation/Gait assistance: Min guard;Min assist Gait Distance (Feet): 55 Feet Assistive device: Rolling walker (2 wheeled) Gait Pattern/deviations: Step-to pattern;Decreased stance time - right;Antalgic;Leaning posteriorly;Decreased step length - right;Decreased step length - left Gait velocity: decr   General Gait Details: cues for RW position and sequence    Stairs             Wheelchair Mobility    Modified Rankin (Stroke Patients Only)       Balance Overall balance assessment: Needs assistance;History of Falls Sitting-balance support: No upper extremity supported;Feet supported Sitting balance-Leahy  Scale: Fair Sitting balance - Comments: wt shifting difficult d/t hip pain   Standing balance support: Bilateral upper extremity supported;During functional activity Standing balance-Leahy Scale: Poor Standing balance comment: reliant on UEs and external assist for dynamic activity                            Cognition Arousal/Alertness: Awake/alert Behavior During Therapy: WFL for tasks assessed/performed Overall Cognitive Status: Within Functional Limits for tasks assessed                                        Exercises General Exercises - Lower Extremity Ankle Circles/Pumps: AROM;10 reps;Both Long Arc Quad: AROM;Right;10 reps;Seated    General Comments        Pertinent Vitals/Pain Pain Assessment: Faces Pain Score: 5  Faces Pain Scale: Hurts a little bit Pain Location: right hip Pain Descriptors / Indicators: Grimacing;Sore Pain Intervention(s): Limited activity within patient's tolerance;Premedicated before session;Monitored during session;Ice applied    Home Living Family/patient expects to be discharged to:: Skilled nursing facility Living Arrangements: Alone           Home Equipment: None      Prior Function Level of Independence: Independent          PT Goals (current goals can now be found in the care plan section) Acute Rehab PT Goals Patient Stated Goal: get back home with [facility] PT Goal Formulation: With patient Time For  Goal Achievement: 09/18/19 Potential to Achieve Goals: Good Progress towards PT goals: Progressing toward goals    Frequency    Min 3X/week      PT Plan Current plan remains appropriate    Co-evaluation              AM-PAC PT "6 Clicks" Mobility   Outcome Measure  Help needed turning from your back to your side while in a flat bed without using bedrails?: A Little Help needed moving from lying on your back to sitting on the side of a flat bed without using bedrails?: A  Little Help needed moving to and from a bed to a chair (including a wheelchair)?: A Little Help needed standing up from a chair using your arms (e.g., wheelchair or bedside chair)?: A Little Help needed to walk in hospital room?: A Little Help needed climbing 3-5 steps with a railing? : A Lot 6 Click Score: 17    End of Session Equipment Utilized During Treatment: Gait belt Activity Tolerance: Patient tolerated treatment well Patient left: in chair;with call bell/phone within reach;with chair alarm set   PT Visit Diagnosis: Difficulty in walking, not elsewhere classified (R26.2);Muscle weakness (generalized) (M62.81);History of falling (Z91.81)     Time: [id number] PT Time Calculation (min) (ACUTE ONLY): 20 min  Charges:  $Gait Training: 8-22 mins                     Kenyon Ana, PT  Pager: [phone] Acute Rehab Dept [facility]): [phone]   09/06/2019    [facility] 09/06/2019, 3:34 PM

## 2019-09-06 NOTE — Progress Notes (Signed)
Subjective: 3 Days Post-Op Procedure(s) (LRB): ANTERIOR APPROACH HEMI HIP ARTHROPLASTY BIPOLAR (Right) Patient reports pain as mild and She continues to have some pain but certainly better than before surgery when she walks.    Objective: Vital signs in last 24 hours: Temp:  [98 F (36.7 C)-99.2 F (37.3 C)] 99.2 F (37.3 C) (09/07 0527) Pulse Rate:  [68-131] 84 (09/07 0527) Resp:  [14-16] 15 (09/07 0527) BP: (103-143)/(60-88) 121/60 (09/07 0527) SpO2:  [91 %-98 %] 94 % (09/07 0527)  Intake/Output from previous day: 09/06 0701 - 09/07 0700 In: 720 [P.O.:720] Out: 450 [Urine:450] Intake/Output this shift: No intake/output data recorded.  Recent Labs    09/03/19 1552 09/04/19 0223 09/05/19 1008  HGB 12.2 10.5* 10.3*   Recent Labs    09/04/19 0223 09/05/19 1008  WBC 11.6* 11.2*  RBC 3.35* 3.22*  HCT 32.5* 31.4*  PLT 243 229   Recent Labs    09/04/19 0223 09/05/19 1008  NA 136 134*  K 3.2* 3.4*  CL 101 98  CO2 28 28  BUN 12 12  CREATININE 0.42* 0.46  GLUCOSE 188* 119*  CALCIUM 8.5* 8.8*   Recent Labs    09/03/19 1825  INR 1.0    Neurologically intact ABD soft Neurovascular intact Sensation intact distally No cellulitis present Compartment soft   Assessment/Plan: 3 Days Post-Op Procedure(s) (LRB): ANTERIOR APPROACH HEMI HIP ARTHROPLASTY BIPOLAR (Right) Advance diet Up with therapy D/C IV fluids Discharge to SNF We will add Ensure twice a day while in hospital I reviewed exercises with her today at length for bed mobility.  Just based on her overall situation at this point I think she will need skilled nursing discharge for 7 to 10 days.     Alta Corning 09/06/2019, 8:01 AM

## 2019-09-06 NOTE — TOC Progression Note (Signed)
Transition of Care [facility]) - Progression Note    Patient Details  Name: Alicia Boyd MRN: [id number] Date of Birth: [birth year]  [facility] CM/SW Contact  Leeroy Cha, RN Phone Number: 09/06/2019, 11:10 AM  Clinical Narrative:    Patient would likme to go to [facility] snf-fl2 faxed to facility.   Expected Discharge Plan: [place] Barriers to Discharge: No Barriers Identified  Expected Discharge Plan and Services Expected Discharge Plan: [place] In-house Referral: Clinical Social Work   Post Acute Care Choice: [place] Living arrangements for the past 2 months: Single Family Home([facility])                                       Social Determinants of Health (SDOH) Interventions    Readmission Risk Interventions No flowsheet data found.

## 2019-09-06 NOTE — Evaluation (Signed)
Occupational Therapy Evaluation Patient Details Name: Alicia Boyd MRN: [id number] DOB: 11-13-1930 Today's Date: 09/06/2019    History of Present Illness 85 yo female adm iwth R hip fx s/p ANTERIOR APPROACH HEMI HIP ARTHROPLASTY BIPOLAR (Right)  PMH: hypertension, osteoporosis and recent pelvic fracture about 2-3 months ago   Clinical Impression   Pt admitted with fall in which she did break her hip. Pt currently with functional limitations due to the deficits listed below (see OT Problem List).  Pt will benefit from skilled OT to increase their safety and independence with ADL and functional mobility for ADL to facilitate discharge to venue listed below.   Pt will need ST SNF in order to regain I  And get back to a level that she can be home for comfort keepers to A her. Pt agreeable.     Follow Up Recommendations  SNF    Equipment Recommendations  None recommended by OT    Recommendations for Other Services       Precautions / Restrictions Precautions Precautions: Fall Restrictions Other Position/Activity Restrictions: WBAT      Mobility Bed Mobility               General bed mobility comments: pt in chair  Transfers Overall transfer level: Needs assistance Equipment used: Rolling walker (2 wheeled) Transfers: Sit to/from Stand Sit to Stand: Mod assist         General transfer comment: assist to rise and stabilize, cues for safety and hand placement    Balance Overall balance assessment: Needs assistance;History of Falls Sitting-balance support: No upper extremity supported;Feet supported Sitting balance-Leahy Scale: Fair Sitting balance - Comments: wt shifting difficult d/t hip pain   Standing balance support: Bilateral upper extremity supported;During functional activity Standing balance-Leahy Scale: Poor Standing balance comment: reliant on UEs and external assist                           ADL either performed or assessed with  clinical judgement   ADL Overall ADL's : Needs assistance/impaired Eating/Feeding: Set up;Sitting   Grooming: Set up;Sitting   Upper Body Bathing: Set up;Sitting   Lower Body Bathing: Maximal assistance;Sit to/from stand;Cueing for compensatory techniques;Cueing for safety;Cueing for sequencing   Upper Body Dressing : Set up;Sitting   Lower Body Dressing: Maximal assistance;Sit to/from stand;Cueing for compensatory techniques;Cueing for safety;Cueing for sequencing       Toileting- Clothing Manipulation and Hygiene: Moderate assistance;Sit to/from stand;Cueing for compensatory techniques;Cueing for safety;Cueing for sequencing Toileting - Clothing Manipulation Details (indicate cue type and reason): VC for hand placement       General ADL Comments: pt did perform sit to stand from chair.  VC needed for hand placement.  Pts pain increased this day.  RN aware                  Pertinent Vitals/Pain Pain Score: 5  Pain Location: right hip           Communication Communication Communication: No difficulties   Cognition Arousal/Alertness: Awake/alert Behavior During Therapy: WFL for tasks assessed/performed Overall Cognitive Status: Within Functional Limits for tasks assessed                                                Home Living Family/patient expects to be discharged to:: Skilled nursing  facility Living Arrangements: Alone                           Home Equipment: None          Prior Functioning/Environment Level of Independence: Independent                 OT Problem List: Decreased strength;Decreased range of motion;Impaired balance (sitting and/or standing);Pain      OT Treatment/Interventions: Self-care/ADL training;Patient/family education;DME and/or AE instruction    OT Goals(Current goals can be found in the care plan section) Acute Rehab OT Goals Patient Stated Goal: get back home with comfort keepers OT  Goal Formulation: With patient Time For Goal Achievement: 09/13/19 Potential to Achieve Goals: Good  OT Frequency: Min 2X/week   Barriers to D/C:               AM-PAC OT "6 Clicks" Daily Activity     Outcome Measure Help from another person eating meals?: None Help from another person taking care of personal grooming?: None Help from another person toileting, which includes using toliet, bedpan, or urinal?: A Lot Help from another person bathing (including washing, rinsing, drying)?: A Lot Help from another person to put on and taking off regular upper body clothing?: A Little Help from another person to put on and taking off regular lower body clothing?: A Lot 6 Click Score: 17   End of Session Equipment Utilized During Treatment: Rolling walker;Gait belt Nurse Communication: Mobility status  Activity Tolerance: Patient limited by pain Patient left: in chair;with call bell/phone within reach;with chair alarm set  OT Visit Diagnosis: Unsteadiness on feet (R26.81);Other abnormalities of gait and mobility (R26.89);Muscle weakness (generalized) (M62.81);Repeated falls (R29.6)                Time: [id number] OT Time Calculation (min): 15 min Charges:  OT General Charges $OT Visit: 1 Visit OT Evaluation $OT Eval Low Complexity: 1 Low  Kari Baars, OT Acute Rehabilitation Services [phone] Office- [phone]     Harris Penton, Edwena Felty D 09/06/2019, 2:59 PM

## 2019-09-06 NOTE — Progress Notes (Signed)
Inpatient Rehabilitation-Admissions Coordinator   Noted PT recommends SNF. As pt lives alone and fatigues quickly, [facility] agrees with therapy recommendation for SNF placement for rehab.   AC will sign off and contact CM/SW to communicate recommendation.   Jhonnie Garner, OTR/L  Rehab Admissions Coordinator  [phone] 09/06/2019 2:02 PM

## 2019-09-06 NOTE — Progress Notes (Signed)
PROGRESS NOTE    Alicia Boyd  [id number] DOB: 1930/01/30 DOA: 09/03/2019 PCP: [name], NP   Brief Narrative: Alicia Boyd is a 85 y.o. emale with history of hypertension, osteoporosis and recent pelvic fracture about 2-3 months ago. Patient presented secondary to right hip pain and found to have a hip fracture.   Assessment & Plan:   Principal Problem:   Displaced fracture of right femoral neck (HCC) Active Problems:   Essential hypertension   Right femoral neck fracture Displaced. Patient is s/p hemi hip arthroplasty on 09/03/2019. -Orthopedic surgery recommendations: WBAT, aspirin 325 mg daily x1 month, up with therapy -Inpatient rehab recommendations pending -SW for likely SNF placement  Essential hypertension -Continue metoprolol and amlodipine  Abdominal distension No bowel movement. She has been having good urine output -Schedule MiraLAX rather than PRN -Bladder scan to ensure no urinary retention   DVT prophylaxis: Aspirin, SCDs Code Status:   Code Status: DNR Family Communication: None Disposition Plan: Discharge to [facility] (if they accept). Medically stable for discharge.   Consultants:   Orthopedic surgery  Inpatient rehab  Procedures:   9/4: Right hemi hip arthroplasty  Antimicrobials:  None    Subjective: Some hip soreness. Worse when ambulating. No other issues.  Objective: Vitals:   09/05/19 1640 09/05/19 2141 09/06/19 0527 09/06/19 0836  BP: 114/88 125/61 121/60 105/78  Pulse: 68 90 84 83  Resp: 14 14 15 14   Temp:  98 F (36.7 C) 99.2 F (37.3 C) 98.2 F (36.8 C)  TempSrc:  Oral Oral Oral  SpO2: 93% 98% 94% 97%  Weight:      Height:        Intake/Output Summary (Last 24 hours) at 09/06/2019 1008 Last data filed at 09/06/2019 0819 Gross per 24 hour  Intake 480 ml  Output 650 ml  Net -170 ml   Filed Weights   09/03/19 1757 09/03/19 1807  Weight: 47.6 kg 47.6 kg    Examination:  General exam: Appears  calm and comfortable Respiratory system: Clear to auscultation. Respiratory effort normal. Cardiovascular system: S1 & S2 heard, RRR. No murmurs, rubs, gallops or clicks. Gastrointestinal system: Abdomen is distended, soft and nontender. No organomegaly or masses felt. Normal bowel sounds heard. Central nervous system: Alert and oriented. No focal neurological deficits. Extremities: No edema. No calf tenderness Skin: No cyanosis. No rashes Psychiatry: Judgement and insight appear normal. Mood & affect appropriate.       Data Reviewed: I have personally reviewed following labs and imaging studies  CBC: Recent Labs  Lab 09/03/19 1552 09/04/19 0223 09/05/19 1008  WBC 6.6 11.6* 11.2*  NEUTROABS 5.1  --   --   HGB 12.2 10.5* 10.3*  HCT 37.0 32.5* 31.4*  MCV 97.1 97.0 97.5  PLT 246 243 [id number]   Basic Metabolic Panel: Recent Labs  Lab 09/03/19 1552 09/04/19 0223 09/05/19 1008  NA 137 136 134*  K 3.6 3.2* 3.4*  CL 103 101 98  CO2 27 28 28   GLUCOSE 97 188* 119*  BUN 16 12 12   CREATININE 0.41* 0.42* 0.46  CALCIUM 9.3 8.5* 8.8*   GFR: Estimated Creatinine Clearance: 37.9 mL/min (by C-G formula based on SCr of 0.46 mg/dL). Liver Function Tests: No results for input(s): AST, ALT, ALKPHOS, BILITOT, PROT, ALBUMIN in the last 168 hours. No results for input(s): LIPASE, AMYLASE in the last 168 hours. No results for input(s): AMMONIA in the last 168 hours. Coagulation Profile: Recent Labs  Lab 09/03/19 1825  INR 1.0  Cardiac Enzymes: No results for input(s): CKTOTAL, CKMB, CKMBINDEX, TROPONINI in the last 168 hours. BNP (last 3 results) No results for input(s): PROBNP in the last 8760 hours. HbA1C: No results for input(s): HGBA1C in the last 72 hours. CBG: No results for input(s): GLUCAP in the last 168 hours. Lipid Profile: No results for input(s): CHOL, HDL, LDLCALC, TRIG, CHOLHDL, LDLDIRECT in the last 72 hours. Thyroid Function Tests: No results for input(s): TSH,  T4TOTAL, FREET4, T3FREE, THYROIDAB in the last 72 hours. Anemia Panel: No results for input(s): VITAMINB12, FOLATE, FERRITIN, TIBC, IRON, RETICCTPCT in the last 72 hours. Sepsis Labs: No results for input(s): PROCALCITON, LATICACIDVEN in the last 168 hours.  Recent Results (from the past 240 hour(s))  SARS Coronavirus 2 [facility] order, Performed in [facility] hospital lab) Nasopharyngeal Nasopharyngeal Swab     Status: None   Collection Time: 09/03/19  3:52 PM   Specimen: Nasopharyngeal Swab  Result Value Ref Range Status   SARS Coronavirus 2 NEGATIVE NEGATIVE Final    Comment: (NOTE) If result is NEGATIVE SARS-CoV-2 target nucleic acids are NOT DETECTED. The SARS-CoV-2 RNA is generally detectable in upper and lower  respiratory specimens during the acute phase of infection. The lowest  concentration of SARS-CoV-2 viral copies this assay can detect is 250  copies / mL. A negative result does not preclude SARS-CoV-2 infection  and should not be used as the sole basis for treatment or other  patient management decisions.  A negative result may occur with  improper specimen collection / handling, submission of specimen other  than nasopharyngeal swab, presence of viral mutation(s) within the  areas targeted by this assay, and inadequate number of viral copies  (<250 copies / mL). A negative result must be combined with clinical  observations, patient history, and epidemiological information. If result is POSITIVE SARS-CoV-2 target nucleic acids are DETECTED. The SARS-CoV-2 RNA is generally detectable in upper and lower  respiratory specimens dur ing the acute phase of infection.  Positive  results are indicative of active infection with SARS-CoV-2.  Clinical  correlation with patient history and other diagnostic information is  necessary to determine patient infection status.  Positive results do  not rule out bacterial infection or co-infection with other viruses. If result is  PRESUMPTIVE POSTIVE SARS-CoV-2 nucleic acids MAY BE PRESENT.   A presumptive positive result was obtained on the submitted specimen  and confirmed on repeat testing.  While 2019 novel coronavirus  (SARS-CoV-2) nucleic acids may be present in the submitted sample  additional confirmatory testing may be necessary for epidemiological  and / or clinical management purposes  to differentiate between  SARS-CoV-2 and other Sarbecovirus currently known to infect humans.  If clinically indicated additional testing with an alternate test  methodology [phone]) is advised. The SARS-CoV-2 RNA is generally  detectable in upper and lower respiratory sp ecimens during the acute  phase of infection. The expected result is Negative. Fact Sheet for Patients:  StrictlyIdeas.no Fact Sheet for Healthcare Providers: BankingDealers.co.za This test is not yet approved or cleared by the [place] FDA and has been authorized for detection and/or diagnosis of SARS-CoV-2 by FDA under an Emergency Use Authorization (EUA).  This EUA will remain in effect (meaning this test can be used) for the duration of the COVID-19 declaration under Section 564(b)(1) of the Act, 21 U.S.C. section 360bbb-3(b)(1), unless the authorization is terminated or revoked sooner. Performed at [facility], [place] [address], [place], [place] [address]  Radiology Studies: No results found.      Scheduled Meds: . amLODipine  5 mg Oral QPM  . aspirin EC  325 mg Oral Q breakfast  . docusate sodium  100 mg Oral BID  . feeding supplement (ENSURE ENLIVE)  237 mL Oral BID BM  . ferrous sulfate  325 mg Oral Q breakfast  . metoprolol succinate  25 mg Oral Daily  . multivitamin with minerals  1 tablet Oral Daily   Continuous Infusions: . methocarbamol (ROBAXIN) IV       LOS: 3 days     Cordelia Poche, MD [facility] 09/06/2019, 10:08 AM  If  7PM-7AM, please contact night-coverage [email or web address]

## 2019-09-07 ENCOUNTER — Encounter (HOSPITAL_COMMUNITY): Payer: Self-pay | Admitting: Orthopedic Surgery

## 2019-09-07 NOTE — Progress Notes (Signed)
PROGRESS NOTE    Alicia Boyd  [id number] DOB: 13-Sep-1930 DOA: 09/03/2019 PCP: [name], NP   Brief Narrative: Alicia Boyd is a 85 y.o. emale with history of hypertension, osteoporosis and recent pelvic fracture about 2-3 months ago. Patient presented secondary to right hip pain and found to have a hip fracture.   Assessment & Plan:   Principal Problem:   Displaced fracture of right femoral neck (HCC) Active Problems:   Essential hypertension   Right femoral neck fracture Displaced. Patient is s/p hemi hip arthroplasty on 09/03/2019. CIR evaluated and patient not recommended. -Orthopedic surgery recommendations: WBAT, aspirin 325 mg EC daily x1 month, up with therapy; 2 week f/u with Dr. Berenice Primas -SW for likely SNF placement  Essential hypertension -Continue metoprolol and amlodipine  Abdominal distension Appears improved. -Schedule MiraLAX   DVT prophylaxis: Aspirin, SCDs Code Status:   Code Status: DNR Family Communication: None Disposition Plan: Discharge to SNF. Medically stable for discharge.   Consultants:   Orthopedic surgery  Inpatient rehab  Procedures:   9/4: Right hemi hip arthroplasty  Antimicrobials:  None    Subjective: Hip is not very sore but she is scared that it will get painful when ambulating.  Objective: Vitals:   09/06/19 1400 09/06/19 1656 09/06/19 2111 09/07/19 0628  BP: 112/80 (!) 142/60 (!) 120/54 129/66  Pulse: 85 92 84 72  Resp: 15 14 18 14   Temp: 98.6 F (37 C) 98.3 F (36.8 C) 98.3 F (36.8 C) 98.5 F (36.9 C)  TempSrc: Oral Oral Oral Oral  SpO2: 97% 100% 96% 98%  Weight:      Height:        Intake/Output Summary (Last 24 hours) at 09/07/2019 1257 Last data filed at 09/07/2019 1029 Gross per 24 hour  Intake 660 ml  Output 1750 ml  Net -1090 ml   Filed Weights   09/03/19 1757 09/03/19 1807  Weight: 47.6 kg 47.6 kg    Examination:  General exam: Appears calm and comfortable Respiratory  system: Clear to auscultation. Respiratory effort normal. Cardiovascular system: S1 & S2 heard, RRR. No murmurs, rubs, gallops or clicks. Gastrointestinal system: Abdomen is nondistended, soft and nontender. No organomegaly or masses felt. Normal bowel sounds heard. Central nervous system: Alert and oriented. No focal neurological deficits. Extremities: No edema. No calf tenderness Skin: No cyanosis. No rashes Psychiatry: Judgement and insight appear normal. Mood & affect appropriate.      Data Reviewed: I have personally reviewed following labs and imaging studies  CBC: Recent Labs  Lab 09/03/19 1552 09/04/19 0223 09/05/19 1008  WBC 6.6 11.6* 11.2*  NEUTROABS 5.1  --   --   HGB 12.2 10.5* 10.3*  HCT 37.0 32.5* 31.4*  MCV 97.1 97.0 97.5  PLT 246 243 [id number]   Basic Metabolic Panel: Recent Labs  Lab 09/03/19 1552 09/04/19 0223 09/05/19 1008  NA 137 136 134*  K 3.6 3.2* 3.4*  CL 103 101 98  CO2 27 28 28   GLUCOSE 97 188* 119*  BUN 16 12 12   CREATININE 0.41* 0.42* 0.46  CALCIUM 9.3 8.5* 8.8*   GFR: Estimated Creatinine Clearance: 37.9 mL/min (by C-G formula based on SCr of 0.46 mg/dL). Liver Function Tests: No results for input(s): AST, ALT, ALKPHOS, BILITOT, PROT, ALBUMIN in the last 168 hours. No results for input(s): LIPASE, AMYLASE in the last 168 hours. No results for input(s): AMMONIA in the last 168 hours. Coagulation Profile: Recent Labs  Lab 09/03/19 1825  INR 1.0  Cardiac Enzymes: No results for input(s): CKTOTAL, CKMB, CKMBINDEX, TROPONINI in the last 168 hours. BNP (last 3 results) No results for input(s): PROBNP in the last 8760 hours. HbA1C: No results for input(s): HGBA1C in the last 72 hours. CBG: No results for input(s): GLUCAP in the last 168 hours. Lipid Profile: No results for input(s): CHOL, HDL, LDLCALC, TRIG, CHOLHDL, LDLDIRECT in the last 72 hours. Thyroid Function Tests: No results for input(s): TSH, T4TOTAL, FREET4, T3FREE, THYROIDAB  in the last 72 hours. Anemia Panel: No results for input(s): VITAMINB12, FOLATE, FERRITIN, TIBC, IRON, RETICCTPCT in the last 72 hours. Sepsis Labs: No results for input(s): PROCALCITON, LATICACIDVEN in the last 168 hours.  Recent Results (from the past 240 hour(s))  SARS Coronavirus 2 [facility] order, Performed in [facility] hospital lab) Nasopharyngeal Nasopharyngeal Swab     Status: None   Collection Time: 09/03/19  3:52 PM   Specimen: Nasopharyngeal Swab  Result Value Ref Range Status   SARS Coronavirus 2 NEGATIVE NEGATIVE Final    Comment: (NOTE) If result is NEGATIVE SARS-CoV-2 target nucleic acids are NOT DETECTED. The SARS-CoV-2 RNA is generally detectable in upper and lower  respiratory specimens during the acute phase of infection. The lowest  concentration of SARS-CoV-2 viral copies this assay can detect is 250  copies / mL. A negative result does not preclude SARS-CoV-2 infection  and should not be used as the sole basis for treatment or other  patient management decisions.  A negative result may occur with  improper specimen collection / handling, submission of specimen other  than nasopharyngeal swab, presence of viral mutation(s) within the  areas targeted by this assay, and inadequate number of viral copies  (<250 copies / mL). A negative result must be combined with clinical  observations, patient history, and epidemiological information. If result is POSITIVE SARS-CoV-2 target nucleic acids are DETECTED. The SARS-CoV-2 RNA is generally detectable in upper and lower  respiratory specimens dur ing the acute phase of infection.  Positive  results are indicative of active infection with SARS-CoV-2.  Clinical  correlation with patient history and other diagnostic information is  necessary to determine patient infection status.  Positive results do  not rule out bacterial infection or co-infection with other viruses. If result is PRESUMPTIVE POSTIVE SARS-CoV-2 nucleic  acids MAY BE PRESENT.   A presumptive positive result was obtained on the submitted specimen  and confirmed on repeat testing.  While 2019 novel coronavirus  (SARS-CoV-2) nucleic acids may be present in the submitted sample  additional confirmatory testing may be necessary for epidemiological  and / or clinical management purposes  to differentiate between  SARS-CoV-2 and other Sarbecovirus currently known to infect humans.  If clinically indicated additional testing with an alternate test  methodology [phone]) is advised. The SARS-CoV-2 RNA is generally  detectable in upper and lower respiratory sp ecimens during the acute  phase of infection. The expected result is Negative. Fact Sheet for Patients:  StrictlyIdeas.no Fact Sheet for Healthcare Providers: BankingDealers.co.za This test is not yet approved or cleared by the [place] FDA and has been authorized for detection and/or diagnosis of SARS-CoV-2 by FDA under an Emergency Use Authorization (EUA).  This EUA will remain in effect (meaning this test can be used) for the duration of the COVID-19 declaration under Section 564(b)(1) of the Act, 21 U.S.C. section 360bbb-3(b)(1), unless the authorization is terminated or revoked sooner. Performed at [facility], [place] [address], [place], [place] [address]  Radiology Studies: No results found.      Scheduled Meds: . amLODipine  5 mg Oral QPM  . aspirin EC  325 mg Oral Q breakfast  . docusate sodium  100 mg Oral BID  . feeding supplement (ENSURE ENLIVE)  237 mL Oral BID BM  . ferrous sulfate  325 mg Oral Q breakfast  . metoprolol succinate  25 mg Oral Daily  . multivitamin with minerals  1 tablet Oral Daily  . polyethylene glycol  17 g Oral BID   Continuous Infusions: . methocarbamol (ROBAXIN) IV       LOS: 4 days     Cordelia Poche, MD [facility] 09/07/2019, 12:57 PM  If  7PM-7AM, please contact night-coverage [email or web address]

## 2019-09-07 NOTE — Progress Notes (Signed)
Subjective: 4 Days Post-Op Procedure(s) (LRB): ANTERIOR APPROACH HEMI HIP ARTHROPLASTY BIPOLAR (Right) Patient reports pain as mild.  The patient is sitting up in chair eating lunch.  She reports that her right hip does not hurt.  She ambulated in the hall with physical therapy.  Objective: Vital signs in last 24 hours: Temp:  [98.3 F (36.8 C)-98.6 F (37 C)] 98.5 F (36.9 C) (09/08 0628) Pulse Rate:  [72-92] 72 (09/08 0628) Resp:  [14-18] 14 (09/08 0628) BP: (112-142)/(54-80) 129/66 (09/08 0628) SpO2:  [96 %-100 %] 98 % (09/08 0628)  Intake/Output from previous day: 09/07 0701 - 09/08 0700 In: 540 [P.O.:540] Out: 1600 [Urine:1600] Intake/Output this shift: Total I/O In: 120 [P.O.:120] Out: 450 [Urine:450]  Recent Labs    09/05/19 1008  HGB 10.3*   Recent Labs    09/05/19 1008  WBC 11.2*  RBC 3.22*  HCT 31.4*  PLT 229   Recent Labs    09/05/19 1008  NA 134*  K 3.4*  CL 98  CO2 28  BUN 12  CREATININE 0.46  GLUCOSE 119*  CALCIUM 8.8*   No results for input(s): LABPT, INR in the last 72 hours. Right hip exam: Neurovascular intact Sensation intact distally Intact pulses distally Dorsiflexion/Plantar flexion intact Incision: dressing C/D/I Compartment soft   Assessment/Plan: 4 Days Post-Op Procedure(s) (LRB): ANTERIOR APPROACH HEMI HIP ARTHROPLASTY BIPOLAR (Right) Plan: Weight-bear as tolerated on right without hip precautions. Aspirin 325 mg enteric-coated twice daily x1 month postop for DVT prophylaxis. Discharge to SNF when arrangements made. Follow-up with Dr. Berenice Primas in 2 weeks.      Erlene Senters 09/07/2019, 12:52 PM

## 2019-09-07 NOTE — TOC Progression Note (Signed)
Transition of Care [facility]) - Progression Note    Patient Details  Name: Alicia Boyd MRN: [id number] Date of Birth: 1930/01/12  Transition of Care [facility]) CM/SW [place], [place] Phone Number: 09/07/2019, 2:37 PM  Clinical Narrative:    Patient accepted at  [facility].  Patient will need a COVID-19 test prior to placement, CSW notified physician.    Expected Discharge Plan: Skilled Nursing Facility Barriers to Discharge: COVID-19 Test   Expected Discharge Plan and Services Expected Discharge Plan: [place] In-house Referral: Clinical Social Work   Post Acute Care Choice: [place] Living arrangements for the past 2 months: Single Family Home([facility] [facility])                                       Social Determinants of Health (SDOH) Interventions    Readmission Risk Interventions No flowsheet data found.

## 2019-09-07 NOTE — Progress Notes (Signed)
Physical Therapy Treatment Patient Details Name: Alicia Boyd MRN: [id number] DOB: 01-03-1930 Today's Date: 09/07/2019    History of Present Illness 85 yo female adm iwth R hip fx s/p ANTERIOR APPROACH HEMI HIP ARTHROPLASTY BIPOLAR (Right)  PMH: hypertension, osteoporosis and recent pelvic fracture about 2-3 months ago    PT Comments    Pt progressing well. Cooperative and motivated. Will benefit from SNF post acute    Follow Up Recommendations  SNF     Equipment Recommendations  None recommended by PT    Recommendations for Other Services       Precautions / Restrictions Precautions Precautions: Fall Restrictions Weight Bearing Restrictions: No Other Position/Activity Restrictions: WBAT    Mobility  Bed Mobility Overal bed mobility: Needs Assistance Bed Mobility: Supine to Sit     Supine to sit: Min assist     General bed mobility comments: cues for technique, assist with RLE   Transfers Overall transfer level: Needs assistance Equipment used: Rolling walker (2 wheeled) Transfers: Sit to/from [facility] Sit to Stand: Min guard;Min assist Stand pivot transfers: Min assist       General transfer comment: assist to rise and stabilize, cues for safety and hand placement  Ambulation/Gait Ambulation/Gait assistance: Min guard;Min assist Gait Distance (Feet): 75 Feet Assistive device: Rolling walker (2 wheeled) Gait Pattern/deviations: Step-to pattern;Decreased step length - right;Decreased step length - left;Decreased weight shift to right     General Gait Details: cues for RW position and sequence    Stairs             Wheelchair Mobility    Modified Rankin (Stroke Patients Only)       Balance                                            Cognition Arousal/Alertness: Awake/alert Behavior During Therapy: WFL for tasks assessed/performed Overall Cognitive Status: Within Functional Limits for tasks  assessed                                        Exercises General Exercises - Lower Extremity Ankle Circles/Pumps: AROM;10 reps;Both Heel Slides: AROM;AAROM;Both;10 reps Hip ABduction/ADduction: AROM;Right;10 reps    General Comments        Pertinent Vitals/Pain Pain Assessment: Faces Faces Pain Scale: Hurts a little bit Pain Location: right hip Pain Descriptors / Indicators: Grimacing;Sore Pain Intervention(s): Limited activity within patient's tolerance;Monitored during session    Home Living                      Prior Function            PT Goals (current goals can now be found in the care plan section) Acute Rehab PT Goals Patient Stated Goal: get back home with comfort keepers PT Goal Formulation: With patient Time For Goal Achievement: 09/18/19 Potential to Achieve Goals: Good Progress towards PT goals: Progressing toward goals    Frequency    Min 3X/week      PT Plan Current plan remains appropriate    Co-evaluation              AM-PAC PT "6 Clicks" Mobility   Outcome Measure  Help needed turning from your back to your side while in a flat bed without  using bedrails?: A Little Help needed moving from lying on your back to sitting on the side of a flat bed without using bedrails?: A Little Help needed moving to and from a bed to a chair (including a wheelchair)?: A Little Help needed standing up from a chair using your arms (e.g., wheelchair or bedside chair)?: A Little Help needed to walk in hospital room?: A Little Help needed climbing 3-5 steps with a railing? : A Lot 6 Click Score: 17    End of Session Equipment Utilized During Treatment: Gait belt Activity Tolerance: Patient tolerated treatment well Patient left: in chair;with call bell/phone within reach;with chair alarm set   PT Visit Diagnosis: Difficulty in walking, not elsewhere classified (R26.2);Muscle weakness (generalized) (M62.81);History of falling  (Z91.81)     Time: [id number] PT Time Calculation (min) (ACUTE ONLY): 18 min  Charges:  $Gait Training: 8-22 mins                     Kenyon Ana, PT  Pager: [phone] Acute Rehab Dept [facility]): [phone]   09/07/2019    [facility] 09/07/2019, 12:27 PM

## 2019-09-07 NOTE — Care Management Important Message (Signed)
Important Message  Patient Details  Name: Alicia Boyd MRN: [id number] Date of Birth: 05-Jan-1930   Medicare Important Message Given:  Yes. CMA printed out IM for the Case Management Nurse or CSW to give to patient.      Holdyn Poyser 09/07/2019, 10:35 AM

## 2019-09-08 DIAGNOSIS — E440 Moderate protein-calorie malnutrition: Secondary | ICD-10-CM | POA: Insufficient documentation

## 2019-09-08 LAB — CBC WITH DIFFERENTIAL/PLATELET
Abs Immature Granulocytes: 0.04 10*3/uL (ref 0.00–0.07)
Basophils Absolute: 0.1 10*3/uL (ref 0.0–0.1)
Basophils Relative: 1 %
Eosinophils Absolute: 0.1 10*3/uL (ref 0.0–0.5)
Eosinophils Relative: 2 %
HCT: 32.3 % — ABNORMAL LOW (ref 36.0–46.0)
Hemoglobin: 10.5 g/dL — ABNORMAL LOW (ref 12.0–15.0)
Immature Granulocytes: 1 %
Lymphocytes Relative: 17 %
Lymphs Abs: 1.1 10*3/uL (ref 0.7–4.0)
MCH: 32.4 pg (ref 26.0–34.0)
MCHC: 32.5 g/dL (ref 30.0–36.0)
MCV: 99.7 fL (ref 80.0–100.0)
Monocytes Absolute: 0.4 10*3/uL (ref 0.1–1.0)
Monocytes Relative: 7 %
Neutro Abs: 4.7 10*3/uL (ref 1.7–7.7)
Neutrophils Relative %: 72 %
Platelets: 287 10*3/uL (ref 150–400)
RBC: 3.24 MIL/uL — ABNORMAL LOW (ref 3.87–5.11)
RDW: 12.4 % (ref 11.5–15.5)
WBC: 6.4 10*3/uL (ref 4.0–10.5)
nRBC: 0 % (ref 0.0–0.2)

## 2019-09-08 LAB — COMPREHENSIVE METABOLIC PANEL
ALT: 81 U/L — ABNORMAL HIGH (ref 0–44)
AST: 36 U/L (ref 15–41)
Albumin: 2.7 g/dL — ABNORMAL LOW (ref 3.5–5.0)
Alkaline Phosphatase: 247 U/L — ABNORMAL HIGH (ref 38–126)
Anion gap: 8 (ref 5–15)
BUN: 17 mg/dL (ref 8–23)
CO2: 28 mmol/L (ref 22–32)
Calcium: 8.8 mg/dL — ABNORMAL LOW (ref 8.9–10.3)
Chloride: 101 mmol/L (ref 98–111)
Creatinine, Ser: 0.37 mg/dL — ABNORMAL LOW (ref 0.44–1.00)
GFR calc Af Amer: >60 mL/min (ref >60)
GFR calc non Af Amer: >60 mL/min (ref >60)
Glucose, Bld: 132 mg/dL — ABNORMAL HIGH (ref 70–99)
Potassium: 3.7 mmol/L (ref 3.5–5.1)
Sodium: 137 mmol/L (ref 135–145)
Total Bilirubin: 0.9 mg/dL (ref 0.3–1.2)
Total Protein: 5.3 g/dL — ABNORMAL LOW (ref 6.5–8.1)

## 2019-09-08 LAB — PHOSPHORUS: Phosphorus: 4.1 mg/dL (ref 2.5–4.6)

## 2019-09-08 LAB — MAGNESIUM: Magnesium: 2.2 mg/dL (ref 1.7–2.4)

## 2019-09-08 LAB — SARS CORONAVIRUS 2 (TAT 6-24 HRS): SARS Coronavirus 2: NEGATIVE

## 2019-09-08 MED ORDER — FERROUS SULFATE 325 (65 FE) MG PO TABS: 325.0000 mg | ORAL_TABLET | Freq: Every day | ORAL | 3 refills | Status: DC

## 2019-09-08 MED ORDER — ENSURE ENLIVE PO LIQD: 237.0000 mL | Freq: Two times a day (BID) | ORAL | 12 refills | Status: DC

## 2019-09-08 MED ORDER — ONDANSETRON HCL 4 MG PO TABS: 4.0000 mg | ORAL_TABLET | Freq: Four times a day (QID) | ORAL | 0 refills | Status: DC | PRN

## 2019-09-08 MED ORDER — POLYETHYLENE GLYCOL 3350 17 G PO PACK: 17.0000 g | PACK | Freq: Two times a day (BID) | ORAL | 0 refills | Status: DC

## 2019-09-08 MED ORDER — METHOCARBAMOL 500 MG PO TABS: 500.0000 mg | ORAL_TABLET | Freq: Four times a day (QID) | ORAL | Status: DC | PRN

## 2019-09-08 MED ORDER — DOCUSATE SODIUM 100 MG PO CAPS: 100.0000 mg | ORAL_CAPSULE | Freq: Two times a day (BID) | ORAL | 0 refills | Status: DC

## 2019-09-08 NOTE — Progress Notes (Signed)
Initial Nutrition Assessment  DOCUMENTATION CODES:   Underweight, Non-severe (moderate) malnutrition in context of chronic illness  INTERVENTION:  - continue Ensure Enlive BID, each supplement provides 350 kcal and 20 grams of protein. - continue to encourage PO intakes.    NUTRITION DIAGNOSIS:   Moderate Malnutrition related to chronic illness as evidenced by moderate fat depletion, moderate muscle depletion, mild fat depletion.  GOAL:   Patient will meet greater than or equal to 90% of their needs  MONITOR:   PO intake, Supplement acceptance, Labs, Weight trends, Skin  REASON FOR ASSESSMENT:   Consult Assessment of nutrition requirement/status  ASSESSMENT:   85 y.o. female with history of HTN, osteoporosis and recent pelvic fracture about 2-3 months ago presented to the ED with right hip pain x1 week on 9/4. She presented to the [facility] on 9/2 and had an x-ray which showed acute R femoral neck fx and slight displacement and also old R pubic fxs; referred to Orthopedic Surgery. She denied fall(s) or trauma PTA and ambulates independently at baseline. Her husband passed away 2 months ago and her sister passed away 2 weeks ago. She lives at home alone.  Per flow sheet, patient consumed 75% of breakfast and 50% of lunch on 9/5 (total of 914 kcal, 32 grams protein); 75% of breakfast, 100% of lunch, and 50% of dinner on 9/5 (total of 1637 kcal, 75 grams protein); 100% of lunch on 9/7 (668 kcal, 29 grams protein); 50% of breakfast and lunch on 9/8 (total of 758 kcal, 29 grams protein).   Ensure Enlive was ordered BID on 9/7 and patient has accepted all 4 bottles offered to her. Per chart review, current weight is 105 lb and weight on 5/6 was 130 lb, which is an outlier from other weights over the past 1.5 years. Weight on 01/05/19 was 114 lb.  Discharge order and discharge summary for discharge to SNF entered earlier this AM.   Labs reviewed; creatinine: 0.37 mg/dl, Alk  Phos elevated. Medications reviewed; 100 mg colace BID, 325 mg ferrous sulfate/day, daily multivitamin with minerals, 1 packet miralax BID.       NUTRITION - FOCUSED PHYSICAL EXAM:    Most Recent Value  Orbital Region  No depletion  Upper Arm Region  Moderate depletion  Thoracic and Lumbar Region  Unable to assess  Buccal Region  Mild depletion  Temple Region  Mild depletion  Clavicle Bone Region  Moderate depletion  Clavicle and Acromion Bone Region  Moderate depletion  Scapular Bone Region  Unable to assess  Dorsal Hand  Mild depletion  Patellar Region  Mild depletion  Anterior Thigh Region  Unable to assess  Posterior Calf Region  Moderate depletion  Edema (RD Assessment)  None  Hair  Reviewed  Eyes  Reviewed  Mouth  Reviewed  Skin  Reviewed  Nails  Reviewed       Diet Order:   Diet Order            Diet - low sodium heart healthy        Diet regular Room service appropriate? Yes; Fluid consistency: Thin  Diet effective now              EDUCATION NEEDS:   No education needs have been identified at this time  Skin:  Skin Assessment: Skin Integrity Issues: Skin Integrity Issues:: Incisions Incisions: R thigh (9/4)  Last BM:  9/8  Height:   Ht Readings from Last 1 Encounters:  09/03/19 5' 4"  (1.626  m)    Weight:   Wt Readings from Last 1 Encounters:  09/03/19 47.6 kg    Ideal Body Weight:  54.5 kg  BMI:  Body mass index is 18.02 kg/m.  Estimated Nutritional Needs:   Kcal:  1600-1800 kcal  Protein:  80-90 grams  Fluid:  >/= 1.8 L/day     Jarome Matin, MS, RD, LDN, [facility] Inpatient Clinical Dietitian Pager # [phone] After hours/weekend pager # [phone]

## 2019-09-08 NOTE — Discharge Summary (Addendum)
Physician Discharge Summary  Alicia Boyd [id number] DOB: [birth year] DOA: 09/03/2019  PCP: [name], NP  Admit date: 09/03/2019 Discharge date: 09/09/2019  Admitted From: Home Disposition: SNF   Recommendations for Outpatient Follow-up:  1. Follow up with PCP in 1-2 weeks 2. Follow up with Orthopedic Surgery Dr. Berenice Primas within 1-2 weeks of D/C  3. C/w ASA 325 mg BID x 1 month per Ortho for DVT Prophylaxis  4. Please obtain CMP/CBC, Phos, Mag in one week 5. Please follow up on the following pending results:  Home Health: No Equipment/Devices: None recommended by PT; Weight-bear as tolerated on Right without Hip Precautions   Discharge Condition: Stable CODE STATUS: DO NOT RESUSCITATE  Diet recommendation: Heart Healthy Diet  Brief/Interim Summary: Alicia Boyd is a 85 y.o. femalewith history ofhypertension, osteoporosisandrecent pelvic fracture about 2-57months ago. Patient presented secondary to right hip pain and found to have a hip fracture.  She underwent operative management fracture with a left hemiarthroplasty with bipolar from anterior approach.  She is postoperative day 5 and doing well and has been placed on VTE prophylaxis with aspirin 3 2 5  mg p.o. twice daily for a month by orthopedic surgery.  She has a follow-up appointment with Dr. Berenice Primas within 2 weeks and will weight-bear as tolerated on the right without hip precautions.  She is improved and stable for discharge and will follow up with PCP as well in outpatient setting.  ADDENDUM 09/09/2019: Patient was discharged yesterday and stable for discharge yesterday but unfortunately did not go due to an issue with the Tramadol Script that was to be provided per [facility]. Patient states she no longer takes Tramadol so it will be discontinued currently and we will continue to Defer Pain Medications to Orthopedic Service. She is stable for Discharge and had no issues overnight and will be discharged  to SNF today as bed is available.   Discharge Diagnoses:  Principal Problem:   Displaced fracture of right femoral neck (HCC) Active Problems:   Essential hypertension   Malnutrition of moderate degree  Right femoral neck fracture status post operative management postoperative day 6 -Displaced. Patient is s/p hemi hip arthroplasty on 09/03/2019. CIR evaluated and patient not recommended. -Orthopedic surgery recommendations: WBAT, aspirin 325 mg EC twice daily x1 month, up with therapy; 2 week f/u with Dr. Berenice Primas -SW for likely SNF placement and she has a bed placement today -Further plans Per orthopedic surgery -We will need to follow-up closely with orthopedic surgery as well as primary care physician within 1 month  Essential Hypertension -Continue metoprolol Succinate 25 mg po Daily and amlodipine 5 mg po daily -Blood pressure this morning was 150/67 -Continue monitor and trend vital signs in outpatient setting  Abdominal distension/Constipation  -Appears improved. -Schedule MiraLAX -Bowel regimen recommended for discharge and will continue docusate and MiraLAX at discharge  Normocytic Anemia -Likely post-operative -CBC showed Hgb/Hct went from 12.2/37.0 -> 10.3/31.4 -> 10.5/32.3 -Continue to Monitor for S/Sx of Bleeding; Currently no signs and symptoms of bleeding -Continue with ferrous sulfate 325 mg p.o. daily with breakfast at discharge -Repeat CBC at SNF in 1 week  Leukocytosis -Improved.  Patient's WBC went from 11.6 is now 6.4 this morning -Likely reactive in the setting of pain from surgery -Continue to monitor for signs and symptoms of infection; currently no signs of infection currently noted -Repeat CBC in the outpatient setting  Hypokalemia -Improved -Patient's potassium is now 3.7 -Continue monitor and trend in the outpatient setting -Repeat CMP  in within 1 week  Hyponatremia -Improved -Patient's sodium is 134 is now improved to 137 -Continue to  monitor and trend -Repeat CMP in the outpatient setting  Hyperglycemia -CBG's ranging 97 288 -Last HbA1c was 5.5. on 07/12/2019  -Repeat HbA1c as an outpatient   Underweight/Malnutrition of Moderate Degree -Continue with feeding supplements with Ensure Enlive 237 mL P.o. twice daily -Nutritionist consulted for further evaluation and recommending continue usual P.o. twice daily and encouraging p.o. intake -Follow-up with nutritionist in outpatient setting and continue with insulin at twice daily at discharge  Abnormal LFT -Likely reactive; currently ALT is 81 -Continue to monitor trend in the outpatient setting\ -Repeat CMP within 1 week  Discharge Instructions  Discharge Instructions    Call MD for:  difficulty breathing, headache or visual disturbances   Complete by: As directed    Call MD for:  extreme fatigue   Complete by: As directed    Call MD for:  hives   Complete by: As directed    Call MD for:  persistant dizziness or light-headedness   Complete by: As directed    Call MD for:  persistant nausea and vomiting   Complete by: As directed    Call MD for:  redness, tenderness, or signs of infection (pain, swelling, redness, odor or green/yellow discharge around incision site)   Complete by: As directed    Call MD for:  severe uncontrolled pain   Complete by: As directed    Call MD for:  temperature >100.4   Complete by: As directed    Diet - low sodium heart healthy   Complete by: As directed    Discharge instructions   Complete by: As directed    You were cared for by a hospitalist during your hospital stay. If you have any questions about your discharge medications or the care you received while you were in the hospital after you are discharged, you can call the unit and ask to speak with the hospitalist on call if the hospitalist that took care of you is not available. Once you are discharged, your primary care physician will handle any further medical issues. Please  note that NO REFILLS for any discharge medications will be authorized once you are discharged, as it is imperative that you return to your primary care physician (or establish a relationship with a primary care physician if you do not have one) for your aftercare needs so that they can reassess your need for medications and monitor your lab values.  Follow up with PCP and Orthopedic Surgery within 1 week. Take all medications as prescribed. If symptoms change or worsen please return to the ED for evaluation   Increase activity slowly   Complete by: As directed    Other Restrictions   Complete by: As directed    Per Orthopedic Surgery   Weight bearing as tolerated   Complete by: As directed    No hip precautions   Laterality: right   Extremity: Lower     Allergies as of 09/09/2019      Reactions   Amitid [amitriptyline Hcl]    Patient experienced a dizzy, spinning feeling    Metronidazole    REACTION: throat swells   Penicillins    REACTION: rash & fever   Alendronate Sodium    REACTION: unspecified   Clarithromycin    GI intolerance   Flonase [fluticasone Propionate]    Slight Headache    Promethazine Hcl    REACTION: unspecified   Robitussin (  alcohol Free) [guaifenesin] Other (See Comments)   unknown hyperactive   Spironolactone    09/21/13 weakness in legs   Singulair [montelukast Sodium]    "spacey"; ineffective      Medication List    STOP taking these medications   gabapentin 100 MG capsule Commonly known as: NEURONTIN   ipratropium 0.03 % nasal spray Commonly known as: ATROVENT   mirtazapine 7.5 MG tablet Commonly known as: REMERON   traMADol 50 MG tablet Commonly known as: ULTRAM     TAKE these medications   acetaminophen 500 MG tablet Commonly known as: TYLENOL Take 1,000 mg by mouth every 6 (six) hours as needed for moderate pain.   amLODipine 5 MG tablet Commonly known as: NORVASC TAKE 1 TABLET BY MOUTH EVERY DAY What changed: when to take  this   aspirin EC 325 MG tablet Take 1 tablet (325 mg total) by mouth daily after breakfast. Take x 1 month post op to decrease risk of blood clots.   docusate sodium 100 MG capsule Commonly known as: COLACE Take 1 capsule (100 mg total) by mouth 2 (two) times daily.   feeding supplement (ENSURE ENLIVE) Liqd Take 237 mLs by mouth 2 (two) times daily between meals.   ferrous sulfate 325 (65 FE) MG tablet Take 1 tablet (325 mg total) by mouth daily with breakfast.   fexofenadine 180 MG tablet Commonly known as: ALLEGRA Take 180 mg by mouth daily as needed for allergies.   methocarbamol 500 MG tablet Commonly known as: ROBAXIN Take 1 tablet (500 mg total) by mouth every 6 (six) hours as needed for muscle spasms.   metoprolol succinate 25 MG 24 hr tablet Commonly known as: TOPROL-XL TAKE 1 TABLET BY MOUTH EVERY DAY   multivitamin capsule Take 1 capsule by mouth daily.   ondansetron 4 MG tablet Commonly known as: ZOFRAN Take 1 tablet (4 mg total) by mouth every 6 (six) hours as needed for nausea.   polyethylene glycol 17 g packet Commonly known as: MIRALAX / GLYCOLAX Take 17 g by mouth 2 (two) times daily.   SYSTANE OP Apply 1 drop to eye daily as needed (dry eyes).   Vitamin D3 25 MCG (1000 UT) Caps Take 1 capsule by mouth daily.            Discharge Care Instructions  (From admission, onward)         Start     Ordered   09/03/19 0000  Weight bearing as tolerated    Comments: No hip precautions  Question Answer Comment  Laterality right   Extremity Lower      09/03/19 2107          Contact information for follow-up providers    [name], MD. Schedule an appointment as soon as possible for a visit in 2 weeks.   Specialty: Orthopedic Surgery Contact information: [place] [address] [phone]        [name], NP. Call.   Specialty: Family Medicine Why: Follow up within 1-2 weeks of Discharge from SNF Contact  information: [address] [place] [address] [phone]            Contact information for after-discharge care    Destination    [facility] .   Service: Skilled Nursing Contact information: [address] [place] [phone]                 Allergies  Allergen Reactions  .  Amitid [Amitriptyline Hcl]     Patient experienced a dizzy, spinning feeling   . Metronidazole     REACTION: throat swells  . Penicillins     REACTION: rash & fever  . Alendronate Sodium     REACTION: unspecified  . Clarithromycin     GI intolerance  . Flonase [Fluticasone Propionate]     Slight Headache   . Promethazine Hcl     REACTION: unspecified  . Robitussin (Alcohol Free) [Guaifenesin] Other (See Comments)    unknown hyperactive  . Spironolactone     09/21/13 weakness in legs  . Singulair [Montelukast Sodium]     "spacey"; ineffective   Consultations:  Orthopedic Surgery Dr. Berenice Primas  Procedures/Studies: Dg C-arm 1-60 Min-no Report  Result Date: 09/03/2019 Fluoroscopy was utilized by the requesting physician.  No radiographic interpretation.   Dg Hip Operative Unilat W Or W/o Pelvis Right  Result Date: 09/03/2019 CLINICAL DATA:  Hip replacement EXAM: OPERATIVE RIGHT HIP (WITH PELVIS IF PERFORMED) 2 VIEWS TECHNIQUE: Fluoroscopic spot image(s) were submitted for interpretation post-operatively. COMPARISON:  09/01/2019 FINDINGS: The patient has undergone total hip arthroplasty on the right. The alignment appears near anatomic. There are expected postsurgical changes. IMPRESSION: Status post right total hip arthroplasty without complicating features. Electronically Signed   By: Constance Holster M.D.   On: 09/03/2019 20:49   Dg Femur, Min 2 Views Right  Result Date: 09/02/2019 CLINICAL DATA:  Right femoral pain. EXAM: RIGHT FEMUR 2 VIEWS COMPARISON:  Right femur and hip series 05/05/2019. FINDINGS: An acute fracture of the  right femoral neck is present with slight angulation and displacement. Old right inferior pubic ramus noted. Superior right pubic ramus fractures noted with possible callus formation. This may be subacute. No evidence of hip dislocation. Peripheral vascular calcification. IMPRESSION: 1. An acute fracture of the right femoral neck is present with slight angulation and displacement. 2. Old right inferior pubic ramus fracture noted. Right superior pubic ramus fracture with possible callus formation noted. This may be subacute. 3.  Peripheral vascular disease. Electronically Signed   By: Marcello Moores  Register   On: 09/02/2019 06:53    Subjective: Seen and examined at bedside and she had no complaints.  States that she is feeling better.  Denies chest pain, lightheadedness or dizziness.  No nausea or vomiting.  No other concerns or complaints at this time and she understands she will need to follow-up with primary care physician and orthopedic surgery within 1 to 2 weeks.  She is agreeable to the plan of care and she will follow-up for further therapy at SNF.  Discharge Exam: Vitals:   09/08/19 2119 09/09/19 0610  BP: (!) 144/58 (!) 146/69  Pulse: 80 94  Resp: 16 16  Temp: 98.5 F (36.9 C) 98.5 F (36.9 C)  SpO2: 97% 98%   Vitals:   09/08/19 1347 09/08/19 1352 09/08/19 2119 09/09/19 0610  BP: (!) 136/53 (!) 136/53 (!) 144/58 (!) 146/69  Pulse: 85 86 80 94  Resp:  16 16 16   Temp: 98 F (36.7 C) 98.1 F (36.7 C) 98.5 F (36.9 C) 98.5 F (36.9 C)  TempSrc: Oral Oral Oral Oral  SpO2: 99% 96% 97% 98%  Weight:      Height:       General: Pt is alert, awake, not in acute distress Cardiovascular: RRR, S1/S2 +, no rubs, no gallops Respiratory: Diminished bilaterally, no wheezing, no rhonchi; unlabored breathing Abdominal: Soft, NT, ND, bowel sounds + Extremities: no edema, no cyanosis  The results  of significant diagnostics from this hospitalization (including imaging, microbiology, ancillary and  laboratory) are listed below for reference.    Microbiology: Recent Results (from the past 240 hour(s))  SARS Coronavirus 2 [facility] order, Performed in [facility] hospital lab) Nasopharyngeal Nasopharyngeal Swab     Status: None   Collection Time: 09/03/19  3:52 PM   Specimen: Nasopharyngeal Swab  Result Value Ref Range Status   SARS Coronavirus 2 NEGATIVE NEGATIVE Final    Comment: (NOTE) If result is NEGATIVE SARS-CoV-2 target nucleic acids are NOT DETECTED. The SARS-CoV-2 RNA is generally detectable in upper and lower  respiratory specimens during the acute phase of infection. The lowest  concentration of SARS-CoV-2 viral copies this assay can detect is 250  copies / mL. A negative result does not preclude SARS-CoV-2 infection  and should not be used as the sole basis for treatment or other  patient management decisions.  A negative result may occur with  improper specimen collection / handling, submission of specimen other  than nasopharyngeal swab, presence of viral mutation(s) within the  areas targeted by this assay, and inadequate number of viral copies  (<250 copies / mL). A negative result must be combined with clinical  observations, patient history, and epidemiological information. If result is POSITIVE SARS-CoV-2 target nucleic acids are DETECTED. The SARS-CoV-2 RNA is generally detectable in upper and lower  respiratory specimens dur ing the acute phase of infection.  Positive  results are indicative of active infection with SARS-CoV-2.  Clinical  correlation with patient history and other diagnostic information is  necessary to determine patient infection status.  Positive results do  not rule out bacterial infection or co-infection with other viruses. If result is PRESUMPTIVE POSTIVE SARS-CoV-2 nucleic acids MAY BE PRESENT.   A presumptive positive result was obtained on the submitted specimen  and confirmed on repeat testing.  While 2019 novel coronavirus   (SARS-CoV-2) nucleic acids may be present in the submitted sample  additional confirmatory testing may be necessary for epidemiological  and / or clinical management purposes  to differentiate between  SARS-CoV-2 and other Sarbecovirus currently known to infect humans.  If clinically indicated additional testing with an alternate test  methodology [phone]) is advised. The SARS-CoV-2 RNA is generally  detectable in upper and lower respiratory sp ecimens during the acute  phase of infection. The expected result is Negative. Fact Sheet for Patients:  StrictlyIdeas.no Fact Sheet for Healthcare Providers: BankingDealers.co.za This test is not yet approved or cleared by the [place] FDA and has been authorized for detection and/or diagnosis of SARS-CoV-2 by FDA under an Emergency Use Authorization (EUA).  This EUA will remain in effect (meaning this test can be used) for the duration of the COVID-19 declaration under Section 564(b)(1) of the Act, 21 U.S.C. section 360bbb-3(b)(1), unless the authorization is terminated or revoked sooner. Performed at [facility], [place] [address], [place], [place] [address]   SARS CORONAVIRUS 2 (TAT 6-24 HRS) Nasopharyngeal Nasopharyngeal Swab     Status: None   Collection Time: 09/07/19  5:01 PM   Specimen: Nasopharyngeal Swab  Result Value Ref Range Status   SARS Coronavirus 2 NEGATIVE NEGATIVE Final    Comment: (NOTE) SARS-CoV-2 target nucleic acids are NOT DETECTED. The SARS-CoV-2 RNA is generally detectable in upper and lower respiratory specimens during the acute phase of infection. Negative results do not preclude SARS-CoV-2 infection, do not rule out co-infections with other pathogens, and should not be used as the sole basis for  treatment or other patient management decisions. Negative results must be combined with clinical observations, patient history, and epidemiological  information. The expected result is Negative. Fact Sheet for Patients: SugarRoll.be Fact Sheet for Healthcare Providers: https://www.woods-mathews.com/ This test is not yet approved or cleared by the [place] FDA and  has been authorized for detection and/or diagnosis of SARS-CoV-2 by FDA under an Emergency Use Authorization (EUA). This EUA will remain  in effect (meaning this test can be used) for the duration of the COVID-19 declaration under Section 56 4(b)(1) of the Act, 21 U.S.C. section 360bbb-3(b)(1), unless the authorization is terminated or revoked sooner. Performed at [facility], [place] [address] [place], [place] [address]     Labs: BNP (last 3 results) No results for input(s): BNP in the last 8760 hours. Basic Metabolic Panel: Recent Labs  Lab 09/03/19 1552 09/04/19 0223 09/05/19 1008 09/08/19 0843  NA 137 136 134* 137  K 3.6 3.2* 3.4* 3.7  CL 103 101 98 101  CO2 27 28 28 28   GLUCOSE 97 188* 119* 132*  BUN 16 12 12 17   CREATININE 0.41* 0.42* 0.46 0.37*  CALCIUM 9.3 8.5* 8.8* 8.8*  MG  --   --   --  2.2  PHOS  --   --   --  4.1   Liver Function Tests: Recent Labs  Lab 09/08/19 0843  AST 36  ALT 81*  ALKPHOS 247*  BILITOT 0.9  PROT 5.3*  ALBUMIN 2.7*   No results for input(s): LIPASE, AMYLASE in the last 168 hours. No results for input(s): AMMONIA in the last 168 hours. CBC: Recent Labs  Lab 09/03/19 1552 09/04/19 0223 09/05/19 1008 09/08/19 0843  WBC 6.6 11.6* 11.2* 6.4  NEUTROABS 5.1  --   --  4.7  HGB 12.2 10.5* 10.3* 10.5*  HCT 37.0 32.5* 31.4* 32.3*  MCV 97.1 97.0 97.5 99.7  PLT 246 243 229 287   Cardiac Enzymes: No results for input(s): CKTOTAL, CKMB, CKMBINDEX, TROPONINI in the last 168 hours. BNP: Invalid input(s): POCBNP CBG: No results for input(s): GLUCAP in the last 168 hours. D-Dimer No results for input(s): DDIMER in the last 72 hours. Hgb A1c No results for input(s):  HGBA1C in the last 72 hours. Lipid Profile No results for input(s): CHOL, HDL, LDLCALC, TRIG, CHOLHDL, LDLDIRECT in the last 72 hours. Thyroid function studies No results for input(s): TSH, T4TOTAL, T3FREE, THYROIDAB in the last 72 hours.  Invalid input(s): FREET3 Anemia work up No results for input(s): VITAMINB12, FOLATE, FERRITIN, TIBC, IRON, RETICCTPCT in the last 72 hours. Urinalysis    Component Value Date/Time   COLORURINE lt. yellow 01/25/2010 1147   APPEARANCEUR Cloudy 01/25/2010 1147   LABSPEC <1.005 01/25/2010 1147   PHURINE 8.0 01/25/2010 1147   HGBUR negative 01/25/2010 1147   BILIRUBINUR neg 03/19/2018 1433   PROTEINUR neg 03/19/2018 1433   UROBILINOGEN 0.2 03/19/2018 1433   UROBILINOGEN 0.2 01/25/2010 1147   NITRITE neg 03/19/2018 1433   NITRITE negative 01/25/2010 1147   LEUKOCYTESUR Negative 03/19/2018 1433   Sepsis Labs Invalid input(s): PROCALCITONIN,  WBC,  LACTICIDVEN Microbiology Recent Results (from the past 240 hour(s))  SARS Coronavirus 2 [facility] order, Performed in [facility] hospital lab) Nasopharyngeal Nasopharyngeal Swab     Status: None   Collection Time: 09/03/19  3:52 PM   Specimen: Nasopharyngeal Swab  Result Value Ref Range Status   SARS Coronavirus 2 NEGATIVE NEGATIVE Final    Comment: (NOTE) If result is NEGATIVE SARS-CoV-2 target  nucleic acids are NOT DETECTED. The SARS-CoV-2 RNA is generally detectable in upper and lower  respiratory specimens during the acute phase of infection. The lowest  concentration of SARS-CoV-2 viral copies this assay can detect is 250  copies / mL. A negative result does not preclude SARS-CoV-2 infection  and should not be used as the sole basis for treatment or other  patient management decisions.  A negative result may occur with  improper specimen collection / handling, submission of specimen other  than nasopharyngeal swab, presence of viral mutation(s) within the  areas targeted by this assay, and  inadequate number of viral copies  (<250 copies / mL). A negative result must be combined with clinical  observations, patient history, and epidemiological information. If result is POSITIVE SARS-CoV-2 target nucleic acids are DETECTED. The SARS-CoV-2 RNA is generally detectable in upper and lower  respiratory specimens dur ing the acute phase of infection.  Positive  results are indicative of active infection with SARS-CoV-2.  Clinical  correlation with patient history and other diagnostic information is  necessary to determine patient infection status.  Positive results do  not rule out bacterial infection or co-infection with other viruses. If result is PRESUMPTIVE POSTIVE SARS-CoV-2 nucleic acids MAY BE PRESENT.   A presumptive positive result was obtained on the submitted specimen  and confirmed on repeat testing.  While 2019 novel coronavirus  (SARS-CoV-2) nucleic acids may be present in the submitted sample  additional confirmatory testing may be necessary for epidemiological  and / or clinical management purposes  to differentiate between  SARS-CoV-2 and other Sarbecovirus currently known to infect humans.  If clinically indicated additional testing with an alternate test  methodology [phone]) is advised. The SARS-CoV-2 RNA is generally  detectable in upper and lower respiratory sp ecimens during the acute  phase of infection. The expected result is Negative. Fact Sheet for Patients:  StrictlyIdeas.no Fact Sheet for Healthcare Providers: BankingDealers.co.za This test is not yet approved or cleared by the [place] FDA and has been authorized for detection and/or diagnosis of SARS-CoV-2 by FDA under an Emergency Use Authorization (EUA).  This EUA will remain in effect (meaning this test can be used) for the duration of the COVID-19 declaration under Section 564(b)(1) of the Act, 21 U.S.C. section 360bbb-3(b)(1), unless the  authorization is terminated or revoked sooner. Performed at [facility], [place] [address], [place], [place] [address]   SARS CORONAVIRUS 2 (TAT 6-24 HRS) Nasopharyngeal Nasopharyngeal Swab     Status: None   Collection Time: 09/07/19  5:01 PM   Specimen: Nasopharyngeal Swab  Result Value Ref Range Status   SARS Coronavirus 2 NEGATIVE NEGATIVE Final    Comment: (NOTE) SARS-CoV-2 target nucleic acids are NOT DETECTED. The SARS-CoV-2 RNA is generally detectable in upper and lower respiratory specimens during the acute phase of infection. Negative results do not preclude SARS-CoV-2 infection, do not rule out co-infections with other pathogens, and should not be used as the sole basis for treatment or other patient management decisions. Negative results must be combined with clinical observations, patient history, and epidemiological information. The expected result is Negative. Fact Sheet for Patients: SugarRoll.be Fact Sheet for Healthcare Providers: https://www.woods-mathews.com/ This test is not yet approved or cleared by the [place] FDA and  has been authorized for detection and/or diagnosis of SARS-CoV-2 by FDA under an Emergency Use Authorization (EUA). This EUA will remain  in effect (meaning this test can be used) for the duration of the COVID-19 declaration  under Section 56 4(b)(1) of the Act, 21 U.S.C. section 360bbb-3(b)(1), unless the authorization is terminated or revoked sooner. Performed at [facility], [place] [address] [place], [place] [address]    Time coordinating discharge: 35 minutes  SIGNED:  Kerney Elbe, DO [facility] 09/09/2019, 9:49 AM Pager is on [place]  If 7PM-7AM, please contact night-coverage [email or web address] Password [email or web address]

## 2019-09-08 NOTE — Plan of Care (Signed)

## 2019-09-08 NOTE — TOC Transition Note (Addendum)
Transition of Care [facility]) - CM/SW Discharge Note   Patient Details  Name: Alicia Boyd MRN: [id number] Date of Birth: 03-17-1930  Transition of Care [facility]) CM/SW Contact:  Lia Hopping, [place] Phone Number: 09/08/2019, 12:56 PM   Clinical Narrative:    [facility] ready to accept. Nurse call report to: [phone] [place] to transport.    Final next level of care: Skilled Nursing Facility  Barriers to Discharge: No Barriers Identified   Patient Goals and CMS Choice Patient states their goals for this hospitalization and ongoing recovery are:: "I would like to go back home" CMS Medicare.gov Compare Post Acute Care list provided to:: Patient Choice offered to / list presented to : Patient  Discharge Placement   Existing PASRR number confirmed : 09/07/19          Patient chooses bed at: [facility] Patient to be transferred to facility by: [place] Name of family member notified: Juan Quam and Monia Pouch, [place] Patient and family notified of of transfer: 09/08/19  Discharge Plan and Services In-house Referral: Clinical Social Work   Post Acute Care Choice: [place]                              Social Determinants of Health (SDOH) Interventions     Readmission Risk Interventions No flowsheet data found.

## 2019-09-08 NOTE — Progress Notes (Signed)
Occupational Therapy Treatment Patient Details Name: Alicia Boyd MRN: [id number] DOB: January 10, 1930 Today's Date: 09/08/2019    History of present illness 85 yo female adm iwth R hip fx s/p ANTERIOR APPROACH HEMI HIP ARTHROPLASTY BIPOLAR (Right)  PMH: hypertension, osteoporosis and recent pelvic fracture about 2-3 months ago   OT comments  Pt tolerated well. Able to use gait belt to self assist her RLE when getting up.  Performed adl and toileting  Follow Up Recommendations  SNF    Equipment Recommendations  None recommended by OT    Recommendations for Other Services      Precautions / Restrictions Precautions Precautions: Fall Restrictions Other Position/Activity Restrictions: WBAT       Mobility Bed Mobility         Supine to sit: Min guard     General bed mobility comments: used gait belt to self assist RLE off EOB; moved both together to minimize pain  Transfers   Equipment used: Rolling walker (2 wheeled)   Sit to Stand: Min assist Stand pivot transfers: Min assist       General transfer comment: Transitions are quick and not smooth; steadying assistance    Balance             Standing balance-Leahy Scale: Poor Standing balance comment: reliant on UEs and external assist for dynamic activity                           ADL either performed or assessed with clinical judgement   ADL       Grooming: Set up;Sitting   Upper Body Bathing: Set up;Sitting   Lower Body Bathing: Minimal assistance;Moderate assistance;Sit to/from stand   Upper Body Dressing : Set up           [place] and Hygiene: Minimal assistance;Sit to/from stand(and SPT to [facility])         General ADL Comments: performed ADL, used BSC and got up to chair     Vision       Perception     Praxis      Cognition Arousal/Alertness: Awake/alert Behavior During Therapy: WFL for tasks assessed/performed Overall Cognitive Status: Within  Functional Limits for tasks assessed                                          Exercises     Shoulder Instructions       General Comments      Pertinent Vitals/ Pain       Pain Assessment: Faces Faces Pain Scale: Hurts little more Pain Location: right hip Pain Descriptors / Indicators: Grimacing;Sore Pain Intervention(s): Limited activity within patient's tolerance;Monitored during session;Premedicated before session;Repositioned  Home Living                                          Prior Functioning/Environment              Frequency  Min 2X/week        Progress Toward Goals  OT Goals(current goals can now be found in the care plan section)  Progress towards OT goals: Progressing toward goals     Plan      Co-evaluation  AM-PAC OT "6 Clicks" Daily Activity     Outcome Measure   Help from another person eating meals?: None Help from another person taking care of personal grooming?: A Little Help from another person toileting, which includes using toliet, bedpan, or urinal?: A Little Help from another person bathing (including washing, rinsing, drying)?: A Lot Help from another person to put on and taking off regular upper body clothing?: A Little Help from another person to put on and taking off regular lower body clothing?: A Lot 6 Click Score: 17    End of Session    OT Visit Diagnosis: Unsteadiness on feet (R26.81);Other abnormalities of gait and mobility (R26.89);Muscle weakness (generalized) (M62.81);Repeated falls (R29.6)   Activity Tolerance Patient tolerated treatment well   Patient Left in chair;with call bell/phone within reach;with chair alarm set   Nurse Communication          Time: [phone] OT Time Calculation (min): 31 min  Charges: OT General Charges $OT Visit: 1 Visit OT Treatments $Self Care/Home Management : 23-37 mins  Lesle Chris, OTR/L Acute  Rehabilitation Services [phone] [place] pager [phone] office 09/08/2019   Andreas Sobolewski 09/08/2019, 11:58 AM

## 2019-09-09 DIAGNOSIS — G8911 Acute pain due to trauma: Secondary | ICD-10-CM | POA: Diagnosis not present

## 2019-09-09 DIAGNOSIS — M62838 Other muscle spasm: Secondary | ICD-10-CM | POA: Diagnosis not present

## 2019-09-09 DIAGNOSIS — I10 Essential (primary) hypertension: Secondary | ICD-10-CM | POA: Diagnosis not present

## 2019-09-09 DIAGNOSIS — E440 Moderate protein-calorie malnutrition: Secondary | ICD-10-CM

## 2019-09-09 DIAGNOSIS — M6281 Muscle weakness (generalized): Secondary | ICD-10-CM | POA: Diagnosis not present

## 2019-09-09 DIAGNOSIS — R110 Nausea: Secondary | ICD-10-CM | POA: Diagnosis not present

## 2019-09-09 DIAGNOSIS — S72001D Fracture of unspecified part of neck of right femur, subsequent encounter for closed fracture with routine healing: Secondary | ICD-10-CM | POA: Diagnosis not present

## 2019-09-09 DIAGNOSIS — R2689 Other abnormalities of gait and mobility: Secondary | ICD-10-CM | POA: Diagnosis not present

## 2019-09-09 DIAGNOSIS — J310 Chronic rhinitis: Secondary | ICD-10-CM | POA: Diagnosis not present

## 2019-09-09 DIAGNOSIS — Z7401 Bed confinement status: Secondary | ICD-10-CM | POA: Diagnosis not present

## 2019-09-09 DIAGNOSIS — Z111 Encounter for screening for respiratory tuberculosis: Secondary | ICD-10-CM | POA: Diagnosis not present

## 2019-09-09 DIAGNOSIS — D649 Anemia, unspecified: Secondary | ICD-10-CM | POA: Diagnosis not present

## 2019-09-09 DIAGNOSIS — R52 Pain, unspecified: Secondary | ICD-10-CM | POA: Diagnosis not present

## 2019-09-09 DIAGNOSIS — M80051D Age-related osteoporosis with current pathological fracture, right femur, subsequent encounter for fracture with routine healing: Secondary | ICD-10-CM | POA: Diagnosis not present

## 2019-09-09 DIAGNOSIS — Z79899 Other long term (current) drug therapy: Secondary | ICD-10-CM | POA: Diagnosis not present

## 2019-09-09 DIAGNOSIS — E43 Unspecified severe protein-calorie malnutrition: Secondary | ICD-10-CM | POA: Diagnosis not present

## 2019-09-09 DIAGNOSIS — M810 Age-related osteoporosis without current pathological fracture: Secondary | ICD-10-CM | POA: Diagnosis not present

## 2019-09-09 DIAGNOSIS — Z03818 Encounter for observation for suspected exposure to other biological agents ruled out: Secondary | ICD-10-CM | POA: Diagnosis not present

## 2019-09-09 DIAGNOSIS — E559 Vitamin D deficiency, unspecified: Secondary | ICD-10-CM | POA: Diagnosis not present

## 2019-09-09 DIAGNOSIS — H04129 Dry eye syndrome of unspecified lacrimal gland: Secondary | ICD-10-CM | POA: Diagnosis not present

## 2019-09-09 DIAGNOSIS — M2550 Pain in unspecified joint: Secondary | ICD-10-CM | POA: Diagnosis not present

## 2019-09-09 DIAGNOSIS — R5381 Other malaise: Secondary | ICD-10-CM | POA: Diagnosis not present

## 2019-09-09 DIAGNOSIS — K5900 Constipation, unspecified: Secondary | ICD-10-CM | POA: Diagnosis not present

## 2019-09-09 NOTE — TOC Transition Note (Signed)
Transition of Care [facility]) - CM/SW Discharge Note   Patient Details  Name: Alicia Boyd MRN: [id number] Date of Birth: [birth year]  Transition of Care [facility]) CM/SW Contact:  Lia Hopping, [place] Phone Number: 09/09/2019, 10:10 AM   Clinical Narrative:    D/C this am.  [facility] called to transport.    Final next level of care: Home/Self Care Barriers to Discharge: No Barriers Identified   Patient Goals and CMS Choice Patient states their goals for this hospitalization and ongoing recovery are:: "I would like to go back home" CMS Medicare.gov Compare Post Acute Care list provided to:: Patient Choice offered to / list presented to : Patient  Discharge Placement   Existing PASRR number confirmed : 09/07/19          Patient chooses bed at: [facility] Patient to be transferred to facility by: [place] Name of family member notified: Juan Quam and Monia Pouch, [place] Patient and family notified of of transfer: 09/08/19  Discharge Plan and Services In-house Referral: Clinical Social Work   Post Acute Care Choice: [place]                               Social Determinants of Health (SDOH) Interventions     Readmission Risk Interventions No flowsheet data found.

## 2019-09-09 NOTE — Plan of Care (Signed)

## 2019-09-09 NOTE — Progress Notes (Signed)
Physical Therapy Treatment Patient Details Name: Alicia Boyd MRN: [id number] DOB: 05/05/1930 Today's Date: 09/09/2019    History of Present Illness 85 yo female adm iwth R hip fx s/p ANTERIOR APPROACH HEMI HIP ARTHROPLASTY BIPOLAR (Right)  PMH: hypertension, osteoporosis and recent pelvic fracture about 2-3 months ago    PT Comments    Pt is progressing well with therapy. Pt reported she was suppose to d/c to SNF yesterday and is confused as to why she didn't go. Pt is ambulating with min assist with mobility. Pt continues to require verbal cues for improved safety with transfers. Pt does have dec balance reactions and continues to rely heavily on UE use on RW to decrease WB on R LE. Recommend d/c to SNF when stable. Pt will continue to benefit from acute skilled PT until d/c to next venue of care.  Follow Up Recommendations  SNF     Equipment Recommendations  None recommended by PT    Recommendations for Other Services       Precautions / Restrictions Precautions Precautions: Fall Restrictions Weight Bearing Restrictions: No Other Position/Activity Restrictions: WBAT    Mobility  Bed Mobility Overal bed mobility: Needs Assistance Bed Mobility: Supine to Sit     Supine to sit: Min assist     General bed mobility comments: Assistance with R LE, use of bed rails and verbal cues  Transfers Overall transfer level: Needs assistance Equipment used: Rolling walker (2 wheeled) Transfers: Stand Pivot Transfers;Sit to/from Stand Sit to Stand: Min guard Stand pivot transfers: Min guard       General transfer comment: Min asist to stand from [facility] and cues for hand placement for increased safety with stand pivot transfers to recliner or commode.  Ambulation/Gait Ambulation/Gait assistance: Min guard Gait Distance (Feet): 150 Feet Assistive device: Rolling walker (2 wheeled) Gait Pattern/deviations: Step-through pattern;Decreased stride length;Decreased weight shift to  right Gait velocity: decr   General Gait Details: cues for RW position and sequence    Stairs             Wheelchair Mobility    Modified Rankin (Stroke Patients Only)       Balance Overall balance assessment: Needs assistance Sitting-balance support: No upper extremity supported;Feet supported Sitting balance-Leahy Scale: Good     Standing balance support: Bilateral upper extremity supported Standing balance-Leahy Scale: Fair                              Cognition Arousal/Alertness: Awake/alert Behavior During Therapy: WFL for tasks assessed/performed Overall Cognitive Status: Within Functional Limits for tasks assessed                                        Exercises Total Joint Exercises Ankle Circles/Pumps: AROM;Strengthening;Both;10 reps;Supine Quad Sets: AROM;Strengthening;Both;10 reps;Supine Heel Slides: AAROM;Strengthening;Right;10 reps;Supine Hip ABduction/ADduction: AAROM;Strengthening;Right;10 reps;Supine Long Arc Quad: AROM;Strengthening;Right;10 reps;Seated    General Comments        Pertinent Vitals/Pain Pain Assessment: 0-10 Pain Score: 5  Pain Location: right hip Pain Descriptors / Indicators: Discomfort Pain Intervention(s): Limited activity within patient's tolerance;Monitored during session    Home Living                      Prior Function            PT Goals (current goals can now be  found in the care plan section) Progress towards PT goals: Progressing toward goals    Frequency    Min 3X/week      PT Plan Current plan remains appropriate    Co-evaluation              AM-PAC PT "6 Clicks" Mobility   Outcome Measure  Help needed turning from your back to your side while in a flat bed without using bedrails?: A Little Help needed moving from lying on your back to sitting on the side of a flat bed without using bedrails?: A Little Help needed moving to and from a bed to a  chair (including a wheelchair)?: A Little Help needed standing up from a chair using your arms (e.g., wheelchair or bedside chair)?: A Little Help needed to walk in hospital room?: A Little Help needed climbing 3-5 steps with a railing? : A Little 6 Click Score: 18    End of Session Equipment Utilized During Treatment: Gait belt Activity Tolerance: Patient tolerated treatment well Patient left: in chair;with call bell/phone within reach;with chair alarm set Nurse Communication: Mobility status PT Visit Diagnosis: Difficulty in walking, not elsewhere classified (R26.2);Muscle weakness (generalized) (M62.81);History of falling (Z91.81)     Time: [id number] PT Time Calculation (min) (ACUTE ONLY): 25 min  Charges:  $Gait Training: 8-22 mins $Therapeutic Exercise: 8-22 mins                     Theodoro Grist, PT   Lelon Mast 09/09/2019, 9:07 AM

## 2019-09-09 NOTE — Progress Notes (Signed)
Report called to Lattie Haw at [facility]  All questions answered.  [facility] to transport

## 2019-09-13 DIAGNOSIS — M80051D Age-related osteoporosis with current pathological fracture, right femur, subsequent encounter for fracture with routine healing: Secondary | ICD-10-CM | POA: Diagnosis not present

## 2019-09-13 DIAGNOSIS — M6281 Muscle weakness (generalized): Secondary | ICD-10-CM | POA: Diagnosis not present

## 2019-09-13 DIAGNOSIS — G8911 Acute pain due to trauma: Secondary | ICD-10-CM | POA: Diagnosis not present

## 2019-09-13 DIAGNOSIS — D649 Anemia, unspecified: Secondary | ICD-10-CM | POA: Diagnosis not present

## 2019-09-13 DIAGNOSIS — I10 Essential (primary) hypertension: Secondary | ICD-10-CM | POA: Diagnosis not present

## 2019-09-15 ENCOUNTER — Other Ambulatory Visit: Payer: Self-pay | Admitting: *Deleted

## 2019-09-15 DIAGNOSIS — G8911 Acute pain due to trauma: Secondary | ICD-10-CM | POA: Diagnosis not present

## 2019-09-15 DIAGNOSIS — M80051D Age-related osteoporosis with current pathological fracture, right femur, subsequent encounter for fracture with routine healing: Secondary | ICD-10-CM | POA: Diagnosis not present

## 2019-09-15 DIAGNOSIS — I10 Essential (primary) hypertension: Secondary | ICD-10-CM | POA: Diagnosis not present

## 2019-09-15 DIAGNOSIS — D649 Anemia, unspecified: Secondary | ICD-10-CM | POA: Diagnosis not present

## 2019-09-15 DIAGNOSIS — M6281 Muscle weakness (generalized): Secondary | ICD-10-CM | POA: Diagnosis not present

## 2019-09-15 NOTE — Patient Outreach (Signed)
Member assessed for potential [facility] Care Management needs as a benefit of  [place] Medicare.  Member is currently receiving rehab therapy at [facility].  Member discussed in weekly telephonic IDT meeting with facility staff, [facility] UM team, and writer.  Per facility, Alicia Boyd lives alone. Has assistance thru [place] however. Facility states Alicia Boyd's contact person is her NP friend. Alicia Boyd is progressing well and likely to dc at the beginning of the week next week. Facility dc planner reports there are no identifiable [facility] CM needs at this time.   Will continue to follow for disposition plans, progression, and for potential [facility] Care Management needs.    Alicia Rolling, MSN-Ed, RN,BSN [place] Acute Care Coordinator [phone] [facility]) [phone]  (Toll free office)

## 2019-09-17 DIAGNOSIS — D649 Anemia, unspecified: Secondary | ICD-10-CM | POA: Diagnosis not present

## 2019-09-17 DIAGNOSIS — I10 Essential (primary) hypertension: Secondary | ICD-10-CM | POA: Diagnosis not present

## 2019-09-17 DIAGNOSIS — G8911 Acute pain due to trauma: Secondary | ICD-10-CM | POA: Diagnosis not present

## 2019-09-17 DIAGNOSIS — M80051D Age-related osteoporosis with current pathological fracture, right femur, subsequent encounter for fracture with routine healing: Secondary | ICD-10-CM | POA: Diagnosis not present

## 2019-09-17 DIAGNOSIS — M6281 Muscle weakness (generalized): Secondary | ICD-10-CM | POA: Diagnosis not present

## 2019-09-20 DIAGNOSIS — M6281 Muscle weakness (generalized): Secondary | ICD-10-CM | POA: Diagnosis not present

## 2019-09-20 DIAGNOSIS — G8911 Acute pain due to trauma: Secondary | ICD-10-CM | POA: Diagnosis not present

## 2019-09-20 DIAGNOSIS — D649 Anemia, unspecified: Secondary | ICD-10-CM | POA: Diagnosis not present

## 2019-09-20 DIAGNOSIS — I10 Essential (primary) hypertension: Secondary | ICD-10-CM | POA: Diagnosis not present

## 2019-09-20 DIAGNOSIS — M80051D Age-related osteoporosis with current pathological fracture, right femur, subsequent encounter for fracture with routine healing: Secondary | ICD-10-CM | POA: Diagnosis not present

## 2019-09-21 ENCOUNTER — Other Ambulatory Visit: Payer: Self-pay | Admitting: *Deleted

## 2019-09-21 NOTE — Patient Outreach (Signed)
Member assessed for potential [facility] Care Management needs as a benefit of [place] Medicare.  Per [facility] SNF update last week, member slated to dc early this week to home.  Telephone call made to [facility] home at [phone]. No answer and voicemail capability not set up on answering machine.  Inquiry sent to [place] planner to confirm disposition.   Will await response from dc planner or attempt to call member again.  Marthenia Rolling, MSN-Ed, RN,BSN [place] Acute Care Coordinator [phone] [facility]) [phone]  (Toll free office)

## 2019-09-22 ENCOUNTER — Other Ambulatory Visit: Payer: Self-pay | Admitting: *Deleted

## 2019-09-22 NOTE — Patient Outreach (Signed)
Member assessed for potential [facility] Care Management needs as a benefit of  [place] Medicare.  Member has been receiving rehab therapy at [facility].  Made aware by [facility] UM RN that member to dc home today with assistance of her NP friend from church.  Writer will sign off. No identifiable [facility] Care Management needs at this time.   Marthenia Rolling, MSN-Ed, RN,BSN [place] Acute Care Coordinator [phone] [facility]) [phone]  (Toll free office)

## 2019-09-23 DIAGNOSIS — Z9889 Other specified postprocedural states: Secondary | ICD-10-CM | POA: Diagnosis not present

## 2019-09-23 DIAGNOSIS — M25551 Pain in right hip: Secondary | ICD-10-CM | POA: Diagnosis not present

## 2019-09-27 ENCOUNTER — Telehealth: Payer: Self-pay | Admitting: Adult Health

## 2019-09-27 DIAGNOSIS — R634 Abnormal weight loss: Secondary | ICD-10-CM | POA: Diagnosis not present

## 2019-09-27 DIAGNOSIS — F329 Major depressive disorder, single episode, unspecified: Secondary | ICD-10-CM | POA: Diagnosis not present

## 2019-09-27 DIAGNOSIS — E43 Unspecified severe protein-calorie malnutrition: Secondary | ICD-10-CM | POA: Diagnosis not present

## 2019-09-27 DIAGNOSIS — M80051D Age-related osteoporosis with current pathological fracture, right femur, subsequent encounter for fracture with routine healing: Secondary | ICD-10-CM | POA: Diagnosis not present

## 2019-09-27 DIAGNOSIS — Z96651 Presence of right artificial knee joint: Secondary | ICD-10-CM | POA: Diagnosis not present

## 2019-09-27 DIAGNOSIS — Z9181 History of falling: Secondary | ICD-10-CM | POA: Diagnosis not present

## 2019-09-27 DIAGNOSIS — Z634 Disappearance and death of family member: Secondary | ICD-10-CM | POA: Diagnosis not present

## 2019-09-27 DIAGNOSIS — Z7982 Long term (current) use of aspirin: Secondary | ICD-10-CM | POA: Diagnosis not present

## 2019-09-27 DIAGNOSIS — Z96641 Presence of right artificial hip joint: Secondary | ICD-10-CM | POA: Diagnosis not present

## 2019-09-27 DIAGNOSIS — I10 Essential (primary) hypertension: Secondary | ICD-10-CM | POA: Diagnosis not present

## 2019-09-27 DIAGNOSIS — D649 Anemia, unspecified: Secondary | ICD-10-CM | POA: Diagnosis not present

## 2019-09-27 NOTE — Telephone Encounter (Signed)
The patient had an emrergency hip surgery a month ago and was in [facility] for rehab. The patient was told that she needs to follow up with her provider. The patient also has feet swelling and SOB.   Please Advise.

## 2019-09-27 NOTE — Telephone Encounter (Signed)
Spoke with Pt she is not having any blurred vision, dizziness or headaches.Pt states she is having sob and swelling in legs and advised to go to ED. Pt states she will go to ED if it gets worse

## 2019-09-27 NOTE — Telephone Encounter (Signed)
Please advise this is a [facility] patient

## 2019-09-28 ENCOUNTER — Other Ambulatory Visit: Payer: Self-pay | Admitting: *Deleted

## 2019-09-28 NOTE — Patient Outreach (Signed)
[facility]) Care Management  09/28/2019  MADY BREAUX September 08, 1930 [id number]    EMMI-GENERAL DISCHARGE  RED ON EMMI ALERT Day # 4 Date: 09/27/2019 Red Alert Reason:Don't know who to call about changes in condition/Question or problems.   Outreach attempt #1  RN attempted outreach however pt not available and RN unable to leave a HIPPA message as voice mail was not set up.   Plan: RN CM will outreach once again with in 4 business days and inquired further on the above.  Raina Mina, RN Care Management Coordinator [place] Office [phone]

## 2019-09-29 ENCOUNTER — Ambulatory Visit: Payer: MEDICARE | Admitting: Family Medicine

## 2019-10-01 ENCOUNTER — Ambulatory Visit: Payer: MEDICARE | Admitting: Adult Health

## 2019-10-01 DIAGNOSIS — E43 Unspecified severe protein-calorie malnutrition: Secondary | ICD-10-CM | POA: Diagnosis not present

## 2019-10-01 DIAGNOSIS — D649 Anemia, unspecified: Secondary | ICD-10-CM | POA: Diagnosis not present

## 2019-10-01 DIAGNOSIS — Z96641 Presence of right artificial hip joint: Secondary | ICD-10-CM | POA: Diagnosis not present

## 2019-10-01 DIAGNOSIS — M80051D Age-related osteoporosis with current pathological fracture, right femur, subsequent encounter for fracture with routine healing: Secondary | ICD-10-CM | POA: Diagnosis not present

## 2019-10-01 DIAGNOSIS — R634 Abnormal weight loss: Secondary | ICD-10-CM | POA: Diagnosis not present

## 2019-10-01 DIAGNOSIS — I10 Essential (primary) hypertension: Secondary | ICD-10-CM | POA: Diagnosis not present

## 2019-10-03 ENCOUNTER — Emergency Department (HOSPITAL_COMMUNITY): Payer: MEDICARE

## 2019-10-03 ENCOUNTER — Other Ambulatory Visit: Payer: Self-pay

## 2019-10-03 ENCOUNTER — Encounter (HOSPITAL_COMMUNITY): Payer: Self-pay

## 2019-10-03 ENCOUNTER — Inpatient Hospital Stay (HOSPITAL_COMMUNITY)
Admission: EM | Admit: 2019-10-03 | Discharge: 2019-10-07 | DRG: 292 | Disposition: A | Discharge status: Home Health | Payer: MEDICARE | Attending: Family Medicine | Admitting: Family Medicine

## 2019-10-03 DIAGNOSIS — I10 Essential (primary) hypertension: Secondary | ICD-10-CM | POA: Diagnosis present

## 2019-10-03 DIAGNOSIS — J90 Pleural effusion, not elsewhere classified: Secondary | ICD-10-CM | POA: Diagnosis not present

## 2019-10-03 DIAGNOSIS — Z8249 Family history of ischemic heart disease and other diseases of the circulatory system: Secondary | ICD-10-CM

## 2019-10-03 DIAGNOSIS — Z20828 Contact with and (suspected) exposure to other viral communicable diseases: Secondary | ICD-10-CM | POA: Diagnosis not present

## 2019-10-03 DIAGNOSIS — Z23 Encounter for immunization: Secondary | ICD-10-CM

## 2019-10-03 DIAGNOSIS — Z681 Body mass index (BMI) 19 or less, adult: Secondary | ICD-10-CM | POA: Diagnosis not present

## 2019-10-03 DIAGNOSIS — Z888 Allergy status to other drugs, medicaments and biological substances status: Secondary | ICD-10-CM

## 2019-10-03 DIAGNOSIS — Z96641 Presence of right artificial hip joint: Secondary | ICD-10-CM | POA: Diagnosis present

## 2019-10-03 DIAGNOSIS — I5030 Unspecified diastolic (congestive) heart failure: Secondary | ICD-10-CM

## 2019-10-03 DIAGNOSIS — E876 Hypokalemia: Secondary | ICD-10-CM

## 2019-10-03 DIAGNOSIS — R0602 Shortness of breath: Secondary | ICD-10-CM | POA: Diagnosis not present

## 2019-10-03 DIAGNOSIS — F418 Other specified anxiety disorders: Secondary | ICD-10-CM | POA: Diagnosis present

## 2019-10-03 DIAGNOSIS — D638 Anemia in other chronic diseases classified elsewhere: Secondary | ICD-10-CM | POA: Diagnosis present

## 2019-10-03 DIAGNOSIS — R05 Cough: Secondary | ICD-10-CM

## 2019-10-03 DIAGNOSIS — Z881 Allergy status to other antibiotic agents status: Secondary | ICD-10-CM

## 2019-10-03 DIAGNOSIS — S72001D Fracture of unspecified part of neck of right femur, subsequent encounter for closed fracture with routine healing: Secondary | ICD-10-CM

## 2019-10-03 DIAGNOSIS — E785 Hyperlipidemia, unspecified: Secondary | ICD-10-CM | POA: Diagnosis present

## 2019-10-03 DIAGNOSIS — H35033 Hypertensive retinopathy, bilateral: Secondary | ICD-10-CM | POA: Diagnosis present

## 2019-10-03 DIAGNOSIS — R0600 Dyspnea, unspecified: Secondary | ICD-10-CM | POA: Diagnosis not present

## 2019-10-03 DIAGNOSIS — R609 Edema, unspecified: Secondary | ICD-10-CM

## 2019-10-03 DIAGNOSIS — R627 Adult failure to thrive: Secondary | ICD-10-CM | POA: Diagnosis present

## 2019-10-03 DIAGNOSIS — I472 Ventricular tachycardia: Secondary | ICD-10-CM | POA: Diagnosis not present

## 2019-10-03 DIAGNOSIS — E8770 Fluid overload, unspecified: Secondary | ICD-10-CM

## 2019-10-03 DIAGNOSIS — K219 Gastro-esophageal reflux disease without esophagitis: Secondary | ICD-10-CM | POA: Diagnosis present

## 2019-10-03 DIAGNOSIS — Z66 Do not resuscitate: Secondary | ICD-10-CM | POA: Diagnosis not present

## 2019-10-03 DIAGNOSIS — I110 Hypertensive heart disease with heart failure: Principal | ICD-10-CM | POA: Diagnosis present

## 2019-10-03 DIAGNOSIS — R600 Localized edema: Secondary | ICD-10-CM | POA: Diagnosis not present

## 2019-10-03 DIAGNOSIS — J189 Pneumonia, unspecified organism: Secondary | ICD-10-CM | POA: Diagnosis not present

## 2019-10-03 DIAGNOSIS — R059 Cough, unspecified: Secondary | ICD-10-CM

## 2019-10-03 DIAGNOSIS — R011 Cardiac murmur, unspecified: Secondary | ICD-10-CM | POA: Diagnosis present

## 2019-10-03 DIAGNOSIS — Z79899 Other long term (current) drug therapy: Secondary | ICD-10-CM

## 2019-10-03 DIAGNOSIS — M810 Age-related osteoporosis without current pathological fracture: Secondary | ICD-10-CM | POA: Diagnosis present

## 2019-10-03 DIAGNOSIS — Z7982 Long term (current) use of aspirin: Secondary | ICD-10-CM

## 2019-10-03 DIAGNOSIS — Z880 Allergy status to penicillin: Secondary | ICD-10-CM

## 2019-10-03 DIAGNOSIS — R509 Fever, unspecified: Secondary | ICD-10-CM | POA: Diagnosis not present

## 2019-10-03 DIAGNOSIS — R7989 Other specified abnormal findings of blood chemistry: Secondary | ICD-10-CM

## 2019-10-03 DIAGNOSIS — Z8262 Family history of osteoporosis: Secondary | ICD-10-CM

## 2019-10-03 LAB — URINALYSIS, ROUTINE W REFLEX MICROSCOPIC
Bilirubin Urine: NEGATIVE
Glucose, UA: NEGATIVE mg/dL
Hgb urine dipstick: NEGATIVE
Ketones, ur: NEGATIVE mg/dL
Leukocytes,Ua: NEGATIVE
Nitrite: NEGATIVE
Protein, ur: NEGATIVE mg/dL
Specific Gravity, Urine: 1.006 (ref 1.005–1.030)
pH: 7 (ref 5.0–8.0)

## 2019-10-03 LAB — COMPREHENSIVE METABOLIC PANEL
ALT: 21 U/L (ref 0–44)
AST: 16 U/L (ref 15–41)
Albumin: 3.6 g/dL (ref 3.5–5.0)
Alkaline Phosphatase: 127 U/L — ABNORMAL HIGH (ref 38–126)
Anion gap: 8 (ref 5–15)
BUN: 16 mg/dL (ref 8–23)
CO2: 28 mmol/L (ref 22–32)
Calcium: 9.1 mg/dL (ref 8.9–10.3)
Chloride: 103 mmol/L (ref 98–111)
Creatinine, Ser: 0.34 mg/dL — ABNORMAL LOW (ref 0.44–1.00)
GFR calc Af Amer: >60 mL/min (ref >60)
GFR calc non Af Amer: >60 mL/min (ref >60)
Glucose, Bld: 105 mg/dL — ABNORMAL HIGH (ref 70–99)
Potassium: 3.2 mmol/L — ABNORMAL LOW (ref 3.5–5.1)
Sodium: 139 mmol/L (ref 135–145)
Total Bilirubin: 0.6 mg/dL (ref 0.3–1.2)
Total Protein: 6.1 g/dL — ABNORMAL LOW (ref 6.5–8.1)

## 2019-10-03 LAB — CBC WITH DIFFERENTIAL/PLATELET
Abs Immature Granulocytes: 0.02 10*3/uL (ref 0.00–0.07)
Basophils Absolute: 0.1 10*3/uL (ref 0.0–0.1)
Basophils Relative: 1 %
Eosinophils Absolute: 0.1 10*3/uL (ref 0.0–0.5)
Eosinophils Relative: 2 %
HCT: 34.9 % — ABNORMAL LOW (ref 36.0–46.0)
Hemoglobin: 11.2 g/dL — ABNORMAL LOW (ref 12.0–15.0)
Immature Granulocytes: 0 %
Lymphocytes Relative: 17 %
Lymphs Abs: 1.3 10*3/uL (ref 0.7–4.0)
MCH: 31.4 pg (ref 26.0–34.0)
MCHC: 32.1 g/dL (ref 30.0–36.0)
MCV: 97.8 fL (ref 80.0–100.0)
Monocytes Absolute: 0.5 10*3/uL (ref 0.1–1.0)
Monocytes Relative: 7 %
Neutro Abs: 5.3 10*3/uL (ref 1.7–7.7)
Neutrophils Relative %: 73 %
Platelets: 297 10*3/uL (ref 150–400)
RBC: 3.57 MIL/uL — ABNORMAL LOW (ref 3.87–5.11)
RDW: 13.1 % (ref 11.5–15.5)
WBC: 7.3 10*3/uL (ref 4.0–10.5)
nRBC: 0 % (ref 0.0–0.2)

## 2019-10-03 LAB — BRAIN NATRIURETIC PEPTIDE: B Natriuretic Peptide: 525.2 pg/mL — ABNORMAL HIGH (ref 0.0–100.0)

## 2019-10-03 LAB — TROPONIN I (HIGH SENSITIVITY)
Troponin I (High Sensitivity): 24 ng/L — ABNORMAL HIGH (ref <18)
Troponin I (High Sensitivity): 23 ng/L — ABNORMAL HIGH (ref <18)

## 2019-10-03 LAB — SARS CORONAVIRUS 2 BY RT PCR (HOSPITAL ORDER, PERFORMED IN ~~LOC~~ HOSPITAL LAB): SARS Coronavirus 2: NEGATIVE

## 2019-10-03 MED ORDER — SODIUM CHLORIDE 0.9 % IV SOLN
2.0000 g | Freq: Once | INTRAVENOUS | Status: DC
  Filled 2019-10-03: qty 2

## 2019-10-03 MED ORDER — SODIUM CHLORIDE (PF) 0.9 % IJ SOLN
INTRAMUSCULAR | Status: AC
  Filled 2019-10-03: qty 50

## 2019-10-03 MED ORDER — IOHEXOL 350 MG/ML SOLN
100.0000 mL | Freq: Once | INTRAVENOUS | Status: AC | PRN
  Administered 2019-10-03: 21:00:00 100 mL via INTRAVENOUS

## 2019-10-03 MED ORDER — VANCOMYCIN HCL IN DEXTROSE 1-5 GM/200ML-% IV SOLN: 1000.0000 mg | Freq: Once | INTRAVENOUS | Status: DC

## 2019-10-03 NOTE — ED Triage Notes (Signed)
Patient arrived via [facility] from home. Patient is AOx4 and ambulatory with 4-point walker. Patient had right hip surgery about 4 weeks ago. Patient chief complaint is SOB that has been going on since patient was discharged after surgery. Patient does have bilateral lower extremity edema. Patient is not on any blood thinners per patient. Patient is 91-93% on room air. Patient put on supplemental O2 by [place] for comfort. Patient is currently not complaining of any pain at this time.

## 2019-10-03 NOTE — ED Notes (Signed)
Pt back from radiology 

## 2019-10-03 NOTE — ED Provider Notes (Signed)
[facility] DEPT Provider Note   CSN: [id number] Arrival date & time: 10/03/19  [id number]     History   Chief Complaint No chief complaint on file.   HPI Alicia Boyd is a 85 y.o. female.     The history is provided by the patient. No language interpreter was used.  Shortness of Breath Severity:  Moderate Onset quality:  Gradual Duration:  4 weeks Timing:  Constant Progression:  Worsening Chronicity:  New Relieved by:  Nothing Worsened by:  Exertion Ineffective treatments:  None tried Associated symptoms: no chest pain, no claudication, no cough, no diaphoresis, no fever, no headaches, no rash, no sputum production and no wheezing   Risk factors: recent surgery     Past Medical History:  Diagnosis Date   Aortic heart murmur    AS/AI on 2D ECHO ; SBE prophylaxis Rxed   Chest pain 2007 ; 2011   negative cardiac assessment   Hyperlipidemia    Interstitial cystitis    Resolved   Ocular migraine 04/10/2012   Osteoporosis    T score - 3.0 @ femoral neck; Fosamax affected swallowing   Postmenopausal    No PMH or HRT    Patient Active Problem List   Diagnosis Date Noted   Malnutrition of moderate degree 09/08/2019   Displaced fracture of right femoral neck ([facility]) 09/03/2019   Failure to thrive in adult 09/01/2019   Quadriceps strain, right, initial encounter 09/01/2019   Anxiety and depression 07/05/2019   Greater trochanteric bursitis of left hip 06/22/2019   Closed fracture of multiple pubic rami, right, initial encounter ([place]) 05/28/2019   Greater trochanteric bursitis of right hip 01/05/2019   Hypertensive retinopathy, bilateral 04/29/2018   Nuclear sclerosis of both eyes 04/29/2018   Degenerative arthritis of left knee 04/16/2018   Mild carpal tunnel syndrome of right wrist 09/24/2017   Ecchymoses, spontaneous 07/29/2016   Varicose veins 07/29/2016   Nasal congestion 07/29/2016   Hyperglycemia 07/12/2015     Retinal hemorrhage, right 06/06/2014   Paresthesia of hand 03/22/2014   Undiagnosed cardiac murmurs 03/22/2014   Cataract 04/10/2012   Ocular migraine 04/10/2012   Palpitations 03/03/2012   ANEMIA, MILD 07/27/2010   Venous (peripheral) insufficiency 07/27/2010   OTHER CONSTIPATION 04/17/2010   Essential hypertension 04/06/2010   RHINOCONJUNCTIVITIS, ALLERGIC 04/04/2010   PERSONAL HISTORY OTHER DISORDER URINARY SYSTEM 01/25/2010   OTHER DYSPHAGIA 04/11/2009   ESOPHAGEAL REFLUX 01/24/2009   OTHER AND UNSPECIFIED HYPERLIPIDEMIA 11/23/2008   Osteoporosis 11/23/2008   NONSPECIFIC ABNORMAL ELECTROCARDIOGRAM 11/23/2008    Past Surgical History:  Procedure Laterality Date   ANTERIOR APPROACH HEMI HIP ARTHROPLASTY Right 09/03/2019   Procedure: ANTERIOR APPROACH HEMI HIP ARTHROPLASTY BIPOLAR;  Surgeon: [name], MD;  Location: [facility];  Service: Orthopedics;  Laterality: Right;   COLONOSCOPY  2011   Dr Sharlett Iles     OB History   No obstetric history on file.      Home Medications    Prior to Admission medications   Medication Sig Start Date End Date Taking? Authorizing Provider  acetaminophen (TYLENOL) 500 MG tablet Take 1,000 mg by mouth every 6 (six) hours as needed for moderate pain.    [provider]  amLODipine (NORVASC) 5 MG tablet TAKE 1 TABLET BY MOUTH EVERY DAY Patient taking differently: Take 5 mg by mouth every evening.  12/29/18   Nafziger, [name], NP  aspirin EC 325 MG tablet Take 1 tablet (325 mg total) by mouth daily after breakfast. Take x 1  month post op to decrease risk of blood clots. 09/03/19   Gary Fleet, PA-C  Cholecalciferol (VITAMIN D3) 1000 UNITS CAPS Take 1 capsule by mouth daily.     [provider]  docusate sodium (COLACE) 100 MG capsule Take 1 capsule (100 mg total) by mouth 2 (two) times daily. 09/08/19   Sheikh, Annabella, DO  feeding supplement, ENSURE ENLIVE, (ENSURE ENLIVE) LIQD Take 237 mLs by mouth 2 (two)  times daily between meals. 09/08/19   Raiford Noble Latif, DO  ferrous sulfate 325 (65 FE) MG tablet Take 1 tablet (325 mg total) by mouth daily with breakfast. 09/09/19   Raiford Noble Latif, DO  fexofenadine (ALLEGRA) 180 MG tablet Take 180 mg by mouth daily as needed for allergies.     [provider]  methocarbamol (ROBAXIN) 500 MG tablet Take 1 tablet (500 mg total) by mouth every 6 (six) hours as needed for muscle spasms. 09/08/19   Raiford Noble Latif, DO  metoprolol succinate (TOPROL-XL) 25 MG 24 hr tablet TAKE 1 TABLET BY MOUTH EVERY DAY Patient taking differently: Take 25 mg by mouth daily.  07/08/19   Nafziger, [name], NP  Multiple Vitamin (MULTIVITAMIN) capsule Take 1 capsule by mouth daily.      [provider]  ondansetron (ZOFRAN) 4 MG tablet Take 1 tablet (4 mg total) by mouth every 6 (six) hours as needed for nausea. 09/08/19   Sheikh, Georgina Quint Latif, DO  Polyethyl Glycol-Propyl Glycol (SYSTANE OP) Apply 1 drop to eye daily as needed (dry eyes).     [provider]  polyethylene glycol (MIRALAX / GLYCOLAX) 17 g packet Take 17 g by mouth 2 (two) times daily. 09/08/19   Kerney Elbe, DO    Family History Family History  Problem Relation Age of Onset   Diabetes Father        Pancreatitic insufficiency post MVA   Hypertension Mother    Osteoporosis Mother    Heart disease Mother        AF   Thyroid disease Sister        hypothyroidism   Coronary artery disease Sister        AF   Stroke Sister 15       also pulmonary disease    Social History Social History   Tobacco Use   Smoking status: Never Smoker   Smokeless tobacco: Never Used  Substance Use Topics   Alcohol use: No   Drug use: No     Allergies   Amitid [amitriptyline hcl], Metronidazole, Penicillins, Alendronate sodium, Clarithromycin, Flonase [fluticasone propionate], Promethazine hcl, Robitussin (alcohol free) [guaifenesin], Spironolactone, and Singulair [montelukast  sodium]   Review of Systems Review of Systems  Constitutional: Positive for fatigue. Negative for chills, diaphoresis and fever.  HENT: Negative for congestion.   Respiratory: Positive for chest tightness and shortness of breath. Negative for cough, sputum production, wheezing and stridor.   Cardiovascular: Positive for leg swelling. Negative for chest pain, palpitations and claudication.  Skin: Negative for rash.  Neurological: Negative for headaches.  All other systems reviewed and are negative.    Physical Exam Updated Vital Signs BP (!) 164/59 (BP Location: Right Arm)    Pulse 80    Temp 98.4 F (36.9 C) (Oral)    Resp (!) 25    Ht 5\' 5"  (1.651 m)    SpO2 95%    BMI 17.47 kg/m   Physical Exam Vitals signs reviewed.  Constitutional:      General: She is  not in acute distress.    Appearance: Normal appearance. She is not ill-appearing, toxic-appearing or diaphoretic.  HENT:     Head: Normocephalic.     Nose: No congestion or rhinorrhea.     Mouth/Throat:     Mouth: Mucous membranes are moist.     Pharynx: No oropharyngeal exudate or posterior oropharyngeal erythema.  Eyes:     Extraocular Movements: Extraocular movements intact.     Conjunctiva/sclera: Conjunctivae normal.     Pupils: Pupils are equal, round, and reactive to light.  Neck:     Musculoskeletal: No muscular tenderness.  Cardiovascular:     Rate and Rhythm: Normal rate and regular rhythm.     Pulses: Normal pulses.     Heart sounds: Murmur present.  Pulmonary:     Effort: Pulmonary effort is normal. No respiratory distress.     Breath sounds: Rhonchi and rales present.  Chest:     Chest wall: No tenderness.  Abdominal:     General: Abdomen is flat.     Tenderness: There is no abdominal tenderness.  Musculoskeletal:        General: No tenderness or signs of injury.     Right lower leg: Edema present.     Left lower leg: Edema present.  Skin:    Capillary Refill: Capillary refill takes less than 2  seconds.     Findings: No rash.  Neurological:     General: No focal deficit present.     Mental Status: She is alert.  Psychiatric:        Mood and Affect: Mood normal.      ED Treatments / Results  Labs (all labs ordered are listed, but only abnormal results are displayed) Labs Reviewed  COMPREHENSIVE METABOLIC PANEL - Abnormal; Notable for the following components:      Result Value   Potassium 3.2 (*)    Glucose, Bld 105 (*)    Creatinine, Ser 0.34 (*)    Total Protein 6.1 (*)    Alkaline Phosphatase 127 (*)    All other components within normal limits  CBC WITH DIFFERENTIAL/PLATELET - Abnormal; Notable for the following components:   RBC 3.57 (*)    Hemoglobin 11.2 (*)    HCT 34.9 (*)    All other components within normal limits  URINALYSIS, ROUTINE W REFLEX MICROSCOPIC - Abnormal; Notable for the following components:   Color, Urine STRAW (*)    All other components within normal limits  BRAIN NATRIURETIC PEPTIDE - Abnormal; Notable for the following components:   B Natriuretic Peptide 525.2 (*)    All other components within normal limits  TROPONIN I (HIGH SENSITIVITY) - Abnormal; Notable for the following components:   Troponin I (High Sensitivity) 24 (*)    All other components within normal limits  TROPONIN I (HIGH SENSITIVITY) - Abnormal; Notable for the following components:   Troponin I (High Sensitivity) 23 (*)    All other components within normal limits  SARS CORONAVIRUS 2 (HOSPITAL ORDER, [facility])  CULTURE, BLOOD (ROUTINE X 2)  CULTURE, BLOOD (ROUTINE X 2)    EKG EKG Interpretation  Date/Time:  Sunday October 03 2019 18:55:02 EDT Ventricular Rate:  83 PR Interval:    QRS Duration: 72 QT Interval:  377 QTC Calculation: 443 R Axis:   61 Text Interpretation:  Sinus rhythm Probable anteroseptal infarct, recent When compared to prior, no signifciant changes seen.  No STEMI Confirmed by Antony Blackbird [phone]) on 10/03/2019  7:26:19  PM   Radiology Dg Chest 2 View  Result Date: 10/03/2019 CLINICAL DATA:  Shortness of breath since right hip surgery 4 weeks ago. Bilateral lower extremity swelling. Fever. EXAM: CHEST - 2 VIEW COMPARISON:  05/05/2019 FINDINGS: Decreased inspiration. No gross change in mild enlargement of the cardiac silhouette and prominence of the pulmonary vasculature. Interval moderate-sized bilateral pleural effusions and mild bibasilar patchy and linear density. Diffuse osteopenia. IMPRESSION: 1. Interval moderate-sized bilateral pleural effusions and mild bibasilar atelectasis and possible pneumonia. 2. Stable cardiomegaly and pulmonary vascular congestion. Electronically Signed   By: Claudie Revering M.D.   On: 10/03/2019 19:26   Ct Angio Chest Pe W And/or Wo Contrast  Result Date: 10/03/2019 CLINICAL DATA:  Right hip surgery, shortness of breath EXAM: CT ANGIOGRAPHY CHEST WITH CONTRAST TECHNIQUE: Multidetector CT imaging of the chest was performed using the standard protocol during bolus administration of intravenous contrast. Multiplanar CT image reconstructions and MIPs were obtained to evaluate the vascular anatomy. CONTRAST:  158mL OMNIPAQUE IOHEXOL 350 MG/ML SOLN COMPARISON:  None. FINDINGS: Cardiovascular: Satisfactory opacification of the pulmonary arteries to the segmental level. No evidence of pulmonary embolism. Cardiomegaly. Three-vessel coronary artery calcifications. No pericardial effusion. Aortic atherosclerosis. Mediastinum/Nodes: No enlarged mediastinal, hilar, or axillary lymph nodes. Calcified right hilar lymph nodes. Thyroid gland, trachea, and esophagus demonstrate no significant findings. Lungs/Pleura: Small to moderate bilateral pleural effusions and associated atelectasis or consolidation. Mild interlobular septal thickening (series 10, image 18). Benign calcified pulmonary nodule of the right upper lobe. Upper Abdomen: No acute abnormality. Musculoskeletal: No chest wall abnormality. No  acute or significant osseous findings. Benign, trabeculated hemangioma of T6 vertebral body. Review of the MIP images confirms the above findings. IMPRESSION: 1.  Negative examination for pulmonary embolism. 2. Small to moderate bilateral pleural effusions and associated atelectasis or consolidation. Mild interlobular septal thickening (series 10, image 18). Findings are consistent with pulmonary edema. 3.  Cardiomegaly and three-vessel coronary artery calcifications. 4.  Aortic Atherosclerosis (ICD10-I70.0). Electronically Signed   By: Eddie Candle M.D.   On: 10/03/2019 21:02    Procedures Procedures (including critical care time)  CRITICAL CARE Performed by: Gwenyth Allegra Shaida Route Total critical care time: 35 minutes Critical care time was exclusive of separately billable procedures and treating other patients. Critical care was necessary to treat or prevent imminent or life-threatening deterioration. Critical care was time spent personally by me on the following activities: development of treatment plan with patient and/or surrogate as well as nursing, discussions with consultants, evaluation of patient's response to treatment, examination of patient, obtaining history from patient or surrogate, ordering and performing treatments and interventions, ordering and review of laboratory studies, ordering and review of radiographic studies, pulse oximetry and re-evaluation of patient's condition.   Medications Ordered in ED Medications  sodium chloride (PF) 0.9 % injection (has no administration in time range)  aztreonam (AZACTAM) 2 g in sodium chloride 0.9 % 100 mL IVPB (has no administration in time range)  iohexol (OMNIPAQUE) 350 MG/ML injection 100 mL (100 mLs Intravenous Contrast Given 10/03/19 2035)     Initial Impression / Assessment and Plan / ED Course  I have reviewed the triage vital signs and the nursing notes.  Pertinent labs & imaging results that were available during my care of  the patient were reviewed by me and considered in my medical decision making (see chart for details).        Alicia Boyd is a 85 y.o. female with a past medical history significant for peripheral insufficiency,  hypertension, esophageal reflux, hyperlipidemia, and osteo-porosis who presents with worsening lower extremity edema and exertional shortness of breath.  Patient reports that she had a hip surgery 4 weeks ago and since that time has had gradually worsening symptoms. she reports that in the last week, her exertional shortness of breath is acutely worsening.  She reports her legs are more swollen bilaterally.  She denies history of DVT or PE.  She reports no significant pain in her hips or legs.  She denies any chest pain but reports she is feeling very short of breath.  She denies palpitations, nausea vomiting, or GI symptoms.  She does report some polyuria.  Patient reports she has been having coughing with her shortness of breath.  On exam, legs are edematous but she has good pulses in the legs.  Normal sensation and strength in legs.  Abdomen is nontender and chest is nontender.  Patient has rales in the bases of both lungs and a slight murmur.  Good pulses in upper extremities.  Clinically I am concerned about new heart failure and fluid overload versus pulmonary ballismus given her recent hip surgery.  Patient will have CT PE study and labs.  Given her worsening over the last week, patient may require admission depending on results.  11:22 PM Work-up is begun to return.  Patient has elevated but stable troponin.  Coronavirus test negative.  Metabolic panel shows no kidney function elevation and mild hypokalemia.  Mild anemia with no leukocytosis.  Most concerning is the patient's BNP of 525 as she has no history of CHF or cardiac disease.  The CT scan also not any evidence of pulmonary embolism but does show moderate bilateral pleural effusions and consolidation.  This is likely  consistent with pulmonary edema and x-rays concerning for possible pneumonia.  Given her cough and shortness of breath in the setting of recent admission, patient will be given antibiotics for pneumonia and will be admitted for work-up of likely new heart failure.  Hospitalist team called for admission.  Final Clinical Impressions(s) / ED Diagnoses   Final diagnoses:  Hypervolemia, unspecified hypervolemia type  Elevated brain natriuretic peptide (BNP) level  Cough  Peripheral edema  Shortness of breath  HCAP (healthcare-associated pneumonia)     Clinical Impression: 1. Hypervolemia, unspecified hypervolemia type   2. Elevated brain natriuretic peptide (BNP) level   3. Cough   4. Peripheral edema   5. Shortness of breath   6. HCAP (healthcare-associated pneumonia)     Disposition: Admit  This note was prepared with assistance of Dragon voice recognition software. Occasional wrong-word or sound-a-like substitutions may have occurred due to the inherent limitations of voice recognition software.     Andrzej Scully, Gwenyth Allegra, MD 10/03/19 [phone]

## 2019-10-03 NOTE — ED Notes (Signed)
Pt aware that urine sample is needed.  

## 2019-10-03 NOTE — H&P (Signed)
History and Physical    DAKIRA Boyd [id number] DOB: 09/04/1930 DOA: 10/03/2019  PCP: [name], NP  Patient coming from: Home  I have personally briefly reviewed patient's old medical records in [place]  Chief Complaint: Shortness of breath  HPI: Alicia Boyd is a 85 y.o. female with medical history significant for hypertension, hyperlipidemia, and osteoporosis who presents to the ED for evaluation of shortness of breath and swelling in her legs.  Patient was recently hospitalized from 09/03/2019-09/09/2019 for a right hip fracture and underwent right hemiarthroplasty on 09/03/2019.  She was discharged on aspirin 325 mg twice daily for 1 month for VTE prophylaxis.  She was discharged to a skilled nursing facility and is now returned to home where she lives alone.  Patient states that she first noticed having dyspnea on exertion when working with her therapist at the skilled nursing facility.  She has had continued shortness of breath with exertional activity and has noticed some swelling in both of her lower legs.  She reported having occasional palpitations and chest discomfort.  She has had infrequent cough without sputum production.  She denies any subjective fevers, chills, or diaphoresis.  She has had increased urinary frequency without dysuria.  She denies any abdominal pain, diarrhea, or constipation.    She has been ambulating with the use of a walker at home but activity is limited due to her dyspnea.  Her symptoms worsened yesterday and she presented to the ED for further evaluation after discussing with her primary care provider.  ED Course:  Initial vitals showed BP 180/94, pulse 84, RR 18, temp 98.4 Fahrenheit, SPO2 98% on room air.  Labs notable for WBC 7.3, hemoglobin 11.2, platelets 297,000, potassium 3.2, bicarb 28, BUN 16, creatinine 0.34, BNP 525.2, high-sensitivity troponin I 24 and 23.  Urinalysis was negative for UTI.  SARS-CoV-2 test is negative.   Blood cultures were ordered and pending.  2 view chest x-ray showed new moderate sized bilateral pleural effusions and mild bibasilar atelectasis with stable cardiomegaly and pulmonary vascular congestion.  CTA chest PE study was negative for pulmonary embolism and showed small to moderate bilateral pleural effusions, cardiomegaly and three-vessel coronary artery calcifications were noted.  Patient was ordered to receive IV vancomycin and aztreonam and the hospitalist service was consulted to admit for further evaluation and management.  Review of Systems: All systems reviewed and are negative except as documented in history of present illness above.   Past Medical History:  Diagnosis Date  . Aortic heart murmur    AS/AI on 2D ECHO ; SBE prophylaxis Rxed  . Chest pain 2007 ; 2011   negative cardiac assessment  . Hyperlipidemia   . Interstitial cystitis    Resolved  . Ocular migraine 04/10/2012  . Osteoporosis    T score - 3.0 @ femoral neck; Fosamax affected swallowing  . Postmenopausal    No PMH or HRT    Past Surgical History:  Procedure Laterality Date  . ANTERIOR APPROACH HEMI HIP ARTHROPLASTY Right 09/03/2019   Procedure: ANTERIOR APPROACH HEMI HIP ARTHROPLASTY BIPOLAR;  Surgeon: [name], MD;  Location: [facility];  Service: Orthopedics;  Laterality: Right;  . COLONOSCOPY  2011   Dr Sharlett Iles    Social History:  reports that she has never smoked. She has never used smokeless tobacco. She reports that she does not drink alcohol or use drugs.  Allergies  Allergen Reactions  . Amitid [Amitriptyline Hcl]     Patient experienced a dizzy, spinning  feeling   . Metronidazole     REACTION: throat swells  . Penicillins     REACTION: rash & fever  . Alendronate Sodium     REACTION: unspecified  . Clarithromycin     GI intolerance  . Flonase [Fluticasone Propionate]     Slight Headache   . Promethazine Hcl     REACTION: unspecified  . Robitussin (Alcohol Free)  [Guaifenesin] Other (See Comments)    unknown hyperactive  . Spironolactone     09/21/13 weakness in legs  . Singulair [Montelukast Sodium]     "spacey"; ineffective    Family History  Problem Relation Age of Onset  . Diabetes Father        Pancreatitic insufficiency post MVA  . Hypertension Mother   . Osteoporosis Mother   . Heart disease Mother        AF  . Thyroid disease Sister        hypothyroidism  . Coronary artery disease Sister        AF  . Stroke Sister 1       also pulmonary disease     Prior to Admission medications   Medication Sig Start Date End Date Taking? Authorizing Provider  acetaminophen (TYLENOL) 500 MG tablet Take 1,000 mg by mouth every 6 (six) hours as needed for moderate pain.   Yes [provider]  amLODipine (NORVASC) 5 MG tablet TAKE 1 TABLET BY MOUTH EVERY DAY Patient taking differently: Take 5 mg by mouth every evening.  12/29/18  Yes Nafziger, [name], NP  Cholecalciferol (VITAMIN D3) 1000 UNITS CAPS Take 1 capsule by mouth daily.    Yes [provider]  docusate sodium (COLACE) 100 MG capsule Take 1 capsule (100 mg total) by mouth 2 (two) times daily. Patient taking differently: Take 100 mg by mouth daily as needed for mild constipation.  09/08/19  Yes Sheikh, [place], DO  feeding supplement, ENSURE ENLIVE, (ENSURE ENLIVE) LIQD Take 237 mLs by mouth 2 (two) times daily between meals. Patient taking differently: Take 237 mLs by mouth every other day.  09/08/19  Yes Sheikh, Omair Latif, DO  fexofenadine (ALLEGRA) 180 MG tablet Take 180 mg by mouth daily as needed for allergies.    Yes [provider]  metoprolol succinate (TOPROL-XL) 25 MG 24 hr tablet TAKE 1 TABLET BY MOUTH EVERY DAY Patient taking differently: Take 25 mg by mouth daily.  07/08/19  Yes Nafziger, [name], NP  Multiple Vitamin (MULTIVITAMIN) tablet Take 1 tablet by mouth daily. chewable   Yes [provider]  polyethylene glycol (MIRALAX / GLYCOLAX) 17 g  packet Take 17 g by mouth 2 (two) times daily. Patient taking differently: Take 17 g by mouth daily as needed for mild constipation.  09/08/19  Yes Sheikh, Omair Latif, DO  aspirin EC 325 MG tablet Take 1 tablet (325 mg total) by mouth daily after breakfast. Take x 1 month post op to decrease risk of blood clots. Patient not taking: Reported on 10/03/2019 09/03/19   Gary Fleet, PA-C  ferrous sulfate 325 (65 FE) MG tablet Take 1 tablet (325 mg total) by mouth daily with breakfast. Patient not taking: Reported on 10/03/2019 09/09/19   Raiford Noble Latif, DO  methocarbamol (ROBAXIN) 500 MG tablet Take 1 tablet (500 mg total) by mouth every 6 (six) hours as needed for muscle spasms. Patient not taking: Reported on 10/03/2019 09/08/19   Raiford Noble Latif, DO  ondansetron (ZOFRAN) 4 MG tablet Take 1 tablet (4 mg total)  by mouth every 6 (six) hours as needed for nausea. Patient not taking: Reported on 10/03/2019 09/08/19   Kerney Elbe, DO    Physical Exam: Vitals:   10/03/19 1853 10/03/19 2022 10/03/19 2200 10/03/19 2330  BP: (!) 180/94 (!) 164/59 (!) 185/92 (!) 173/69  Pulse: 84 80 88 73  Resp: 18 (!) 25 16 (!) 23  Temp: 98.4 F (36.9 C)     TempSrc: Oral     SpO2: 98% 95% 94% 94%  Height:        Constitutional: Elderly woman laying supine in bed NAD, calm, comfortable Eyes: PERRL, lids and conjunctivae normal ENMT: Mucous membranes are moist. Posterior pharynx clear of any exudate or lesions.Normal dentition.  Neck: normal, supple, no masses. Respiratory: Breath sounds diminished at bilateral lung bases, clear to auscultation upper lung fields, normal respiratory effort. No accessory muscle use.  Cardiovascular: Regular rate and rhythm, no murmurs / rubs / gallops.  +1 edema bilateral lower extremities at the ankles and distal third of both legs. 2+ pedal pulses. Abdomen: no tenderness, no masses palpated. No hepatosplenomegaly. Bowel sounds positive.  Musculoskeletal: no clubbing /  cyanosis. No joint deformity upper and lower extremities. Good ROM, no contractures. Normal muscle tone.  Skin: no rashes, lesions, ulcers. No induration Neurologic: CN 2-12 grossly intact. Sensation intact, moving all extremities psychiatric: Normal judgment and insight. Alert and oriented x 3. Normal mood.    Labs on Admission: I have personally reviewed following labs and imaging studies  CBC: Recent Labs  Lab 10/03/19 1930  WBC 7.3  NEUTROABS 5.3  HGB 11.2*  HCT 34.9*  MCV 97.8  PLT [id number]   Basic Metabolic Panel: Recent Labs  Lab 10/03/19 1930 10/03/19 2359  NA 139  --   K 3.2*  --   CL 103  --   CO2 28  --   GLUCOSE 105*  --   BUN 16  --   CREATININE 0.34*  --   CALCIUM 9.1  --   MG  --  2.0   GFR: CrCl cannot be calculated (Unknown ideal weight.). Liver Function Tests: Recent Labs  Lab 10/03/19 1930  AST 16  ALT 21  ALKPHOS 127*  BILITOT 0.6  PROT 6.1*  ALBUMIN 3.6   No results for input(s): LIPASE, AMYLASE in the last 168 hours. No results for input(s): AMMONIA in the last 168 hours. Coagulation Profile: No results for input(s): INR, PROTIME in the last 168 hours. Cardiac Enzymes: No results for input(s): CKTOTAL, CKMB, CKMBINDEX, TROPONINI in the last 168 hours. BNP (last 3 results) No results for input(s): PROBNP in the last 8760 hours. HbA1C: No results for input(s): HGBA1C in the last 72 hours. CBG: No results for input(s): GLUCAP in the last 168 hours. Lipid Profile: No results for input(s): CHOL, HDL, LDLCALC, TRIG, CHOLHDL, LDLDIRECT in the last 72 hours. Thyroid Function Tests: No results for input(s): TSH, T4TOTAL, FREET4, T3FREE, THYROIDAB in the last 72 hours. Anemia Panel: No results for input(s): VITAMINB12, FOLATE, FERRITIN, TIBC, IRON, RETICCTPCT in the last 72 hours. Urine analysis:    Component Value Date/Time   COLORURINE STRAW (A) 10/03/2019 1858   APPEARANCEUR CLEAR 10/03/2019 1858   LABSPEC 1.006 10/03/2019 1858   PHURINE  7.0 10/03/2019 1858   GLUCOSEU NEGATIVE 10/03/2019 1858   HGBUR NEGATIVE 10/03/2019 1858   HGBUR negative 01/25/2010 [place] 10/03/2019 1858   BILIRUBINUR neg 03/19/2018 1433   KETONESUR NEGATIVE 10/03/2019 1858   PROTEINUR NEGATIVE 10/03/2019  1858   UROBILINOGEN 0.2 03/19/2018 1433   UROBILINOGEN 0.2 01/25/2010 1147   NITRITE NEGATIVE 10/03/2019 1858   LEUKOCYTESUR NEGATIVE 10/03/2019 1858    Radiological Exams on Admission: Dg Chest 2 View  Result Date: 10/03/2019 CLINICAL DATA:  Shortness of breath since right hip surgery 4 weeks ago. Bilateral lower extremity swelling. Fever. EXAM: CHEST - 2 VIEW COMPARISON:  05/05/2019 FINDINGS: Decreased inspiration. No gross change in mild enlargement of the cardiac silhouette and prominence of the pulmonary vasculature. Interval moderate-sized bilateral pleural effusions and mild bibasilar patchy and linear density. Diffuse osteopenia. IMPRESSION: 1. Interval moderate-sized bilateral pleural effusions and mild bibasilar atelectasis and possible pneumonia. 2. Stable cardiomegaly and pulmonary vascular congestion. Electronically Signed   By: Claudie Revering M.D.   On: 10/03/2019 19:26   Ct Angio Chest Pe W And/or Wo Contrast  Result Date: 10/03/2019 CLINICAL DATA:  Right hip surgery, shortness of breath EXAM: CT ANGIOGRAPHY CHEST WITH CONTRAST TECHNIQUE: Multidetector CT imaging of the chest was performed using the standard protocol during bolus administration of intravenous contrast. Multiplanar CT image reconstructions and MIPs were obtained to evaluate the vascular anatomy. CONTRAST:  177mL OMNIPAQUE IOHEXOL 350 MG/ML SOLN COMPARISON:  None. FINDINGS: Cardiovascular: Satisfactory opacification of the pulmonary arteries to the segmental level. No evidence of pulmonary embolism. Cardiomegaly. Three-vessel coronary artery calcifications. No pericardial effusion. Aortic atherosclerosis. Mediastinum/Nodes: No enlarged mediastinal, hilar, or  axillary lymph nodes. Calcified right hilar lymph nodes. Thyroid gland, trachea, and esophagus demonstrate no significant findings. Lungs/Pleura: Small to moderate bilateral pleural effusions and associated atelectasis or consolidation. Mild interlobular septal thickening (series 10, image 18). Benign calcified pulmonary nodule of the right upper lobe. Upper Abdomen: No acute abnormality. Musculoskeletal: No chest wall abnormality. No acute or significant osseous findings. Benign, trabeculated hemangioma of T6 vertebral body. Review of the MIP images confirms the above findings. IMPRESSION: 1.  Negative examination for pulmonary embolism. 2. Small to moderate bilateral pleural effusions and associated atelectasis or consolidation. Mild interlobular septal thickening (series 10, image 18). Findings are consistent with pulmonary edema. 3.  Cardiomegaly and three-vessel coronary artery calcifications. 4.  Aortic Atherosclerosis (ICD10-I70.0). Electronically Signed   By: Eddie Candle M.D.   On: 10/03/2019 21:02    EKG: Independently reviewed. Sinus rhythm, low voltage in limb leads, low voltage is new when compared to prior.  Assessment/Plan Principal Problem:   Bilateral pleural effusion Active Problems:   Essential hypertension   Hypokalemia  ASIA PSOMAS is a 85 y.o. female with medical history significant for hypertension, hyperlipidemia, and osteoporosis who is admitted with dyspnea and bilateral pleural effusions.  Bilateral pleural effusions: Patient with progressive dyspnea on exertion and new pleural effusion seen on chest x-ray and CTA which was also negative for PE.  High-sensitivity troponin I are minimally elevated and flat at 24 and 23.  BNP elevated at 525.  SARS-CoV-2 test is negative.  EKG shows low voltage without acute ischemic changes.  Question new CHF/cardiomyopathy.  No pericardial effusion seen on CT.  Clinically no evidence of bacterial pneumonia.  Will discontinue  antibiotics. -Start IV Lasix 20 mg twice daily -Monitor strict I/O's and daily weights -Obtain echocardiogram -Check TSH  Hypokalemia: Replete orally and continue to monitor with diuresis.  Magnesium is 2.0.  Hypertension: BP elevated on admission.  Resume home amlodipine and metoprolol.  S/p right hemiarthroplasty for right hip fracture on 09/03/2019: Continue PT/OT while in hospital.   DVT prophylaxis: Subcutaneous heparin Code Status: Observation Family Communication: Discussed with family friend Velna Hatchet  Ollis per patient request by phone at [phone] Disposition Plan: Pending clinical progress Consults called: None Admission status: Observation   Zada Finders MD [facility]  If 7PM-7AM, please contact night-coverage [email or web address]  10/04/2019, 12:28 AM

## 2019-10-03 NOTE — Progress Notes (Signed)
A consult was received from an ED physician for vancomycin per pharmacy dosing.  The patient's profile has been reviewed for ht/wt/allergies/indication/available labs.   A one time order has been placed for Vancomycin 1 gm .  Further antibiotics/pharmacy consults should be ordered by admitting physician if indicated.                       Thank you, Dorrene German 10/03/2019  11:32 PM

## 2019-10-04 ENCOUNTER — Telehealth: Payer: Self-pay | Admitting: Adult Health

## 2019-10-04 ENCOUNTER — Encounter (HOSPITAL_COMMUNITY): Payer: Self-pay

## 2019-10-04 ENCOUNTER — Observation Stay (HOSPITAL_COMMUNITY): Payer: MEDICARE

## 2019-10-04 ENCOUNTER — Ambulatory Visit: Payer: Self-pay | Admitting: *Deleted

## 2019-10-04 DIAGNOSIS — F418 Other specified anxiety disorders: Secondary | ICD-10-CM | POA: Diagnosis present

## 2019-10-04 DIAGNOSIS — Z881 Allergy status to other antibiotic agents status: Secondary | ICD-10-CM | POA: Diagnosis not present

## 2019-10-04 DIAGNOSIS — S72001D Fracture of unspecified part of neck of right femur, subsequent encounter for closed fracture with routine healing: Secondary | ICD-10-CM | POA: Diagnosis not present

## 2019-10-04 DIAGNOSIS — Z23 Encounter for immunization: Secondary | ICD-10-CM | POA: Diagnosis not present

## 2019-10-04 DIAGNOSIS — E876 Hypokalemia: Secondary | ICD-10-CM | POA: Diagnosis present

## 2019-10-04 DIAGNOSIS — I110 Hypertensive heart disease with heart failure: Secondary | ICD-10-CM | POA: Diagnosis present

## 2019-10-04 DIAGNOSIS — R0602 Shortness of breath: Secondary | ICD-10-CM | POA: Diagnosis not present

## 2019-10-04 DIAGNOSIS — M810 Age-related osteoporosis without current pathological fracture: Secondary | ICD-10-CM | POA: Diagnosis present

## 2019-10-04 DIAGNOSIS — Z7982 Long term (current) use of aspirin: Secondary | ICD-10-CM | POA: Diagnosis not present

## 2019-10-04 DIAGNOSIS — R011 Cardiac murmur, unspecified: Secondary | ICD-10-CM | POA: Diagnosis present

## 2019-10-04 DIAGNOSIS — J90 Pleural effusion, not elsewhere classified: Secondary | ICD-10-CM | POA: Diagnosis not present

## 2019-10-04 DIAGNOSIS — D638 Anemia in other chronic diseases classified elsewhere: Secondary | ICD-10-CM | POA: Diagnosis present

## 2019-10-04 DIAGNOSIS — Z79899 Other long term (current) drug therapy: Secondary | ICD-10-CM | POA: Diagnosis not present

## 2019-10-04 DIAGNOSIS — Z681 Body mass index (BMI) 19 or less, adult: Secondary | ICD-10-CM | POA: Diagnosis not present

## 2019-10-04 DIAGNOSIS — I5030 Unspecified diastolic (congestive) heart failure: Secondary | ICD-10-CM | POA: Diagnosis present

## 2019-10-04 DIAGNOSIS — R627 Adult failure to thrive: Secondary | ICD-10-CM | POA: Diagnosis present

## 2019-10-04 DIAGNOSIS — R05 Cough: Secondary | ICD-10-CM | POA: Diagnosis not present

## 2019-10-04 DIAGNOSIS — K219 Gastro-esophageal reflux disease without esophagitis: Secondary | ICD-10-CM | POA: Diagnosis present

## 2019-10-04 DIAGNOSIS — H35033 Hypertensive retinopathy, bilateral: Secondary | ICD-10-CM | POA: Diagnosis present

## 2019-10-04 DIAGNOSIS — Z96641 Presence of right artificial hip joint: Secondary | ICD-10-CM | POA: Diagnosis present

## 2019-10-04 DIAGNOSIS — E785 Hyperlipidemia, unspecified: Secondary | ICD-10-CM | POA: Diagnosis present

## 2019-10-04 DIAGNOSIS — I472 Ventricular tachycardia: Secondary | ICD-10-CM | POA: Diagnosis not present

## 2019-10-04 DIAGNOSIS — Z880 Allergy status to penicillin: Secondary | ICD-10-CM | POA: Diagnosis not present

## 2019-10-04 DIAGNOSIS — Z8249 Family history of ischemic heart disease and other diseases of the circulatory system: Secondary | ICD-10-CM | POA: Diagnosis not present

## 2019-10-04 DIAGNOSIS — Z8262 Family history of osteoporosis: Secondary | ICD-10-CM | POA: Diagnosis not present

## 2019-10-04 DIAGNOSIS — I361 Nonrheumatic tricuspid (valve) insufficiency: Secondary | ICD-10-CM

## 2019-10-04 DIAGNOSIS — Z66 Do not resuscitate: Secondary | ICD-10-CM | POA: Diagnosis present

## 2019-10-04 DIAGNOSIS — Z20828 Contact with and (suspected) exposure to other viral communicable diseases: Secondary | ICD-10-CM | POA: Diagnosis present

## 2019-10-04 LAB — ECHOCARDIOGRAM COMPLETE
Height: 65 in
Weight: 2042.34 oz

## 2019-10-04 LAB — BASIC METABOLIC PANEL
Anion gap: 5 (ref 5–15)
BUN: 10 mg/dL (ref 8–23)
CO2: 29 mmol/L (ref 22–32)
Calcium: 8.6 mg/dL — ABNORMAL LOW (ref 8.9–10.3)
Chloride: 105 mmol/L (ref 98–111)
Creatinine, Ser: 0.35 mg/dL — ABNORMAL LOW (ref 0.44–1.00)
GFR calc Af Amer: >60 mL/min (ref >60)
GFR calc non Af Amer: >60 mL/min (ref >60)
Glucose, Bld: 116 mg/dL — ABNORMAL HIGH (ref 70–99)
Potassium: 3.6 mmol/L (ref 3.5–5.1)
Sodium: 139 mmol/L (ref 135–145)

## 2019-10-04 LAB — CBC
HCT: 30.2 % — ABNORMAL LOW (ref 36.0–46.0)
Hemoglobin: 9.7 g/dL — ABNORMAL LOW (ref 12.0–15.0)
MCH: 31.1 pg (ref 26.0–34.0)
MCHC: 32.1 g/dL (ref 30.0–36.0)
MCV: 96.8 fL (ref 80.0–100.0)
Platelets: 268 10*3/uL (ref 150–400)
RBC: 3.12 MIL/uL — ABNORMAL LOW (ref 3.87–5.11)
RDW: 13.2 % (ref 11.5–15.5)
WBC: 6 10*3/uL (ref 4.0–10.5)
nRBC: 0 % (ref 0.0–0.2)

## 2019-10-04 LAB — PROCALCITONIN: Procalcitonin: <0.1 ng/mL

## 2019-10-04 LAB — MAGNESIUM
Magnesium: 1.9 mg/dL (ref 1.7–2.4)
Magnesium: 2 mg/dL (ref 1.7–2.4)

## 2019-10-04 LAB — TSH: TSH: 2.526 u[IU]/mL (ref 0.350–4.500)

## 2019-10-04 MED ORDER — POTASSIUM CHLORIDE CRYS ER 20 MEQ PO TBCR
20.0000 meq | EXTENDED_RELEASE_TABLET | Freq: Once | ORAL | Status: AC
  Administered 2019-10-04: 10:00:00 20 meq via ORAL
  Filled 2019-10-04: qty 1

## 2019-10-04 MED ORDER — POTASSIUM CHLORIDE 20 MEQ/15ML (10%) PO SOLN
60.0000 meq | Freq: Once | ORAL | Status: AC
  Administered 2019-10-04: 60 meq via ORAL
  Filled 2019-10-04: qty 45

## 2019-10-04 MED ORDER — FUROSEMIDE 10 MG/ML IJ SOLN
20.0000 mg | Freq: Two times a day (BID) | INTRAMUSCULAR | Status: DC
  Administered 2019-10-04 – 2019-10-05 (×4): 20 mg via INTRAVENOUS
  Filled 2019-10-04 (×4): qty 2

## 2019-10-04 MED ORDER — AMLODIPINE BESYLATE 5 MG PO TABS
5.0000 mg | ORAL_TABLET | Freq: Every evening | ORAL | Status: DC
  Administered 2019-10-04: 5 mg via ORAL
  Filled 2019-10-04: qty 1

## 2019-10-04 MED ORDER — SODIUM CHLORIDE 0.9% FLUSH
3.0000 mL | Freq: Two times a day (BID) | INTRAVENOUS | Status: DC
  Administered 2019-10-04 – 2019-10-07 (×8): 3 mL via INTRAVENOUS

## 2019-10-04 MED ORDER — METOPROLOL SUCCINATE ER 25 MG PO TB24
25.0000 mg | ORAL_TABLET | Freq: Every day | ORAL | Status: DC
  Administered 2019-10-04 – 2019-10-05 (×2): 25 mg via ORAL
  Filled 2019-10-04 (×2): qty 1

## 2019-10-04 MED ORDER — ACETAMINOPHEN 325 MG PO TABS
650.0000 mg | ORAL_TABLET | Freq: Four times a day (QID) | ORAL | Status: DC | PRN
  Administered 2019-10-04 – 2019-10-06 (×4): 650 mg via ORAL
  Filled 2019-10-04 (×4): qty 2

## 2019-10-04 MED ORDER — HEPARIN SODIUM (PORCINE) 5000 UNIT/ML IJ SOLN
5000.0000 [IU] | Freq: Three times a day (TID) | INTRAMUSCULAR | Status: DC
  Administered 2019-10-04 – 2019-10-07 (×10): 5000 [IU] via SUBCUTANEOUS
  Filled 2019-10-04 (×10): qty 1

## 2019-10-04 MED ORDER — ACETAMINOPHEN 650 MG RE SUPP: 650.0000 mg | Freq: Four times a day (QID) | RECTAL | Status: DC | PRN

## 2019-10-04 MED ORDER — INFLUENZA VAC A&B SA ADJ QUAD 0.5 ML IM PRSY
0.5000 mL | PREFILLED_SYRINGE | INTRAMUSCULAR | Status: AC
  Administered 2019-10-05: 0.5 mL via INTRAMUSCULAR
  Filled 2019-10-04: qty 0.5

## 2019-10-04 MED ORDER — ENSURE ENLIVE PO LIQD
237.0000 mL | Freq: Two times a day (BID) | ORAL | Status: DC
  Administered 2019-10-04 – 2019-10-07 (×6): 237 mL via ORAL

## 2019-10-04 NOTE — Progress Notes (Signed)
PROGRESS NOTE    Alicia Boyd  [id number] DOB: 07-15-1930 DOA: 10/03/2019 PCP: [name], NP   Brief Narrative: As per H&P:  85 y.o. female with medical history significant for hypertension, hyperlipidemia, and osteoporosis who presents to the ED for evaluation of shortness of breath and swelling in her legs. recent hospitalization 09/03/2019-09/09/2019 for a right hip fracture  with right hemiarthroplasty  (9/4)- discharged on aspirin 325 mg twice daily for 1 month for VTE prophylaxis to SNF.From SNF was discharged home where she lives alone  She noticed dyspnea on exertion when working with her therapist at the skilled nursing facility.  She has had continued shortness of breath with exertional activity and has noticed some swelling in both of her lower legs.  She reported having occasional palpitations and chest discomfort.  She has had infrequent cough without sputum production.  She denies any subjective fevers, chills, or diaphoresis.  She has had increased urinary frequency without dysuria.  She denies any abdominal pain, diarrhea, or constipation.    She has been ambulating with the use of a walker at home but activity is limited due to her dyspnea.  Her symptoms worsened yesterday and she presented to the ED for further evaluation after discussing with her primary care provider.  ED Course: BP on higher side-180/94,SPO2 98% on room air. Labs stable except for potassium 3.2, bicarb 28,BNP 525.2, high-sensitivity troponin I 24 and 23.  Urinalysis was negative for UTI. SARS-CoV-2 test is negative.  Blood cultures were ordered.CXR -new moderate sized bilateral pleural effusions and mild bibasilar atelectasis with stable cardiomegaly and pulmonary vascular congestion. CTA chest PE study was negative for pulmonary embolism and showed small to moderate bilateral pleural effusions, cardiomegaly and three-vessel coronary artery calcifications were noted. Patient was ordered to receive IV  vancomycin and aztreonam and the hospitalist service was consulted to admit for further evaluation and management. Patient antibiotics were discontinued on admission and was placed on IV Lasix  Subjective: Denies chest pain or cough. Sob is bette last bm yesteday On RA.Legs still swollen. Legs were twice as swollen yesterday. Does not feels well enough today Waiting ot work with PT/OT She lives home alone, neighbor checks on her, she has aid come to home.  Assessment & Plan:   Bilateral pleural effusion with progressive dyspnea on exertion: No PE on CTA, BNP elevated at 525 COVID-19 negative Suspecting due to fluid overload/ diastolic chf-echo pending.  Patient overall feels improving on diuresis, continue IV Lasix. Monitor intake output and daily weight.  Echo in 2013 showed normal EF but grade 1 diastolic dysfunction.  Repeat chest x-ray in the morning chest x-ray today still shows pleural effusion.  If patient has persistent pleural effusion can attempt on IR thoracentesis in a day or 2. She is on RA and not hypoxic.  Essential hypertension: Blood pressure poorly controlled [id number] to [id number] systolic.  Continue amlodipine, metoprolol, IV Lasix monitor and adjust meds.  Hypokalemia: Potassium improved 3.6, monitor while on diuresis.  Nonsustained V. Tach: monitor electrolytes, keep above 4 add potassium chloride. Mag at 1.902. Monitor in tele.  Continue her beta-blocker.  Positive troponin, flat at 24-23, no chest pain likely in the setting of CHF. F/u TTE  Anemia NOS- likely from chronic disease hemoglobin at 9.7 g.  Monitor. Status post right hemiarthroplasty on 9/4, continue PT OT.  Body mass index is 21.24 kg/m.    DVT prophylaxis:Heparin. Code Status:DNR. Family Communication:plan of care discussed with patient in detail. Cook [name]-friend/contact person per,  has no family in [place], lives by herself, they are concerned about her going home. She was at [facility] at rehab  but was looking wonderful after she came back but has been going downhill- they feel "she needs more that they can give her" and almost fell at home. She has nephew who is 65-lives in [place] and has older brother/sister in [place].   Disposition Plan: Remains inpatient pending further improvement in the pleural effusion, leg edema, continues to need IV diuresis and will need to repeat chest x-ray after diuresis and if does not improve will likely need thoracentesis. Patient lives alone with poor support at home, had recent hip fracture from fall. Almost fell at home past few days- was caught by PT/Aid at home. Will obtain PT OT to follow for dispo recommendations. At risk for readmission, and likely unsafe at home.  I had lengthy conversation with patient's friend see above.  Will consult social worker for possible placement..  Consultants: none  Procedures: Cx 10/04/19 r:"Small to moderate pleural effusions have increased since the most recent examination"  Microbiology:  Antimicrobials: Anti-infectives (From admission, onward)   Start     Dose/Rate Route Frequency Ordered Stop   10/03/19 2345  vancomycin (VANCOCIN) IVPB 1000 mg/200 mL premix  Status:  Discontinued     1,000 mg 200 mL/hr over 60 Minutes Intravenous  Once 10/03/19 2330 10/04/19 0016   10/03/19 2330  aztreonam (AZACTAM) 2 g in sodium chloride 0.9 % 100 mL IVPB  Status:  Discontinued     2 g 200 mL/hr over 30 Minutes Intravenous  Once 10/03/19 2321 10/04/19 0016       Objective: Vitals:   10/04/19 0105 10/04/19 0139 10/04/19 0439 10/04/19 0745  BP: (!) 175/68  (!) 162/74   Pulse: 76  88   Resp: 18  16   Temp: 98.4 F (36.9 C)  98.7 F (37.1 C)   TempSrc: Oral  Oral   SpO2: 94%  95%   Weight:  55.6 kg  57.9 kg  Height:  5\' 5"  (1.651 m)      Intake/Output Summary (Last 24 hours) at 10/04/2019 0813 Last data filed at 10/04/2019 0438 Gross per 24 hour  Intake 240 ml  Output 2020 ml  Net -1780 ml   Filed  Weights   10/04/19 0139 10/04/19 0745  Weight: 55.6 kg 57.9 kg   Weight change:   Body mass index is 21.24 kg/m.  Intake/Output from previous day: 10/04 0701 - 10/05 0700 In: 240 [P.O.:240] Out: 2020 [Urine:2020] Intake/Output this shift: No intake/output data recorded.  Examination:  General exam: Appears calm and comfortable, elderly, frail ,  HEENT:PERRL,Oral mucosa moist, Ear/Nose normal on gross exam Respiratory system: Bilateral diminished BS on lung base, no wheezes or crackles  Cardiovascular system: S1 & S2 heard,No JVD, murmurs. Gastrointestinal system: Abdomen is  soft, non tender, non distended, BS +  Nervous System:Alert and oriented. No focal neurological deficits/moving extremities, sensation intact. Extremities: b/l leg edema- better from prior, no clubbing, distal peripheral pulses palpable. Skin: No rashes, lesions, no icterus MSK: Normal muscle bulk,tone ,power  Medications:  Scheduled Meds: . amLODipine  5 mg Oral QPM  . feeding supplement (ENSURE ENLIVE)  237 mL Oral BID BM  . furosemide  20 mg Intravenous Q12H  . heparin  5,000 Units Subcutaneous Q8H  . [START ON 10/05/2019] influenza vaccine adjuvanted  0.5 mL Intramuscular Tomorrow-1000  . metoprolol succinate  25 mg Oral Daily  . potassium chloride  20 mEq  Oral Once  . sodium chloride flush  3 mL Intravenous Q12H   Continuous Infusions:  Data Reviewed: I have personally reviewed following labs and imaging studies  CBC: Recent Labs  Lab 10/03/19 1930 10/04/19 0511  WBC 7.3 6.0  NEUTROABS 5.3  --   HGB 11.2* 9.7*  HCT 34.9* 30.2*  MCV 97.8 96.8  PLT 297 [id number]   Basic Metabolic Panel: Recent Labs  Lab 10/03/19 1930 10/03/19 2359 10/04/19 0511  NA 139  --  139  K 3.2*  --  3.6  CL 103  --  105  CO2 28  --  29  GLUCOSE 105*  --  116*  BUN 16  --  10  CREATININE 0.34*  --  0.35*  CALCIUM 9.1  --  8.6*  MG  --  2.0 1.9   GFR: Estimated Creatinine Clearance: 45.4 mL/min (A) (by C-G  formula based on SCr of 0.35 mg/dL (L)). Liver Function Tests: Recent Labs  Lab 10/03/19 1930  AST 16  ALT 21  ALKPHOS 127*  BILITOT 0.6  PROT 6.1*  ALBUMIN 3.6   No results for input(s): LIPASE, AMYLASE in the last 168 hours. No results for input(s): AMMONIA in the last 168 hours. Coagulation Profile: No results for input(s): INR, PROTIME in the last 168 hours. Cardiac Enzymes: No results for input(s): CKTOTAL, CKMB, CKMBINDEX, TROPONINI in the last 168 hours. BNP (last 3 results) No results for input(s): PROBNP in the last 8760 hours. HbA1C: No results for input(s): HGBA1C in the last 72 hours. CBG: No results for input(s): GLUCAP in the last 168 hours. Lipid Profile: No results for input(s): CHOL, HDL, LDLCALC, TRIG, CHOLHDL, LDLDIRECT in the last 72 hours. Thyroid Function Tests: Recent Labs    10/03/19 2344  TSH 2.526   Anemia Panel: No results for input(s): VITAMINB12, FOLATE, FERRITIN, TIBC, IRON, RETICCTPCT in the last 72 hours. Sepsis Labs: Recent Labs  Lab 10/03/19 2351  PROCALCITON <0.10    Recent Results (from the past 240 hour(s))  SARS Coronavirus 2 [facility] order, Performed in [facility] hospital lab) Nasopharyngeal Nasopharyngeal Swab     Status: None   Collection Time: 10/03/19  7:34 PM   Specimen: Nasopharyngeal Swab  Result Value Ref Range Status   SARS Coronavirus 2 NEGATIVE NEGATIVE Final    Comment: (NOTE) If result is NEGATIVE SARS-CoV-2 target nucleic acids are NOT DETECTED. The SARS-CoV-2 RNA is generally detectable in upper and lower  respiratory specimens during the acute phase of infection. The lowest  concentration of SARS-CoV-2 viral copies this assay can detect is 250  copies / mL. A negative result does not preclude SARS-CoV-2 infection  and should not be used as the sole basis for treatment or other  patient management decisions.  A negative result may occur with  improper specimen collection / handling, submission of specimen  other  than nasopharyngeal swab, presence of viral mutation(s) within the  areas targeted by this assay, and inadequate number of viral copies  (<250 copies / mL). A negative result must be combined with clinical  observations, patient history, and epidemiological information. If result is POSITIVE SARS-CoV-2 target nucleic acids are DETECTED. The SARS-CoV-2 RNA is generally detectable in upper and lower  respiratory specimens dur ing the acute phase of infection.  Positive  results are indicative of active infection with SARS-CoV-2.  Clinical  correlation with patient history and other diagnostic information is  necessary to determine patient infection status.  Positive results do  not rule  out bacterial infection or co-infection with other viruses. If result is PRESUMPTIVE POSTIVE SARS-CoV-2 nucleic acids MAY BE PRESENT.   A presumptive positive result was obtained on the submitted specimen  and confirmed on repeat testing.  While 2019 novel coronavirus  (SARS-CoV-2) nucleic acids may be present in the submitted sample  additional confirmatory testing may be necessary for epidemiological  and / or clinical management purposes  to differentiate between  SARS-CoV-2 and other Sarbecovirus currently known to infect humans.  If clinically indicated additional testing with an alternate test  methodology [phone]) is advised. The SARS-CoV-2 RNA is generally  detectable in upper and lower respiratory sp ecimens during the acute  phase of infection. The expected result is Negative. Fact Sheet for Patients:  StrictlyIdeas.no Fact Sheet for Healthcare Providers: BankingDealers.co.za This test is not yet approved or cleared by the [place] FDA and has been authorized for detection and/or diagnosis of SARS-CoV-2 by FDA under an Emergency Use Authorization (EUA).  This EUA will remain in effect (meaning this test can be used) for the duration  of the COVID-19 declaration under Section 564(b)(1) of the Act, 21 U.S.C. section 360bbb-3(b)(1), unless the authorization is terminated or revoked sooner. Performed at [facility], [place] [address] [place], [place] [address]   Blood culture (routine x 2)     Status: None (Preliminary result)   Collection Time: 10/03/19 11:51 PM   Specimen: BLOOD  Result Value Ref Range Status   Specimen Description   Final    BLOOD LEFT ANTECUBITAL Performed at [facility] [address] [place], [place] [address]    Special Requests   Final    BOTTLES DRAWN AEROBIC AND ANAEROBIC Blood Culture adequate volume Performed at [facility] [address] [place], [place] [address]    Culture   Final    NO GROWTH < 12 HOURS Performed at [facility] [address] [place], [place] [address]    Report Status PENDING  Incomplete  Blood culture (routine x 2)     Status: None (Preliminary result)   Collection Time: 10/03/19 11:51 PM   Specimen: BLOOD  Result Value Ref Range Status   Specimen Description   Final    BLOOD RIGHT ANTECUBITAL Performed at [facility] [address], [place], [place] [address]    Special Requests   Final    BOTTLES DRAWN AEROBIC AND ANAEROBIC Blood Culture adequate volume Performed at [facility] [address], [place], [place] [address]    Culture   Final    NO GROWTH < 12 HOURS Performed at [facility] [address], [place],  [address]    Report Status PENDING  Incomplete      Radiology Studies: Dg Chest 2 View  Result Date: 10/03/2019 CLINICAL DATA:  Shortness of breath since right hip surgery 4 weeks ago. Bilateral lower extremity swelling. Fever. EXAM: CHEST - 2 VIEW COMPARISON:  05/05/2019 FINDINGS: Decreased inspiration. No gross change in mild enlargement of the cardiac silhouette and prominence of the pulmonary vasculature.  Interval moderate-sized bilateral pleural effusions and mild bibasilar patchy and linear density. Diffuse osteopenia. IMPRESSION: 1. Interval moderate-sized bilateral pleural effusions and mild bibasilar atelectasis and possible pneumonia. 2. Stable cardiomegaly and pulmonary vascular congestion. Electronically Signed   By: Claudie Revering M.D.   On: 10/03/2019 19:26   Ct Angio Chest Pe W And/or Wo Contrast  Result Date: 10/03/2019 CLINICAL DATA:  Right  hip surgery, shortness of breath EXAM: CT ANGIOGRAPHY CHEST WITH CONTRAST TECHNIQUE: Multidetector CT imaging of the chest was performed using the standard protocol during bolus administration of intravenous contrast. Multiplanar CT image reconstructions and MIPs were obtained to evaluate the vascular anatomy. CONTRAST:  120mL OMNIPAQUE IOHEXOL 350 MG/ML SOLN COMPARISON:  None. FINDINGS: Cardiovascular: Satisfactory opacification of the pulmonary arteries to the segmental level. No evidence of pulmonary embolism. Cardiomegaly. Three-vessel coronary artery calcifications. No pericardial effusion. Aortic atherosclerosis. Mediastinum/Nodes: No enlarged mediastinal, hilar, or axillary lymph nodes. Calcified right hilar lymph nodes. Thyroid gland, trachea, and esophagus demonstrate no significant findings. Lungs/Pleura: Small to moderate bilateral pleural effusions and associated atelectasis or consolidation. Mild interlobular septal thickening (series 10, image 18). Benign calcified pulmonary nodule of the right upper lobe. Upper Abdomen: No acute abnormality. Musculoskeletal: No chest wall abnormality. No acute or significant osseous findings. Benign, trabeculated hemangioma of T6 vertebral body. Review of the MIP images confirms the above findings. IMPRESSION: 1.  Negative examination for pulmonary embolism. 2. Small to moderate bilateral pleural effusions and associated atelectasis or consolidation. Mild interlobular septal thickening (series 10, image 18). Findings  are consistent with pulmonary edema. 3.  Cardiomegaly and three-vessel coronary artery calcifications. 4.  Aortic Atherosclerosis (ICD10-I70.0). Electronically Signed   By: Eddie Candle M.D.   On: 10/03/2019 21:02      LOS: 0 days   Time spent: More than 50% of that time was spent in counseling and/or coordination of care.  Antonieta Pert, MD [facility]  10/04/2019, 8:13 AM

## 2019-10-04 NOTE — Evaluation (Signed)
Occupational Therapy Evaluation Patient Details Name: Alicia Boyd MRN: [id number] DOB: 22-Mar-1930 Today's Date: 10/04/2019    History of Present Illness 85 yo female admitted wtih bil pleural effusion. Hx of R hip hemi-anterior 09/03/19, falls, pelvic fracture, osteoporosis   Clinical Impression   Pt admitted with Bilateral pleural effusion. Pt currently with functional limitations due to the deficits listed below (see OT Problem List).  Pt will benefit from skilled OT to increase their safety and independence with ADL and functional mobility for ADL to facilitate discharge to venue listed below.   OT spoke with pt for need for A with ADL activity for safety. Pt agreed.  [facility] point.         Follow Up Recommendations  Home health OT;Supervision/Assistance - 24 hour    Equipment Recommendations  None recommended by OT    Recommendations for Other Services       Precautions / Restrictions Precautions Precautions: Fall Restrictions Weight Bearing Restrictions: No      Mobility Bed Mobility Overal bed mobility: Needs Assistance Bed Mobility: Supine to Sit     Supine to sit: Min guard;HOB elevated     General bed mobility comments: increased time. min guard for safety.  Transfers Overall transfer level: Needs assistance Equipment used: Rolling walker (2 wheeled) Transfers: Sit to/from [facility] Sit to Stand: Min guard Stand pivot transfers: Min guard       General transfer comment: increased time. cues for safety, hand placement.    Balance Overall balance assessment: Needs assistance;History of Falls         Standing balance support: Bilateral upper extremity supported Standing balance-Leahy Scale: Poor                             ADL either performed or assessed with clinical judgement   ADL Overall ADL's : Needs assistance/impaired Eating/Feeding: Independent;Sitting   Grooming:  Set up;Sitting   Upper Body Bathing: Set up;Sitting   Lower Body Bathing: Minimal assistance;Sit to/from stand;Cueing for safety;Cueing for sequencing   Upper Body Dressing : Set up;Sitting   Lower Body Dressing: Minimal assistance;Sit to/from stand;Cueing for safety;Cueing for sequencing   Toilet Transfer: Minimal assistance;RW;Ambulation   Toileting- Clothing Manipulation and Hygiene: Minimal assistance;Sit to/from stand;Cueing for safety;Cueing for compensatory techniques;Cueing for sequencing       Functional mobility during ADLs: Minimal assistance;Rolling walker       Vision Patient Visual Report: No change from baseline              Pertinent Vitals/Pain Pain Assessment: 0-10 Pain Score: 2  Faces Pain Scale: Hurts little more Pain Location: L axilla area Pain Descriptors / Indicators: Sore Pain Intervention(s): Limited activity within patient's tolerance     Hand Dominance     Extremity/Trunk Assessment Upper Extremity Assessment Upper Extremity Assessment: Generalized weakness   Lower Extremity Assessment Lower Extremity Assessment: Generalized weakness   Cervical / Trunk Assessment Cervical / Trunk Assessment: Kyphotic   Communication Communication Communication: No difficulties   Cognition Arousal/Alertness: Awake/alert Behavior During Therapy: WFL for tasks assessed/performed Overall Cognitive Status: Within Functional Limits for tasks assessed                                                Home Living Family/patient expects  to be discharged to:: Private residence Living Arrangements: Alone Available Help at Discharge: Personal care attendant([employer] at night 10 pm-7) Type of Home: (townhome) Home Access: Stairs to enter [employer] of Steps: 1 curb step then 1 threshold   Home Layout: Two level;Able to live on main level with bedroom/bathroom     Bathroom Shower/Tub: Tub/shower unit;Walk-in shower          Home Equipment: Walker - 2 wheels          Prior Functioning/Environment Level of Independence: Independent with assistive device(s)        Comments: recently left SNF rehab. Pt was about to start home health PT before coming into hospital        OT Problem List: Decreased strength;Decreased activity tolerance;Impaired balance (sitting and/or standing)      OT Treatment/Interventions: Self-care/ADL training;Patient/family education;Therapeutic activities    OT Goals(Current goals can be found in the care plan section) Acute Rehab OT Goals Patient Stated Goal: to get better/stronger OT Goal Formulation: With patient Time For Goal Achievement: 10/11/19 ADL Goals Pt Will Perform Grooming: with modified independence;standing Pt Will Perform Lower Body Bathing: with modified independence;sit to/from stand Pt Will Perform Lower Body Dressing: with modified independence;sit to/from stand Pt Will Transfer to Toilet: with modified independence;regular height toilet Pt Will Perform Toileting - Clothing Manipulation and hygiene: with modified independence;sit to/from stand  OT Frequency: Min 2X/week    AM-PAC OT "6 Clicks" Daily Activity     Outcome Measure Help from another person eating meals?: A Little Help from another person taking care of personal grooming?: None Help from another person toileting, which includes using toliet, bedpan, or urinal?: A Little Help from another person bathing (including washing, rinsing, drying)?: A Little Help from another person to put on and taking off regular upper body clothing?: A Little Help from another person to put on and taking off regular lower body clothing?: A Little 6 Click Score: 19   End of Session Equipment Utilized During Treatment: Rolling walker Nurse Communication: Mobility status  Activity Tolerance: Patient tolerated treatment well Patient left: in chair;with call bell/phone within reach  OT Visit Diagnosis:  Unsteadiness on feet (R26.81);Repeated falls (R29.6);Muscle weakness (generalized) (M62.81)                Time: [id number] OT Time Calculation (min): 21 min Charges:  OT General Charges $OT Visit: 1 Visit OT Evaluation $OT Eval Moderate Complexity: 1 Mod  Kari Baars, [place] [phone] Office- [phone], Edwena Felty D 10/04/2019, 6:31 PM

## 2019-10-04 NOTE — Telephone Encounter (Signed)
L[facility] with [facility] is calling in for VO   Frequency: 2 week 6 and then and 1 week 2 -- pt had eval last week(9 /28).  They are also requesting the okay for OT and home health aide   CB: [phone]    ALSO, She also says that pt was discharged from rehab and is needing a follow up apt with PCP.   Please call pt to assist with scheduling.

## 2019-10-04 NOTE — ED Notes (Signed)
ED TO INPATIENT HANDOFF REPORT  Name/Age/Gender Alicia Boyd 85 y.o. female  Code Status    Code Status Orders  (From admission, onward)         Start     Ordered   10/04/19 0010  Do not attempt resuscitation (DNR)  Continuous    Question Answer Comment  In the event of cardiac or respiratory ARREST Do not call a "code blue"   In the event of cardiac or respiratory ARREST Do not perform Intubation, CPR, defibrillation or ACLS   In the event of cardiac or respiratory ARREST Use medication by any route, position, wound care, and other measures to relive pain and suffering. May use oxygen, suction and manual treatment of airway obstruction as needed for comfort.      10/04/19 0010        Code Status History    Date Active Date Inactive Code Status Order ID Comments User Context   09/03/2019 1716 09/09/2019 1500 DNR [id number]  Mercy Riding, MD ED   Advance Care Planning Activity      Home/SNF/Other Home  Chief Complaint SOB  Level of Care/Admitting Diagnosis ED Disposition    ED Disposition Condition [facility] Area: [facility] [100102]  Level of Care: Telemetry [5]  Admit to tele based on following criteria: Acute CHF  Covid Evaluation: Confirmed COVID Negative  Diagnosis: Bilateral pleural effusion [id number]  Admitting Physician: Lenore Cordia [id number]  Attending Physician: Lenore Cordia [id number]  PT Class (Do Not Modify): Observation [104]  PT Acc Code (Do Not Modify): Observation [10022]       Medical History Past Medical History:  Diagnosis Date  . Aortic heart murmur    AS/AI on 2D ECHO ; SBE prophylaxis Rxed  . Chest pain 2007 ; 2011   negative cardiac assessment  . Hyperlipidemia   . Interstitial cystitis    Resolved  . Ocular migraine 04/10/2012  . Osteoporosis    T score - 3.0 @ femoral neck; Fosamax affected swallowing  . Postmenopausal    No PMH or HRT    Allergies Allergies  Allergen Reactions   . Amitid [Amitriptyline Hcl]     Patient experienced a dizzy, spinning feeling   . Metronidazole     REACTION: throat swells  . Penicillins     REACTION: rash & fever  . Alendronate Sodium     REACTION: unspecified  . Clarithromycin     GI intolerance  . Flonase [Fluticasone Propionate]     Slight Headache   . Promethazine Hcl     REACTION: unspecified  . Robitussin (Alcohol Free) [Guaifenesin] Other (See Comments)    unknown hyperactive  . Spironolactone     09/21/13 weakness in legs  . Singulair [Montelukast Sodium]     "spacey"; ineffective    IV Location/Drains/Wounds Patient Lines/Drains/Airways Status   Active Line/Drains/Airways    Name:   Placement date:   Placement time:   Site:   Days:   Peripheral IV 10/03/19 Left Antecubital   10/03/19    1932    Antecubital   1   External Urinary Catheter   10/03/19    2020    -   1   Incision (Closed) 09/03/19 Thigh Right   09/03/19    2027     31          Labs/Imaging Results for orders placed or performed during the hospital encounter of 10/03/19 (from the past 48  hour(s))  Urinalysis, Routine w reflex microscopic     Status: Abnormal   Collection Time: 10/03/19  6:58 PM  Result Value Ref Range   Color, Urine STRAW (A) YELLOW   APPearance CLEAR CLEAR   Specific Gravity, Urine 1.006 1.005 - 1.030   pH 7.0 5.0 - 8.0   Glucose, UA NEGATIVE NEGATIVE mg/dL   Hgb urine dipstick NEGATIVE NEGATIVE   Bilirubin Urine NEGATIVE NEGATIVE   Ketones, ur NEGATIVE NEGATIVE mg/dL   Protein, ur NEGATIVE NEGATIVE mg/dL   Nitrite NEGATIVE NEGATIVE   Leukocytes,Ua NEGATIVE NEGATIVE    Comment: Performed at [facility] [address] [place], [place] [address]  Troponin I (High Sensitivity)     Status: Abnormal   Collection Time: 10/03/19  7:28 PM  Result Value Ref Range   Troponin I (High Sensitivity) 24 (H) <18 ng/L    Comment: (NOTE) Elevated high sensitivity troponin I (hsTnI) values and significant   changes across serial measurements may suggest ACS but many other  chronic and acute conditions are known to elevate hsTnI results.  Refer to the "Links" section for chest pain algorithms and additional  guidance. Performed at [facility], [place] [address], [place], [place] [address]   Brain natriuretic peptide     Status: Abnormal   Collection Time: 10/03/19  7:28 PM  Result Value Ref Range   B Natriuretic Peptide 525.2 (H) 0.0 - 100.0 pg/mL    Comment: Performed at [facility], [place] [address], [place], [place] [address]  Comprehensive metabolic panel     Status: Abnormal   Collection Time: 10/03/19  7:30 PM  Result Value Ref Range   Sodium 139 135 - 145 mmol/L   Potassium 3.2 (L) 3.5 - 5.1 mmol/L   Chloride 103 98 - 111 mmol/L   CO2 28 22 - 32 mmol/L   Glucose, Bld 105 (H) 70 - 99 mg/dL   BUN 16 8 - 23 mg/dL   Creatinine, Ser 0.34 (L) 0.44 - 1.00 mg/dL   Calcium 9.1 8.9 - 10.3 mg/dL   Total Protein 6.1 (L) 6.5 - 8.1 g/dL   Albumin 3.6 3.5 - 5.0 g/dL   AST 16 15 - 41 U/L   ALT 21 0 - 44 U/L   Alkaline Phosphatase 127 (H) 38 - 126 U/L   Total Bilirubin 0.6 0.3 - 1.2 mg/dL   GFR calc non Af Amer >60 >60 mL/min   GFR calc Af Amer >60 >60 mL/min   Anion gap 8 5 - 15    Comment: Performed at [facility], [place] [address], [place], [place] [address]  CBC with Differential     Status: Abnormal   Collection Time: 10/03/19  7:30 PM  Result Value Ref Range   WBC 7.3 4.0 - 10.5 K/uL   RBC 3.57 (L) 3.87 - 5.11 MIL/uL   Hemoglobin 11.2 (L) 12.0 - 15.0 g/dL   HCT 34.9 (L) 36.0 - 46.0 %   MCV 97.8 80.0 - 100.0 fL   MCH 31.4 26.0 - 34.0 pg   MCHC 32.1 30.0 - 36.0 g/dL   RDW 13.1 11.5 - 15.5 %   Platelets 297 150 - 400 K/uL   nRBC 0.0 0.0 - 0.2 %   Neutrophils Relative % 73 %   Neutro Abs 5.3 1.7 - 7.7 K/uL   Lymphocytes Relative 17 %   Lymphs Abs 1.3 0.7 - 4.0 K/uL   Monocytes Relative 7 %   Monocytes  Absolute 0.5 0.1 - 1.0  K/uL   Eosinophils Relative 2 %   Eosinophils Absolute 0.1 0.0 - 0.5 K/uL   Basophils Relative 1 %   Basophils Absolute 0.1 0.0 - 0.1 K/uL   Immature Granulocytes 0 %   Abs Immature Granulocytes 0.02 0.00 - 0.07 K/uL    Comment: Performed at [facility], [place] [address], [place], [place] [address]  SARS Coronavirus 2 [facility] order, Performed in [facility] hospital lab) Nasopharyngeal Nasopharyngeal Swab     Status: None   Collection Time: 10/03/19  7:34 PM   Specimen: Nasopharyngeal Swab  Result Value Ref Range   SARS Coronavirus 2 NEGATIVE NEGATIVE    Comment: (NOTE) If result is NEGATIVE SARS-CoV-2 target nucleic acids are NOT DETECTED. The SARS-CoV-2 RNA is generally detectable in upper and lower  respiratory specimens during the acute phase of infection. The lowest  concentration of SARS-CoV-2 viral copies this assay can detect is 250  copies / mL. A negative result does not preclude SARS-CoV-2 infection  and should not be used as the sole basis for treatment or other  patient management decisions.  A negative result may occur with  improper specimen collection / handling, submission of specimen other  than nasopharyngeal swab, presence of viral mutation(s) within the  areas targeted by this assay, and inadequate number of viral copies  (<250 copies / mL). A negative result must be combined with clinical  observations, patient history, and epidemiological information. If result is POSITIVE SARS-CoV-2 target nucleic acids are DETECTED. The SARS-CoV-2 RNA is generally detectable in upper and lower  respiratory specimens dur ing the acute phase of infection.  Positive  results are indicative of active infection with SARS-CoV-2.  Clinical  correlation with patient history and other diagnostic information is  necessary to determine patient infection status.  Positive results do  not rule out bacterial infection or co-infection with other viruses. If result is  PRESUMPTIVE POSTIVE SARS-CoV-2 nucleic acids MAY BE PRESENT.   A presumptive positive result was obtained on the submitted specimen  and confirmed on repeat testing.  While 2019 novel coronavirus  (SARS-CoV-2) nucleic acids may be present in the submitted sample  additional confirmatory testing may be necessary for epidemiological  and / or clinical management purposes  to differentiate between  SARS-CoV-2 and other Sarbecovirus currently known to infect humans.  If clinically indicated additional testing with an alternate test  methodology [phone]) is advised. The SARS-CoV-2 RNA is generally  detectable in upper and lower respiratory sp ecimens during the acute  phase of infection. The expected result is Negative. Fact Sheet for Patients:  StrictlyIdeas.no Fact Sheet for Healthcare Providers: BankingDealers.co.za This test is not yet approved or cleared by the [place] FDA and has been authorized for detection and/or diagnosis of SARS-CoV-2 by FDA under an Emergency Use Authorization (EUA).  This EUA will remain in effect (meaning this test can be used) for the duration of the COVID-19 declaration under Section 564(b)(1) of the Act, 21 U.S.C. section 360bbb-3(b)(1), unless the authorization is terminated or revoked sooner. Performed at [facility], [place] [address], [place], [place] [address]   Troponin I (High Sensitivity)     Status: Abnormal   Collection Time: 10/03/19  9:28 PM  Result Value Ref Range   Troponin I (High Sensitivity) 23 (H) <18 ng/L    Comment: (NOTE) Elevated high sensitivity troponin I (hsTnI) values and significant  changes across serial measurements may suggest ACS but many other  chronic and acute conditions are known to elevate hsTnI results.  Refer to the "Links" section for chest pain algorithms and additional  guidance. Performed at [facility], [place] [address], [place], [place] [address]    Dg Chest 2 View  Result Date: 10/03/2019 CLINICAL DATA:  Shortness of breath since right hip surgery 4 weeks ago. Bilateral lower extremity swelling. Fever. EXAM: CHEST - 2 VIEW COMPARISON:  05/05/2019 FINDINGS: Decreased inspiration. No gross change in mild enlargement of the cardiac silhouette and prominence of the pulmonary vasculature. Interval moderate-sized bilateral pleural effusions and mild bibasilar patchy and linear density. Diffuse osteopenia. IMPRESSION: 1. Interval moderate-sized bilateral pleural effusions and mild bibasilar atelectasis and possible pneumonia. 2. Stable cardiomegaly and pulmonary vascular congestion. Electronically Signed   By: Claudie Revering M.D.   On: 10/03/2019 19:26   Ct Angio Chest Pe W And/or Wo Contrast  Result Date: 10/03/2019 CLINICAL DATA:  Right hip surgery, shortness of breath EXAM: CT ANGIOGRAPHY CHEST WITH CONTRAST TECHNIQUE: Multidetector CT imaging of the chest was performed using the standard protocol during bolus administration of intravenous contrast. Multiplanar CT image reconstructions and MIPs were obtained to evaluate the vascular anatomy. CONTRAST:  193mL OMNIPAQUE IOHEXOL 350 MG/ML SOLN COMPARISON:  None. FINDINGS: Cardiovascular: Satisfactory opacification of the pulmonary arteries to the segmental level. No evidence of pulmonary embolism. Cardiomegaly. Three-vessel coronary artery calcifications. No pericardial effusion. Aortic atherosclerosis. Mediastinum/Nodes: No enlarged mediastinal, hilar, or axillary lymph nodes. Calcified right hilar lymph nodes. Thyroid gland, trachea, and esophagus demonstrate no significant findings. Lungs/Pleura: Small to moderate bilateral pleural effusions and associated atelectasis or consolidation. Mild interlobular septal thickening (series 10, image 18). Benign calcified pulmonary nodule of the right upper lobe. Upper Abdomen: No acute abnormality. Musculoskeletal: No chest wall  abnormality. No acute or significant osseous findings. Benign, trabeculated hemangioma of T6 vertebral body. Review of the MIP images confirms the above findings. IMPRESSION: 1.  Negative examination for pulmonary embolism. 2. Small to moderate bilateral pleural effusions and associated atelectasis or consolidation. Mild interlobular septal thickening (series 10, image 18). Findings are consistent with pulmonary edema. 3.  Cardiomegaly and three-vessel coronary artery calcifications. 4.  Aortic Atherosclerosis (ICD10-I70.0). Electronically Signed   By: Eddie Candle M.D.   On: 10/03/2019 21:02    Pending Labs Unresulted Labs (From admission, onward)    Start     Ordered   10/04/19 0500  Magnesium  Tomorrow morning,   R     10/04/19 0010   10/04/19 [id number]  Basic metabolic panel  Tomorrow morning,   R     10/04/19 0010   10/04/19 0500  CBC  Tomorrow morning,   R     10/04/19 0010   10/03/19 2344  TSH  Add-on,   AD     10/03/19 2344   10/03/19 2343  Magnesium  Add-on,   AD     10/03/19 2342   10/03/19 2322  Blood culture (routine x 2)  BLOOD CULTURE X 2,   STAT     10/03/19 2321          Vitals/Pain Today's Vitals   10/03/19 1853 10/03/19 1933 10/03/19 2022 10/03/19 2200  BP: (!) 180/94  (!) 164/59 (!) 185/92  Pulse: 84  80 88  Resp: 18  (!) 25 16  Temp: 98.4 F (36.9 C)     TempSrc: Oral     SpO2: 98%  95% 94%  Height:      PainSc:  0-No pain  Isolation Precautions No active isolations  Medications Medications  sodium chloride (PF) 0.9 % injection (has no administration in time range)  aztreonam (AZACTAM) 2 g in sodium chloride 0.9 % 100 mL IVPB (has no administration in time range)  vancomycin (VANCOCIN) IVPB 1000 mg/200 mL premix (has no administration in time range)  heparin injection 5,000 Units (has no administration in time range)  sodium chloride flush (NS) 0.9 % injection 3 mL (has no administration in time range)  acetaminophen (TYLENOL) tablet 650 mg (has no  administration in time range)    Or  acetaminophen (TYLENOL) suppository 650 mg (has no administration in time range)  amLODipine (NORVASC) tablet 5 mg (has no administration in time range)  feeding supplement (ENSURE ENLIVE) (ENSURE ENLIVE) liquid 237 mL (has no administration in time range)  metoprolol succinate (TOPROL-XL) 24 hr tablet 25 mg (has no administration in time range)  iohexol (OMNIPAQUE) 350 MG/ML injection 100 mL (100 mLs Intravenous Contrast Given 10/03/19 2035)    Mobility walks with device

## 2019-10-04 NOTE — Progress Notes (Signed)
  Echocardiogram 2D Echocardiogram has been performed.  Mercadez Heitman G Madolyn Ackroyd 10/04/2019, 2:20 PM

## 2019-10-04 NOTE — Plan of Care (Signed)
  Problem: Education: Goal: Knowledge of General Education information will improve Description: Including pain rating scale, medication(s)/side effects and non-pharmacologic comfort measures Outcome: Completed/Met   Problem: Clinical Measurements: Goal: Ability to maintain clinical measurements within normal limits will improve Outcome: Progressing Goal: Will remain free from infection Outcome: Progressing Goal: Diagnostic test results will improve Outcome: Progressing Goal: Respiratory complications will improve Outcome: Progressing Goal: Cardiovascular complication will be avoided Outcome: Progressing   Problem: Activity: Goal: Risk for activity intolerance will decrease Outcome: Progressing   Problem: Nutrition: Goal: Adequate nutrition will be maintained Outcome: Progressing   Problem: Coping: Goal: Level of anxiety will decrease Outcome: Progressing   Problem: Pain Managment: Goal: General experience of comfort will improve Outcome: Progressing   Problem: Safety: Goal: Ability to remain free from injury will improve Outcome: Progressing   Problem: Skin Integrity: Goal: Risk for impaired skin integrity will decrease Outcome: Completed/Met

## 2019-10-04 NOTE — Evaluation (Signed)
Physical Therapy Evaluation Patient Details Name: HORACE CATHERINE MRN: [id number] DOB: 1930-11-05 Today's Date: 10/04/2019   History of Present Illness  85 yo female admitted wtih bil pleural effusion. Hx of R hip hemi-anterior 09/03/19, falls, pelvic fracture, osteoporosis  Clinical Impression  On eval, pt was Min guard assist for mobility. She walked ~75 feet with a RW. Pt presents with general weakness, decreased activity tolerance, and impaired gait and balance. Discussed d/c plan-pt stated she had help from [facility] (at night) prior to admission. Encouraged pt to consider having aides come back out for 24/7 supervision/assist for safety until she can get stronger/safer. If pt cannot arrange 24/7 care, may need to consider SNF placement again. Will continue to follow and progress activity as tolerated.     Follow Up Recommendations Home health PT;Supervision/Assistance - 24 hour vs SNF(if 24 hour care not available, may need snf)    Equipment Recommendations  None recommended by PT    Recommendations for Other Services       Precautions / Restrictions Precautions Precautions: Fall Restrictions Weight Bearing Restrictions: No      Mobility  Bed Mobility Overal bed mobility: Needs Assistance Bed Mobility: Supine to Sit     Supine to sit: Min guard;HOB elevated     General bed mobility comments: increased time. min guard for safety.  Transfers Overall transfer level: Needs assistance Equipment used: Rolling walker (2 wheeled) Transfers: Sit to/from Stand Sit to Stand: Min guard         General transfer comment: increased time. cues for safety, hand placement. very shaky.  Ambulation/Gait Ambulation/Gait assistance: Min guard Gait Distance (Feet): 75 Feet Assistive device: Rolling walker (2 wheeled) Gait Pattern/deviations: Step-through pattern;Decreased stride length     General Gait Details: min guard for safety. slow gait speed. no LOB with RW use.  shaky.  Stairs            Wheelchair Mobility    Modified Rankin (Stroke Patients Only)       Balance Overall balance assessment: Needs assistance;History of Falls         Standing balance support: Bilateral upper extremity supported Standing balance-Leahy Scale: Poor                               Pertinent Vitals/Pain Pain Assessment: Faces Faces Pain Scale: Hurts little more Pain Location: L flank Pain Descriptors / Indicators: Sore Pain Intervention(s): Monitored during session;Repositioned    Home Living Family/patient expects to be discharged to:: Private residence Living Arrangements: Alone                    Prior Function                 Hand Dominance        Extremity/Trunk Assessment   Upper Extremity Assessment Upper Extremity Assessment: Generalized weakness    Lower Extremity Assessment Lower Extremity Assessment: Generalized weakness    Cervical / Trunk Assessment Cervical / Trunk Assessment: Kyphotic  Communication      Cognition Arousal/Alertness: Awake/alert Behavior During Therapy: WFL for tasks assessed/performed Overall Cognitive Status: Within Functional Limits for tasks assessed                                        General Comments      Exercises  Assessment/Plan    PT Assessment Patient needs continued PT services  PT Problem List Decreased strength;Decreased mobility;Decreased balance;Decreased knowledge of use of DME;Decreased activity tolerance;Pain       PT Treatment Interventions DME instruction;Therapeutic activities;Gait training;Therapeutic exercise;Patient/family education;Balance training;Functional mobility training    PT Goals (Current goals can be found in the Care Plan section)  Acute Rehab PT Goals Patient Stated Goal: to get better/stronger PT Goal Formulation: With patient Time For Goal Achievement: 10/18/19 Potential to Achieve Goals: Good     Frequency Min 3X/week   Barriers to discharge Decreased caregiver support      Co-evaluation               AM-PAC PT "6 Clicks" Mobility  Outcome Measure Help needed turning from your back to your side while in a flat bed without using bedrails?: A Little Help needed moving from lying on your back to sitting on the side of a flat bed without using bedrails?: A Little Help needed moving to and from a bed to a chair (including a wheelchair)?: A Little Help needed standing up from a chair using your arms (e.g., wheelchair or bedside chair)?: A Little Help needed to walk in hospital room?: A Little Help needed climbing 3-5 steps with a railing? : A Little 6 Click Score: 18    End of Session Equipment Utilized During Treatment: Gait belt Activity Tolerance: Patient tolerated treatment well Patient left: in chair;with call bell/phone within reach;with chair alarm set   PT Visit Diagnosis: History of falling (Z91.81);Unsteadiness on feet (R26.81);Muscle weakness (generalized) (M62.81);Repeated falls (R29.6)    Time: [id number] PT Time Calculation (min) (ACUTE ONLY): 27 min   Charges:   PT Evaluation $PT Eval Moderate Complexity: 1 Mod PT Treatments $Gait Training: 8-22 mins          Weston Anna, PT Acute Rehabilitation Services Pager: [phone] Office: [phone]

## 2019-10-04 NOTE — Progress Notes (Signed)
Per telemetry, pt had 11 beat run of [place]. RN at bedside at this time. Pt alert and conversing with RN and MD. MD made aware. Will continue to monitor.

## 2019-10-04 NOTE — ED Notes (Signed)
Admitting MD at bedside.

## 2019-10-05 LAB — BASIC METABOLIC PANEL
Anion gap: 10 (ref 5–15)
BUN: 17 mg/dL (ref 8–23)
CO2: 28 mmol/L (ref 22–32)
Calcium: 8.5 mg/dL — ABNORMAL LOW (ref 8.9–10.3)
Chloride: 101 mmol/L (ref 98–111)
Creatinine, Ser: 0.45 mg/dL (ref 0.44–1.00)
GFR calc Af Amer: >60 mL/min (ref >60)
GFR calc non Af Amer: >60 mL/min (ref >60)
Glucose, Bld: 99 mg/dL (ref 70–99)
Potassium: 3.5 mmol/L (ref 3.5–5.1)
Sodium: 139 mmol/L (ref 135–145)

## 2019-10-05 MED ORDER — FUROSEMIDE 40 MG PO TABS
40.0000 mg | ORAL_TABLET | Freq: Two times a day (BID) | ORAL | Status: DC
  Administered 2019-10-05 – 2019-10-07 (×4): 40 mg via ORAL
  Filled 2019-10-05 (×4): qty 1

## 2019-10-05 MED ORDER — METOPROLOL SUCCINATE ER 25 MG PO TB24
37.5000 mg | ORAL_TABLET | Freq: Every day | ORAL | Status: DC
  Administered 2019-10-06 – 2019-10-07 (×2): 37.5 mg via ORAL
  Filled 2019-10-05 (×2): qty 2

## 2019-10-05 NOTE — Telephone Encounter (Signed)
Called and spoke to [place].  She informed me that the pt was admitted to the hospital on 10/03/2019.  No need for orders at this time.

## 2019-10-05 NOTE — Progress Notes (Signed)
Physical Therapy Treatment Patient Details Name: Alicia Boyd MRN: [id number] DOB: 08/22/1930 Today's Date: 10/05/2019    History of Present Illness 85 yo female admitted wtih bil pleural effusion. Hx of R hip hemi-anterior 09/03/19, falls, pelvic fracture, osteoporosis    PT Comments    Progressing with mobility. Pt tolerated increased distance well. 2nd time she walked today actually. Discussed d/c plan-encouraged her to consider setting up increased help at home.    Follow Up Recommendations  Home health PT;Supervision/Assistance - 24 hour     Equipment Recommendations  None recommended by PT    Recommendations for Other Services       Precautions / Restrictions Restrictions Weight Bearing Restrictions: No    Mobility  Bed Mobility               General bed mobility comments: oob in recliner  Transfers Overall transfer level: Needs assistance Equipment used: Rolling walker (2 wheeled) Transfers: Sit to/from Stand Sit to Stand: Min guard         General transfer comment: increased time, min guard for safety  Ambulation/Gait Ambulation/Gait assistance: Min guard Gait Distance (Feet): 225 Feet Assistive device: Rolling walker (2 wheeled) Gait Pattern/deviations: Step-through pattern;Decreased stride length     General Gait Details: min guard for safety. slow gait speed. no LOB with RW use.   Stairs             Wheelchair Mobility    Modified Rankin (Stroke Patients Only)       Balance Overall balance assessment: Needs assistance;History of Falls         Standing balance support: Bilateral upper extremity supported Standing balance-Leahy Scale: Poor                              Cognition Arousal/Alertness: Awake/alert Behavior During Therapy: WFL for tasks assessed/performed Overall Cognitive Status: Within Functional Limits for tasks assessed                                        Exercises       General Comments        Pertinent Vitals/Pain Pain Assessment: No/denies pain    Home Living                      Prior Function            PT Goals (current goals can now be found in the care plan section) Progress towards PT goals: Progressing toward goals    Frequency    Min 3X/week      PT Plan Current plan remains appropriate    Co-evaluation              AM-PAC PT "6 Clicks" Mobility   Outcome Measure  Help needed turning from your back to your side while in a flat bed without using bedrails?: A Little Help needed moving from lying on your back to sitting on the side of a flat bed without using bedrails?: A Little Help needed moving to and from a bed to a chair (including a wheelchair)?: A Little Help needed standing up from a chair using your arms (e.g., wheelchair or bedside chair)?: A Little Help needed to walk in hospital room?: A Little Help needed climbing 3-5 steps with a railing? : A Little 6 Click Score: 18  End of Session Equipment Utilized During Treatment: Gait belt Activity Tolerance: Patient tolerated treatment well Patient left: in chair;with call bell/phone within reach;with chair alarm set   PT Visit Diagnosis: History of falling (Z91.81);Unsteadiness on feet (R26.81);Muscle weakness (generalized) (M62.81);Repeated falls (R29.6)     Time: [id number] PT Time Calculation (min) (ACUTE ONLY): 16 min  Charges:  $Gait Training: 8-22 mins                        Weston Anna, PT Acute Rehabilitation Services Pager: [phone] Office: [phone]

## 2019-10-05 NOTE — Telephone Encounter (Signed)
Ok for verbal orders ?

## 2019-10-05 NOTE — Progress Notes (Signed)
Occupational Therapy Treatment Patient Details Name: Alicia Boyd MRN: [id number] DOB: 1930-09-04 Today's Date: 10/05/2019    History of present illness 85 yo female admitted wtih bil pleural effusion. Hx of R hip hemi-anterior 09/03/19, falls, pelvic fracture, osteoporosis   OT comments  Pt got up to chair and performed grooming this am.  Using purewick due to lasix.  Pt did not want to perform ADL as she was cleaned up thoroughly earlier after purewick leak.  Follow Up Recommendations  Home health OT;Supervision/Assistance - 24 hour(vs snf)    Equipment Recommendations  None recommended by OT    Recommendations for Other Services      Precautions / Restrictions Precautions Precautions: Fall Restrictions Weight Bearing Restrictions: No       Mobility Bed Mobility         Supine to sit: Supervision        Transfers       Sit to Stand: Min guard Stand pivot transfers: Min guard       General transfer comment: increased time, min guard for safety    Balance                                           ADL either performed or assessed with clinical judgement   ADL       Grooming: Supervision/safety;Wash/dry face;Brushing hair                   Toilet Transfer: Min guard;Ambulation;RW   Toileting- Clothing Manipulation and Hygiene: (to chair)         General ADL Comments: pt did not want to complete ADL:  states she was cleaned up well earlier due to purewick leak.       Vision       Perception     Praxis      Cognition Arousal/Alertness: Awake/alert Behavior During Therapy: WFL for tasks assessed/performed Overall Cognitive Status: Within Functional Limits for tasks assessed                                          Exercises     Shoulder Instructions       General Comments      Pertinent Vitals/ Pain       Pain Assessment: No/denies pain  Home Living                                           Prior Functioning/Environment              Frequency  Min 2X/week        Progress Toward Goals  OT Goals(current goals can now be found in the care plan section)  Progress towards OT goals: Progressing toward goals     Plan      Co-evaluation                 AM-PAC OT "6 Clicks" Daily Activity     Outcome Measure   Help from another person eating meals?: None Help from another person taking care of personal grooming?: A Little Help from another person toileting, which includes using toliet, bedpan, or urinal?: A Little Help from  another person bathing (including washing, rinsing, drying)?: A Little Help from another person to put on and taking off regular upper body clothing?: A Little Help from another person to put on and taking off regular lower body clothing?: A Little 6 Click Score: 19    End of Session    OT Visit Diagnosis: Unsteadiness on feet (R26.81);Repeated falls (R29.6);Muscle weakness (generalized) (M62.81)   Activity Tolerance Patient tolerated treatment well   Patient Left in chair;with call bell/phone within reach   Nurse Communication          Time: [id number] OT Time Calculation (min): 18 min  Charges: OT General Charges $OT Visit: 1 Visit OT Treatments $Self Care/Home Management : 8-22 mins  Lesle Chris, OTR/L Acute Rehabilitation Services [phone] [place] pager [phone] office 10/05/2019   [name] 10/05/2019, 10:25 AM

## 2019-10-05 NOTE — Plan of Care (Signed)
  Problem: Coping: Goal: Level of anxiety will decrease Outcome: Progressing   Problem: Pain Managment: Goal: General experience of comfort will improve Outcome: Progressing   Problem: Safety: Goal: Ability to remain free from injury will improve Outcome: Progressing   

## 2019-10-05 NOTE — Progress Notes (Signed)
PROGRESS NOTE    Alicia Boyd  [id number] DOB: 1930/07/12 DOA: 10/03/2019 PCP: [name], NP  Brief Narrative: As per H&P:  85 y.o. female with medical history significant for hypertension, hyperlipidemia, and osteoporosis who presents to the ED for evaluation of shortness of breath and swelling in her legs. recent hospitalization 09/03/2019-09/09/2019 for a right hip fracture  with right hemiarthroplasty  (9/4)- discharged on aspirin 325 mg twice daily for 1 month for VTE prophylaxis to SNF--dc from SNF --> home alone  DOE when working with her therapist-continued shortness of breath with exertional activity + swelling in both of her lower legs + infrequent cough without sputum production  - subjective fevers, chills, or diaphoresis.  + urinary frequency without dysuria.  - abdominal pain, diarrhea, or constipation.    + activity is limited due to her dyspnea.   presented to the ED 10/4 for further evaluation after discussing with her primary care provider.  ED Course: BP on higher side-180/94,SPO2 98% on room air. Labs stable except for potassium 3.2, bicarb 28,BNP 525.2, high-sensitivity troponin I 24 and 23.  Urinalysis was negative for UTI. SARS-CoV-2 test is negative.  Blood cultures were ordered.CXR -new moderate sized bilateral pleural effusions and mild bibasilar atelectasis with stable cardiomegaly and pulmonary vascular congestion. CTA chest PE study was negative for pulmonary embolism and showed small to moderate bilateral pleural effusions, cardiomegaly and three-vessel coronary artery calcifications were noted. Patient was ordered to receive IV vancomycin and aztreonam and the hospitalist service was consulted to admit for further evaluation and management.  antibiotics were discontinued on admission and was placed on IV Lasix  Subjective: Awake alert coherent and in nad Overall feels less swollen in LE's - fever,-chills-cp -doe less swollen -n -vomit -diarr -dysuria   Assessment & Plan:   Bilateral pleural effusion with progressive dyspnea on exertion: No PE on CTA, BNP elevated at 525 COVID-19 negative likely fluid overload/ diastolic chf Patient overall feels improving on diuresis, continue IV Lasix. Monitor intake output and daily weight.   Rpt echo atrial enlargement-concern for Amyloid--Cardiology Dr. Stanford Breed feels can be worked up as OP c Cardiac MRI chest x-ray 10/5 still shows pleural effusion.  ?persistent pleural effusion need IR thoracentesis in 24-48 h--rpt cxr in am  Essential hypertension: Blood pressure poorly controlled [id number] to [id number] systolic.  Consolidate to [place] xl 37.5, d/c amlodipine [can cause Le swelling] change IV Lasix 20 to 40 po bid -2.1 liters so far.  Hypokalemia: Potassium 3.5, monitor while on diuresis.  Nonsustained V. Tach: 4-5 beats on 10/5--monitor electrolytes, keep above 4 add potassium chloride. Mag at 1.9. Monitor in tele.  Rpt am labs  Positive troponin, flat at 24-23, no chest pain likely in the setting of CHF.   Anemia NOS- likely from chronic disease hemoglobin at 9.7 g.  Monitor.  Status post right hemiarthroplasty on 9/4, continue PT OT.  Body mass index is 19.52 kg/m.    DVT prophylaxis: Heparin. Code Status: DNR. Family Communication:plan of care discussed with patient in detail. D/w Ms Georg Ruddle with pulmonary[ [id number] who looks in on her in the OP setting   Disposition Plan: Remains inpatient pending further improvement in the pleural effusion, leg edema, continues to need IV diuresis and will need to repeat chest x-ray after diuresis and if does not improve will likely need thoracentesis. Patient lives alone with poor support at home, had recent hip fracture from fall. Almost fell at home past few days- was caught by PT/Aid at home.  Will obtain PT OT to follow for dispo recommendations. Unsafe at home--will need further input  Consultants: none  Procedures: Cx 10/04/19 r:"Small to moderate  pleural effusions have increased since the most recent examination"  ECHO 10/5 IMPRESSIONS  1. Left ventricular ejection fraction, by visual estimation, is 50 to 55%. The left ventricle has normal function. There is moderately increased left ventricular hypertrophy.  2. Left ventricular diastolic Doppler parameters are consistent with pseudonormalization pattern of LV diastolic filling.  3. Global right ventricle has normal systolic function.The right ventricular size is normal. No increase in right ventricular wall thickness.  4. Moderate pleural effusion.  5. The aortic valve is tricuspid Aortic valve regurgitation is mild to moderate by color flow Doppler.  6. Thickened interatrial septum measuring 15 mm  7. Left atrial size was severely dilated.  8. Normal pulmonary artery systolic pressure.  9. The inferior vena cava is normal in size with greater than 50% respiratory variability, suggesting right atrial pressure of 3 mmHg. 10. Given moderate LVH with significant atrial dilatation and interatrial septal thickening, would consider evaluation for cardiac amyloidosis. Would consider cardiac MRI if clinically indicated.   Microbiology:  Antimicrobials: Anti-infectives (From admission, onward)   Start     Dose/Rate Route Frequency Ordered Stop   10/03/19 2345  vancomycin (VANCOCIN) IVPB 1000 mg/200 mL premix  Status:  Discontinued     1,000 mg 200 mL/hr over 60 Minutes Intravenous  Once 10/03/19 2330 10/04/19 0016   10/03/19 2330  aztreonam (AZACTAM) 2 g in sodium chloride 0.9 % 100 mL IVPB  Status:  Discontinued     2 g 200 mL/hr over 30 Minutes Intravenous  Once 10/03/19 2321 10/04/19 0016       Objective: Vitals:   10/04/19 2029 10/05/19 0418 10/05/19 0420 10/05/19 1305  BP: 131/62 (!) 154/76  (!) 146/64  Pulse: 79 76  78  Resp: 20 18  16   Temp: 98.2 F (36.8 C) 98.3 F (36.8 C)  98.7 F (37.1 C)  TempSrc: Oral Oral  Oral  SpO2: 98% 94%  95%  Weight:   53.2 kg    Height:        Intake/Output Summary (Last 24 hours) at 10/05/2019 1307 Last data filed at 10/05/2019 0505 Gross per 24 hour  Intake 483 ml  Output 1100 ml  Net -617 ml   Filed Weights   10/04/19 0139 10/04/19 0745 10/05/19 0420  Weight: 55.6 kg 57.9 kg 53.2 kg   Weight change: 2.3 kg  Body mass index is 19.52 kg/m.  Intake/Output from previous day: 10/05 0701 - 10/06 0700 In: 963 [P.O.:960; I.V.:3] Out: 1800 [Urine:1800] Intake/Output this shift: No intake/output data recorded.  Examination:  Awake coherent in nad no ict no pallor  No jvd no carotid cta b no added sound s1 s2 no m/r/g abd soft nt dn no rebound no guard Trace le edema  Medications:  Scheduled Meds: . amLODipine  5 mg Oral QPM  . feeding supplement (ENSURE ENLIVE)  237 mL Oral BID BM  . furosemide  20 mg Intravenous Q12H  . heparin  5,000 Units Subcutaneous Q8H  . metoprolol succinate  25 mg Oral Daily  . sodium chloride flush  3 mL Intravenous Q12H   Continuous Infusions:  Data Reviewed: I have personally reviewed following labs and imaging studies  CBC: Recent Labs  Lab 10/03/19 1930 10/04/19 0511  WBC 7.3 6.0  NEUTROABS 5.3  --   HGB 11.2* 9.7*  HCT 34.9* 30.2*  MCV  97.8 96.8  PLT 297 [id number]   Basic Metabolic Panel: Recent Labs  Lab 10/03/19 1930 10/03/19 2359 10/04/19 0511 10/05/19 0511  NA 139  --  139 139  K 3.2*  --  3.6 3.5  CL 103  --  105 101  CO2 28  --  29 28  GLUCOSE 105*  --  116* 99  BUN 16  --  10 17  CREATININE 0.34*  --  0.35* 0.45  CALCIUM 9.1  --  8.6* 8.5*  MG  --  2.0 1.9  --    GFR: Estimated Creatinine Clearance: 42.4 mL/min (by C-G formula based on SCr of 0.45 mg/dL). Liver Function Tests: Recent Labs  Lab 10/03/19 1930  AST 16  ALT 21  ALKPHOS 127*  BILITOT 0.6  PROT 6.1*  ALBUMIN 3.6   No results for input(s): LIPASE, AMYLASE in the last 168 hours. No results for input(s): AMMONIA in the last 168 hours. Coagulation Profile: No results  for input(s): INR, PROTIME in the last 168 hours. Cardiac Enzymes: No results for input(s): CKTOTAL, CKMB, CKMBINDEX, TROPONINI in the last 168 hours. BNP (last 3 results) No results for input(s): PROBNP in the last 8760 hours. HbA1C: No results for input(s): HGBA1C in the last 72 hours. CBG: No results for input(s): GLUCAP in the last 168 hours. Lipid Profile: No results for input(s): CHOL, HDL, LDLCALC, TRIG, CHOLHDL, LDLDIRECT in the last 72 hours. Thyroid Function Tests: Recent Labs    10/03/19 2344  TSH 2.526   Anemia Panel: No results for input(s): VITAMINB12, FOLATE, FERRITIN, TIBC, IRON, RETICCTPCT in the last 72 hours. Sepsis Labs: Recent Labs  Lab 10/03/19 2351  PROCALCITON <0.10    Recent Results (from the past 240 hour(s))  SARS Coronavirus 2 [facility] order, Performed in [facility] lab) Nasopharyngeal Nasopharyngeal Swab     Status: None   Collection Time: 10/03/19  7:34 PM   Specimen: Nasopharyngeal Swab  Result Value Ref Range Status   SARS Coronavirus 2 NEGATIVE NEGATIVE Final    Comment: (NOTE) If result is NEGATIVE SARS-CoV-2 target nucleic acids are NOT DETECTED. The SARS-CoV-2 RNA is generally detectable in upper and lower  respiratory specimens during the acute phase of infection. The lowest  concentration of SARS-CoV-2 viral copies this assay can detect is 250  copies / mL. A negative result does not preclude SARS-CoV-2 infection  and should not be used as the sole basis for treatment or other  patient management decisions.  A negative result may occur with  improper specimen collection / handling, submission of specimen other  than nasopharyngeal swab, presence of viral mutation(s) within the  areas targeted by this assay, and inadequate number of viral copies  (<250 copies / mL). A negative result must be combined with clinical  observations, patient history, and epidemiological information. If result is POSITIVE SARS-CoV-2 target  nucleic acids are DETECTED. The SARS-CoV-2 RNA is generally detectable in upper and lower  respiratory specimens dur ing the acute phase of infection.  Positive  results are indicative of active infection with SARS-CoV-2.  Clinical  correlation with patient history and other diagnostic information is  necessary to determine patient infection status.  Positive results do  not rule out bacterial infection or co-infection with other viruses. If result is PRESUMPTIVE POSTIVE SARS-CoV-2 nucleic acids MAY BE PRESENT.   A presumptive positive result was obtained on the submitted specimen  and confirmed on repeat testing.  While 2019 novel coronavirus  (SARS-CoV-2) nucleic acids may  be present in the submitted sample  additional confirmatory testing may be necessary for epidemiological  and / or clinical management purposes  to differentiate between  SARS-CoV-2 and other Sarbecovirus currently known to infect humans.  If clinically indicated additional testing with an alternate test  methodology [phone]) is advised. The SARS-CoV-2 RNA is generally  detectable in upper and lower respiratory sp ecimens during the acute  phase of infection. The expected result is Negative. Fact Sheet for Patients:  StrictlyIdeas.no Fact Sheet for Healthcare Providers: BankingDealers.co.za This test is not yet approved or cleared by the [place] FDA and has been authorized for detection and/or diagnosis of SARS-CoV-2 by FDA under an Emergency Use Authorization (EUA).  This EUA will remain in effect (meaning this test can be used) for the duration of the COVID-19 declaration under Section 564(b)(1) of the Act, 21 U.S.C. section 360bbb-3(b)(1), unless the authorization is terminated or revoked sooner. Performed at [facility], [place] [address], [place], [place] [address]   Blood culture (routine x 2)     Status: None (Preliminary result)    Collection Time: 10/03/19 11:51 PM   Specimen: BLOOD  Result Value Ref Range Status   Specimen Description   Final    BLOOD LEFT ANTECUBITAL Performed at [place] [address] [place], [place] [address]    Special Requests   Final    BOTTLES DRAWN AEROBIC AND ANAEROBIC Blood Culture adequate volume Performed at [place] [address] [place], [place] [address]    Culture   Final    NO GROWTH 1 DAY Performed at [facility], [place] [address], [place], [place] [address]    Report Status PENDING  Incomplete  Blood culture (routine x 2)     Status: None (Preliminary result)   Collection Time: 10/03/19 11:51 PM   Specimen: BLOOD  Result Value Ref Range Status   Specimen Description   Final    BLOOD RIGHT ANTECUBITAL Performed at [place] [address], [place], [place] [address]    Special Requests   Final    BOTTLES DRAWN AEROBIC AND ANAEROBIC Blood Culture adequate volume Performed at [place] [address] [place], [place] [address]    Culture   Final    NO GROWTH 1 DAY Performed at [facility], [place] [address] [place], [place] [address]    Report Status PENDING  Incomplete      Radiology Studies: Dg Chest 2 View  Result Date: 10/04/2019 CLINICAL DATA:  Increasing lower extremity edema and shortness of breath. Cough. EXAM: CHEST - 2 VIEW COMPARISON:  PA and lateral chest 10/03/2019.  CT chest 10/03/2019. FINDINGS: Small to moderate bilateral pleural effusions are increased since the most recent comparison plain films. There is cardiomegaly and mild interstitial edema. No pneumothorax. Aortic atherosclerosis noted. No acute or focal bony abnormality. IMPRESSION: Small to moderate pleural effusions have increased since the most recent examination. No notable change in cardiomegaly and mild interstitial edema. Electronically Signed   By: Inge Rise M.D.   On:  10/04/2019 09:10   Dg Chest 2 View  Result Date: 10/03/2019 CLINICAL DATA:  Shortness of breath since right hip surgery 4 weeks ago. Bilateral lower extremity swelling. Fever. EXAM: CHEST - 2 VIEW COMPARISON:  05/05/2019 FINDINGS: Decreased inspiration. No gross change in mild enlargement of the cardiac silhouette and prominence of the pulmonary vasculature. Interval moderate-sized bilateral pleural effusions and mild bibasilar  patchy and linear density. Diffuse osteopenia. IMPRESSION: 1. Interval moderate-sized bilateral pleural effusions and mild bibasilar atelectasis and possible pneumonia. 2. Stable cardiomegaly and pulmonary vascular congestion. Electronically Signed   By: Claudie Revering M.D.   On: 10/03/2019 19:26   Ct Angio Chest Pe W And/or Wo Contrast  Result Date: 10/03/2019 CLINICAL DATA:  Right hip surgery, shortness of breath EXAM: CT ANGIOGRAPHY CHEST WITH CONTRAST TECHNIQUE: Multidetector CT imaging of the chest was performed using the standard protocol during bolus administration of intravenous contrast. Multiplanar CT image reconstructions and MIPs were obtained to evaluate the vascular anatomy. CONTRAST:  134mL OMNIPAQUE IOHEXOL 350 MG/ML SOLN COMPARISON:  None. FINDINGS: Cardiovascular: Satisfactory opacification of the pulmonary arteries to the segmental level. No evidence of pulmonary embolism. Cardiomegaly. Three-vessel coronary artery calcifications. No pericardial effusion. Aortic atherosclerosis. Mediastinum/Nodes: No enlarged mediastinal, hilar, or axillary lymph nodes. Calcified right hilar lymph nodes. Thyroid gland, trachea, and esophagus demonstrate no significant findings. Lungs/Pleura: Small to moderate bilateral pleural effusions and associated atelectasis or consolidation. Mild interlobular septal thickening (series 10, image 18). Benign calcified pulmonary nodule of the right upper lobe. Upper Abdomen: No acute abnormality. Musculoskeletal: No chest wall abnormality. No acute  or significant osseous findings. Benign, trabeculated hemangioma of T6 vertebral body. Review of the MIP images confirms the above findings. IMPRESSION: 1.  Negative examination for pulmonary embolism. 2. Small to moderate bilateral pleural effusions and associated atelectasis or consolidation. Mild interlobular septal thickening (series 10, image 18). Findings are consistent with pulmonary edema. 3.  Cardiomegaly and three-vessel coronary artery calcifications. 4.  Aortic Atherosclerosis (ICD10-I70.0). Electronically Signed   By: Eddie Candle M.D.   On: 10/03/2019 21:02    LOS: 1 day   Time spent: More than 50% of that time was spent in counseling and/or coordination of care.  Nita Sells, MD [facility]  10/05/2019, 1:07 PM

## 2019-10-05 NOTE — Plan of Care (Signed)
  Problem: Health Behavior/Discharge Planning: Goal: Ability to manage health-related needs will improve Outcome: Progressing   Problem: Clinical Measurements: Goal: Ability to maintain clinical measurements within normal limits will improve Outcome: Progressing Goal: Will remain free from infection Outcome: Progressing Goal: Diagnostic test results will improve Outcome: Progressing Goal: Respiratory complications will improve Outcome: Progressing Goal: Cardiovascular complication will be avoided Outcome: Progressing   Problem: Activity: Goal: Risk for activity intolerance will decrease Outcome: Progressing   Problem: Nutrition: Goal: Adequate nutrition will be maintained Outcome: Progressing   Problem: Coping: Goal: Level of anxiety will decrease Outcome: Progressing   Problem: Pain Managment: Goal: General experience of comfort will improve Outcome: Progressing   Problem: Safety: Goal: Ability to remain free from injury will improve Outcome: Progressing   

## 2019-10-05 NOTE — Consult Note (Signed)
   [facility] CM Inpatient Consult   10/05/2019  KEIRYN SKATES 13-Nov-1930 [id number]    THN: Pending status  Patient's chart reviewed for 30 day readmission, 18% risk score for unplanned readmission and hospitalizations under her Medicare/ NextGen plan; and to check for potential needs of [facility] care management services.  Patient had been outreached by a [facility] RN CM prior to this admission for [facility] follow-up call.  Will notify THN- RN CM for patient's disposition.  Review of medical record and MD brief narrative show as: 85 y.o.femalewith medical history significant forhypertension, hyperlipidemia, and osteoporosis     who presents to the ED for evaluation of shortness of breath and swelling in her legs. Recent hospitalization 09/03/2019-09/09/2019 for a right hip fracture  with right hemiarthroplasty (9/4)- discharged on aspirin 325 mg twice daily for 1 month for VTE prophylaxis to SNF--dc from SNF --> home alone, with poor support at home.   [Bilateral pleural effusion with progressive dyspnea on exertion, essential HTN with BP poorly controlled]  Primary Care Provider isCory Nafziger,NP with [place] at [facility], listed as providing transition of care follow-up.  Review of therapy notes with current recommendation for home with home health therapy vs. SNF (skilled nursing facility- if cannot arrange 24/7 care).  Plan:Will continue to follow withInpatient TOC teamfor needs; follow progress and disposition to assess for post hospital care management needs.  Of note, [facility] Care Management services does not replace or interfere with any services that are arranged by transition of care case management or social work.     For questionsand referral, pleasecontact:  Edwena Felty A. Kaevon Cotta, BSN, RN-BC [facility] Liaison Cell: [phone]

## 2019-10-06 ENCOUNTER — Inpatient Hospital Stay (HOSPITAL_COMMUNITY): Payer: MEDICARE

## 2019-10-06 LAB — BASIC METABOLIC PANEL
Anion gap: 7 (ref 5–15)
BUN: 23 mg/dL (ref 8–23)
CO2: 31 mmol/L (ref 22–32)
Calcium: 8.7 mg/dL — ABNORMAL LOW (ref 8.9–10.3)
Chloride: 102 mmol/L (ref 98–111)
Creatinine, Ser: 0.47 mg/dL (ref 0.44–1.00)
GFR calc Af Amer: >60 mL/min (ref >60)
GFR calc non Af Amer: >60 mL/min (ref >60)
Glucose, Bld: 99 mg/dL (ref 70–99)
Potassium: 4.1 mmol/L (ref 3.5–5.1)
Sodium: 140 mmol/L (ref 135–145)

## 2019-10-06 NOTE — TOC Progression Note (Signed)
Transition of Care [facility]) - Progression Note    Patient Details  Name: Alicia Boyd MRN: [id number] Date of Birth: [birth year]  Transition of Care [facility]) CM/SW Contact  Damyah Gugel, Juliann Pulse, RN Phone Number: 10/06/2019, 12:08 PM  Clinical Narrative:  Left message w/Randy w/my call tel#-await response to discuss d/c plans. Currently active w/[facility]/OT/aide. PT recc HHC. THN active. Will discuss d/c options for a safe d/c.          Expected Discharge Plan and Services                                                 Social Determinants of Health (SDOH) Interventions    Readmission Risk Interventions No flowsheet data found.

## 2019-10-06 NOTE — Consult Note (Signed)
   [facility] CM Inpatient Consult   10/06/2019  NARYIAH TORP Dec 22, 1930 [id number]   THN Follow up:  Per chart review, current patient disposition is for [facility] 1st Program upon transition home. Will continue to follow for final disposition plans.  Netta Cedars, MSN, [facility] Liaison Nurse Mobile Phone [phone]  Toll free office [phone]

## 2019-10-06 NOTE — TOC Initial Note (Signed)
Transition of Care [facility]) - Initial/Assessment Note    Patient Details  Name: Alicia Boyd MRN: [id number] Date of Birth: [birth year]  Transition of Care [facility]) CM/SW Contact:    Dessa Phi, RN Phone Number: 10/06/2019, 1:57 PM  Clinical Narrative: Damaris Schooner to Louie Casa about d/c plans-will return back to [employer].[facility] 1st program(if qualifies)-await response.[employer] to be contacted for set up tonight if possible. Family can transport home on own.                  Expected Discharge Plan: [place] Barriers to Discharge: Continued Medical Work up   Patient Goals and CMS Choice Patient states their goals for this hospitalization and ongoing recovery are:: go home CMS Medicare.gov Compare Post Acute Care list provided to:: Patient Represenative (must comment) Choice offered to / list presented to : Adult Children  Expected Discharge Plan and Services Expected Discharge Plan: [place]   Discharge Planning Services: CM Consult Post Acute Care Choice: [place] arrangements for the past 2 months: [place]                                      Prior Living Arrangements/Services Living arrangements for the past 2 months: [place] Lives with:: Facility Resident Patient language and need for interpreter reviewed:: Yes Do you feel safe going back to the place where you live?: Yes      Need for Family Participation in Patient Care: No (Comment) Care giver support system in place?: Yes (comment) Current home services: DME, Homehealth aide, Home PT, Home OT(rw,w/c,3n1;Active w/[facility]/OT/aide;comfort keepers.) Criminal Activity/Legal Involvement Pertinent to Current Situation/Hospitalization: No - Comment as needed  Activities of Daily Living Home Assistive Devices/Equipment: Environmental consultant (specify type), Other (Comment), Eyeglasses, Raised toilet seat with  rails, Shower chair with back, [place] ADL Screening (condition at time of admission) Patient's cognitive ability adequate to safely complete daily activities?: Yes Is the patient deaf or have difficulty hearing?: No Does the patient have difficulty seeing, even when wearing glasses/contacts?: No Does the patient have difficulty concentrating, remembering, or making decisions?: No Patient able to express need for assistance with ADLs?: Yes Does the patient have difficulty dressing or bathing?: No Independently performs ADLs?: Yes (appropriate for developmental age) Does the patient have difficulty walking or climbing stairs?: Yes Weakness of Legs: None Weakness of Arms/Hands: None  Permission Sought/Granted Permission sought to share information with : Case Manager Permission granted to share information with : Yes, Verbal Permission Granted           Permission granted to share info w Contact Information: Louie Casa [phone]  Emotional Assessment Appearance:: Appears stated age Attitude/Demeanor/Rapport: Gracious Affect (typically observed): Accepting Orientation: : Oriented to Self Alcohol / Substance Use: Not Applicable Psych Involvement: No (comment)  Admission diagnosis:  Shortness of breath [R06.02] Cough [R05] Peripheral edema [R60.9] Elevated brain natriuretic peptide (BNP) level [R79.89] HCAP (healthcare-associated pneumonia) [J18.9] Hypervolemia, unspecified hypervolemia type [E87.70] Patient Active Problem List   Diagnosis Date Noted  . Bilateral pleural effusion 10/03/2019  . Hypokalemia 10/03/2019  . Malnutrition of moderate degree 09/08/2019  . Displaced fracture of right femoral neck ([place]) 09/03/2019  . Failure to thrive in adult 09/01/2019  . Quadriceps strain, right, initial encounter 09/01/2019  . Anxiety and depression 07/05/2019  . Greater trochanteric bursitis of left hip 06/22/2019  .  Closed fracture of multiple pubic rami, right, initial encounter  ([place]) 05/28/2019  . Greater trochanteric bursitis of right hip 01/05/2019  . Hypertensive retinopathy, bilateral 04/29/2018  . Nuclear sclerosis of both eyes 04/29/2018  . Degenerative arthritis of left knee 04/16/2018  . Mild carpal tunnel syndrome of right wrist 09/24/2017  . Ecchymoses, spontaneous 07/29/2016  . Varicose veins 07/29/2016  . Nasal congestion 07/29/2016  . Hyperglycemia 07/12/2015  . Retinal hemorrhage, right 06/06/2014  . Paresthesia of hand 03/22/2014  . Undiagnosed cardiac murmurs 03/22/2014  . Cataract 04/10/2012  . Ocular migraine 04/10/2012  . Palpitations 03/03/2012  . ANEMIA, MILD 07/27/2010  . Venous (peripheral) insufficiency 07/27/2010  . OTHER CONSTIPATION 04/17/2010  . Essential hypertension 04/06/2010  . RHINOCONJUNCTIVITIS, ALLERGIC 04/04/2010  . PERSONAL HISTORY OTHER DISORDER URINARY SYSTEM 01/25/2010  . OTHER DYSPHAGIA 04/11/2009  . ESOPHAGEAL REFLUX 01/24/2009  . OTHER AND UNSPECIFIED HYPERLIPIDEMIA 11/23/2008  . Osteoporosis 11/23/2008  . NONSPECIFIC ABNORMAL ELECTROCARDIOGRAM 11/23/2008   PCP:  [name], NP Pharmacy:   [id number] - [place], [place] [address] Phone: [phone] Fax: [phone]     Social Determinants of Health (SDOH) Interventions    Readmission Risk Interventions No flowsheet data found.

## 2019-10-06 NOTE — Progress Notes (Signed)
Notified Baltazar Najjar of pt having 6 beat run of VT. No s/s of distress noted. Will continue to monitor.

## 2019-10-06 NOTE — Progress Notes (Addendum)
PROGRESS NOTE    Alicia Boyd  [id number] DOB: 06-Jan-1930 DOA: 10/03/2019 PCP: [name], NP  Brief Narrative: As per H&P:  85 y.o. female with medical history significant for hypertension, hyperlipidemia, and osteoporosis who presents to the ED for evaluation of shortness of breath and swelling in her legs. recent hospitalization 09/03/2019-09/09/2019 for a right hip fracture  with right hemiarthroplasty  (9/4)- discharged on aspirin 325 mg twice daily for 1 month for VTE prophylaxis to SNF--dc from SNF --> home alone  DOE when working with her therapist-SOB + exertional activity + swelling in both of her lower legs + infrequent cough without sputum production  - subjective fevers, chills, or diaphoresis.  + urinary frequency without dysuria.  - abdominal pain, diarrhea, or constipation.    + activity is limited due to her dyspnea.   presented to the ED 10/4 for further evaluation after discussing with her primary care provider.  ED Course: BP on higher side-180/94,SPO2 98% on room air. Labs stable except for potassium 3.2, bicarb 28,BNP 525.2, high-sensitivity troponin I 24 and 23.  Urinalysis was negative for UTI. SARS-CoV-2 test is negative.  Blood cultures were ordered.CXR -new moderate sized bilateral pleural effusions and mild bibasilar atelectasis with stable cardiomegaly and pulmonary vascular congestion. CTA chest PE study was negative for pulmonary embolism and showed small to moderate bilateral pleural effusions, cardiomegaly and three-vessel coronary artery calcifications were noted. Patient was ordered to receive IV vancomycin and aztreonam -- antibiotics were discontinued on admission and was placed on IV Lasix  Subjective: Awake alert coherent and in nad Overall feels less swollen in LE's - fever,-chills-cp -doe less swollen -n -vomit -diarr -dysuria  Assessment & Plan:   Bilateral pleural effusion with progressive dyspnea on exertion: No PE on CTA, BNP elevated  at 525 COVID-19 negative likely fluid overload/ diastolic chf Patient overall feels improving on diuresis, continue IV Lasix. Monitor intake output and daily weight.   Rpt echo 10/5 atrial enlargement-concern for Amyloid--Cardiology Dr. Stanford Breed feels can be worked up as OP c Cardiac MRI--will cc Trish to ensure this appt is made in 2 weeks chest x-ray 10/5 still shows pleural effusion--repeat CXR in am and 6 min walk test in am as well conisdering IR thoracentesis in 24-48 h if effusion is large  Essential hypertension: Blood pressure poorly controlled [id number] to [id number] systolic.  Consolidate to [place] xl 37.5, d/c amlodipine [can cause Le swelling] change IV Lasix 20 to 40 po bid -2.5 liters so far.  Hypokalemia: Potassium 3.5, monitor while on diuresis.  Nonsustained V. Tach: 4-5 beats on 10/5--monitor electrolytes.  Monitor on tele.  Increased metoprolol xl form 25 to 37.5--check am mag  Positive troponin, flat at 24-23, no chest pain likely in the setting of CHF.   Anemia NOS- likely from chronic disease hemoglobin at 9.7 g.  Monitor.  Status post right hemiarthroplasty on 9/4, continue PT OT.  Body mass index is 20.03 kg/m.    DVT prophylaxis: Heparin. Code Status: DNR. Family Communication:plan of care discussed with patient in detail. D/w Ms Georg Ruddle with pulmonary[ who is aware of possible d/c am should all continue to fare well wit patient   Disposition Plan: Remains inpatient pending further improvement in the pleural effusion--reepating CXR in am Appears to be stabilizing and if no furthe rissues nor sy,pt-likely can be ready for d/c in am Consultants: none  Procedures: Cx 10/04/19 r:"Small to moderate pleural effusions have increased since the most recent examination"  ECHO 10/5 IMPRESSIONS  1. Left ventricular ejection fraction, by visual estimation, is 50 to 55%. The left ventricle has normal function. There is moderately increased left ventricular hypertrophy.  2.  Left ventricular diastolic Doppler parameters are consistent with pseudonormalization pattern of LV diastolic filling.  3. Global right ventricle has normal systolic function.The right ventricular size is normal. No increase in right ventricular wall thickness.  4. Moderate pleural effusion.  5. The aortic valve is tricuspid Aortic valve regurgitation is mild to moderate by color flow Doppler.  6. Thickened interatrial septum measuring 15 mm  7. Left atrial size was severely dilated.  8. Normal pulmonary artery systolic pressure.  9. The inferior vena cava is normal in size with greater than 50% respiratory variability, suggesting right atrial pressure of 3 mmHg. 10. Given moderate LVH with significant atrial dilatation and interatrial septal thickening, would consider evaluation for cardiac amyloidosis. Would consider cardiac MRI if clinically indicated.   Microbiology:  Antimicrobials: Anti-infectives (From admission, onward)   Start     Dose/Rate Route Frequency Ordered Stop   10/03/19 2345  vancomycin (VANCOCIN) IVPB 1000 mg/200 mL premix  Status:  Discontinued     1,000 mg 200 mL/hr over 60 Minutes Intravenous  Once 10/03/19 2330 10/04/19 0016   10/03/19 2330  aztreonam (AZACTAM) 2 g in sodium chloride 0.9 % 100 mL IVPB  Status:  Discontinued     2 g 200 mL/hr over 30 Minutes Intravenous  Once 10/03/19 2321 10/04/19 0016       Objective: Vitals:   10/05/19 1305 10/05/19 2036 10/06/19 0445 10/06/19 1356  BP: (!) 146/64 130/61 (!) 157/65 (!) 144/67  Pulse: 78 83 73 77  Resp: 16 14 14 15   Temp: 98.7 F (37.1 C) 98.7 F (37.1 C) 98.2 F (36.8 C) 98.4 F (36.9 C)  TempSrc: Oral Oral Oral Oral  SpO2: 95% 96% 96% 96%  Weight:   54.6 kg   Height:        Intake/Output Summary (Last 24 hours) at 10/06/2019 1357 Last data filed at 10/06/2019 0559 Gross per 24 hour  Intake 60 ml  Output -  Net 60 ml   Filed Weights   10/04/19 0745 10/05/19 0420 10/06/19 0445  Weight: 57.9  kg 53.2 kg 54.6 kg   Weight change: -3.3 kg  Body mass index is 20.03 kg/m.  Intake/Output from previous day: 10/06 0701 - 10/07 0700 In: 60 [P.O.:60] Out: -  Intake/Output this shift: No intake/output data recorded.  Examination:  coherent in nad no ict no pallor  No jvd no carotid bruit cta b no added sound s1 s2 no m/r/g abd soft nt dn no rebound no guard Trace le edema  Medications:  Scheduled Meds: . feeding supplement (ENSURE ENLIVE)  237 mL Oral BID BM  . furosemide  40 mg Oral BID  . heparin  5,000 Units Subcutaneous Q8H  . metoprolol succinate  37.5 mg Oral Daily  . sodium chloride flush  3 mL Intravenous Q12H   Continuous Infusions:  Data Reviewed: I have personally reviewed following labs and imaging studies  CBC: Recent Labs  Lab 10/03/19 1930 10/04/19 0511  WBC 7.3 6.0  NEUTROABS 5.3  --   HGB 11.2* 9.7*  HCT 34.9* 30.2*  MCV 97.8 96.8  PLT 297 [id number]   Basic Metabolic Panel: Recent Labs  Lab 10/03/19 1930 10/03/19 2359 10/04/19 0511 10/05/19 0511 10/06/19 0440  NA 139  --  139 139 140  K 3.2*  --  3.6 3.5 4.1  CL  103  --  105 101 102  CO2 28  --  29 28 31   GLUCOSE 105*  --  116* 99 99  BUN 16  --  10 17 23   CREATININE 0.34*  --  0.35* 0.45 0.47  CALCIUM 9.1  --  8.6* 8.5* 8.7*  MG  --  2.0 1.9  --   --    GFR: Estimated Creatinine Clearance: 43.5 mL/min (by C-G formula based on SCr of 0.47 mg/dL). Liver Function Tests: Recent Labs  Lab 10/03/19 1930  AST 16  ALT 21  ALKPHOS 127*  BILITOT 0.6  PROT 6.1*  ALBUMIN 3.6   No results for input(s): LIPASE, AMYLASE in the last 168 hours. No results for input(s): AMMONIA in the last 168 hours. Coagulation Profile: No results for input(s): INR, PROTIME in the last 168 hours. Cardiac Enzymes: No results for input(s): CKTOTAL, CKMB, CKMBINDEX, TROPONINI in the last 168 hours. BNP (last 3 results) No results for input(s): PROBNP in the last 8760 hours. HbA1C: No results for  input(s): HGBA1C in the last 72 hours. CBG: No results for input(s): GLUCAP in the last 168 hours. Lipid Profile: No results for input(s): CHOL, HDL, LDLCALC, TRIG, CHOLHDL, LDLDIRECT in the last 72 hours. Thyroid Function Tests: Recent Labs    10/03/19 2344  TSH 2.526   Anemia Panel: No results for input(s): VITAMINB12, FOLATE, FERRITIN, TIBC, IRON, RETICCTPCT in the last 72 hours. Sepsis Labs: Recent Labs  Lab 10/03/19 2351  PROCALCITON <0.10    Recent Results (from the past 240 hour(s))  SARS Coronavirus 2 [facility] order, Performed in [facility] hospital lab) Nasopharyngeal Nasopharyngeal Swab     Status: None   Collection Time: 10/03/19  7:34 PM   Specimen: Nasopharyngeal Swab  Result Value Ref Range Status   SARS Coronavirus 2 NEGATIVE NEGATIVE Final    Comment: (NOTE) If result is NEGATIVE SARS-CoV-2 target nucleic acids are NOT DETECTED. The SARS-CoV-2 RNA is generally detectable in upper and lower  respiratory specimens during the acute phase of infection. The lowest  concentration of SARS-CoV-2 viral copies this assay can detect is 250  copies / mL. A negative result does not preclude SARS-CoV-2 infection  and should not be used as the sole basis for treatment or other  patient management decisions.  A negative result may occur with  improper specimen collection / handling, submission of specimen other  than nasopharyngeal swab, presence of viral mutation(s) within the  areas targeted by this assay, and inadequate number of viral copies  (<250 copies / mL). A negative result must be combined with clinical  observations, patient history, and epidemiological information. If result is POSITIVE SARS-CoV-2 target nucleic acids are DETECTED. The SARS-CoV-2 RNA is generally detectable in upper and lower  respiratory specimens dur ing the acute phase of infection.  Positive  results are indicative of active infection with SARS-CoV-2.  Clinical  correlation with  patient history and other diagnostic information is  necessary to determine patient infection status.  Positive results do  not rule out bacterial infection or co-infection with other viruses. If result is PRESUMPTIVE POSTIVE SARS-CoV-2 nucleic acids MAY BE PRESENT.   A presumptive positive result was obtained on the submitted specimen  and confirmed on repeat testing.  While 2019 novel coronavirus  (SARS-CoV-2) nucleic acids may be present in the submitted sample  additional confirmatory testing may be necessary for epidemiological  and / or clinical management purposes  to differentiate between  SARS-CoV-2 and other Sarbecovirus currently  known to infect humans.  If clinically indicated additional testing with an alternate test  methodology [phone]) is advised. The SARS-CoV-2 RNA is generally  detectable in upper and lower respiratory sp ecimens during the acute  phase of infection. The expected result is Negative. Fact Sheet for Patients:  StrictlyIdeas.no Fact Sheet for Healthcare Providers: BankingDealers.co.za This test is not yet approved or cleared by the [place] FDA and has been authorized for detection and/or diagnosis of SARS-CoV-2 by FDA under an Emergency Use Authorization (EUA).  This EUA will remain in effect (meaning this test can be used) for the duration of the COVID-19 declaration under Section 564(b)(1) of the Act, 21 U.S.C. section 360bbb-3(b)(1), unless the authorization is terminated or revoked sooner. Performed at [facility], [place] [address] [place], [place] [address]   Blood culture (routine x 2)     Status: None (Preliminary result)   Collection Time: 10/03/19 11:51 PM   Specimen: BLOOD  Result Value Ref Range Status   Specimen Description   Final    BLOOD LEFT ANTECUBITAL Performed at [place] [address], [place], [place] [address]    Special Requests    Final    BOTTLES DRAWN AEROBIC AND ANAEROBIC Blood Culture adequate volume Performed at [place] [address], [place], [place] [address]    Culture   Final    NO GROWTH 2 DAYS Performed at [place] [address] [place], [place] [address]    Report Status PENDING  Incomplete  Blood culture (routine x 2)     Status: None (Preliminary result)   Collection Time: 10/03/19 11:51 PM   Specimen: BLOOD  Result Value Ref Range Status   Specimen Description   Final    BLOOD RIGHT ANTECUBITAL Performed at [place] [address] [place], [place] [address]    Special Requests   Final    BOTTLES DRAWN AEROBIC AND ANAEROBIC Blood Culture adequate volume Performed at [place] [address], [place], [place] [address]    Culture   Final    NO GROWTH 2 DAYS Performed at [place] [address] [place], [place] [address]    Report Status PENDING  Incomplete      Radiology Studies: No results found.  LOS: 2 days   Time spent: More than 50% of that time was spent in counseling and/or coordination of care.  Nita Sells, MD [facility]  10/06/2019, 1:57 PM

## 2019-10-06 NOTE — Plan of Care (Signed)
  Problem: Health Behavior/Discharge Planning: ?Goal: Ability to manage health-related needs will improve ?Outcome: Progressing ?  ?Problem: Nutrition: ?Goal: Adequate nutrition will be maintained ?Outcome: Progressing ?  ?Problem: Pain Managment: ?Goal: General experience of comfort will improve ?Outcome: Progressing ?  ?Problem: Safety: ?Goal: Ability to remain free from injury will improve ?Outcome: Progressing ?  ?

## 2019-10-06 NOTE — TOC Progression Note (Signed)
Transition of Care [facility]) - Progression Note    Patient Details  Name: Alicia Boyd MRN: [id number] Date of Birth: [birth year]  Transition of Care [facility]) CM/SW Contact  Dilara Navarrete, Juliann Pulse, RN Phone Number: 10/06/2019, 3:19 PM  Clinical Narrative: patient qualifies for [facility] 1st program-rep Cory-HHRN/PT/aide aware of d/c likely in am unless Louie Casa can set up Private duty care tonight.      Expected Discharge Plan: [place] Barriers to Discharge: Continued Medical Work up  Expected Discharge Plan and Services Expected Discharge Plan: [place]   Discharge Planning Services: CM Consult Post Acute Care Choice: [place] arrangements for the past 2 months: [place]                           HH Arranged: RN, PT, Nurse's Aide [place] Agency: [place] home 1st program) Date [facility] Agency Contacted: 10/06/19 Time [place]: [phone] Representative spoke with at [place]: [place] ([place]) Interventions    Readmission Risk Interventions No flowsheet data found.

## 2019-10-07 ENCOUNTER — Other Ambulatory Visit: Payer: Self-pay | Admitting: *Deleted

## 2019-10-07 LAB — BASIC METABOLIC PANEL
Anion gap: 10 (ref 5–15)
BUN: 21 mg/dL (ref 8–23)
CO2: 30 mmol/L (ref 22–32)
Calcium: 8.6 mg/dL — ABNORMAL LOW (ref 8.9–10.3)
Chloride: 99 mmol/L (ref 98–111)
Creatinine, Ser: 0.44 mg/dL (ref 0.44–1.00)
GFR calc Af Amer: >60 mL/min (ref >60)
GFR calc non Af Amer: >60 mL/min (ref >60)
Glucose, Bld: 97 mg/dL (ref 70–99)
Potassium: 3.2 mmol/L — ABNORMAL LOW (ref 3.5–5.1)
Sodium: 139 mmol/L (ref 135–145)

## 2019-10-07 LAB — PHOSPHORUS: Phosphorus: 3.7 mg/dL (ref 2.5–4.6)

## 2019-10-07 LAB — ALBUMIN: Albumin: 2.8 g/dL — ABNORMAL LOW (ref 3.5–5.0)

## 2019-10-07 LAB — MAGNESIUM: Magnesium: 2 mg/dL (ref 1.7–2.4)

## 2019-10-07 MED ORDER — POTASSIUM CHLORIDE CRYS ER 20 MEQ PO TBCR
40.0000 meq | EXTENDED_RELEASE_TABLET | Freq: Every day | ORAL | Status: DC
  Administered 2019-10-07: 40 meq via ORAL
  Filled 2019-10-07: qty 2

## 2019-10-07 MED ORDER — FUROSEMIDE 40 MG PO TABS: 40.0000 mg | ORAL_TABLET | Freq: Two times a day (BID) | ORAL | 2 refills | Status: DC

## 2019-10-07 MED ORDER — POTASSIUM CHLORIDE CRYS ER 20 MEQ PO TBCR: 40.0000 meq | EXTENDED_RELEASE_TABLET | Freq: Every day | ORAL | 0 refills | Status: DC

## 2019-10-07 MED ORDER — FERROUS SULFATE 325 (65 FE) MG PO TABS: 325.0000 mg | ORAL_TABLET | ORAL | 3 refills | Status: DC

## 2019-10-07 MED ORDER — METOPROLOL SUCCINATE ER 25 MG PO TB24: 37.5000 mg | ORAL_TABLET | Freq: Every day | ORAL | 1 refills | Status: DC

## 2019-10-07 NOTE — Patient Outreach (Signed)
[facility]) Care Management  10/07/2019  Alicia Boyd [birth year] [id number]   Case Closure  Pt has discharged today with Un[facility] program and not longer eligible for [facility] services for care management services. Pt will be discharged and closed.  Raina Mina, RN Care Management Coordinator [employer] Office [phone]

## 2019-10-07 NOTE — TOC Transition Note (Signed)
Transition of Care [facility]) - CM/SW Discharge Note   Patient Details  Name: Alicia Boyd MRN: [id number] Date of Birth: May 16, 1930  Transition of Care [facility] CM/SW Contact:  Dessa Phi, RN Phone Number: 10/07/2019, 3:27 PM   Clinical Narrative: d/c home w/[facility]. Family able to provide additional support.Family will transport home on own. No further CM needs.      Final next level of care: [place] Barriers to Discharge: No Barriers Identified   Patient Goals and CMS Choice Patient states their goals for this hospitalization and ongoing recovery are:: go home CMS Medicare.gov Compare Post Acute Care list provided to:: Patient Represenative (must comment) Choice offered to / list presented to : Adult Children  Discharge Placement                       Discharge Plan and Services   Discharge Planning Services: CM Consult Post Acute Care Choice: Home Health                    HH Arranged: RN, PT, Nurse's Aide [facility] Agency: [place] home 1st program) Date [facility] Agency Contacted: 10/06/19 Time [place]: [phone] Representative spoke with at [place]: [place] ([place]) Interventions     Readmission Risk Interventions No flowsheet data found.

## 2019-10-07 NOTE — Care Management Important Message (Signed)
Important Message  Patient Details IM Letter given to Dessa Phi RN to present to the Patient Name: Alicia Boyd MRN: [id number] Date of Birth: [birth year]   Medicare Important Message Given:  Yes     Kerin Salen 10/07/2019, 9:57 AM

## 2019-10-07 NOTE — Discharge Summary (Signed)
Physician Discharge Summary  DAVEN HOCHMAN [id number] DOB: December 15, 1930 DOA: 10/03/2019  PCP: [name], NP  Admit date: 10/03/2019 Discharge date: 10/07/2019  Time spent: 35 minutes  Recommendations for Outpatient Follow-up:  1. Recommend Chem-12, CBC done with home health and sent to PCP for review to adjust potassium etc. etc. 2. Recommend outpatient cardiology follow-up-I have CCed Cardama started on this note to ensure that she is aware of need for scheduling for outpatient cardiac MRI rule out amyloidosis 3. New meds Lasix 40 twice daily to be titrated as outpatient also increased metoprolol this admit to 37.5 4. Recommend CXR 3 to 4 weeks rule out effusion  Discharge Diagnoses:  Principal Problem:   Bilateral pleural effusion Active Problems:   Essential hypertension   Hypokalemia   Discharge Condition: Improved  Diet recommendation: Heart healthy  Filed Weights   10/05/19 0420 10/06/19 0445 10/07/19 0500  Weight: 53.2 kg 54.6 kg 52.4 kg    History of present illness:  85 y.o.femalewith medical history significant forhypertension, hyperlipidemia, and osteoporosis who presents to the ED for evaluation of shortness of breath and swelling in her legs. recent hospitalization 09/03/2019-09/09/2019 for a right hip fracture  with right hemiarthroplasty  (9/4)- discharged on aspirin 325 mg twice daily for 1 month for VTE prophylaxis to SNF--dc from SNF --> home alone  DOE when working with her therapist-SOB + exertional activity + swelling in both of her lower legs + infrequent cough without sputum production - subjective fevers, chills, or diaphoresis. + urinary frequency without dysuria. - abdominal pain, diarrhea, or constipation.   + activity is limited due to her dyspnea.  presented to the ED 10/4 for further evaluation after discussing with her primary care provider.  Hospital Course:  Bilateral pleural effusion with progressive dyspnea on exertion: No PE on  CTA, BNP elevated at 525 COVID-19 negative likely fluid overload/ diastolic chf Rpt echo [id number] atrial enlargement-concern for Amyloid--Cardiology Dr. Stanford Breed feels can be worked up as OP c Cardiac MRI--will cc Trish to ensure this appt is made in 2 weeks chest x-ray 10/5 still shows pleural effusion--CXR my overview on discharge shows poor inspiratory effort compared to prior with mild effusions bilaterally no overt consolidation will need outpatient CXR 3 to 4 weeks  Essential hypertension: Blood pressure poorly controlled [id number] to [id number] systolic.  Consolidate to [place] xl 37.5, d/c amlodipine [can cause Le swelling] Patient overall feels improving on diuresis-initially was IV transition to 40 p.o. twice daily Lasix on discharge, -2.4 L at time of discharge  Hypokalemia: Potassium 3.2 on discharge needs repeat at home.  Sending home on potassium pills  Nonsustained V. Tach: 4-5 beats on 10/5--monitor electrolytes.  Monitor on tele.  Increased metoprolol xl form 25 to 37.5--magnesium 2.0 continue metoprolol  Positive troponin, flat at 24-23, no chest pain likely in the setting of CHF.   Anemia NOS- likely from chronic disease hemoglobin at 9.7 g.  Monitor.  Status post right hemiarthroplasty on 9/4, continue PT OT-  Asked home health to come out  Body mass index is 20.03 kg/m.   Outpatient supplements as PRN    Procedures:  Echocardiogram 10/5   Consultations:  Cardiology telephone consulted  Discharge Exam: Vitals:   10/07/19 0532 10/07/19 1313  BP: (!) 161/65 (!) 153/62  Pulse: 64 75  Resp: 18 15  Temp: 97.8 F (36.6 C) 98.1 F (36.7 C)  SpO2: 97% 100%    General: Awake alert coherent feels well slight dizzy with moving around however overall has  improved less short of breath not needing oxygen no chest pain no fever  Cardiovascular: S1-S2 no murmur rub or gallop Respiratory: Clinically clear no rales no rhonchi Abdomen soft nontender no rebound no  guarding  Discharge Instructions    Allergies as of 10/07/2019      Reactions   Amitid [amitriptyline Hcl]    Patient experienced a dizzy, spinning feeling    Metronidazole    REACTION: throat swells   Penicillins    REACTION: rash & fever   Alendronate Sodium    REACTION: unspecified   Clarithromycin    GI intolerance   Flonase [fluticasone Propionate]    Slight Headache    Promethazine Hcl    REACTION: unspecified   Robitussin (alcohol Free) [guaifenesin] Other (See Comments)   unknown hyperactive   Spironolactone    09/21/13 weakness in legs   Singulair [montelukast Sodium]    "spacey"; ineffective      Medication List    STOP taking these medications   aspirin EC 325 MG tablet   docusate sodium 100 MG capsule Commonly known as: COLACE   fexofenadine 180 MG tablet Commonly known as: ALLEGRA   methocarbamol 500 MG tablet Commonly known as: ROBAXIN   ondansetron 4 MG tablet Commonly known as: ZOFRAN     TAKE these medications   acetaminophen 500 MG tablet Commonly known as: TYLENOL Take 1,000 mg by mouth every 6 (six) hours as needed for moderate pain.   amLODipine 5 MG tablet Commonly known as: NORVASC TAKE 1 TABLET BY MOUTH EVERY DAY What changed: when to take this   feeding supplement (ENSURE ENLIVE) Liqd Take 237 mLs by mouth 2 (two) times daily between meals. What changed: when to take this   ferrous sulfate 325 (65 FE) MG tablet Take 1 tablet (325 mg total) by mouth every other day. What changed: when to take this   furosemide 40 MG tablet Commonly known as: LASIX Take 1 tablet (40 mg total) by mouth 2 (two) times daily.   metoprolol succinate 25 MG 24 hr tablet Commonly known as: TOPROL-XL Take 1.5 tablets (37.5 mg total) by mouth daily. Start taking on: October 08, 2019 What changed: how much to take   multivitamin tablet Take 1 tablet by mouth daily. chewable   polyethylene glycol 17 g packet Commonly known as: MIRALAX /  GLYCOLAX Take 17 g by mouth 2 (two) times daily. What changed:   when to take this  reasons to take this   potassium chloride SA 20 MEQ tablet Commonly known as: KLOR-CON Take 2 tablets (40 mEq total) by mouth daily. Start taking on: October 08, 2019   Vitamin D3 25 MCG (1000 UT) Caps Take 1 capsule by mouth daily.      Allergies  Allergen Reactions  . Amitid [Amitriptyline Hcl]     Patient experienced a dizzy, spinning feeling   . Metronidazole     REACTION: throat swells  . Penicillins     REACTION: rash & fever  . Alendronate Sodium     REACTION: unspecified  . Clarithromycin     GI intolerance  . Flonase [Fluticasone Propionate]     Slight Headache   . Promethazine Hcl     REACTION: unspecified  . Robitussin (Alcohol Free) [Guaifenesin] Other (See Comments)    unknown hyperactive  . Spironolactone     09/21/13 weakness in legs  . Singulair [Montelukast Sodium]     "spacey"; ineffective   Follow-up Information    Care, [facility]  Health Follow up.   Specialty: [place] Why: Home 1st program-HH nursing/physical therapy/aide Contact information: [address] [place] [address] [phone]            The results of significant diagnostics from this hospitalization (including imaging, microbiology, ancillary and laboratory) are listed below for reference.    Significant Diagnostic Studies: Dg Chest 2 View  Result Date: 10/06/2019 CLINICAL DATA:  History of heart failure and bilateral pleural effusions. EXAM: CHEST - 2 VIEW COMPARISON:  10/04/2019 FINDINGS: Cardiac shadow is mildly enlarged but stable. Aortic calcifications are again seen. Bilateral pleural effusions are noted left greater than right similar to that seen on the prior exam. Underlying atelectatic change is likely present. Mild central vascular congestion is noted. Calcified granuloma is noted in the right upper lobe. IMPRESSION: Stable changes of vascular  congestion with bilateral effusions and likely underlying basilar atelectasis/infiltrate. Electronically Signed   By: Inez Catalina M.D.   On: 10/06/2019 16:54   Dg Chest 2 View  Result Date: 10/04/2019 CLINICAL DATA:  Increasing lower extremity edema and shortness of breath. Cough. EXAM: CHEST - 2 VIEW COMPARISON:  PA and lateral chest 10/03/2019.  CT chest 10/03/2019. FINDINGS: Small to moderate bilateral pleural effusions are increased since the most recent comparison plain films. There is cardiomegaly and mild interstitial edema. No pneumothorax. Aortic atherosclerosis noted. No acute or focal bony abnormality. IMPRESSION: Small to moderate pleural effusions have increased since the most recent examination. No notable change in cardiomegaly and mild interstitial edema. Electronically Signed   By: Inge Rise M.D.   On: 10/04/2019 09:10   Dg Chest 2 View  Result Date: 10/03/2019 CLINICAL DATA:  Shortness of breath since right hip surgery 4 weeks ago. Bilateral lower extremity swelling. Fever. EXAM: CHEST - 2 VIEW COMPARISON:  05/05/2019 FINDINGS: Decreased inspiration. No gross change in mild enlargement of the cardiac silhouette and prominence of the pulmonary vasculature. Interval moderate-sized bilateral pleural effusions and mild bibasilar patchy and linear density. Diffuse osteopenia. IMPRESSION: 1. Interval moderate-sized bilateral pleural effusions and mild bibasilar atelectasis and possible pneumonia. 2. Stable cardiomegaly and pulmonary vascular congestion. Electronically Signed   By: Claudie Revering M.D.   On: 10/03/2019 19:26   Ct Angio Chest Pe W And/or Wo Contrast  Result Date: 10/03/2019 CLINICAL DATA:  Right hip surgery, shortness of breath EXAM: CT ANGIOGRAPHY CHEST WITH CONTRAST TECHNIQUE: Multidetector CT imaging of the chest was performed using the standard protocol during bolus administration of intravenous contrast. Multiplanar CT image reconstructions and MIPs were obtained to  evaluate the vascular anatomy. CONTRAST:  129mL OMNIPAQUE IOHEXOL 350 MG/ML SOLN COMPARISON:  None. FINDINGS: Cardiovascular: Satisfactory opacification of the pulmonary arteries to the segmental level. No evidence of pulmonary embolism. Cardiomegaly. Three-vessel coronary artery calcifications. No pericardial effusion. Aortic atherosclerosis. Mediastinum/Nodes: No enlarged mediastinal, hilar, or axillary lymph nodes. Calcified right hilar lymph nodes. Thyroid gland, trachea, and esophagus demonstrate no significant findings. Lungs/Pleura: Small to moderate bilateral pleural effusions and associated atelectasis or consolidation. Mild interlobular septal thickening (series 10, image 18). Benign calcified pulmonary nodule of the right upper lobe. Upper Abdomen: No acute abnormality. Musculoskeletal: No chest wall abnormality. No acute or significant osseous findings. Benign, trabeculated hemangioma of T6 vertebral body. Review of the MIP images confirms the above findings. IMPRESSION: 1.  Negative examination for pulmonary embolism. 2. Small to moderate bilateral pleural effusions and associated atelectasis or consolidation. Mild interlobular septal thickening (series 10, image 18). Findings are consistent with pulmonary edema. 3.  Cardiomegaly and three-vessel coronary artery calcifications. 4.  Aortic Atherosclerosis (ICD10-I70.0). Electronically Signed   By: Eddie Candle M.D.   On: 10/03/2019 21:02    Microbiology: Recent Results (from the past 240 hour(s))  SARS Coronavirus 2 [facility] order, Performed in [facility] hospital lab) Nasopharyngeal Nasopharyngeal Swab     Status: None   Collection Time: 10/03/19  7:34 PM   Specimen: Nasopharyngeal Swab  Result Value Ref Range Status   SARS Coronavirus 2 NEGATIVE NEGATIVE Final    Comment: (NOTE) If result is NEGATIVE SARS-CoV-2 target nucleic acids are NOT DETECTED. The SARS-CoV-2 RNA is generally detectable in upper and lower  respiratory specimens  during the acute phase of infection. The lowest  concentration of SARS-CoV-2 viral copies this assay can detect is 250  copies / mL. A negative result does not preclude SARS-CoV-2 infection  and should not be used as the sole basis for treatment or other  patient management decisions.  A negative result may occur with  improper specimen collection / handling, submission of specimen other  than nasopharyngeal swab, presence of viral mutation(s) within the  areas targeted by this assay, and inadequate number of viral copies  (<250 copies / mL). A negative result must be combined with clinical  observations, patient history, and epidemiological information. If result is POSITIVE SARS-CoV-2 target nucleic acids are DETECTED. The SARS-CoV-2 RNA is generally detectable in upper and lower  respiratory specimens dur ing the acute phase of infection.  Positive  results are indicative of active infection with SARS-CoV-2.  Clinical  correlation with patient history and other diagnostic information is  necessary to determine patient infection status.  Positive results do  not rule out bacterial infection or co-infection with other viruses. If result is PRESUMPTIVE POSTIVE SARS-CoV-2 nucleic acids MAY BE PRESENT.   A presumptive positive result was obtained on the submitted specimen  and confirmed on repeat testing.  While 2019 novel coronavirus  (SARS-CoV-2) nucleic acids may be present in the submitted sample  additional confirmatory testing may be necessary for epidemiological  and / or clinical management purposes  to differentiate between  SARS-CoV-2 and other Sarbecovirus currently known to infect humans.  If clinically indicated additional testing with an alternate test  methodology [phone]) is advised. The SARS-CoV-2 RNA is generally  detectable in upper and lower respiratory sp ecimens during the acute  phase of infection. The expected result is Negative. Fact Sheet for Patients:   StrictlyIdeas.no Fact Sheet for Healthcare Providers: BankingDealers.co.za This test is not yet approved or cleared by the [place] FDA and has been authorized for detection and/or diagnosis of SARS-CoV-2 by FDA under an Emergency Use Authorization (EUA).  This EUA will remain in effect (meaning this test can be used) for the duration of the COVID-19 declaration under Section 564(b)(1) of the Act, 21 U.S.C. section 360bbb-3(b)(1), unless the authorization is terminated or revoked sooner. Performed at [facility], [place] [address] [place], [place] [address]   Blood culture (routine x 2)     Status: None (Preliminary result)   Collection Time: 10/03/19 11:51 PM   Specimen: BLOOD  Result Value Ref Range Status   Specimen Description   Final    BLOOD LEFT ANTECUBITAL Performed at [place] [address], [place], [place] [address]    Special Requests   Final    BOTTLES DRAWN AEROBIC AND ANAEROBIC Blood Culture adequate volume Performed at [place] [address], [place], [place] [address]  Culture   Final    NO GROWTH 3 DAYS Performed at [facility], [place] [address] [place], [place] [address]    Report Status PENDING  Incomplete  Blood culture (routine x 2)     Status: None (Preliminary result)   Collection Time: 10/03/19 11:51 PM   Specimen: BLOOD  Result Value Ref Range Status   Specimen Description   Final    BLOOD RIGHT ANTECUBITAL Performed at [place] [address], [place], [place] [address]    Special Requests   Final    BOTTLES DRAWN AEROBIC AND ANAEROBIC Blood Culture adequate volume Performed at [place] [address] [place], [place] [address]    Culture   Final    NO GROWTH 3 DAYS Performed at [facility], [place] [address], [place], [place] [address]    Report Status PENDING   Incomplete     Labs: Basic Metabolic Panel: Recent Labs  Lab 10/03/19 1930 10/03/19 2359 10/04/19 0511 10/05/19 0511 10/06/19 0440 10/07/19 0442  NA 139  --  139 139 140 139  K 3.2*  --  3.6 3.5 4.1 3.2*  CL 103  --  105 101 102 99  CO2 28  --  29 28 31 30   GLUCOSE 105*  --  116* 99 99 97  BUN 16  --  10 17 23 21   CREATININE 0.34*  --  0.35* 0.45 0.47 0.44  CALCIUM 9.1  --  8.6* 8.5* 8.7* 8.6*  MG  --  2.0 1.9  --   --  2.0  PHOS  --   --   --   --   --  3.7   Liver Function Tests: Recent Labs  Lab 10/03/19 1930 10/07/19 0442  AST 16  --   ALT 21  --   ALKPHOS 127*  --   BILITOT 0.6  --   PROT 6.1*  --   ALBUMIN 3.6 2.8*   No results for input(s): LIPASE, AMYLASE in the last 168 hours. No results for input(s): AMMONIA in the last 168 hours. CBC: Recent Labs  Lab 10/03/19 1930 10/04/19 0511  WBC 7.3 6.0  NEUTROABS 5.3  --   HGB 11.2* 9.7*  HCT 34.9* 30.2*  MCV 97.8 96.8  PLT 297 268   Cardiac Enzymes: No results for input(s): CKTOTAL, CKMB, CKMBINDEX, TROPONINI in the last 168 hours. BNP: BNP (last 3 results) Recent Labs    10/03/19 1928  BNP 525.2*    ProBNP (last 3 results) No results for input(s): PROBNP in the last 8760 hours.  CBG: No results for input(s): GLUCAP in the last 168 hours.     Signed:  Nita Sells MD   [facility] 10/07/2019, 1:43 PM

## 2019-10-09 DIAGNOSIS — F329 Major depressive disorder, single episode, unspecified: Secondary | ICD-10-CM | POA: Diagnosis not present

## 2019-10-09 DIAGNOSIS — K219 Gastro-esophageal reflux disease without esophagitis: Secondary | ICD-10-CM | POA: Diagnosis not present

## 2019-10-09 DIAGNOSIS — G5601 Carpal tunnel syndrome, right upper limb: Secondary | ICD-10-CM | POA: Diagnosis not present

## 2019-10-09 DIAGNOSIS — K5909 Other constipation: Secondary | ICD-10-CM | POA: Diagnosis not present

## 2019-10-09 DIAGNOSIS — M1712 Unilateral primary osteoarthritis, left knee: Secondary | ICD-10-CM | POA: Diagnosis not present

## 2019-10-09 DIAGNOSIS — E785 Hyperlipidemia, unspecified: Secondary | ICD-10-CM | POA: Diagnosis not present

## 2019-10-09 DIAGNOSIS — J309 Allergic rhinitis, unspecified: Secondary | ICD-10-CM | POA: Diagnosis not present

## 2019-10-09 DIAGNOSIS — I2510 Atherosclerotic heart disease of native coronary artery without angina pectoris: Secondary | ICD-10-CM | POA: Diagnosis not present

## 2019-10-09 DIAGNOSIS — H269 Unspecified cataract: Secondary | ICD-10-CM | POA: Diagnosis not present

## 2019-10-09 DIAGNOSIS — I0981 Rheumatic heart failure: Secondary | ICD-10-CM | POA: Diagnosis not present

## 2019-10-09 DIAGNOSIS — I110 Hypertensive heart disease with heart failure: Secondary | ICD-10-CM | POA: Diagnosis not present

## 2019-10-09 DIAGNOSIS — H35033 Hypertensive retinopathy, bilateral: Secondary | ICD-10-CM | POA: Diagnosis not present

## 2019-10-09 DIAGNOSIS — R1319 Other dysphagia: Secondary | ICD-10-CM | POA: Diagnosis not present

## 2019-10-09 DIAGNOSIS — I8310 Varicose veins of unspecified lower extremity with inflammation: Secondary | ICD-10-CM | POA: Diagnosis not present

## 2019-10-09 DIAGNOSIS — F419 Anxiety disorder, unspecified: Secondary | ICD-10-CM | POA: Diagnosis not present

## 2019-10-09 DIAGNOSIS — D630 Anemia in neoplastic disease: Secondary | ICD-10-CM | POA: Diagnosis not present

## 2019-10-09 DIAGNOSIS — J8410 Pulmonary fibrosis, unspecified: Secondary | ICD-10-CM | POA: Diagnosis not present

## 2019-10-09 DIAGNOSIS — M80051D Age-related osteoporosis with current pathological fracture, right femur, subsequent encounter for fracture with routine healing: Secondary | ICD-10-CM | POA: Diagnosis not present

## 2019-10-09 DIAGNOSIS — E440 Moderate protein-calorie malnutrition: Secondary | ICD-10-CM | POA: Diagnosis not present

## 2019-10-09 DIAGNOSIS — I5030 Unspecified diastolic (congestive) heart failure: Secondary | ICD-10-CM | POA: Diagnosis not present

## 2019-10-09 DIAGNOSIS — G43109 Migraine with aura, not intractable, without status migrainosus: Secondary | ICD-10-CM | POA: Diagnosis not present

## 2019-10-09 DIAGNOSIS — D1809 Hemangioma of other sites: Secondary | ICD-10-CM | POA: Diagnosis not present

## 2019-10-09 DIAGNOSIS — M8580 Other specified disorders of bone density and structure, unspecified site: Secondary | ICD-10-CM | POA: Diagnosis not present

## 2019-10-09 DIAGNOSIS — I700 Atherosclerosis of aorta: Secondary | ICD-10-CM | POA: Diagnosis not present

## 2019-10-09 DIAGNOSIS — I082 Rheumatic disorders of both aortic and tricuspid valves: Secondary | ICD-10-CM | POA: Diagnosis not present

## 2019-10-09 LAB — CULTURE, BLOOD (ROUTINE X 2)
Culture: NO GROWTH
Culture: NO GROWTH
Special Requests: ADEQUATE
Special Requests: ADEQUATE

## 2019-10-10 DIAGNOSIS — I110 Hypertensive heart disease with heart failure: Secondary | ICD-10-CM | POA: Diagnosis not present

## 2019-10-10 DIAGNOSIS — I2510 Atherosclerotic heart disease of native coronary artery without angina pectoris: Secondary | ICD-10-CM | POA: Diagnosis not present

## 2019-10-10 DIAGNOSIS — I0981 Rheumatic heart failure: Secondary | ICD-10-CM | POA: Diagnosis not present

## 2019-10-10 DIAGNOSIS — I5030 Unspecified diastolic (congestive) heart failure: Secondary | ICD-10-CM | POA: Diagnosis not present

## 2019-10-10 DIAGNOSIS — I082 Rheumatic disorders of both aortic and tricuspid valves: Secondary | ICD-10-CM | POA: Diagnosis not present

## 2019-10-10 DIAGNOSIS — M80051D Age-related osteoporosis with current pathological fracture, right femur, subsequent encounter for fracture with routine healing: Secondary | ICD-10-CM | POA: Diagnosis not present

## 2019-10-11 ENCOUNTER — Telehealth: Payer: Self-pay | Admitting: Adult Health

## 2019-10-11 NOTE — Telephone Encounter (Signed)
Caller/Agency: [place] Number: [phone] [facility] to leave Verbal on VM Requesting OT/PT/Skilled Nursing/Social Work/Speech Therapy: Skilled Nursing/Disease Management, Medication Management Teaching Frequency: 1 week 8, For 8 week episode

## 2019-10-12 DIAGNOSIS — M25551 Pain in right hip: Secondary | ICD-10-CM | POA: Diagnosis not present

## 2019-10-12 NOTE — Telephone Encounter (Signed)
Lori notified to proceed with orders.

## 2019-10-12 NOTE — Telephone Encounter (Signed)
Left a message for a return call.

## 2019-10-13 DIAGNOSIS — I2510 Atherosclerotic heart disease of native coronary artery without angina pectoris: Secondary | ICD-10-CM | POA: Diagnosis not present

## 2019-10-13 DIAGNOSIS — I110 Hypertensive heart disease with heart failure: Secondary | ICD-10-CM | POA: Diagnosis not present

## 2019-10-13 DIAGNOSIS — M80051D Age-related osteoporosis with current pathological fracture, right femur, subsequent encounter for fracture with routine healing: Secondary | ICD-10-CM | POA: Diagnosis not present

## 2019-10-13 DIAGNOSIS — I5030 Unspecified diastolic (congestive) heart failure: Secondary | ICD-10-CM | POA: Diagnosis not present

## 2019-10-13 DIAGNOSIS — I082 Rheumatic disorders of both aortic and tricuspid valves: Secondary | ICD-10-CM | POA: Diagnosis not present

## 2019-10-13 DIAGNOSIS — I0981 Rheumatic heart failure: Secondary | ICD-10-CM | POA: Diagnosis not present

## 2019-10-14 DIAGNOSIS — I082 Rheumatic disorders of both aortic and tricuspid valves: Secondary | ICD-10-CM | POA: Diagnosis not present

## 2019-10-14 DIAGNOSIS — I0981 Rheumatic heart failure: Secondary | ICD-10-CM | POA: Diagnosis not present

## 2019-10-14 DIAGNOSIS — I2510 Atherosclerotic heart disease of native coronary artery without angina pectoris: Secondary | ICD-10-CM | POA: Diagnosis not present

## 2019-10-14 DIAGNOSIS — I110 Hypertensive heart disease with heart failure: Secondary | ICD-10-CM | POA: Diagnosis not present

## 2019-10-14 DIAGNOSIS — M80051D Age-related osteoporosis with current pathological fracture, right femur, subsequent encounter for fracture with routine healing: Secondary | ICD-10-CM | POA: Diagnosis not present

## 2019-10-14 DIAGNOSIS — I5030 Unspecified diastolic (congestive) heart failure: Secondary | ICD-10-CM | POA: Diagnosis not present

## 2019-10-18 DIAGNOSIS — I0981 Rheumatic heart failure: Secondary | ICD-10-CM | POA: Diagnosis not present

## 2019-10-18 DIAGNOSIS — I2510 Atherosclerotic heart disease of native coronary artery without angina pectoris: Secondary | ICD-10-CM | POA: Diagnosis not present

## 2019-10-18 DIAGNOSIS — I082 Rheumatic disorders of both aortic and tricuspid valves: Secondary | ICD-10-CM | POA: Diagnosis not present

## 2019-10-18 DIAGNOSIS — M80051D Age-related osteoporosis with current pathological fracture, right femur, subsequent encounter for fracture with routine healing: Secondary | ICD-10-CM | POA: Diagnosis not present

## 2019-10-18 DIAGNOSIS — I5030 Unspecified diastolic (congestive) heart failure: Secondary | ICD-10-CM | POA: Diagnosis not present

## 2019-10-18 DIAGNOSIS — I110 Hypertensive heart disease with heart failure: Secondary | ICD-10-CM | POA: Diagnosis not present

## 2019-10-19 ENCOUNTER — Ambulatory Visit: Payer: MEDICARE | Admitting: Cardiology

## 2019-10-20 DIAGNOSIS — I110 Hypertensive heart disease with heart failure: Secondary | ICD-10-CM | POA: Diagnosis not present

## 2019-10-20 DIAGNOSIS — M80051D Age-related osteoporosis with current pathological fracture, right femur, subsequent encounter for fracture with routine healing: Secondary | ICD-10-CM | POA: Diagnosis not present

## 2019-10-20 DIAGNOSIS — I5030 Unspecified diastolic (congestive) heart failure: Secondary | ICD-10-CM | POA: Diagnosis not present

## 2019-10-20 DIAGNOSIS — I082 Rheumatic disorders of both aortic and tricuspid valves: Secondary | ICD-10-CM | POA: Diagnosis not present

## 2019-10-20 DIAGNOSIS — I2510 Atherosclerotic heart disease of native coronary artery without angina pectoris: Secondary | ICD-10-CM | POA: Diagnosis not present

## 2019-10-20 DIAGNOSIS — I0981 Rheumatic heart failure: Secondary | ICD-10-CM | POA: Diagnosis not present

## 2019-10-21 ENCOUNTER — Other Ambulatory Visit: Payer: Self-pay | Admitting: Adult Health

## 2019-10-21 DIAGNOSIS — I110 Hypertensive heart disease with heart failure: Secondary | ICD-10-CM | POA: Diagnosis not present

## 2019-10-21 DIAGNOSIS — I082 Rheumatic disorders of both aortic and tricuspid valves: Secondary | ICD-10-CM | POA: Diagnosis not present

## 2019-10-21 DIAGNOSIS — I5030 Unspecified diastolic (congestive) heart failure: Secondary | ICD-10-CM | POA: Diagnosis not present

## 2019-10-21 DIAGNOSIS — I0981 Rheumatic heart failure: Secondary | ICD-10-CM | POA: Diagnosis not present

## 2019-10-21 DIAGNOSIS — M80051D Age-related osteoporosis with current pathological fracture, right femur, subsequent encounter for fracture with routine healing: Secondary | ICD-10-CM | POA: Diagnosis not present

## 2019-10-21 DIAGNOSIS — I2510 Atherosclerotic heart disease of native coronary artery without angina pectoris: Secondary | ICD-10-CM | POA: Diagnosis not present

## 2019-10-21 MED ORDER — POTASSIUM CHLORIDE CRYS ER 20 MEQ PO TBCR: 40.0000 meq | EXTENDED_RELEASE_TABLET | Freq: Every day | ORAL | 0 refills | Status: DC

## 2019-10-21 MED ORDER — FUROSEMIDE 40 MG PO TABS: 40.0000 mg | ORAL_TABLET | Freq: Two times a day (BID) | ORAL | 0 refills | Status: DC

## 2019-10-21 NOTE — Telephone Encounter (Signed)
Requested medication (s) are due for refill today: yes  Requested medication (s) are on the active medication list: yes  Last refill:  10/03/2019  Future visit scheduled: no  Notes to clinic:  Medication: potassium chloride SA (KLOR-CON) 20 MEQ tablet/furosemide (LASIX) 40 MG tablet/Pt was prescribed klor-con and lasix while in the hospital. She would like to know if [name] would refill both. Stated she has enough Lasix through tomorrow. Please advise.    Requested Prescriptions  Pending Prescriptions Disp Refills   potassium chloride SA (KLOR-CON) 20 MEQ tablet 20 tablet 0    Sig: Take 2 tablets (40 mEq total) by mouth daily.     Endocrinology:  Minerals - Potassium Supplementation Failed - 10/21/2019  9:54 AM      Failed - K in normal range and within 360 days    Potassium  Date Value Ref Range Status  10/07/2019 3.2 (L) 3.5 - 5.1 mmol/L Final    Comment:    DELTA CHECK NOTED         Passed - Cr in normal range and within 360 days    Creat  Date Value Ref Range Status  09/10/2013 0.57 0.50 - 1.10 mg/dL Final   Creatinine, Ser  Date Value Ref Range Status  10/07/2019 0.44 0.44 - 1.00 mg/dL Final         Passed - Valid encounter within last 12 months    Recent Outpatient Visits          1 month ago Right leg pain   [place], [place], DO   3 months ago Closed fracture of multiple pubic rami, right, initial encounter [facility])   [place], [place], DO   3 months ago Closed fracture of multiple pubic rami, right, initial encounter [facility])   [place], Olevia Bowens, DO   3 months ago Anxiety and depression   Therapist, music at [employer], [place], NP   4 months ago Left hip pain   [place], Olevia Bowens, DO      Future Appointments            In 3 weeks Crenshaw, Denice Bors, MD [facility], [facility]            furosemide (LASIX) 40 MG tablet 30 tablet 2    Sig: Take 1 tablet (40 mg total) by mouth 2 (two) times daily.     Cardiovascular:  Diuretics - Loop Failed - 10/21/2019  9:54 AM      Failed - K in normal range and within 360 days    Potassium  Date Value Ref Range Status  10/07/2019 3.2 (L) 3.5 - 5.1 mmol/L Final    Comment:    DELTA CHECK NOTED         Failed - Ca in normal range and within 360 days    Calcium  Date Value Ref Range Status  10/07/2019 8.6 (L) 8.9 - 10.3 mg/dL Final         Failed - Last BP in normal range    BP Readings from Last 1 Encounters:  10/07/19 (!) 153/62         Passed - Na in normal range and within 360 days    Sodium  Date Value Ref Range Status  10/07/2019 139 135 - 145 mmol/L Final         Passed - Cr in normal range and within  360 days    Creat  Date Value Ref Range Status  09/10/2013 0.57 0.50 - 1.10 mg/dL Final   Creatinine, Ser  Date Value Ref Range Status  10/07/2019 0.44 0.44 - 1.00 mg/dL Final         Passed - Valid encounter within last 6 months    Recent Outpatient Visits          1 month ago Right leg pain   [place], [place], DO   3 months ago Closed fracture of multiple pubic rami, right, initial encounter [facility])   [place], [place], DO   3 months ago Closed fracture of multiple pubic rami, right, initial encounter [facility])   [place], Olevia Bowens, DO   3 months ago Anxiety and depression   Therapist, music at [employer], [place], NP   4 months ago Left hip pain   [place], [place], DO      Future Appointments            In 3 weeks Crenshaw, Denice Bors, MD [facility], [facility]

## 2019-10-21 NOTE — Telephone Encounter (Signed)
Medication Refill - Medication: potassium chloride SA (KLOR-CON) 20 MEQ tablet/furosemide (LASIX) 40 MG tablet/Pt was prescribed klor-con and lasix while in the hospital. She would like to know if [name] would refill both. Stated she has enough Lasix through tomorrow. Please advise.  Has the patient contacted their pharmacy? Yes.   (Agent: If no, request that the patient contact the pharmacy for the refill.) (Agent: If yes, when and what did the pharmacy advise?)  Preferred Pharmacy (with phone number or street name):  [id number] - [place], [place] - [address] [phone] (Phone) [phone] (Fax)     Agent: Please be advised that RX refills may take up to 3 business days. We ask that you follow-up with your pharmacy.

## 2019-10-25 DIAGNOSIS — I5030 Unspecified diastolic (congestive) heart failure: Secondary | ICD-10-CM | POA: Diagnosis not present

## 2019-10-25 DIAGNOSIS — I082 Rheumatic disorders of both aortic and tricuspid valves: Secondary | ICD-10-CM | POA: Diagnosis not present

## 2019-10-25 DIAGNOSIS — I2510 Atherosclerotic heart disease of native coronary artery without angina pectoris: Secondary | ICD-10-CM | POA: Diagnosis not present

## 2019-10-25 DIAGNOSIS — I0981 Rheumatic heart failure: Secondary | ICD-10-CM | POA: Diagnosis not present

## 2019-10-25 DIAGNOSIS — I110 Hypertensive heart disease with heart failure: Secondary | ICD-10-CM | POA: Diagnosis not present

## 2019-10-25 DIAGNOSIS — M80051D Age-related osteoporosis with current pathological fracture, right femur, subsequent encounter for fracture with routine healing: Secondary | ICD-10-CM | POA: Diagnosis not present

## 2019-10-26 DIAGNOSIS — I0981 Rheumatic heart failure: Secondary | ICD-10-CM | POA: Diagnosis not present

## 2019-10-26 DIAGNOSIS — I5030 Unspecified diastolic (congestive) heart failure: Secondary | ICD-10-CM | POA: Diagnosis not present

## 2019-10-26 DIAGNOSIS — I082 Rheumatic disorders of both aortic and tricuspid valves: Secondary | ICD-10-CM | POA: Diagnosis not present

## 2019-10-26 DIAGNOSIS — M80051D Age-related osteoporosis with current pathological fracture, right femur, subsequent encounter for fracture with routine healing: Secondary | ICD-10-CM | POA: Diagnosis not present

## 2019-10-26 DIAGNOSIS — I2510 Atherosclerotic heart disease of native coronary artery without angina pectoris: Secondary | ICD-10-CM | POA: Diagnosis not present

## 2019-10-26 DIAGNOSIS — I110 Hypertensive heart disease with heart failure: Secondary | ICD-10-CM | POA: Diagnosis not present

## 2019-10-27 DIAGNOSIS — I0981 Rheumatic heart failure: Secondary | ICD-10-CM | POA: Diagnosis not present

## 2019-10-27 DIAGNOSIS — M80051D Age-related osteoporosis with current pathological fracture, right femur, subsequent encounter for fracture with routine healing: Secondary | ICD-10-CM | POA: Diagnosis not present

## 2019-10-27 DIAGNOSIS — I2510 Atherosclerotic heart disease of native coronary artery without angina pectoris: Secondary | ICD-10-CM | POA: Diagnosis not present

## 2019-10-27 DIAGNOSIS — I082 Rheumatic disorders of both aortic and tricuspid valves: Secondary | ICD-10-CM | POA: Diagnosis not present

## 2019-10-27 DIAGNOSIS — I5030 Unspecified diastolic (congestive) heart failure: Secondary | ICD-10-CM | POA: Diagnosis not present

## 2019-10-27 DIAGNOSIS — I110 Hypertensive heart disease with heart failure: Secondary | ICD-10-CM | POA: Diagnosis not present

## 2019-10-28 ENCOUNTER — Telehealth: Payer: Self-pay

## 2019-10-28 DIAGNOSIS — I082 Rheumatic disorders of both aortic and tricuspid valves: Secondary | ICD-10-CM | POA: Diagnosis not present

## 2019-10-28 DIAGNOSIS — M80051D Age-related osteoporosis with current pathological fracture, right femur, subsequent encounter for fracture with routine healing: Secondary | ICD-10-CM | POA: Diagnosis not present

## 2019-10-28 DIAGNOSIS — I110 Hypertensive heart disease with heart failure: Secondary | ICD-10-CM | POA: Diagnosis not present

## 2019-10-28 DIAGNOSIS — I5030 Unspecified diastolic (congestive) heart failure: Secondary | ICD-10-CM | POA: Diagnosis not present

## 2019-10-28 DIAGNOSIS — I0981 Rheumatic heart failure: Secondary | ICD-10-CM | POA: Diagnosis not present

## 2019-10-28 DIAGNOSIS — I2510 Atherosclerotic heart disease of native coronary artery without angina pectoris: Secondary | ICD-10-CM | POA: Diagnosis not present

## 2019-10-28 NOTE — Telephone Encounter (Signed)
Verbal order given to Manus Gunning for palliative care.  Nothing further needed.

## 2019-10-28 NOTE — Telephone Encounter (Signed)
Copied from [place] [id number]. Topic: General - Other >> Oct 28, 2019 10:49 AM Keene Breath wrote: Reason for CRM: Called to speak with the nurse or doctor about getting her service with [facility].  CB# [phone].  Either speak with Arville Go or Ronnell Guadalajara.

## 2019-11-02 DIAGNOSIS — I5030 Unspecified diastolic (congestive) heart failure: Secondary | ICD-10-CM | POA: Diagnosis not present

## 2019-11-02 DIAGNOSIS — I2510 Atherosclerotic heart disease of native coronary artery without angina pectoris: Secondary | ICD-10-CM | POA: Diagnosis not present

## 2019-11-02 DIAGNOSIS — I110 Hypertensive heart disease with heart failure: Secondary | ICD-10-CM | POA: Diagnosis not present

## 2019-11-02 DIAGNOSIS — I0981 Rheumatic heart failure: Secondary | ICD-10-CM | POA: Diagnosis not present

## 2019-11-02 DIAGNOSIS — M80051D Age-related osteoporosis with current pathological fracture, right femur, subsequent encounter for fracture with routine healing: Secondary | ICD-10-CM | POA: Diagnosis not present

## 2019-11-02 DIAGNOSIS — I082 Rheumatic disorders of both aortic and tricuspid valves: Secondary | ICD-10-CM | POA: Diagnosis not present

## 2019-11-03 ENCOUNTER — Encounter: Payer: Self-pay | Admitting: Adult Health

## 2019-11-03 ENCOUNTER — Other Ambulatory Visit: Payer: Self-pay

## 2019-11-03 ENCOUNTER — Ambulatory Visit (INDEPENDENT_AMBULATORY_CARE_PROVIDER_SITE_OTHER): Payer: MEDICARE | Admitting: Adult Health

## 2019-11-03 ENCOUNTER — Ambulatory Visit (INDEPENDENT_AMBULATORY_CARE_PROVIDER_SITE_OTHER): Payer: MEDICARE

## 2019-11-03 VITALS — BP 144/78 | HR 79 | Temp 98.3°F | Wt 118.6 lb

## 2019-11-03 DIAGNOSIS — D649 Anemia, unspecified: Secondary | ICD-10-CM

## 2019-11-03 DIAGNOSIS — E876 Hypokalemia: Secondary | ICD-10-CM

## 2019-11-03 DIAGNOSIS — J90 Pleural effusion, not elsewhere classified: Secondary | ICD-10-CM | POA: Diagnosis not present

## 2019-11-03 DIAGNOSIS — I10 Essential (primary) hypertension: Secondary | ICD-10-CM | POA: Diagnosis not present

## 2019-11-03 DIAGNOSIS — R600 Localized edema: Secondary | ICD-10-CM | POA: Diagnosis not present

## 2019-11-03 DIAGNOSIS — E440 Moderate protein-calorie malnutrition: Secondary | ICD-10-CM | POA: Diagnosis not present

## 2019-11-03 LAB — CBC WITH DIFFERENTIAL/PLATELET
Basophils Absolute: 0 10*3/uL (ref 0.0–0.1)
Basophils Relative: 0.8 % (ref 0.0–3.0)
Eosinophils Absolute: 0.1 10*3/uL (ref 0.0–0.7)
Eosinophils Relative: 1.3 % (ref 0.0–5.0)
HCT: 33.8 % — ABNORMAL LOW (ref 36.0–46.0)
Hemoglobin: 11.3 g/dL — ABNORMAL LOW (ref 12.0–15.0)
Lymphocytes Relative: 18.1 % (ref 12.0–46.0)
Lymphs Abs: 1.1 10*3/uL (ref 0.7–4.0)
MCHC: 33.6 g/dL (ref 30.0–36.0)
MCV: 93.4 fl (ref 78.0–100.0)
Monocytes Absolute: 0.4 10*3/uL (ref 0.1–1.0)
Monocytes Relative: 6.2 % (ref 3.0–12.0)
Neutro Abs: 4.7 10*3/uL (ref 1.4–7.7)
Neutrophils Relative %: 73.6 % (ref 43.0–77.0)
Platelets: 249 10*3/uL (ref 150.0–400.0)
RBC: 3.62 Mil/uL — ABNORMAL LOW (ref 3.87–5.11)
RDW: 13.5 % (ref 11.5–15.5)
WBC: 6.3 10*3/uL (ref 4.0–10.5)

## 2019-11-03 LAB — BASIC METABOLIC PANEL
BUN: 22 mg/dL (ref 6–23)
CO2: 35 mEq/L — ABNORMAL HIGH (ref 19–32)
Calcium: 9.5 mg/dL (ref 8.4–10.5)
Chloride: 97 mEq/L (ref 96–112)
Creatinine, Ser: 0.61 mg/dL (ref 0.40–1.20)
GFR: 92.78 mL/min (ref >60.00)
Glucose, Bld: 94 mg/dL (ref 70–99)
Potassium: 3.9 mEq/L (ref 3.5–5.1)
Sodium: 137 mEq/L (ref 135–145)

## 2019-11-03 NOTE — Progress Notes (Signed)
Subjective:    Patient ID: Alicia Boyd, female    DOB: Jan 11, 1930, 85 y.o.   MRN: [id number]  HPI 85 year old female who  has a past medical history of Aortic heart murmur, Chest pain (2007 ; 2011), Hyperlipidemia, Interstitial cystitis, Ocular migraine (04/10/2012), Osteoporosis, and Postmenopausal.  She presents to the office today for TCM visit   Admit Date: 10/03/2019 Discharge Date: 10/07/2019 Discharged from [facility] 10/21/2019?  He presented to the ED for evaluation of shortness of breath and swelling in her legs.  She had a recent hospitalization September 4 through September 10 for right hip fracture with right hemiarthroplasty.  He was discharged on aspirin 325 mg twice daily for 1 month for VTE prophylaxis to [facility]  Bilateral pleural effusions with progressive dyspnea on exertion -No PE on CTA, BNP elevated at 525, and COVID-19 negative, likely fluid overload/diastolic CHF -She had a repeat echo on 10/04/2019 which showed atrial enlargement-concern for amyloid-cardiologist Dr. Stanford Breed felt as though this could be worked up as outpatient with a cardiac MRI.  Chest x-ray on 10/04/2019 still showed pleural effusion-chest x-ray on discharge showed poor inspiratory effort compared to prior with mild effusions bilaterally, no overt consolidation.  Recommend repeat chest x-ray in 3 to 4 weeks  Essential hypertension -Pressure poorly controlled 123456 to [id number] systolic.  She was discontinued on amlodipine due to lower extremity swelling and consolidated to Toprol extended release 37.5 -She felt overall improving on diuresis, initially was IV transition to 40 mg p.o. twice daily on discharge.  She was -2.4 L at time of discharge  Hypokalemia -potassium 3.2 on discharge.  Sent home on potassium supplementation.  Recheck at office visit  Nonsustained V. Tach-4-5 beats on 10/04/2019 she was monitored on telemetry.  Increase metoprolol extended release from 25-37.5  Positive  troponin-felt likely due to setting of CHF  Anemia NOS-Ackley from chronic disease, hemoglobin 9.7  Today she presents the office with her neighbor.  Overall she feels as though she is improving, continues to be slightly weak but no longer has DOE.  She continues to live at home alone after being discharged from skilled nursing facility.  She has an appointment scheduled on 11/15/2019 with Dr. Stanford Breed for hospital follow-up.  She continues to work with physical therapy at home.  She is using a rolling walker with ambulation.  She denies chest pain, fevers, chills, diarrhea, or overt shortness of breath.  She has not had any falls since being discharged from the hospital or skilled nursing facility.  She does not feel as though she needs to use her rolling walker any longer.     Review of Systems  Constitutional: Negative.   HENT: Negative.   Eyes: Negative.   Respiratory: Negative.   Cardiovascular: Negative.   Gastrointestinal: Negative.   Endocrine: Negative.   Genitourinary: Negative.   Musculoskeletal: Positive for back pain.  Allergic/Immunologic: Negative.   Neurological: Negative.   Hematological: Negative.   Psychiatric/Behavioral: Negative.   All other systems reviewed and are negative.  Past Medical History:  Diagnosis Date  . Aortic heart murmur    AS/AI on 2D ECHO ; SBE prophylaxis Rxed  . Chest pain 2007 ; 2011   negative cardiac assessment  . Hyperlipidemia   . Interstitial cystitis    Resolved  . Ocular migraine 04/10/2012  . Osteoporosis    T score - 3.0 @ femoral neck; Fosamax affected swallowing  . Postmenopausal    No PMH or HRT  Social History   Socioeconomic History  . Marital status: Widowed    Spouse name: Not on file  . Number of children: Not on file  . Years of education: Not on file  . Highest education level: Not on file  Occupational History  . Not on file  Social Needs  . Financial resource strain: Not on file  . Food insecurity     Worry: Not on file    Inability: Not on file  . Transportation needs    Medical: Not on file    Non-medical: Not on file  Tobacco Use  . Smoking status: Never Smoker  . Smokeless tobacco: Never Used  Substance and Sexual Activity  . Alcohol use: No  . Drug use: No  . Sexual activity: Not Currently    Birth control/protection: None  Lifestyle  . Physical activity    Days per week: Not on file    Minutes per session: Not on file  . Stress: Not on file  Relationships  . Social Herbalist on phone: Not on file    Gets together: Not on file    Attends religious service: Not on file    Active member of club or organization: Not on file    Attends meetings of clubs or organizations: Not on file    Relationship status: Not on file  . Intimate partner violence    Fear of current or ex partner: Not on file    Emotionally abused: Not on file    Physically abused: Not on file    Forced sexual activity: Not on file  Other Topics Concern  . Not on file  Social History Narrative  . Not on file    Past Surgical History:  Procedure Laterality Date  . ANTERIOR APPROACH HEMI HIP ARTHROPLASTY Right 09/03/2019   Procedure: ANTERIOR APPROACH HEMI HIP ARTHROPLASTY BIPOLAR;  Surgeon: [name], MD;  Location: [facility];  Service: Orthopedics;  Laterality: Right;  . COLONOSCOPY  2011   Dr Sharlett Iles    Family History  Problem Relation Age of Onset  . Diabetes Father        Pancreatitic insufficiency post MVA  . Hypertension Mother   . Osteoporosis Mother   . Heart disease Mother        AF  . Thyroid disease Sister        hypothyroidism  . Coronary artery disease Sister        AF  . Stroke Sister 23       also pulmonary disease    Allergies  Allergen Reactions  . Amitid [Amitriptyline Hcl]     Patient experienced a dizzy, spinning feeling   . Metronidazole     REACTION: throat swells  . Penicillins     REACTION: rash & fever  . Alendronate Sodium     REACTION:  unspecified  . Clarithromycin     GI intolerance  . Flonase [Fluticasone Propionate]     Slight Headache   . Promethazine Hcl     REACTION: unspecified  . Robitussin (Alcohol Free) [Guaifenesin] Other (See Comments)    unknown hyperactive  . Spironolactone     09/21/13 weakness in legs  . Singulair [Montelukast Sodium]     "spacey"; ineffective    Current Outpatient Medications on File Prior to Visit  Medication Sig Dispense Refill  . acetaminophen (TYLENOL) 500 MG tablet Take 1,000 mg by mouth every 6 (six) hours as needed for moderate pain.    Marland Kitchen  amLODipine (NORVASC) 5 MG tablet TAKE 1 TABLET BY MOUTH EVERY DAY (Patient taking differently: Take 5 mg by mouth every evening. ) 90 tablet 1  . Cholecalciferol (VITAMIN D3) 1000 UNITS CAPS Take 1 capsule by mouth daily.     . feeding supplement, ENSURE ENLIVE, (ENSURE ENLIVE) LIQD Take 237 mLs by mouth 2 (two) times daily between meals. 237 mL 12  . ferrous sulfate 325 (65 FE) MG tablet Take 1 tablet (325 mg total) by mouth every other day. (Patient taking differently: Take 325 mg by mouth once a week. ) 30 tablet 3  . furosemide (LASIX) 40 MG tablet Take 1 tablet (40 mg total) by mouth 2 (two) times daily. 90 tablet 0  . metoprolol succinate (TOPROL-XL) 25 MG 24 hr tablet Take 1.5 tablets (37.5 mg total) by mouth daily. 90 tablet 1  . Multiple Vitamin (MULTIVITAMIN) tablet Take 1 tablet by mouth daily. chewable    . polyethylene glycol (MIRALAX / GLYCOLAX) 17 g packet Take 17 g by mouth 2 (two) times daily. (Patient taking differently: Take 17 g by mouth daily as needed for mild constipation. ) 14 each 0  . potassium chloride SA (KLOR-CON) 20 MEQ tablet Take 2 tablets (40 mEq total) by mouth daily. 180 tablet 0   No current facility-administered medications on file prior to visit.     BP (!) 144/78 (BP Location: Left Arm, Patient Position: Sitting, Cuff Size: Normal)   Pulse 79   Temp 98.3 F (36.8 C) (Temporal)   Wt 118 lb 9.6 oz  (53.8 kg)   SpO2 99%   BMI 19.74 kg/m       Objective:   Physical Exam Vitals signs and nursing note reviewed.  Constitutional:      Appearance: Normal appearance.  HENT:     Right Ear: Tympanic membrane normal.     Nose: Nose normal.     Mouth/Throat:     Mouth: Mucous membranes are moist.     Pharynx: No oropharyngeal exudate or posterior oropharyngeal erythema.  Eyes:     Extraocular Movements: Extraocular movements intact.     Pupils: Pupils are equal, round, and reactive to light.  Cardiovascular:     Rate and Rhythm: Normal rate and regular rhythm.     Pulses: Normal pulses.     Heart sounds: Normal heart sounds. No murmur. No friction rub. No gallop.   Pulmonary:     Effort: Pulmonary effort is normal. No respiratory distress.     Breath sounds: Normal breath sounds. No stridor. No wheezing, rhonchi or rales.  Chest:     Chest wall: No tenderness.  Abdominal:     General: Abdomen is flat. Bowel sounds are normal.     Palpations: Abdomen is soft.  Musculoskeletal: Normal range of motion.        General: No swelling, tenderness, deformity or signs of injury.     Right lower leg: 1+ Pitting Edema present.     Left lower leg: 1+ Pitting Edema present.  Skin:    General: Skin is warm and dry.     Coloration: Skin is not jaundiced or pale.     Findings: No bruising, erythema, lesion or rash.  Neurological:     General: No focal deficit present.     Mental Status: She is alert and oriented to person, place, and time.     Sensory: No sensory deficit.     Coordination: Coordination normal.     Comments: Steady gait with rolling  walker  Psychiatric:        Mood and Affect: Mood normal.        Behavior: Behavior normal.        Thought Content: Thought content normal.        Judgment: Judgment normal.       Assessment & Plan:  1. Bilateral pleural effusion -We reviewed labs, imaging, and hospital notes.  All questions answered to the best of my ability -Lungs are  clear on exam.  Likely resolved.  Repeat chest x-ray today - CBC with Differential/Platelet - Basic Metabolic Panel - DG Chest 2 View; Future - DG Chest 2 View  2. Lower extremity edema -Mild +1 pitting edema bilaterally.  Continue with Lasix twice daily - CBC with Differential/Platelet - Basic Metabolic Panel  3. Hypokalemia  - CBC with Differential/Platelet - Basic Metabolic Panel  4. Malnutrition of moderate degree -Courage to high-protein diet and Ensure drinks at least twice a day - CBC with Differential/Platelet - Basic Metabolic Panel  5. Essential hypertension -Better controlled.  Continue with Toprol-XL and Lasix as directed - CBC with Differential/Platelet - Basic Metabolic Panel  6. Anemia, unspecified type - Continue with iron supplement  - CBC with Differential/Platelet

## 2019-11-04 DIAGNOSIS — I5030 Unspecified diastolic (congestive) heart failure: Secondary | ICD-10-CM | POA: Diagnosis not present

## 2019-11-04 DIAGNOSIS — I2510 Atherosclerotic heart disease of native coronary artery without angina pectoris: Secondary | ICD-10-CM | POA: Diagnosis not present

## 2019-11-04 DIAGNOSIS — I0981 Rheumatic heart failure: Secondary | ICD-10-CM | POA: Diagnosis not present

## 2019-11-04 DIAGNOSIS — I110 Hypertensive heart disease with heart failure: Secondary | ICD-10-CM | POA: Diagnosis not present

## 2019-11-04 DIAGNOSIS — I082 Rheumatic disorders of both aortic and tricuspid valves: Secondary | ICD-10-CM | POA: Diagnosis not present

## 2019-11-04 DIAGNOSIS — M80051D Age-related osteoporosis with current pathological fracture, right femur, subsequent encounter for fracture with routine healing: Secondary | ICD-10-CM | POA: Diagnosis not present

## 2019-11-08 DIAGNOSIS — D630 Anemia in neoplastic disease: Secondary | ICD-10-CM | POA: Diagnosis not present

## 2019-11-08 DIAGNOSIS — I5030 Unspecified diastolic (congestive) heart failure: Secondary | ICD-10-CM | POA: Diagnosis not present

## 2019-11-08 DIAGNOSIS — E785 Hyperlipidemia, unspecified: Secondary | ICD-10-CM | POA: Diagnosis not present

## 2019-11-08 DIAGNOSIS — I8310 Varicose veins of unspecified lower extremity with inflammation: Secondary | ICD-10-CM | POA: Diagnosis not present

## 2019-11-08 DIAGNOSIS — H269 Unspecified cataract: Secondary | ICD-10-CM | POA: Diagnosis not present

## 2019-11-08 DIAGNOSIS — K5909 Other constipation: Secondary | ICD-10-CM | POA: Diagnosis not present

## 2019-11-08 DIAGNOSIS — I2510 Atherosclerotic heart disease of native coronary artery without angina pectoris: Secondary | ICD-10-CM | POA: Diagnosis not present

## 2019-11-08 DIAGNOSIS — M80051D Age-related osteoporosis with current pathological fracture, right femur, subsequent encounter for fracture with routine healing: Secondary | ICD-10-CM | POA: Diagnosis not present

## 2019-11-08 DIAGNOSIS — G5601 Carpal tunnel syndrome, right upper limb: Secondary | ICD-10-CM | POA: Diagnosis not present

## 2019-11-08 DIAGNOSIS — I700 Atherosclerosis of aorta: Secondary | ICD-10-CM | POA: Diagnosis not present

## 2019-11-08 DIAGNOSIS — F419 Anxiety disorder, unspecified: Secondary | ICD-10-CM | POA: Diagnosis not present

## 2019-11-08 DIAGNOSIS — I110 Hypertensive heart disease with heart failure: Secondary | ICD-10-CM | POA: Diagnosis not present

## 2019-11-08 DIAGNOSIS — E440 Moderate protein-calorie malnutrition: Secondary | ICD-10-CM | POA: Diagnosis not present

## 2019-11-08 DIAGNOSIS — K219 Gastro-esophageal reflux disease without esophagitis: Secondary | ICD-10-CM | POA: Diagnosis not present

## 2019-11-08 DIAGNOSIS — F329 Major depressive disorder, single episode, unspecified: Secondary | ICD-10-CM | POA: Diagnosis not present

## 2019-11-08 DIAGNOSIS — I0981 Rheumatic heart failure: Secondary | ICD-10-CM | POA: Diagnosis not present

## 2019-11-08 DIAGNOSIS — M8580 Other specified disorders of bone density and structure, unspecified site: Secondary | ICD-10-CM | POA: Diagnosis not present

## 2019-11-08 DIAGNOSIS — D1809 Hemangioma of other sites: Secondary | ICD-10-CM | POA: Diagnosis not present

## 2019-11-08 DIAGNOSIS — M1712 Unilateral primary osteoarthritis, left knee: Secondary | ICD-10-CM | POA: Diagnosis not present

## 2019-11-08 DIAGNOSIS — J8410 Pulmonary fibrosis, unspecified: Secondary | ICD-10-CM | POA: Diagnosis not present

## 2019-11-08 DIAGNOSIS — R1319 Other dysphagia: Secondary | ICD-10-CM | POA: Diagnosis not present

## 2019-11-08 DIAGNOSIS — J309 Allergic rhinitis, unspecified: Secondary | ICD-10-CM | POA: Diagnosis not present

## 2019-11-08 DIAGNOSIS — H35033 Hypertensive retinopathy, bilateral: Secondary | ICD-10-CM | POA: Diagnosis not present

## 2019-11-08 DIAGNOSIS — I082 Rheumatic disorders of both aortic and tricuspid valves: Secondary | ICD-10-CM | POA: Diagnosis not present

## 2019-11-08 DIAGNOSIS — G43109 Migraine with aura, not intractable, without status migrainosus: Secondary | ICD-10-CM | POA: Diagnosis not present

## 2019-11-08 NOTE — Progress Notes (Addendum)
Rowe Pavy MD Reason for referral-Possible amyloidosis  HPI: 85 year old female for evaluation of possible amyloidosis at request of Nita Sells MD. Pt recently admitted with CHF symptoms following prior admission for repair of right hip fracture.  CTA showed no pulmonary embolus, small bilateral pleural effusions, pulmonary edema and three-vessel coronary calcification.  Echocardiogram 10/20 showed normal LV function, moderate left ventricular hypertrophy, grade 2 diastolic dysfunction, severe left atrial enlargement.  Consider amyloidosis.  Cardiology now asked to evaluate.  Patient states that she has had some dyspnea on exertion and bilateral lower extremity edema for quite some time but ignored it because she was caring for her sick husband.  She fell and fractured her hip and underwent surgical repair.  Following discharge she developed worsening dyspnea and bilateral lower extremity edema.  There was no chest pain.  She was readmitted and treated with Lasix and is markedly improved.  She has mild dyspnea on exertion at present and her pedal edema has resolved.  She denies syncope.  She does have occasional palpitations.  Current Outpatient Medications  Medication Sig Dispense Refill  . acetaminophen (TYLENOL) 500 MG tablet Take 1,000 mg by mouth every 6 (six) hours as needed for moderate pain.    Marland Kitchen amLODipine (NORVASC) 5 MG tablet TAKE 1 TABLET BY MOUTH EVERY DAY (Patient taking differently: Take 5 mg by mouth every evening. ) 90 tablet 1  . Cholecalciferol (VITAMIN D3) 1000 UNITS CAPS Take 1 capsule by mouth daily.     Marland Kitchen docusate sodium (COLACE) 100 MG capsule Take 100 mg by mouth 2 (two) times daily.    . feeding supplement, ENSURE ENLIVE, (ENSURE ENLIVE) LIQD Take 237 mLs by mouth 2 (two) times daily between meals. 237 mL 12  . ferrous sulfate 325 (65 FE) MG tablet Take 1 tablet (325 mg total) by mouth every other day. (Patient taking differently: Take 325 mg by  mouth once a week. ) 30 tablet 3  . fexofenadine (ALLEGRA) 180 MG tablet Take 180 mg by mouth as needed for allergies or rhinitis.    . furosemide (LASIX) 40 MG tablet Take 1 tablet (40 mg total) by mouth 2 (two) times daily. 90 tablet 0  . metoprolol succinate (TOPROL-XL) 25 MG 24 hr tablet Take 1.5 tablets (37.5 mg total) by mouth daily. 90 tablet 1  . Multiple Vitamin (MULTIVITAMIN) tablet Take 1 tablet by mouth daily. chewable    . polyethylene glycol (MIRALAX / GLYCOLAX) 17 g packet Take 17 g by mouth 2 (two) times daily. (Patient taking differently: Take 17 g by mouth daily as needed for mild constipation. ) 14 each 0  . potassium chloride SA (KLOR-CON) 20 MEQ tablet Take 2 tablets (40 mEq total) by mouth daily. 180 tablet 0   No current facility-administered medications for this visit.     Allergies  Allergen Reactions  . Amitid [Amitriptyline Hcl]     Patient experienced a dizzy, spinning feeling   . Metronidazole     REACTION: throat swells  . Penicillins     REACTION: rash & fever  . Alendronate Sodium     REACTION: unspecified  . Clarithromycin     GI intolerance  . Flonase [Fluticasone Propionate]     Slight Headache   . Promethazine Hcl     REACTION: unspecified  . Robitussin (Alcohol Free) [Guaifenesin] Other (See Comments)    unknown hyperactive  . Spironolactone     09/21/13 weakness in legs  . Singulair [Montelukast Sodium]     "  spacey"; ineffective     Past Medical History:  Diagnosis Date  . Aortic heart murmur    AS/AI on 2D ECHO ; SBE prophylaxis Rxed  . Chest pain 2007 ; 2011   negative cardiac assessment  . Hyperlipidemia   . Hypertension [  . Interstitial cystitis    Resolved  . Ocular migraine 04/10/2012  . Osteoporosis    T score - 3.0 @ femoral neck; Fosamax affected swallowing  . Postmenopausal    No PMH or HRT    Past Surgical History:  Procedure Laterality Date  . ANTERIOR APPROACH HEMI HIP ARTHROPLASTY Right 09/03/2019   Procedure:  ANTERIOR APPROACH HEMI HIP ARTHROPLASTY BIPOLAR;  Surgeon: [name], MD;  Location: [facility];  Service: Orthopedics;  Laterality: Right;  . COLONOSCOPY  2011   Dr Sharlett Iles    Social History   Socioeconomic History  . Marital status: Widowed    Spouse name: Not on file  . Number of children: 1  . Years of education: Not on file  . Highest education level: Not on file  Occupational History  . Not on file  Social Needs  . Financial resource strain: Not on file  . Food insecurity    Worry: Not on file    Inability: Not on file  . Transportation needs    Medical: Not on file    Non-medical: Not on file  Tobacco Use  . Smoking status: Never Smoker  . Smokeless tobacco: Never Used  Substance and Sexual Activity  . Alcohol use: No  . Drug use: No  . Sexual activity: Not Currently    Birth control/protection: None  Lifestyle  . Physical activity    Days per week: Not on file    Minutes per session: Not on file  . Stress: Not on file  Relationships  . Social Herbalist on phone: Not on file    Gets together: Not on file    Attends religious service: Not on file    Active member of club or organization: Not on file    Attends meetings of clubs or organizations: Not on file    Relationship status: Not on file  . Intimate partner violence    Fear of current or ex partner: Not on file    Emotionally abused: Not on file    Physically abused: Not on file    Forced sexual activity: Not on file  Other Topics Concern  . Not on file  Social History Narrative  . Not on file    Family History  Problem Relation Age of Onset  . Diabetes Father        Pancreatitic insufficiency post MVA  . Hypertension Mother   . Osteoporosis Mother   . Heart disease Mother        AF  . Thyroid disease Sister        hypothyroidism  . Coronary artery disease Sister        AF  . Stroke Sister 1       also pulmonary disease    ROS: Some recent pain in hip from fracture but no  fevers or chills, productive cough, hemoptysis, dysphasia, odynophagia, melena, hematochezia, dysuria, hematuria, rash, seizure activity, orthopnea, PND, claudication. Remaining systems are negative.  Physical Exam:   Blood pressure 132/68, pulse 78, temperature (!) 97.5 F (36.4 C), height 5\' 5"  (1.651 m), weight 115 lb (52.2 kg).  General:  Well developed/well nourished in NAD Skin warm/dry Patient not depressed  No peripheral clubbing Back-normal HEENT-normal/normal eyelids Neck supple/normal carotid upstroke bilaterally; no bruits; no JVD; no thyromegaly chest - CTA/ normal expansion CV - RRR/normal S1 and S2; no murmurs, rubs or gallops;  PMI nondisplaced Abdomen -NT/ND, no HSM, no mass, + bowel sounds, no bruit 2+ femoral pulses, no bruits Ext-trace edema, chords, 2+ DP Neuro-grossly nonfocal  ECG -October 03, 2019-sinus rhythm, left ventricular hypertrophy with repolarization abnormality personally reviewed  A/P  1 chronic diastolic congestive heart failure-patient presents for evaluation of diastolic congestive heart failure.  She was having some symptoms prior to her hip fracture including dyspnea and lower extremity edema.  Her symptoms worsened after admission for repair of hip fracture.  There is likely some excess volume related to surgery.  She is markedly improved with Lasix 40 mg twice daily.  Laboratories from November 4 personally reviewed and showed sodium 137, potassium 3.9, BUN 22 and creatinine 0.61.  We will continue with present dose.  Needs fluid restriction and low-sodium diet.  LV function showed preserved LV systolic function and grade 2 diastolic dysfunction.  2 possible amyloidosis-I personally reviewed the patient's echocardiogram.  Amyloidosis cannot be excluded.  However patient is 85 years old and wants mainly conservative measures which I think would be appropriate.  I will therefore not began extensive evaluation at this point.  She is in agreement.  3  palpitations-etiology unclear.  If she has more frequent episodes in the future we will consider a monitor.  4 hypertension-blood pressure is controlled.  Continue present medications and follow.  5 coronary artery disease-coronary calcification noted on CT scan.  We will add aspirin 81 mg daily.  Kirk Ruths, MD

## 2019-11-09 DIAGNOSIS — M80051D Age-related osteoporosis with current pathological fracture, right femur, subsequent encounter for fracture with routine healing: Secondary | ICD-10-CM | POA: Diagnosis not present

## 2019-11-09 DIAGNOSIS — I5030 Unspecified diastolic (congestive) heart failure: Secondary | ICD-10-CM | POA: Diagnosis not present

## 2019-11-09 DIAGNOSIS — I082 Rheumatic disorders of both aortic and tricuspid valves: Secondary | ICD-10-CM | POA: Diagnosis not present

## 2019-11-09 DIAGNOSIS — I2510 Atherosclerotic heart disease of native coronary artery without angina pectoris: Secondary | ICD-10-CM | POA: Diagnosis not present

## 2019-11-09 DIAGNOSIS — I110 Hypertensive heart disease with heart failure: Secondary | ICD-10-CM | POA: Diagnosis not present

## 2019-11-09 DIAGNOSIS — I0981 Rheumatic heart failure: Secondary | ICD-10-CM | POA: Diagnosis not present

## 2019-11-11 ENCOUNTER — Telehealth: Payer: Self-pay | Admitting: Cardiology

## 2019-11-11 NOTE — Telephone Encounter (Signed)
Spoke with pt, Aware of dr crenshaw's recommendations.  °

## 2019-11-11 NOTE — Telephone Encounter (Signed)
New Message  Patient is calling to get approval to get her sitter Caren Griffins approved to attend appointment with Dr. Stanford Breed on 11/15/19 at 10:20 am. Patient states that she walks with a walker and needs assistance from [place] during her appointment. Please give patient a call to confirm.

## 2019-11-11 NOTE — Telephone Encounter (Signed)
Ok  Alicia Boyd  

## 2019-11-15 ENCOUNTER — Encounter: Payer: Self-pay | Admitting: Cardiology

## 2019-11-15 ENCOUNTER — Other Ambulatory Visit: Payer: Self-pay

## 2019-11-15 ENCOUNTER — Ambulatory Visit (INDEPENDENT_AMBULATORY_CARE_PROVIDER_SITE_OTHER): Payer: MEDICARE | Admitting: Cardiology

## 2019-11-15 VITALS — BP 132/68 | HR 78 | Temp 97.5°F | Ht 65.0 in | Wt 115.0 lb

## 2019-11-15 DIAGNOSIS — I10 Essential (primary) hypertension: Secondary | ICD-10-CM | POA: Diagnosis not present

## 2019-11-15 DIAGNOSIS — I5032 Chronic diastolic (congestive) heart failure: Secondary | ICD-10-CM | POA: Diagnosis not present

## 2019-11-15 DIAGNOSIS — R002 Palpitations: Secondary | ICD-10-CM | POA: Diagnosis not present

## 2019-11-15 MED ORDER — ASPIRIN EC 81 MG PO TBEC: 81.0000 mg | DELAYED_RELEASE_TABLET | Freq: Every day | ORAL | 3 refills | Status: DC

## 2019-11-15 NOTE — Patient Instructions (Signed)
Medication Instructions:  START ASPIRIN 81 MG ONCE DAILY  *If you need a refill on your cardiac medications before your next appointment, please call your pharmacy*  Lab Work: If you have labs (blood work) drawn today and your tests are completely normal, you will receive your results only by: Marland Kitchen MyChart Message (if you have MyChart) OR . A paper copy in the mail If you have any lab test that is abnormal or we need to change your treatment, we will call you to review the results.  Follow-Up: At [facility], you and your health needs are our priority.  As part of our continuing mission to provide you with exceptional heart care, we have created designated Provider Care Teams.  These Care Teams include your primary Cardiologist (physician) and Advanced Practice Providers (APPs -  Physician Assistants and Nurse Practitioners) who all work together to provide you with the care you need, when you need it.  Your next appointment:   3 months  The format for your next appointment:   In Person  Provider:   Kirk Ruths, MD

## 2019-11-16 DIAGNOSIS — I2510 Atherosclerotic heart disease of native coronary artery without angina pectoris: Secondary | ICD-10-CM | POA: Diagnosis not present

## 2019-11-16 DIAGNOSIS — M80051D Age-related osteoporosis with current pathological fracture, right femur, subsequent encounter for fracture with routine healing: Secondary | ICD-10-CM | POA: Diagnosis not present

## 2019-11-16 DIAGNOSIS — I082 Rheumatic disorders of both aortic and tricuspid valves: Secondary | ICD-10-CM | POA: Diagnosis not present

## 2019-11-16 DIAGNOSIS — I0981 Rheumatic heart failure: Secondary | ICD-10-CM | POA: Diagnosis not present

## 2019-11-16 DIAGNOSIS — I110 Hypertensive heart disease with heart failure: Secondary | ICD-10-CM | POA: Diagnosis not present

## 2019-11-16 DIAGNOSIS — I5030 Unspecified diastolic (congestive) heart failure: Secondary | ICD-10-CM | POA: Diagnosis not present

## 2019-11-23 ENCOUNTER — Telehealth: Payer: Self-pay | Admitting: Adult Health

## 2019-11-23 DIAGNOSIS — I0981 Rheumatic heart failure: Secondary | ICD-10-CM | POA: Diagnosis not present

## 2019-11-23 DIAGNOSIS — I110 Hypertensive heart disease with heart failure: Secondary | ICD-10-CM | POA: Diagnosis not present

## 2019-11-23 DIAGNOSIS — I082 Rheumatic disorders of both aortic and tricuspid valves: Secondary | ICD-10-CM | POA: Diagnosis not present

## 2019-11-23 DIAGNOSIS — I2510 Atherosclerotic heart disease of native coronary artery without angina pectoris: Secondary | ICD-10-CM | POA: Diagnosis not present

## 2019-11-23 DIAGNOSIS — I5030 Unspecified diastolic (congestive) heart failure: Secondary | ICD-10-CM | POA: Diagnosis not present

## 2019-11-23 DIAGNOSIS — M80051D Age-related osteoporosis with current pathological fracture, right femur, subsequent encounter for fracture with routine healing: Secondary | ICD-10-CM | POA: Diagnosis not present

## 2019-11-23 NOTE — Telephone Encounter (Signed)
Pt still taking 1 tablet (40 mg total) by mouth 2 (two) times daily. - Oral?

## 2019-11-23 NOTE — Telephone Encounter (Signed)
Copied from [place] [id number]. Topic: General - Other >> Nov 23, 2019  3:32 PM Keene Breath wrote: Reason for CRM: Called to ask the nurse to call regarding the patient's dosage of medication, furosemide (LASIX) 40 MG tablet.  CB# [phone]

## 2019-11-24 NOTE — Telephone Encounter (Signed)
Patient is returning a call to [facility].  Misty was with a patient.  Please call back at [phone]

## 2019-11-24 NOTE — Telephone Encounter (Signed)
Left a message for a return call.

## 2019-11-24 NOTE — Telephone Encounter (Signed)
yes

## 2019-11-26 DIAGNOSIS — M80051D Age-related osteoporosis with current pathological fracture, right femur, subsequent encounter for fracture with routine healing: Secondary | ICD-10-CM | POA: Diagnosis not present

## 2019-11-26 DIAGNOSIS — I082 Rheumatic disorders of both aortic and tricuspid valves: Secondary | ICD-10-CM | POA: Diagnosis not present

## 2019-11-26 DIAGNOSIS — I5030 Unspecified diastolic (congestive) heart failure: Secondary | ICD-10-CM | POA: Diagnosis not present

## 2019-11-26 DIAGNOSIS — I0981 Rheumatic heart failure: Secondary | ICD-10-CM | POA: Diagnosis not present

## 2019-11-26 DIAGNOSIS — I2510 Atherosclerotic heart disease of native coronary artery without angina pectoris: Secondary | ICD-10-CM | POA: Diagnosis not present

## 2019-11-26 DIAGNOSIS — I110 Hypertensive heart disease with heart failure: Secondary | ICD-10-CM | POA: Diagnosis not present

## 2019-11-26 NOTE — Telephone Encounter (Signed)
[name] called back, she can be reached at [phone]

## 2019-11-28 ENCOUNTER — Other Ambulatory Visit: Payer: Self-pay | Admitting: Adult Health

## 2019-11-28 DIAGNOSIS — I10 Essential (primary) hypertension: Secondary | ICD-10-CM

## 2019-11-29 ENCOUNTER — Other Ambulatory Visit: Payer: Self-pay | Admitting: Adult Health

## 2019-11-30 ENCOUNTER — Ambulatory Visit: Payer: MEDICARE | Admitting: Cardiology

## 2019-12-01 NOTE — Telephone Encounter (Signed)
Left a message for Alicia Boyd to return call.

## 2019-12-02 NOTE — Telephone Encounter (Signed)
She should be fine doing that

## 2019-12-02 NOTE — Telephone Encounter (Signed)
Left a message for Alicia Boyd to return my call. 

## 2019-12-02 NOTE — Telephone Encounter (Signed)
Spoke to [place].  She stated pt is having pain in her rt thigh.  Wanted to know if pt could take Tylenol.  Advised pt could take PRN.  Pt was advised to take furosemide BID.  Wanted to know if they can be taken 6 hours apart.  Pt is waking in the middle of the night and cannot make it to the commode.  Informed [name] that I think that will by fine but to watch for dehydration.  I will call her back if [name] has further instructions about furosemide and anything else.  If she doesn't hear from me that proceed as planned.

## 2019-12-07 DIAGNOSIS — I2510 Atherosclerotic heart disease of native coronary artery without angina pectoris: Secondary | ICD-10-CM | POA: Diagnosis not present

## 2019-12-07 DIAGNOSIS — I082 Rheumatic disorders of both aortic and tricuspid valves: Secondary | ICD-10-CM | POA: Diagnosis not present

## 2019-12-07 DIAGNOSIS — I0981 Rheumatic heart failure: Secondary | ICD-10-CM | POA: Diagnosis not present

## 2019-12-07 DIAGNOSIS — M80051D Age-related osteoporosis with current pathological fracture, right femur, subsequent encounter for fracture with routine healing: Secondary | ICD-10-CM | POA: Diagnosis not present

## 2019-12-07 DIAGNOSIS — I110 Hypertensive heart disease with heart failure: Secondary | ICD-10-CM | POA: Diagnosis not present

## 2019-12-07 DIAGNOSIS — I5030 Unspecified diastolic (congestive) heart failure: Secondary | ICD-10-CM | POA: Diagnosis not present

## 2020-01-06 ENCOUNTER — Telehealth: Payer: Self-pay | Admitting: *Deleted

## 2020-01-06 NOTE — Telephone Encounter (Signed)
Copied from [place] [id number]. Topic: General - Other >> Jan 06, 2020  4:09 PM Rainey Pines A wrote: [name] home health nurse called to inform Nafziger that Patient has iron on meds list but patient hasnt been able to tolerate medication and was also unclear on why she was prescribed the medication . Best contact [phone]

## 2020-01-07 NOTE — Telephone Encounter (Signed)
She was prescribed them for iron deficiency anemia. If she cannot tolerate them, ok to d/c

## 2020-01-07 NOTE — Telephone Encounter (Signed)
Alicia Boyd notified of update.

## 2020-01-08 ENCOUNTER — Other Ambulatory Visit: Payer: Self-pay | Admitting: Adult Health

## 2020-01-12 ENCOUNTER — Other Ambulatory Visit: Payer: Self-pay | Admitting: Adult Health

## 2020-01-27 DIAGNOSIS — M1711 Unilateral primary osteoarthritis, right knee: Secondary | ICD-10-CM | POA: Diagnosis not present

## 2020-01-27 DIAGNOSIS — M25551 Pain in right hip: Secondary | ICD-10-CM | POA: Diagnosis not present

## 2020-02-01 ENCOUNTER — Other Ambulatory Visit: Payer: Self-pay | Admitting: Adult Health

## 2020-02-01 DIAGNOSIS — I10 Essential (primary) hypertension: Secondary | ICD-10-CM

## 2020-02-07 NOTE — Progress Notes (Signed)
HPI: FU CHF and possible amyloidosis. Pt previously admitted with CHF symptoms following prior admission for repair of right hip fracture. CTA showed no pulmonary embolus, small bilateral pleural effusions, pulmonary edema and three-vessel coronary calcification. Echocardiogram 10/20 showed normal LV function, moderate left ventricular hypertrophy, grade 2 diastolic dysfunction, severe left atrial enlargement; consider amyloidosis.  At previous office visit patient requested no further evaluation for amyloid which I felt was appropriate given her age. Since last seen she denies increased dyspnea, chest pain, syncope or pedal edema.  One 15-minute episode of palpitations 6 weeks ago.  Current Outpatient Medications  Medication Sig Dispense Refill  . acetaminophen (TYLENOL) 500 MG tablet Take 1,000 mg by mouth every 6 (six) hours as needed for moderate pain.    Marland Kitchen amLODipine (NORVASC) 5 MG tablet Take 1 tablet (5 mg total) by mouth every evening. 90 tablet 1  . aspirin EC 81 MG tablet Take 1 tablet (81 mg total) by mouth daily. 90 tablet 3  . Cholecalciferol (VITAMIN D3) 1000 UNITS CAPS Take 1 capsule by mouth daily.     Marland Kitchen docusate sodium (COLACE) 100 MG capsule Take 100 mg by mouth 2 (two) times daily.    . feeding supplement, ENSURE ENLIVE, (ENSURE ENLIVE) LIQD Take 237 mLs by mouth 2 (two) times daily between meals. 237 mL 12  . ferrous sulfate 325 (65 FE) MG tablet Take 1 tablet (325 mg total) by mouth every other day. (Patient taking differently: Take 325 mg by mouth once a week. ) 30 tablet 3  . fexofenadine (ALLEGRA) 180 MG tablet Take 180 mg by mouth as needed for allergies or rhinitis.    . furosemide (LASIX) 40 MG tablet TAKE 1 TABLET BY MOUTH TWICE A DAY 180 tablet 0  . KLOR-CON M20 20 MEQ tablet TAKE 2 TABLETS BY MOUTH DAILY 180 tablet 0  . metoprolol succinate (TOPROL-XL) 25 MG 24 hr tablet TAKE 1.5 TABLETS (37.5 MG TOTAL) BY MOUTH DAILY. 90 tablet 0  . Multiple Vitamin  (MULTIVITAMIN) tablet Take 1 tablet by mouth daily. chewable    . polyethylene glycol (MIRALAX / GLYCOLAX) 17 g packet Take 17 g by mouth 2 (two) times daily. (Patient taking differently: Take 17 g by mouth daily as needed for mild constipation. ) 14 each 0   No current facility-administered medications for this visit.     Past Medical History:  Diagnosis Date  . Aortic heart murmur    AS/AI on 2D ECHO ; SBE prophylaxis Rxed  . Chest pain 2007 ; 2011   negative cardiac assessment  . Hyperlipidemia   . Hypertension [  . Interstitial cystitis    Resolved  . Ocular migraine 04/10/2012  . Osteoporosis    T score - 3.0 @ femoral neck; Fosamax affected swallowing  . Postmenopausal    No PMH or HRT    Past Surgical History:  Procedure Laterality Date  . ANTERIOR APPROACH HEMI HIP ARTHROPLASTY Right 09/03/2019   Procedure: ANTERIOR APPROACH HEMI HIP ARTHROPLASTY BIPOLAR;  Surgeon: [name], MD;  Location: [facility];  Service: Orthopedics;  Laterality: Right;  . COLONOSCOPY  2011   Dr Sharlett Iles    Social History   Socioeconomic History  . Marital status: Widowed    Spouse name: Not on file  . Number of children: 1  . Years of education: Not on file  . Highest education level: Not on file  Occupational History  . Not on file  Tobacco Use  . Smoking  status: Never Smoker  . Smokeless tobacco: Never Used  Substance and Sexual Activity  . Alcohol use: No  . Drug use: No  . Sexual activity: Not Currently    Birth control/protection: None  Other Topics Concern  . Not on file  Social History Narrative  . Not on file   Social Determinants of Health   Financial Resource Strain:   . Difficulty of Paying Living Expenses: Not on file  Food Insecurity:   . Worried About Charity fundraiser in the Last Year: Not on file  . Ran Out of Food in the Last Year: Not on file  Transportation Needs:   . Lack of Transportation (Medical): Not on file  . Lack of Transportation  (Non-Medical): Not on file  Physical Activity:   . Days of Exercise per Week: Not on file  . Minutes of Exercise per Session: Not on file  Stress:   . Feeling of Stress : Not on file  Social Connections:   . Frequency of Communication with Friends and Family: Not on file  . Frequency of Social Gatherings with Friends and Family: Not on file  . Attends Religious Services: Not on file  . Active Member of Clubs or Organizations: Not on file  . Attends Archivist Meetings: Not on file  . Marital Status: Not on file  Intimate Partner Violence:   . Fear of Current or Ex-Partner: Not on file  . Emotionally Abused: Not on file  . Physically Abused: Not on file  . Sexually Abused: Not on file    Family History  Problem Relation Age of Onset  . Diabetes Father        Pancreatitic insufficiency post MVA  . Hypertension Mother   . Osteoporosis Mother   . Heart disease Mother        AF  . Thyroid disease Sister        hypothyroidism  . Coronary artery disease Sister        AF  . Stroke Sister 53       also pulmonary disease    ROS: no fevers or chills, productive cough, hemoptysis, dysphasia, odynophagia, melena, hematochezia, dysuria, hematuria, rash, seizure activity, orthopnea, PND, pedal edema, claudication. Remaining systems are negative.  Physical Exam: Well-developed well-nourished in no acute distress.  Skin is warm and dry.  HEENT is normal.  Neck is supple.  Chest is clear to auscultation with normal expansion.  Cardiovascular exam is regular rate and rhythm.  Abdominal exam nontender or distended. No masses palpated. Extremities show no edema. neuro grossly intact  A/P  1 chronic diastolic congestive heart failure-patient appears to be euvolemic. Continue fluid restriction and low-sodium diet.  Decrease Lasix to 40 mg daily with an additional 40 mg daily for worsening edema or shortness of breath.  May be able to wean diuretic further depending on how she  does symptomatically.  Check potassium and renal function.  2 possible amyloidosis-question on previous echocardiogram.  Patient wants only conservative measures which I think is appropriate given her age.  No further evaluation at this time.  3 hypertension-blood pressure controlled.  Continue present medical regimen.  4 history of palpitations-she had one 15-minute episode of palpitations 6 weeks ago.  We will consider a monitor in the future if needed.  5 coronary artery disease-coronary calcification noted on CT scan.  Continue aspirin.   Kirk Ruths, MD

## 2020-02-10 ENCOUNTER — Telehealth (INDEPENDENT_AMBULATORY_CARE_PROVIDER_SITE_OTHER): Payer: MEDICARE | Admitting: Adult Health

## 2020-02-10 ENCOUNTER — Other Ambulatory Visit: Payer: Self-pay

## 2020-02-10 DIAGNOSIS — M25551 Pain in right hip: Secondary | ICD-10-CM

## 2020-02-10 NOTE — Progress Notes (Signed)
Virtual Visit via Telephone Note  I connected with Loel Lofty on 02/10/20 at  1:30 PM EST by telephone and verified that I am speaking with the correct person using two identifiers.   I discussed the limitations, risks, security and privacy concerns of performing an evaluation and management service by telephone and the availability of in person appointments. I also discussed with the patient that there may be a patient responsible charge related to this service. The patient expressed understanding and agreed to proceed.  Location patient: home Location provider: work or home office Participants present for the call: patient, provider Patient did not have a visit in the prior 7 days to address this/these issue(s).   History of Present Illness: 86 year old female who in September 2020 had a displaced fracture of the right femoral neck.  She subsequently had a hemihip arthroplasty on 09/03/2019.  She has been evaluated by orthopedics and has completed rehab and physical therapy.  She reports that she continues to have some mild to moderate discomfort.  She usually takes Tylenol 1000 mg nightly but does not take anything during the day.  She would like to know if it is okay for her to take Tylenol in the morning as well.  She is living at home, recently widowed.  Is looking for assisted living facility to go to but is having trouble due to Covid.  She is on a wait list at [facility]  She denies falls, syncope, chest pain, or shortness of breath   Observations/Objective: Patient sounds cheerful and well on the phone. I do not appreciate any SOB. Speech and thought processing are grossly intact. Patient reported vitals:  Assessment and Plan: 1. Right hip pain -She was advised that it would be completely fine to take 1000 mg in the morning as well.  She can take this up to 3 times a day if needed.  She was advised to follow-up if this does not help reduce her pain level.   Follow Up  Instructions:  I did not refer this patient for an OV in the next 24 hours for this/these issue(s).  I discussed the assessment and treatment plan with the patient. The patient was provided an opportunity to ask questions and all were answered. The patient agreed with the plan and demonstrated an understanding of the instructions.   The patient was advised to call back or seek an in-person evaluation if the symptoms worsen or if the condition fails to improve as anticipated.  I provided 15  minutes of non-face-to-face time during this encounter.   [name], NP

## 2020-02-15 ENCOUNTER — Encounter: Payer: Self-pay | Admitting: Cardiology

## 2020-02-15 ENCOUNTER — Ambulatory Visit (INDEPENDENT_AMBULATORY_CARE_PROVIDER_SITE_OTHER): Payer: MEDICARE | Admitting: Cardiology

## 2020-02-15 ENCOUNTER — Other Ambulatory Visit: Payer: Self-pay

## 2020-02-15 VITALS — BP 148/70 | HR 79 | Temp 97.3°F | Ht 65.0 in | Wt 120.4 lb

## 2020-02-15 DIAGNOSIS — I10 Essential (primary) hypertension: Secondary | ICD-10-CM | POA: Diagnosis not present

## 2020-02-15 DIAGNOSIS — I5032 Chronic diastolic (congestive) heart failure: Secondary | ICD-10-CM

## 2020-02-15 DIAGNOSIS — R002 Palpitations: Secondary | ICD-10-CM | POA: Diagnosis not present

## 2020-02-15 MED ORDER — FUROSEMIDE 40 MG PO TABS: 40.0000 mg | ORAL_TABLET | Freq: Every day | ORAL | 0 refills | Status: DC

## 2020-02-15 NOTE — Patient Instructions (Signed)
Medication Instructions:  DECREASE FUROSEMIDE TO 40 MG ONCE DAILY=MAY TAKE EXTRA 40 MG AS NEEDED FOR SWELLING  *If you need a refill on your cardiac medications before your next appointment, please call your pharmacy*  Lab Work: Your physician recommends that you HAVE LAB WORK TODAY  If you have labs (blood work) drawn today and your tests are completely normal, you will receive your results only by: Marland Kitchen MyChart Message (if you have MyChart) OR . A paper copy in the mail If you have any lab test that is abnormal or we need to change your treatment, we will call you to review the results.  Follow-Up: At [facility], you and your health needs are our priority.  As part of our continuing mission to provide you with exceptional heart care, we have created designated Provider Care Teams.  These Care Teams include your primary Cardiologist (physician) and Advanced Practice Providers (APPs -  Physician Assistants and Nurse Practitioners) who all work together to provide you with the care you need, when you need it.  Your next appointment:   6 month(s)  The format for your next appointment:   Either In Person or Virtual  Provider:   You may see Kirk Ruths MD or one of the following Advanced Practice Providers on your designated Care Team:    Kerin Ransom, PA-C  [place], [place]  Coletta Memos, [place]

## 2020-02-16 ENCOUNTER — Telehealth: Payer: Self-pay | Admitting: *Deleted

## 2020-02-16 DIAGNOSIS — E875 Hyperkalemia: Secondary | ICD-10-CM

## 2020-02-16 LAB — BASIC METABOLIC PANEL
BUN/Creatinine Ratio: 33 — ABNORMAL HIGH (ref 12–28)
BUN: 19 mg/dL (ref 8–27)
CO2: 29 mmol/L (ref 20–29)
Calcium: 9.9 mg/dL (ref 8.7–10.3)
Chloride: 95 mmol/L — ABNORMAL LOW (ref 96–106)
Creatinine, Ser: 0.58 mg/dL (ref 0.57–1.00)
GFR calc Af Amer: 96 mL/min/{1.73_m2} (ref >59)
GFR calc non Af Amer: 83 mL/min/{1.73_m2} (ref >59)
Glucose: 86 mg/dL (ref 65–99)
Potassium: 5.3 mmol/L — ABNORMAL HIGH (ref 3.5–5.2)
Sodium: 136 mmol/L (ref 134–144)

## 2020-02-16 NOTE — Telephone Encounter (Addendum)
Aware of dr Jacalyn Lefevre recommendations. Patient voiced understanding to stop potassium. Lab orders mailed to the pt    ----- Message from Lelon Perla, MD sent at 02/16/2020  7:33 AM EST ----- DC kcl; bmet 1 week Kirk Ruths

## 2020-02-22 ENCOUNTER — Other Ambulatory Visit: Payer: Self-pay | Admitting: Adult Health

## 2020-02-23 DIAGNOSIS — E875 Hyperkalemia: Secondary | ICD-10-CM | POA: Diagnosis not present

## 2020-02-24 LAB — BASIC METABOLIC PANEL
BUN/Creatinine Ratio: 23 (ref 12–28)
BUN: 15 mg/dL (ref 8–27)
CO2: 26 mmol/L (ref 20–29)
Calcium: 9.7 mg/dL (ref 8.7–10.3)
Chloride: 98 mmol/L (ref 96–106)
Creatinine, Ser: 0.64 mg/dL (ref 0.57–1.00)
GFR calc Af Amer: 93 mL/min/{1.73_m2} (ref >59)
GFR calc non Af Amer: 80 mL/min/{1.73_m2} (ref >59)
Glucose: 80 mg/dL (ref 65–99)
Potassium: 4.7 mmol/L (ref 3.5–5.2)
Sodium: 139 mmol/L (ref 134–144)

## 2020-02-28 ENCOUNTER — Ambulatory Visit: Payer: MEDICARE | Attending: Internal Medicine

## 2020-02-28 DIAGNOSIS — Z23 Encounter for immunization: Secondary | ICD-10-CM

## 2020-02-28 NOTE — Progress Notes (Signed)
   Covid-19 Vaccination Clinic  Name:  Alicia Boyd    MRN: [id number] DOB: [birth year]  02/28/2020  [name] was observed post Covid-19 immunization for 15 minutes without incidence. She was provided with Vaccine Information Sheet and instruction to access the V-Safe system.   [name] was instructed to call 911 with any severe reactions post vaccine: Marland Kitchen Difficulty breathing  . Swelling of your face and throat  . A fast heartbeat  . A bad rash all over your body  . Dizziness and weakness    Immunizations Administered    Name Date Dose VIS Date Route   Pfizer COVID-19 Vaccine 02/28/2020 12:49 PM 0.3 mL 12/10/2019 Intramuscular   Manufacturer: [place]   Lot: [id number]   [place]: [id number]

## 2020-03-02 DIAGNOSIS — M533 Sacrococcygeal disorders, not elsewhere classified: Secondary | ICD-10-CM | POA: Diagnosis not present

## 2020-03-02 DIAGNOSIS — M545 Low back pain: Secondary | ICD-10-CM | POA: Diagnosis not present

## 2020-03-02 DIAGNOSIS — Z96641 Presence of right artificial hip joint: Secondary | ICD-10-CM | POA: Diagnosis not present

## 2020-03-02 DIAGNOSIS — M5441 Lumbago with sciatica, right side: Secondary | ICD-10-CM | POA: Diagnosis not present

## 2020-03-20 ENCOUNTER — Telehealth: Payer: Self-pay | Admitting: Adult Health

## 2020-03-20 NOTE — Telephone Encounter (Signed)
Pt is waking up sore every day and an in ofc visit is not available until 4/8. Could a virtual spot that is sooner possible be used for pt?

## 2020-03-21 ENCOUNTER — Encounter: Payer: Self-pay | Admitting: Adult Health

## 2020-03-21 ENCOUNTER — Telehealth (INDEPENDENT_AMBULATORY_CARE_PROVIDER_SITE_OTHER): Payer: MEDICARE | Admitting: Adult Health

## 2020-03-21 ENCOUNTER — Other Ambulatory Visit: Payer: Self-pay

## 2020-03-21 VITALS — Wt 118.0 lb

## 2020-03-21 DIAGNOSIS — M159 Polyosteoarthritis, unspecified: Secondary | ICD-10-CM

## 2020-03-21 DIAGNOSIS — M8949 Other hypertrophic osteoarthropathy, multiple sites: Secondary | ICD-10-CM

## 2020-03-21 MED ORDER — IBUPROFEN 400 MG PO TABS: 400.0000 mg | ORAL_TABLET | Freq: Every day | ORAL | 0 refills | Status: DC

## 2020-03-21 NOTE — Progress Notes (Signed)
Virtual Visit via Telephone Note  I connected with Alicia Boyd on 03/21/20 at  2:30 PM EDT by telephone and verified that I am speaking with the correct person using two identifiers.   I discussed the limitations, risks, security and privacy concerns of performing an evaluation and management service by telephone and the availability of in person appointments. I also discussed with the patient that there may be a patient responsible charge related to this service. The patient expressed understanding and agreed to proceed.  Location patient: home Location provider: work or home office Participants present for the call: patient, provider Patient did not have a visit in the prior 7 days to address this/these issue(s).   History of Present Illness: 86 year old female who  has a past medical history of Aortic heart murmur, Chest pain (2007 ; 2011), Hyperlipidemia, Hypertension ([), Interstitial cystitis, Ocular migraine (04/10/2012), Osteoporosis, and Postmenopausal.  She is being evaluated today for right sided hip and knee pain. She reports " I am finally feeling 86 years old". She reports that she feels stiff and painful when she wakes up in the morning and as she gets moving in the day the pain and stiffness reside. She has been taking tylenol in the evening and morning and reports that this works somewhat.      Observations/Objective: Patient sounds cheerful and well on the phone. I do not appreciate any SOB. Speech and thought processing are grossly intact. Patient reported vitals:  Assessment and Plan: 1. Primary osteoarthritis involving multiple joints - ibuprofen (ADVIL) 400 MG tablet; Take 1 tablet (400 mg total) by mouth at bedtime.  Dispense: 30 tablet; Refill: 0 - She will follow up if no improvement    Follow Up Instructions:    I did not refer this patient for an OV in the next 24 hours for this/these issue(s).  I discussed the assessment and treatment plan with the  patient. The patient was provided an opportunity to ask questions and all were answered. The patient agreed with the plan and demonstrated an understanding of the instructions.   The patient was advised to call back or seek an in-person evaluation if the symptoms worsen or if the condition fails to improve as anticipated.  I provided 18 minutes of non-face-to-face time during this encounter.   [name], NP

## 2020-03-21 NOTE — Telephone Encounter (Signed)
Telephone Visit has been scheduled.

## 2020-03-21 NOTE — Telephone Encounter (Signed)
Left a message for a return call.  Can be scheduled for a telephone visit.

## 2020-03-23 ENCOUNTER — Telehealth: Payer: Self-pay | Admitting: Adult Health

## 2020-03-23 NOTE — Telephone Encounter (Signed)
Linus Orn is calling from [facility] and would like to know if ibuprofen is okay for the pt to take with CHF pt state that she is very fearful of taking anything new that will make her drowsy since she is living alone.  Linus Orn would like to have a call back to let her know.

## 2020-03-23 NOTE — Telephone Encounter (Signed)
She can take ibuprofen for the next 3-4 days without any issues. This medication will not cause drowsiness

## 2020-03-23 NOTE — Telephone Encounter (Signed)
Tried to [facility] but the # provided goes to the [employer].  Unsure how to reach [place].

## 2020-03-23 NOTE — Telephone Encounter (Signed)
Called and left a detailed message on Tracey's (hospice nurse) voicemail.  Advised of the below message to [facility].  Advised a call back if she had further questions.

## 2020-03-23 NOTE — Telephone Encounter (Signed)
Alicia Boyd with [facility] # is [phone]

## 2020-03-25 ENCOUNTER — Other Ambulatory Visit: Payer: Self-pay | Admitting: Adult Health

## 2020-03-25 DIAGNOSIS — I10 Essential (primary) hypertension: Secondary | ICD-10-CM

## 2020-03-25 DIAGNOSIS — I5032 Chronic diastolic (congestive) heart failure: Secondary | ICD-10-CM

## 2020-03-27 IMAGING — CR LUMBAR SPINE - COMPLETE 4+ VIEW
5 series · 5 of 5 positions shown · non-contrast
Comparison: 03/12/2018

CLINICAL DATA: Recent fall with right-sided pain, initial encounter

EXAM:
LUMBAR SPINE - COMPLETE 4+ VIEW

[t lumbar spine ap]
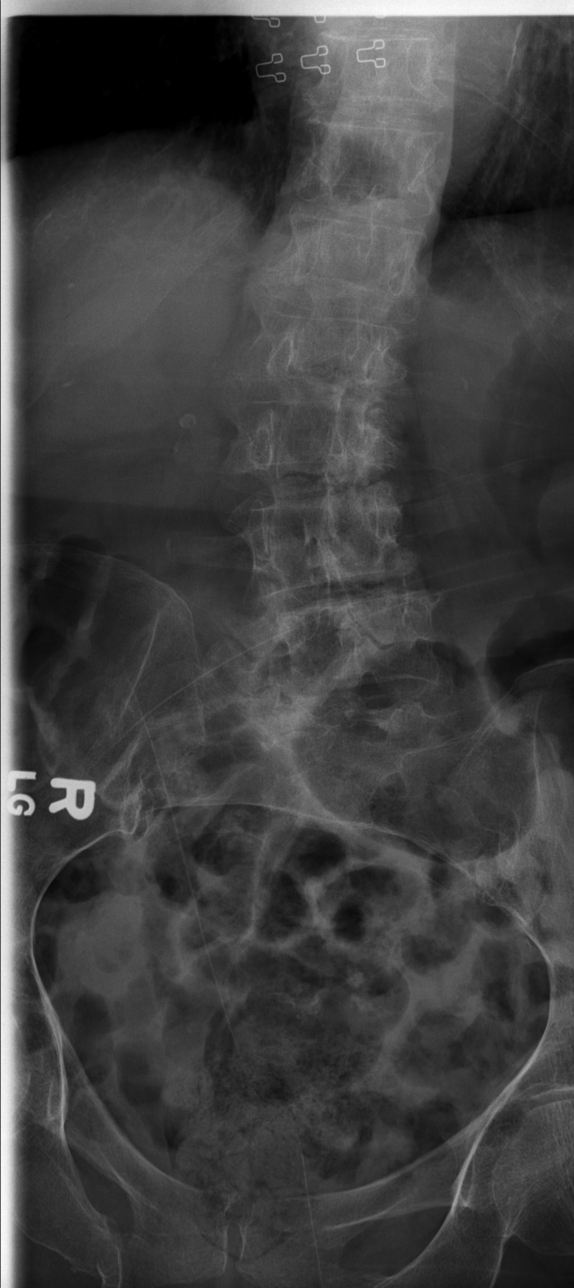

[t lumbar spine obl (1 of 2)]
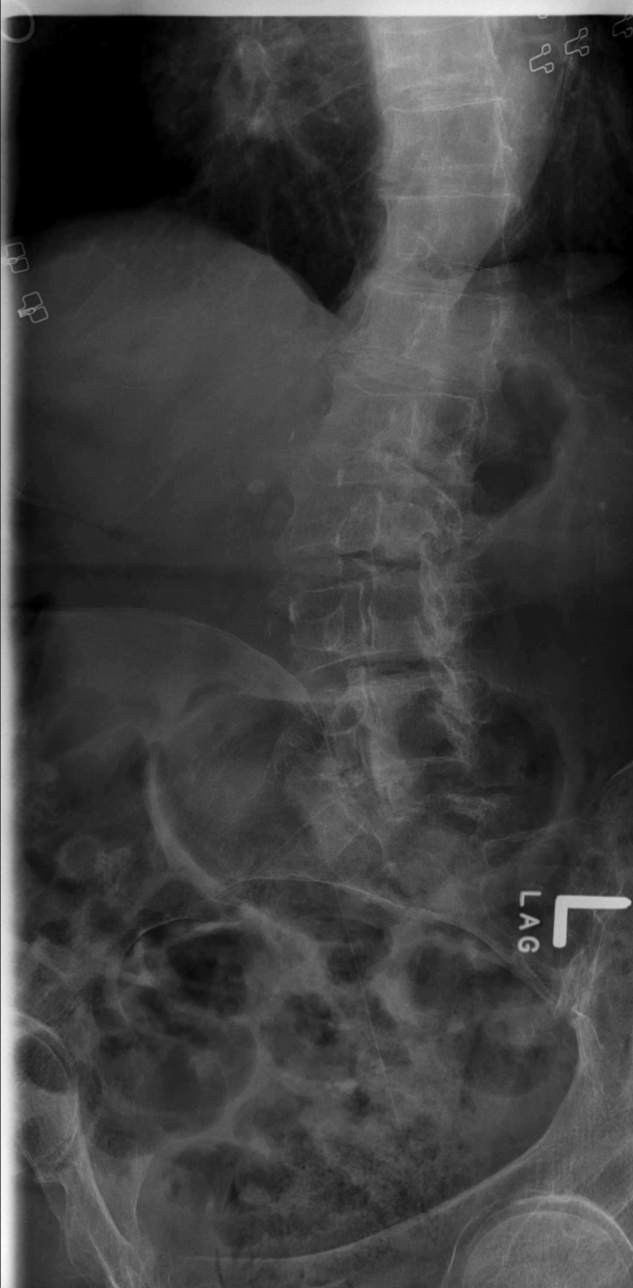

[t lumbar spine obl (2 of 2)]
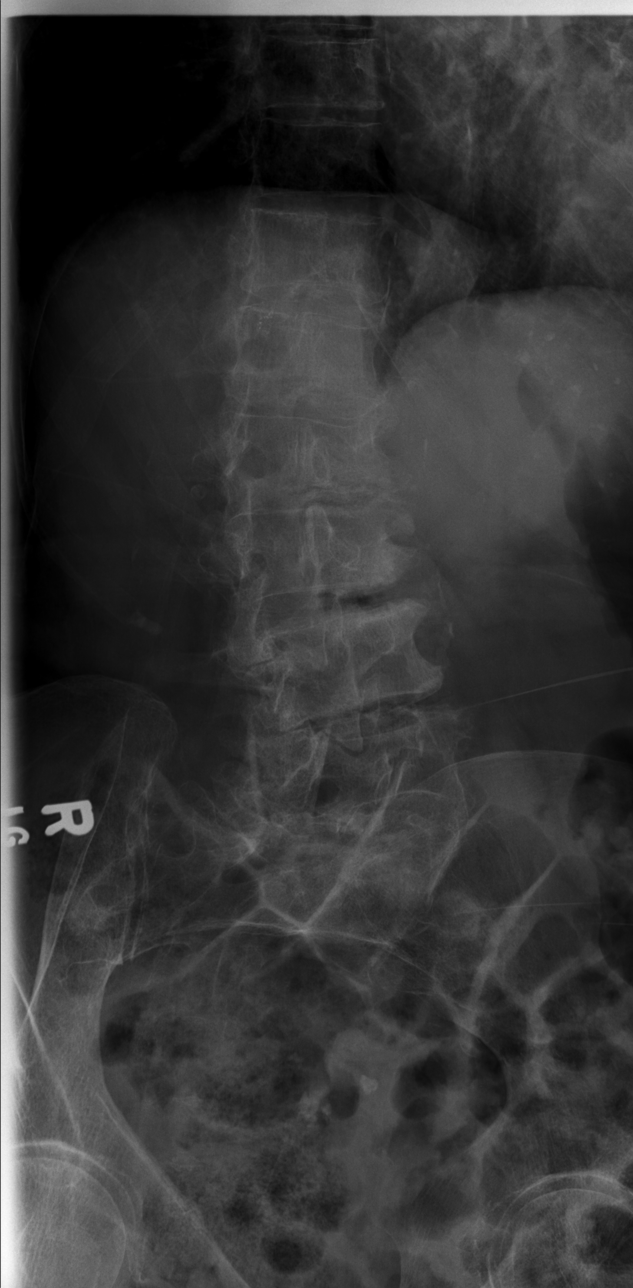

[t lumbar spine lat]
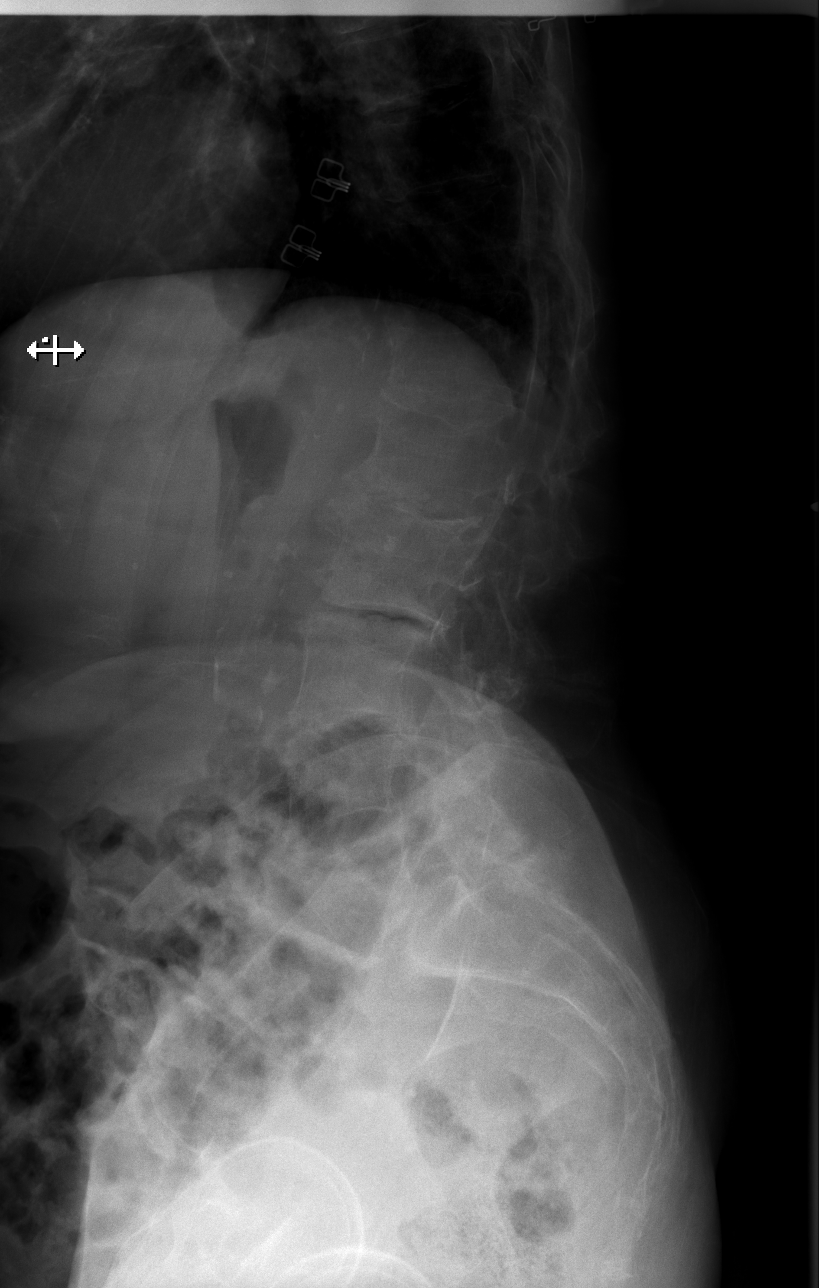

[t lumbar l-5 s-1 spot]
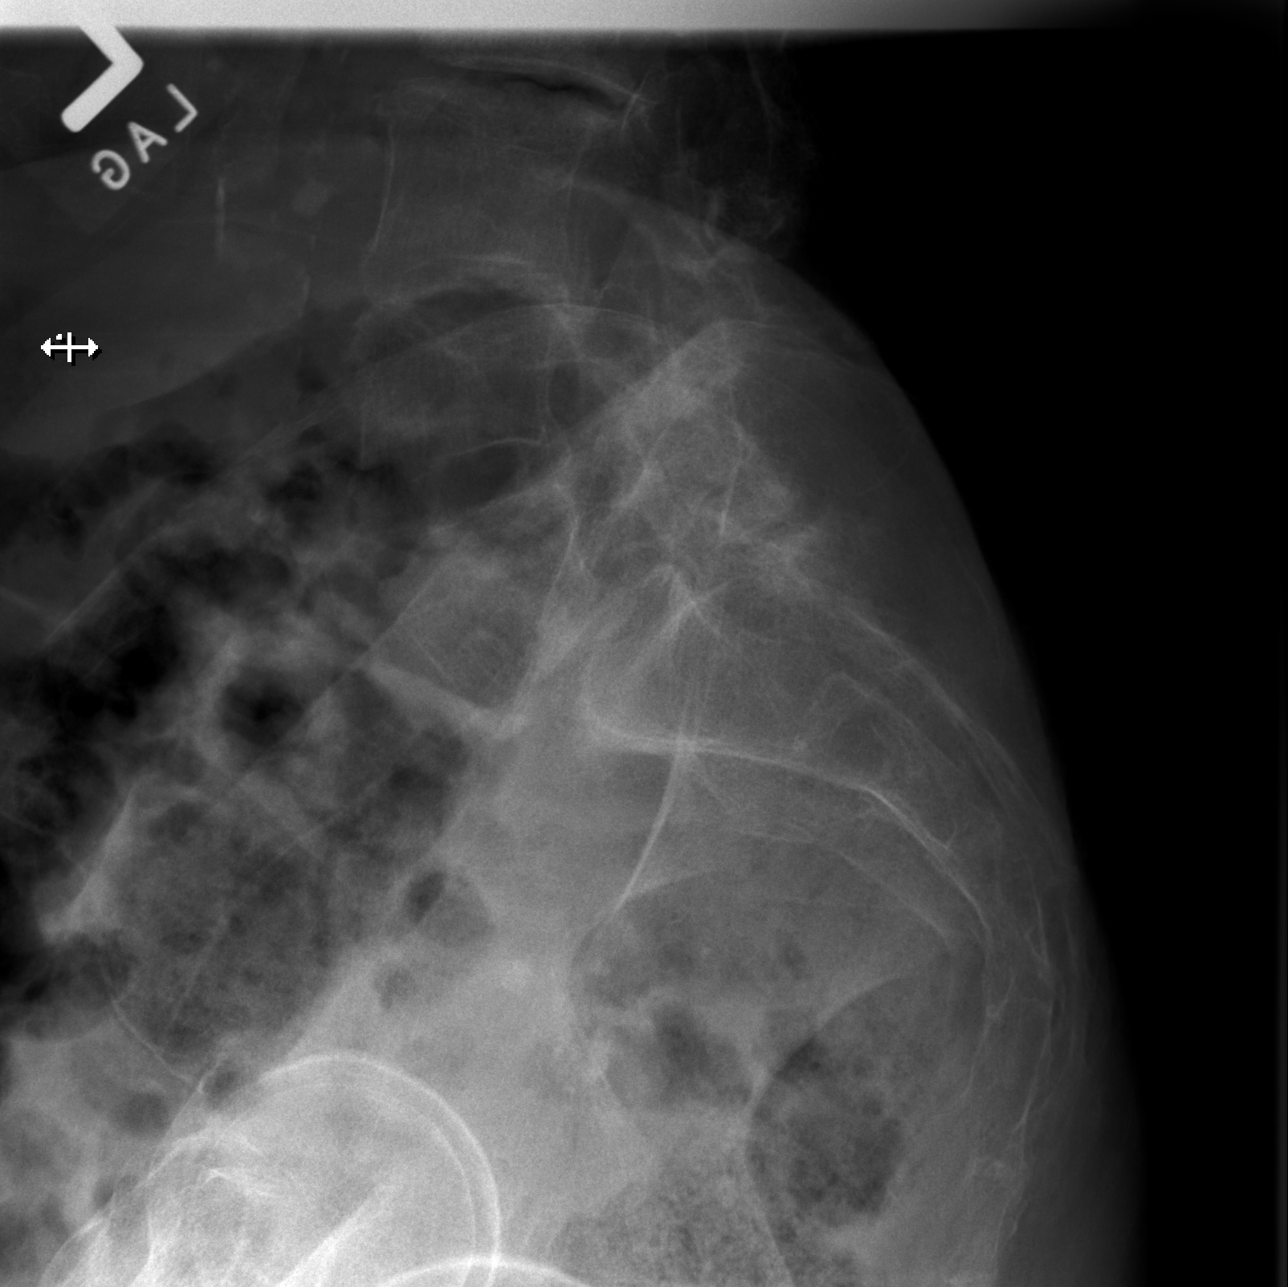

[5 of 5 positions shown; findings below may reference images not displayed]

FINDINGS: Five lumbar type vertebral bodies are well visualized. Mild
scoliosis concave to the left is noted stable from the previous
exam. Facet hypertrophic changes are identified. No compression
deformity is noted. Multilevel disc space narrowing is seen. Aortic
calcifications are noted without aneurysmal dilatation.
Anterolisthesis of L5 on S1 is noted.
IMPRESSION: Degenerative change without acute abnormality.

## 2020-03-27 NOTE — Telephone Encounter (Signed)
Medication Instructions:  DECREASE FUROSEMIDE TO 40 MG ONCE DAILY=MAY TAKE EXTRA 40 MG AS NEEDED FOR SWELLING  Changed by Cardiology.  Should I send to Dr. Stanford Breed?

## 2020-03-29 ENCOUNTER — Ambulatory Visit: Payer: MEDICARE | Attending: Internal Medicine

## 2020-03-29 DIAGNOSIS — Z23 Encounter for immunization: Secondary | ICD-10-CM

## 2020-03-29 NOTE — Progress Notes (Signed)
   Covid-19 Vaccination Clinic  Name:  Alicia Boyd    MRN: [id number] DOB: January 09, 1930  03/29/2020  [name] was observed post Covid-19 immunization for 30 minutes based on pre-vaccination screening without incident. She was provided with Vaccine Information Sheet and instruction to access the V-Safe system.   [name] was instructed to call 911 with any severe reactions post vaccine: Marland Kitchen Difficulty breathing  . Swelling of face and throat  . A fast heartbeat  . A bad rash all over body  . Dizziness and weakness   Immunizations Administered    Name Date Dose VIS Date Route   Pfizer COVID-19 Vaccine 03/29/2020 11:59 AM 0.3 mL 12/10/2019 Intramuscular   Manufacturer: [employer]   Lot: [id number]   [place]: [id number]

## 2020-04-05 DIAGNOSIS — M545 Low back pain: Secondary | ICD-10-CM | POA: Diagnosis not present

## 2020-04-17 ENCOUNTER — Telehealth: Payer: Self-pay | Admitting: Adult Health

## 2020-04-17 NOTE — Telephone Encounter (Signed)
[name] RN from [facility] is at the home with pt and wanted to let [name] know how the patient is feeling.   Pt is having some dizziness and nausea, she feels as if she is on a boat. Pt has no fever. Blood pressure sitting is 160/80 heart rate 72, blood pressure standing is 160/78 and 84 heart rate.   Pt started prescription celecoxid 200mg  1 in the morning about a week ago. [name]  Is not sure if this is causing the pt discomfort.    Advised that [name] is out of the office today as would pt be ok with seeing another provider if that is what is needed. [name] advised that pt does not feel like leaving the house and has declined going to the ER.   Pt will use her walker when walking and is drinking waster. She will also call comfort care to see if someone can come and stay with her.   Triage was not needed since she had a RN there with her assessing her symptoms.

## 2020-04-18 DIAGNOSIS — M7632 Iliotibial band syndrome, left leg: Secondary | ICD-10-CM | POA: Diagnosis not present

## 2020-04-18 DIAGNOSIS — Z9181 History of falling: Secondary | ICD-10-CM | POA: Diagnosis not present

## 2020-04-18 DIAGNOSIS — M7631 Iliotibial band syndrome, right leg: Secondary | ICD-10-CM | POA: Diagnosis not present

## 2020-04-18 NOTE — Telephone Encounter (Signed)
FYI

## 2020-04-19 ENCOUNTER — Telehealth: Payer: Self-pay | Admitting: Adult Health

## 2020-04-19 NOTE — Telephone Encounter (Signed)
Pt need a up-dated Fl-2 sent to [facility] at [employer] attn: Angelina Sheriff.

## 2020-04-19 NOTE — Telephone Encounter (Signed)
Celebrex can cause symptoms that she is experiencing including nausea, dizziness, and increase her blood pressure.  I would have her stop the Celebrex for the next couple days and see if her symptoms resolve.  If not then please follow-up in the office

## 2020-04-19 NOTE — Telephone Encounter (Signed)
Left message for patient to call back  

## 2020-04-20 DIAGNOSIS — M7631 Iliotibial band syndrome, right leg: Secondary | ICD-10-CM | POA: Diagnosis not present

## 2020-04-20 DIAGNOSIS — Z9181 History of falling: Secondary | ICD-10-CM | POA: Diagnosis not present

## 2020-04-20 DIAGNOSIS — M7632 Iliotibial band syndrome, left leg: Secondary | ICD-10-CM | POA: Diagnosis not present

## 2020-04-21 NOTE — Telephone Encounter (Signed)
Sent to [place] at fax # [phone].  Nothing further needed.

## 2020-04-21 NOTE — Telephone Encounter (Signed)
Spoke to the pt and informed her of below message from [place].  Pt states she has already stopped the medication and sx have stopped.  She is feeling much better.  Nothing further needed.

## 2020-04-25 ENCOUNTER — Telehealth: Payer: Self-pay | Admitting: Adult Health

## 2020-04-25 NOTE — Telephone Encounter (Signed)
Tywan from [facility] would like clarification on medication for pt.   Tylenol-confirm the frequency  Metoprolol-confirm 37.5 mg total   Also FL2 did not include what type of diet for pt.

## 2020-04-26 NOTE — Telephone Encounter (Signed)
Normal diet.   Tylenol 1000 mg every 6 hours PRN   Toprol 37.5 mg daily.

## 2020-04-27 ENCOUNTER — Encounter: Payer: Self-pay | Admitting: Family Medicine

## 2020-04-27 ENCOUNTER — Ambulatory Visit: Payer: MEDICARE | Admitting: Physical Therapy

## 2020-04-27 NOTE — Telephone Encounter (Signed)
Orders faxed to Tywan at [facility] @ [phone]

## 2020-05-09 ENCOUNTER — Encounter: Payer: Self-pay | Admitting: Family Medicine

## 2020-05-09 ENCOUNTER — Other Ambulatory Visit: Payer: Self-pay | Admitting: Family Medicine

## 2020-05-09 ENCOUNTER — Telehealth: Payer: Self-pay | Admitting: Adult Health

## 2020-05-09 NOTE — Telephone Encounter (Signed)
Left a message for a return call.

## 2020-05-09 NOTE — Telephone Encounter (Signed)
Anderson Malta from [facility] stated that th pt is showing a handful of signs for reflux. She complains that she has flem in her throat she can't clear, tires to burp but can't, hoarse voice, and Anderson Malta observed frequent throat clearing and coughing after eating a meal. Anderson Malta sees the pt has been diagnosed with GERD but has nothing prescribed for it. Anderson Malta is wondering if her PCP can call in a low dosage of medication he recommends.    PCP can call the facility she stays at, [facility], in order to send in the medication  Phone: [phone]   If any questions, Anderson Malta can be reached at [phone]

## 2020-05-09 NOTE — Telephone Encounter (Signed)
Spoke to [place] and order faxed to [phone] for omeprazole.  Nothing further needed.

## 2020-05-09 NOTE — Telephone Encounter (Signed)
Ok for omeprazole 20 mg daily x 1 year

## 2020-05-10 ENCOUNTER — Encounter: Payer: Self-pay | Admitting: Nurse Practitioner

## 2020-05-10 ENCOUNTER — Non-Acute Institutional Stay: Payer: MEDICARE | Admitting: Nurse Practitioner

## 2020-05-10 DIAGNOSIS — K219 Gastro-esophageal reflux disease without esophagitis: Secondary | ICD-10-CM | POA: Diagnosis not present

## 2020-05-10 DIAGNOSIS — F329 Major depressive disorder, single episode, unspecified: Secondary | ICD-10-CM

## 2020-05-10 DIAGNOSIS — I10 Essential (primary) hypertension: Secondary | ICD-10-CM

## 2020-05-10 DIAGNOSIS — R2681 Unsteadiness on feet: Secondary | ICD-10-CM

## 2020-05-10 DIAGNOSIS — I872 Venous insufficiency (chronic) (peripheral): Secondary | ICD-10-CM | POA: Diagnosis not present

## 2020-05-10 DIAGNOSIS — M25552 Pain in left hip: Secondary | ICD-10-CM | POA: Insufficient documentation

## 2020-05-10 DIAGNOSIS — M25551 Pain in right hip: Secondary | ICD-10-CM

## 2020-05-10 DIAGNOSIS — F419 Anxiety disorder, unspecified: Secondary | ICD-10-CM

## 2020-05-10 NOTE — Assessment & Plan Note (Signed)
ambulates with walker. S/p right hip fx( 09/03/19 hemiarthroplasty), pain if walks too much, prn Tylenol is available to her.

## 2020-05-10 NOTE — Assessment & Plan Note (Addendum)
Working with speech therapy, continue Omeprazole started yesterday, too soon to eval. Update CBC/diff.

## 2020-05-10 NOTE — Assessment & Plan Note (Addendum)
Continue Furosemide. Update CMP/eGFR

## 2020-05-10 NOTE — Assessment & Plan Note (Addendum)
Her mood is stable. Obtain TSH

## 2020-05-10 NOTE — Progress Notes (Signed)
Location:   [place] Room Number: 905-A Place of Service: [facility] Provider:  Lennie Odor Eriyonna Matsushita NP  [name], NP  Patient Care Team: [name], NP as PCP - General (Family Medicine) [name], MD as Consulting Physician (Orthopedic Surgery)  Extended Emergency Contact Information Primary Emergency Contact: cook,carolyn Mobile Phone: [phone] Relation: Friend Preferred language: English  Code Status:  DNR Goals of care: Advanced Directive information Advanced Directives 05/10/2020  Does Patient Have a Medical Advance Directive? Yes  Type of Advance Directive Out of facility DNR (pink MOST or yellow form);[place];Living will  Does patient want to make changes to medical advance directive? No - Patient declined  Copy of [place] in Chart? Yes - validated most recent copy scanned in chart (See row information)  Would patient like information on creating a medical advance directive? -  Pre-existing out of facility DNR order (yellow form or pink MOST form) Pink MOST form placed in chart (order not valid for inpatient use)     Chief Complaint  Patient presents with  . Medication Management    Patient is seen for medication review.    HPI:  Pt is a 86 y.o. female seen today for medical management of chronic diseases.    The patient resides in [place] [facility] for care, ambulates with walker. S/p right hip fx( 09/03/19 hemiarthroplasty), pain if walks too much, prn Tylenol is available to her. Hx of HTN, blood pressure is controlled on amlodipine 43m qd, Metoprolol 37.59mqd. Edema BLE, minimal, on Furosemide 4039md. GERD, stable, on Omeprazole 19m40m.    Past Medical History:  Diagnosis Date  . Aortic heart murmur    AS/AI on 2D ECHO ; SBE prophylaxis Rxed  . Chest pain 2007 ; 2011   negative cardiac assessment  . Hyperlipidemia   . Hypertension [  . Interstitial cystitis    Resolved  . Ocular migraine 04/10/2012  .  Osteoporosis    T score - 3.0 @ femoral neck; Fosamax affected swallowing  . Postmenopausal    No PMH or HRT   Past Surgical History:  Procedure Laterality Date  . ANTERIOR APPROACH HEMI HIP ARTHROPLASTY Right 09/03/2019   Procedure: ANTERIOR APPROACH HEMI HIP ARTHROPLASTY BIPOLAR;  Surgeon: GravDorna Leitz;  Location: [facility];  Service: Orthopedics;  Laterality: Right;  . COLONOSCOPY  2011   Dr PattSharlett IlesAllergies  Allergen Reactions  . Amitid [Amitriptyline Hcl]     Patient experienced a dizzy, spinning feeling   . Metronidazole     REACTION: throat swells  . Penicillins     REACTION: rash & fever  . Alendronate Sodium     REACTION: unspecified  . Clarithromycin     GI intolerance  . Flonase [Fluticasone Propionate]     Slight Headache   . Promethazine Hcl     REACTION: unspecified  . Robitussin (Alcohol Free) [Guaifenesin] Other (See Comments)    unknown hyperactive  . Spironolactone     09/21/13 weakness in legs  . Singulair [Montelukast Sodium]     "spacey"; ineffective    Allergies as of 05/10/2020      Reactions   Amitid [amitriptyline Hcl]    Patient experienced a dizzy, spinning feeling    Metronidazole    REACTION: throat swells   Penicillins    REACTION: rash & fever   Alendronate Sodium    REACTION: unspecified   Clarithromycin    GI intolerance   Flonase [  fluticasone Propionate]    Slight Headache    Promethazine Hcl    REACTION: unspecified   Robitussin (alcohol Free) [guaifenesin] Other (See Comments)   unknown hyperactive   Spironolactone    09/21/13 weakness in legs   Singulair [montelukast Sodium]    "spacey"; ineffective      Medication List       Accurate as of May 10, 2020 11:59 PM. If you have any questions, ask your nurse or doctor.        STOP taking these medications   Vitamin D3 25 MCG (1000 UT) Caps Stopped by: Rishabh Rinkenberger X Janique Hoefer, NP     TAKE these medications   acetaminophen 500 MG tablet Commonly known as: TYLENOL Take  1,000 mg by mouth every 6 (six) hours as needed for moderate pain.   amLODipine 5 MG tablet Commonly known as: NORVASC Take 1 tablet (5 mg total) by mouth every evening.   aspirin EC 81 MG tablet Take 1 tablet (81 mg total) by mouth daily.   docusate sodium 100 MG capsule Commonly known as: COLACE Take 100 mg by mouth 2 (two) times daily.   feeding supplement (ENSURE ENLIVE) Liqd Take 237 mLs by mouth 2 (two) times daily between meals.   fexofenadine 180 MG tablet Commonly known as: ALLEGRA Take 180 mg by mouth as needed for allergies or rhinitis.   furosemide 40 MG tablet Commonly known as: LASIX Take 1 tablet (40 mg total) by mouth daily.   metoprolol succinate 25 MG 24 hr tablet Commonly known as: TOPROL-XL TAKE 1.5 TABLETS (37.5 MG TOTAL) BY MOUTH DAILY.   multivitamin tablet Take 1 tablet by mouth daily. chewable   omeprazole 20 MG capsule Commonly known as: PRILOSEC Take 20 mg by mouth daily.       Review of Systems  Constitutional: Negative for activity change, appetite change, chills, diaphoresis, fatigue and fever.  HENT: Positive for hearing loss. Negative for congestion, trouble swallowing and voice change.   Eyes: Negative for visual disturbance.  Respiratory: Negative for cough, shortness of breath and wheezing.   Cardiovascular: Positive for leg swelling. Negative for chest pain and palpitations.  Gastrointestinal: Negative for abdominal distention, abdominal pain, constipation, diarrhea, nausea and vomiting.       GERD  Genitourinary: Negative for difficulty urinating, dysuria and urgency.       Average nocturnal urination 2x/night.   Musculoskeletal: Positive for arthralgias and gait problem.       R hip pain   Skin: Negative for color change and pallor.  Neurological: Negative for dizziness, speech difficulty, weakness and headaches.  Psychiatric/Behavioral: Negative for agitation, behavioral problems, hallucinations and sleep disturbance. The  patient is not nervous/anxious.     Immunization History  Administered Date(s) Administered  . Fluad Quad(high Dose 65+) 10/05/2019  . Influenza Split 12/10/2011, 09/29/2012  . Influenza, High Dose Seasonal PF 09/21/2014, 08/27/2016, 08/27/2017, 09/23/2018  . Influenza,inj,Quad PF,6+ Mos 10/08/2013, 09/12/2015  . PFIZER SARS-COV-2 Vaccination 02/28/2020, 03/29/2020  . Pneumococcal Conjugate-13 08/22/2015  . Pneumococcal Polysaccharide-23 11/23/2008  . Td 03/23/2004  . Tdap 07/12/2015   Pertinent  Health Maintenance Due  Topic Date Due  . DEXA SCAN  Never done  . INFLUENZA VACCINE  07/30/2020  . PNA vac Low Risk Adult  Completed   Fall Risk  09/23/2018 09/19/2017 09/04/2016 08/20/2016 12/20/2014  Falls in the past year? _0   Comment - - Emmi Telephone Survey: data to providers prior to load - -   Functional Status  Survey:    Vitals:   05/10/20 1550  BP: 132/66  Pulse: 72  Resp: (!) 24  Temp: 97.9 F (36.6 C)  TempSrc: Oral  SpO2: 95%  Weight: 122 lb 3.2 oz (55.4 kg)  Height: _0  (1.626 m)   Body mass index is 20.98 kg/m. Physical Exam Vitals and nursing note reviewed.  Constitutional:      General: She is not in acute distress.    Appearance: Normal appearance. She is not ill-appearing, toxic-appearing or diaphoretic.  HENT:     Head: Normocephalic and atraumatic.     Nose: Nose normal.     Mouth/Throat:     Mouth: Mucous membranes are moist.  Eyes:     Extraocular Movements: Extraocular movements intact.     Conjunctiva/sclera: Conjunctivae normal.     Pupils: Pupils are equal, round, and reactive to light.  Cardiovascular:     Rate and Rhythm: Normal rate and regular rhythm.     Heart sounds: No murmur.  Pulmonary:     Effort: Pulmonary effort is normal.     Breath sounds: No wheezing, rhonchi or rales.  Abdominal:     General: Bowel sounds are normal. There is no distension.     Palpations: Abdomen is soft.     Tenderness: There is no  abdominal tenderness. There is no right CVA tenderness, left CVA tenderness, guarding or rebound.  Musculoskeletal:     Cervical back: Normal range of motion and neck supple.     Right lower leg: Edema present.     Left lower leg: Edema present.     Comments: Minimal trace edema BLE  Skin:    General: Skin is warm and dry.  Neurological:     General: No focal deficit present.     Mental Status: She is alert and oriented to person, place, and time. Mental status is at baseline.     Motor: No weakness.     Coordination: Coordination normal.     Gait: Gait abnormal.  Psychiatric:        Mood and Affect: Mood normal.        Behavior: Behavior normal.        Thought Content: Thought content normal.        Judgment: Judgment normal.     Labs reviewed: Recent Labs    09/08/19 0843 10/03/19 1930 10/03/19 2359 10/04/19 0511 10/05/19 0511 10/07/19 0442 10/07/19 0442 11/03/19 1411 02/15/20 1448 02/23/20 1540  NA 137   < >  --  139   < > 139   < > 137 136 139  K 3.7   < >  --  3.6   < > 3.2*   < > 3.9 5.3* 4.7  CL 101   < >  --  105   < > 99   < > 97 95* 98  CO2 28   < >  --  29   < > 30   < > 35* 29 26  GLUCOSE 132*   < >  --  116*   < > 97   < > 94 86 80  BUN 17   < >  --  10   < > 21   < > _1 CREATININE 0.37*   < >  --  0.35*   < > 0.44   < > 0.61 0.58 0.64  CALCIUM 8.8*   < >  --  8.6*   < > 8.6*   < >  9.5 9.9 9.7  MG 2.2  --  2.0 1.9  --  2.0  --   --   --   --   PHOS 4.1  --   --   --   --  3.7  --   --   --   --    < > = values in this interval not displayed.   Recent Labs    09/08/19 0843 10/03/19 1930 10/07/19 0442  AST 36 16  --   ALT 81* 21  --   ALKPHOS 247* 127*  --   BILITOT 0.9 0.6  --   PROT 5.3* 6.1*  --   ALBUMIN 2.7* 3.6 2.8*   Recent Labs    09/08/19 0843 09/08/19 0843 10/03/19 1930 10/04/19 0511 11/03/19 1411  WBC 6.4   < > 7.3 6.0 6.3  NEUTROABS 4.7  --  5.3  --  4.7  HGB 10.5*   < > 11.2* 9.7* 11.3*  HCT 32.3*   < > 34.9* 30.2*  33.8*  MCV 99.7   < > 97.8 96.8 93.4  PLT 287   < > 297 268 249.0   < > = values in this interval not displayed.   Lab Results  Component Value Date   TSH 2.526 10/03/2019   Lab Results  Component Value Date   HGBA1C 5.5 07/12/2015   Lab Results  Component Value Date   CHOL 227 (H) 06/24/2013   HDL 89.10 06/24/2013   LDLDIRECT 114.7 06/24/2013   TRIG 80.0 06/24/2013   CHOLHDL 3 06/24/2013    Significant Diagnostic Results in last 30 days:  No results found.  Assessment/Plan  Essential hypertension Blood pressure is controlled, continue Amlodipine, Metoprolol.   Venous (peripheral) insufficiency Continue Furosemide. Update CMP/eGFR  ESOPHAGEAL REFLUX Working with speech therapy, continue Omeprazole started yesterday, too soon to eval. Update CBC/diff.   Anxiety and depression Her mood is stable. Obtain TSH  Right hip pain ambulates with walker. S/p right hip fx( 09/03/19 hemiarthroplasty), pain if walks too much, prn Tylenol is available to her.  Unsteady gait Ambulates with walker.    Family/ staff Communication: plan of care reviewed with the patient and charge nurse.   Labs/tests ordered:  CBC/diff, CMP/eGFR, TSH   Time spend 40 minutes.

## 2020-05-10 NOTE — Assessment & Plan Note (Signed)
Blood pressure is controlled, continue Amlodipine, Metoprolol.   

## 2020-05-10 NOTE — Assessment & Plan Note (Signed)
Ambulates with walker.  

## 2020-05-11 ENCOUNTER — Encounter: Payer: Self-pay | Admitting: Nurse Practitioner

## 2020-05-11 DIAGNOSIS — I10 Essential (primary) hypertension: Secondary | ICD-10-CM | POA: Diagnosis not present

## 2020-05-11 LAB — BASIC METABOLIC PANEL
BUN: 10 (ref 4–21)
CO2: 33 — AB (ref 13–22)
Chloride: 103 (ref 99–108)
Creatinine: 0.5 (ref 0.5–1.1)
Glucose: 93
Potassium: 3.9 (ref 3.4–5.3)
Sodium: 141 (ref 137–147)

## 2020-05-11 LAB — COMPREHENSIVE METABOLIC PANEL
Albumin: 3.7 (ref 3.5–5.0)
Calcium: 9.3 (ref 8.7–10.7)
Globulin: 1.5

## 2020-05-11 LAB — TSH: TSH: 1.9 (ref 0.41–5.90)

## 2020-05-11 LAB — CBC AND DIFFERENTIAL
HCT: 36 (ref 36–46)
Hemoglobin: 12.2 (ref 12.0–16.0)
Neutrophils Absolute: 3523
Platelets: 239 (ref 150–399)
WBC: 5.7

## 2020-05-11 LAB — HEPATIC FUNCTION PANEL
ALT: 10 (ref 7–35)
AST: 10 — AB (ref 13–35)
Alkaline Phosphatase: 72 (ref 25–125)
Bilirubin, Total: 0.5

## 2020-05-11 LAB — CBC: RBC: 3.87 (ref 3.87–5.11)

## 2020-05-16 ENCOUNTER — Encounter: Payer: Self-pay | Admitting: Internal Medicine

## 2020-05-16 ENCOUNTER — Non-Acute Institutional Stay: Payer: MEDICARE | Admitting: Internal Medicine

## 2020-05-16 ENCOUNTER — Other Ambulatory Visit: Payer: Self-pay | Admitting: Adult Health

## 2020-05-16 DIAGNOSIS — M810 Age-related osteoporosis without current pathological fracture: Secondary | ICD-10-CM | POA: Diagnosis not present

## 2020-05-16 DIAGNOSIS — F329 Major depressive disorder, single episode, unspecified: Secondary | ICD-10-CM

## 2020-05-16 DIAGNOSIS — K219 Gastro-esophageal reflux disease without esophagitis: Secondary | ICD-10-CM | POA: Diagnosis not present

## 2020-05-16 DIAGNOSIS — I509 Heart failure, unspecified: Secondary | ICD-10-CM | POA: Diagnosis not present

## 2020-05-16 DIAGNOSIS — F419 Anxiety disorder, unspecified: Secondary | ICD-10-CM

## 2020-05-16 DIAGNOSIS — I10 Essential (primary) hypertension: Secondary | ICD-10-CM

## 2020-05-16 NOTE — Progress Notes (Signed)
Location:  [place] Room Number: 905-A Place of Service:  SNF [phone]) Provider:  Veleta Miners, MD   Patient Care Team: [name], NP as PCP - General (Family Medicine) [name], MD as Consulting Physician (Orthopedic Surgery)  Extended Emergency Contact Information Primary Emergency Contact: cook,carolyn Mobile Phone: [phone] Relation: Friend Preferred language: English  Code Status:  FULL CODE Goals of care: Advanced Directive information Advanced Directives 05/10/2020  Does Patient Have a Medical Advance Directive? Yes  Type of Advance Directive Out of facility DNR (pink MOST or yellow form);[place];Living will  Does patient want to make changes to medical advance directive? No - Patient declined  Copy of [place] in Chart? Yes - validated most recent copy scanned in chart (See row information)  Would patient like information on creating a medical advance directive? -  Pre-existing out of facility DNR order (yellow form or pink MOST form) Pink MOST form placed in chart (order not valid for inpatient use)     Chief Complaint  Patient presents with  . Acute Visit    HPI:  Pt is an 86 y.o. female seen today for an acute visit for [facility] Admit to [facility]  [facility] [place] [facility].  Patient states that she was unable to take care of herself in her house and decided to move to friend's home. Patient lives by herself after loss of her husband.  Does not have any children.  Does have nieces and nephews who help her.  Has history of diastolic CHF with echo showing LVH and severe left atrial enlargement possible amyloidosis, hypertension, history of palpitations,CAD, history of right hip fracture in 9/20, osteoporosis and anemia, hypertensive retinopathy  Patient did not have any acute complaints today.  She said she was little anxious because of the change.  She walks with a walker denies any  pain   Past Medical History:  Diagnosis Date  . Aortic heart murmur    AS/AI on 2D ECHO ; SBE prophylaxis Rxed  . Chest pain 2007 ; 2011   negative cardiac assessment  . Hyperlipidemia   . Hypertension [  . Interstitial cystitis    Resolved  . Ocular migraine 04/10/2012  . Osteoporosis    T score - 3.0 @ femoral neck; Fosamax affected swallowing  . Postmenopausal    No PMH or HRT   Past Surgical History:  Procedure Laterality Date  . ANTERIOR APPROACH HEMI HIP ARTHROPLASTY Right 09/03/2019   Procedure: ANTERIOR APPROACH HEMI HIP ARTHROPLASTY BIPOLAR;  Surgeon: [name], MD;  Location: [facility];  Service: Orthopedics;  Laterality: Right;  . COLONOSCOPY  2011   Dr Sharlett Iles    Allergies  Allergen Reactions  . Amitid [Amitriptyline Hcl]     Patient experienced a dizzy, spinning feeling   . Metronidazole     REACTION: throat swells  . Penicillins     REACTION: rash & fever  . Alendronate Sodium     REACTION: unspecified  . Clarithromycin     GI intolerance  . Flonase [Fluticasone Propionate]     Slight Headache   . Promethazine Hcl     REACTION: unspecified  . Robitussin (Alcohol Free) [Guaifenesin] Other (See Comments)    unknown hyperactive  . Spironolactone     09/21/13 weakness in legs  . Singulair [Montelukast Sodium]     "spacey"; ineffective    Outpatient Encounter Medications as of 05/16/2020  Medication Sig  . acetaminophen (TYLENOL)  500 MG tablet Take 1,000 mg by mouth every 6 (six) hours as needed for moderate pain.  Marland Kitchen amLODipine (NORVASC) 5 MG tablet Take 1 tablet (5 mg total) by mouth every evening.  Marland Kitchen aspirin EC 81 MG tablet Take 1 tablet (81 mg total) by mouth daily.  Marland Kitchen docusate sodium (COLACE) 100 MG capsule Take 100 mg by mouth 2 (two) times daily.  . fexofenadine (ALLEGRA) 180 MG tablet Take 180 mg by mouth as needed for allergies or rhinitis.  . furosemide (LASIX) 40 MG tablet Take 1 tablet (40 mg total) by mouth daily.  Marland Kitchen lactose free nutrition  (BOOST PLUS) LIQD Take 237 mLs by mouth daily.  . metoprolol succinate (TOPROL-XL) 25 MG 24 hr tablet TAKE 1.5 TABLETS (37.5 MG TOTAL) BY MOUTH DAILY.  . Multiple Vitamin (MULTIVITAMIN) tablet Take 1 tablet by mouth daily. chewable  . omeprazole (PRILOSEC) 20 MG capsule Take 20 mg by mouth daily.  . [DISCONTINUED] feeding supplement, ENSURE ENLIVE, (ENSURE ENLIVE) LIQD Take 237 mLs by mouth 2 (two) times daily between meals.   No facility-administered encounter medications on file as of 05/16/2020.    Review of Systems  Review of Systems  Constitutional: Negative for activity change, appetite change, chills, diaphoresis, fatigue and fever.  HENT: Negative for mouth sores, postnasal drip, rhinorrhea, sinus pain and sore throat.   Respiratory: Negative for apnea, cough, chest tightness, shortness of breath and wheezing.   Cardiovascular: Negative for chest pain, palpitations and leg swelling.  Gastrointestinal: Negative for abdominal distention, abdominal pain, constipation, diarrhea, nausea and vomiting.  Genitourinary: Negative for dysuria and frequency.  Musculoskeletal: Negative for arthralgias, joint swelling and myalgias.  Skin: Negative for rash.  Neurological: Negative for dizziness, syncope, weakness, light-headedness and numbness.  Psychiatric/Behavioral: Negative for behavioral problems, confusion and sleep disturbance.     Immunization History  Administered Date(s) Administered  . Fluad Quad(high Dose 65+) 10/05/2019  . Influenza Split 12/10/2011, 09/29/2012  . Influenza, High Dose Seasonal PF 09/21/2014, 08/27/2016, 08/27/2017, 09/23/2018  . Influenza,inj,Quad PF,6+ Mos 10/08/2013, 09/12/2015  . PFIZER SARS-COV-2 Vaccination 02/28/2020, 03/29/2020  . Pneumococcal Conjugate-13 08/22/2015  . Pneumococcal Polysaccharide-23 11/23/2008  . Td 03/23/2004  . Tdap 07/12/2015   Pertinent  Health Maintenance Due  Topic Date Due  . DEXA SCAN  Never done  . INFLUENZA VACCINE   07/30/2020  . PNA vac Low Risk Adult  Completed   Fall Risk  09/23/2018 09/19/2017 09/04/2016 08/20/2016 12/20/2014  Falls in the past year? No No No No No  Comment - - Emmi Telephone Survey: data to providers prior to load - -   Functional Status Survey:    Vitals:   05/16/20 1448  BP: 132/74  Pulse: 64  Resp: 16  Temp: 97.7 F (36.5 C)  TempSrc: Oral  SpO2: 96%  Weight: 122 lb 3.2 oz (55.4 kg)  Height: 5\' 4"  (1.626 m)   Body mass index is 20.98 kg/m. Physical Exam  Constitutional: Oriented to person, place, and time. Well-developed and well-nourished.  HENT:  Head: Normocephalic.  Mouth/Throat: Oropharynx is clear and moist.  Eyes: Pupils are equal, round, and reactive to light.  Neck: Neck supple.  Cardiovascular: Normal rate and normal heart sounds.  Positive for murmur  Pulmonary/Chest: Effort normal and breath sounds normal. No respiratory distress. No wheezes. She has no rales.  Abdominal: Soft. Bowel sounds are normal. No distension. There is no tenderness. There is no rebound.  Musculoskeletal: Mild edema Bilateral  Lymphadenopathy: none Neurological: Alert and oriented to person,  place, and time.  Skin: Skin is warm and dry.  Psychiatric: Normal mood and affect. Behavior is normal. Thought content normal.    Labs reviewed: Recent Labs    09/08/19 0843 10/03/19 1930 10/03/19 2359 10/04/19 0511 10/05/19 0511 10/07/19 0442 10/07/19 0442 11/03/19 1411 02/15/20 1448 02/23/20 1540  NA 137   < >  --  139   < > 139   < > 137 136 139  K 3.7   < >  --  3.6   < > 3.2*   < > 3.9 5.3* 4.7  CL 101   < >  --  105   < > 99   < > 97 95* 98  CO2 28   < >  --  29   < > 30   < > 35* 29 26  GLUCOSE 132*   < >  --  116*   < > 97   < > 94 86 80  BUN 17   < >  --  10   < > 21   < > 22 19 15   CREATININE 0.37*   < >  --  0.35*   < > 0.44   < > 0.61 0.58 0.64  CALCIUM 8.8*   < >  --  8.6*   < > 8.6*   < > 9.5 9.9 9.7  MG 2.2  --  2.0 1.9  --  2.0  --   --   --   --   PHOS  4.1  --   --   --   --  3.7  --   --   --   --    < > = values in this interval not displayed.   Recent Labs    09/08/19 0843 10/03/19 1930 10/07/19 0442  AST 36 16  --   ALT 81* 21  --   ALKPHOS 247* 127*  --   BILITOT 0.9 0.6  --   PROT 5.3* 6.1*  --   ALBUMIN 2.7* 3.6 2.8*   Recent Labs    09/08/19 0843 09/08/19 0843 10/03/19 1930 10/04/19 0511 11/03/19 1411  WBC 6.4   < > 7.3 6.0 6.3  NEUTROABS 4.7  --  5.3  --  4.7  HGB 10.5*   < > 11.2* 9.7* 11.3*  HCT 32.3*   < > 34.9* 30.2* 33.8*  MCV 99.7   < > 97.8 96.8 93.4  PLT 287   < > 297 268 249.0   < > = values in this interval not displayed.   Lab Results  Component Value Date   TSH 2.526 10/03/2019   Lab Results  Component Value Date   HGBA1C 5.5 07/12/2015   Lab Results  Component Value Date   CHOL 227 (H) 06/24/2013   HDL 89.10 06/24/2013   LDLDIRECT 114.7 06/24/2013   TRIG 80.0 06/24/2013   CHOLHDL 3 06/24/2013    Significant Diagnostic Results in last 30 days:  No results found.  Assessment/Plan  Essential hypertension Stable on Norvasc Chronic congestive heart failure, On Lasix Continue to follow her weight here H/o Palpitations Stable on Toprol XL Anxiety and depression Does not want anything right now Will continue to follow Gastroesophageal reflux disease without esophagitis On Prilosec Age-related osteoporosis without current pathological fracture Has failed Fosamax ? Consider Prolia Arthritis Tylenol PRn  Family/ staff Communication:   Labs/tests ordered:   Total time spent in this patient care encounter was 45 _  minutes;  greater than 50% of the visit spent counseling patient and staff, reviewing records , Labs and coordinating care for problems addressed at this encounter.

## 2020-05-25 ENCOUNTER — Encounter: Payer: MEDICARE | Admitting: Nurse Practitioner

## 2020-06-08 DIAGNOSIS — M7631 Iliotibial band syndrome, right leg: Secondary | ICD-10-CM | POA: Diagnosis not present

## 2020-06-19 ENCOUNTER — Non-Acute Institutional Stay: Payer: MEDICARE | Admitting: Nurse Practitioner

## 2020-06-19 ENCOUNTER — Encounter: Payer: Self-pay | Admitting: Nurse Practitioner

## 2020-06-19 DIAGNOSIS — I10 Essential (primary) hypertension: Secondary | ICD-10-CM | POA: Diagnosis not present

## 2020-06-19 DIAGNOSIS — L239 Allergic contact dermatitis, unspecified cause: Secondary | ICD-10-CM

## 2020-06-19 DIAGNOSIS — L259 Unspecified contact dermatitis, unspecified cause: Secondary | ICD-10-CM | POA: Insufficient documentation

## 2020-06-19 DIAGNOSIS — I872 Venous insufficiency (chronic) (peripheral): Secondary | ICD-10-CM

## 2020-06-19 DIAGNOSIS — K219 Gastro-esophageal reflux disease without esophagitis: Secondary | ICD-10-CM | POA: Diagnosis not present

## 2020-06-19 NOTE — Assessment & Plan Note (Signed)
Stable, continue Omeprazole.  

## 2020-06-19 NOTE — Assessment & Plan Note (Signed)
No apparent edema BLE, continue Furosemide 40mg  qd

## 2020-06-19 NOTE — Assessment & Plan Note (Signed)
Blood pressure is controlled, continue Metoprolol, Amlodipine.  

## 2020-06-19 NOTE — Progress Notes (Signed)
Location:   [facility] Room Number: [place]:  ALF (13) Provider: Marlana Latus NP  [name], NP  Patient Care Team: [name], NP as PCP - General (Family Medicine) [name], MD as Consulting Physician (Orthopedic Surgery)  Extended Emergency Contact Information Primary Emergency Contact: cook,carolyn Mobile Phone: [phone] Relation: Friend Preferred language: English  Code Status: DNR Goals of care: Advanced Directive information Advanced Directives 05/10/2020  Does Patient Have a Medical Advance Directive? Yes  Type of Advance Directive Out of facility DNR (pink MOST or yellow form);[place];Living will  Does patient want to make changes to medical advance directive? No - Patient declined  Copy of [place] in Chart? Yes - validated most recent copy scanned in chart (See row information)  Would patient like information on creating a medical advance directive? -  Pre-existing out of facility DNR order (yellow form or pink MOST form) Pink MOST form placed in chart (order not valid for inpatient use)     Chief Complaint  Patient presents with  . Acute Visit    left leg rash    HPI:  Pt is a 86 y.o. female seen today for an acute visit for left shin rash, itching, started from a one spot, then scratched injury in the area about her hand size, slightly warm, no pain in calf or affected area, no noted drainage or swelling. The patient denied new garment, detergent, or bedding.   Hx of HTN, blood pressure is controlled, on amlodipine 5mg  qd, Metoprolol 37.5mg  qd.   PVD, peripheral edema, on Furosemide 40mg  qd.   GERD, stable, on Omeprazole.    Past Medical History:  Diagnosis Date  . Aortic heart murmur    AS/AI on 2D ECHO ; SBE prophylaxis Rxed  . Chest pain 2007 ; 2011   negative cardiac assessment  . Hyperlipidemia   . Hypertension [  . Interstitial cystitis    Resolved  .  Ocular migraine 04/10/2012  . Osteoporosis    T score - 3.0 @ femoral neck; Fosamax affected swallowing  . Postmenopausal    No PMH or HRT   Past Surgical History:  Procedure Laterality Date  . ANTERIOR APPROACH HEMI HIP ARTHROPLASTY Right 09/03/2019   Procedure: ANTERIOR APPROACH HEMI HIP ARTHROPLASTY BIPOLAR;  Surgeon: [name], MD;  Location: [facility];  Service: Orthopedics;  Laterality: Right;  . COLONOSCOPY  2011   Dr Sharlett Iles    Allergies  Allergen Reactions  . Amitid [Amitriptyline Hcl]     Patient experienced a dizzy, spinning feeling   . Metronidazole     REACTION: throat swells  . Penicillins     REACTION: rash & fever  . Alendronate Sodium     REACTION: unspecified  . Clarithromycin     GI intolerance  . Flonase [Fluticasone Propionate]     Slight Headache   . Promethazine Hcl     REACTION: unspecified  . Robitussin (Alcohol Free) [Guaifenesin] Other (See Comments)    unknown hyperactive  . Spironolactone     09/21/13 weakness in legs  . Singulair [Montelukast Sodium]     "spacey"; ineffective    Allergies as of 06/19/2020      Reactions   Amitid [amitriptyline Hcl]    Patient experienced a dizzy, spinning feeling    Metronidazole    REACTION: throat swells   Penicillins    REACTION: rash & fever   Alendronate Sodium    REACTION: unspecified  Clarithromycin    GI intolerance   Flonase [fluticasone Propionate]    Slight Headache    Promethazine Hcl    REACTION: unspecified   Robitussin (alcohol Free) [guaifenesin] Other (See Comments)   unknown hyperactive   Spironolactone    09/21/13 weakness in legs   Singulair [montelukast Sodium]    "spacey"; ineffective      Medication List       Accurate as of June 19, 2020 11:59 PM. If you have any questions, ask your nurse or doctor.        acetaminophen 500 MG tablet Commonly known as: TYLENOL Take 1,000 mg by mouth every 6 (six) hours as needed for moderate pain.   alum & mag  hydroxide-simeth 200-200-20 MG/5ML suspension Commonly known as: MAALOX/MYLANTA Take by mouth every 4 (four) hours as needed for indigestion or heartburn.   amLODipine 5 MG tablet Commonly known as: NORVASC Take 1 tablet (5 mg total) by mouth every evening.   aspirin EC 81 MG tablet Take 1 tablet (81 mg total) by mouth daily.   celecoxib 200 MG capsule Commonly known as: CELEBREX Take 200 mg by mouth daily.   docusate sodium 100 MG capsule Commonly known as: COLACE Take 100 mg by mouth 2 (two) times daily as needed.   fexofenadine 180 MG tablet Commonly known as: ALLEGRA Take 180 mg by mouth as needed for allergies or rhinitis.   furosemide 40 MG tablet Commonly known as: LASIX Take 1 tablet (40 mg total) by mouth daily.   lactose free nutrition Liqd Take 237 mLs by mouth daily.   metoprolol succinate 25 MG 24 hr tablet Commonly known as: TOPROL-XL TAKE 1.5 TABLETS (37.5 MG TOTAL) BY MOUTH DAILY.   multivitamin tablet Take 1 tablet by mouth daily. chewable   omeprazole 20 MG capsule Commonly known as: PRILOSEC Take 20 mg by mouth daily.       Review of Systems  Constitutional: Negative for appetite change, fatigue and fever.  HENT: Positive for hearing loss. Negative for congestion, trouble swallowing and voice change.   Eyes: Negative for visual disturbance.  Respiratory: Negative for cough, shortness of breath and wheezing.   Cardiovascular: Negative for leg swelling.  Gastrointestinal: Negative for abdominal pain and constipation.       GERD  Genitourinary: Negative for difficulty urinating, dysuria and urgency.       Average nocturnal urination 2x/night.   Musculoskeletal: Positive for arthralgias and gait problem.       R hip pain   Skin: Positive for rash. Negative for color change.  Neurological: Negative for speech difficulty, weakness and light-headedness.  Psychiatric/Behavioral: Negative for behavioral problems and sleep disturbance. The patient is  not nervous/anxious.     Immunization History  Administered Date(s) Administered  . Fluad Quad(high Dose 65+) 10/05/2019  . Influenza Split 12/10/2011, 09/29/2012  . Influenza, High Dose Seasonal PF 09/21/2014, 08/27/2016, 08/27/2017, 09/23/2018  . Influenza,inj,Quad PF,6+ Mos 10/08/2013, 09/12/2015  . PFIZER SARS-COV-2 Vaccination 02/28/2020, 03/29/2020  . Pneumococcal Conjugate-13 08/22/2015  . Pneumococcal Polysaccharide-23 11/23/2008  . Td 03/23/2004  . Tdap 07/12/2015   Pertinent  Health Maintenance Due  Topic Date Due  . DEXA SCAN  Never done  . INFLUENZA VACCINE  07/30/2020  . PNA vac Low Risk Adult  Completed   Fall Risk  09/23/2018 09/19/2017 09/04/2016 08/20/2016 12/20/2014  Falls in the past year? No No No No No  Comment - - Emmi Telephone Survey: data to providers prior to load - -  Functional Status Survey:    Vitals:   06/19/20 1022  BP: (!) 144/80  Pulse: 70  Resp: 18  Temp: (!) 97.3 F (36.3 C)  SpO2: 96%  Weight: 124 lb 12.8 oz (56.6 kg)  Height: 5\' 4"  (1.626 m)   Body mass index is 21.42 kg/m. Physical Exam Vitals and nursing note reviewed.  Constitutional:      Appearance: Normal appearance.  HENT:     Head: Normocephalic and atraumatic.     Nose: Nose normal.     Mouth/Throat:     Mouth: Mucous membranes are moist.  Eyes:     Extraocular Movements: Extraocular movements intact.     Conjunctiva/sclera: Conjunctivae normal.     Pupils: Pupils are equal, round, and reactive to light.  Cardiovascular:     Rate and Rhythm: Normal rate and regular rhythm.     Heart sounds: No murmur heard.   Pulmonary:     Effort: Pulmonary effort is normal.     Breath sounds: No wheezing, rhonchi or rales.  Abdominal:     General: Bowel sounds are normal. There is no distension.     Palpations: Abdomen is soft.     Tenderness: There is no abdominal tenderness. There is no rebound.  Musculoskeletal:     Cervical back: Normal range of motion and neck supple.       Right lower leg: No edema.     Left lower leg: No edema.  Skin:    General: Skin is warm and dry.     Findings: Rash present.     Comments: Left shin itching, started from a center spot, then scratched injury about  the patient hand size, slightly warmth, no tenderness, swelling, or drainage.   Neurological:     General: No focal deficit present.     Mental Status: She is alert and oriented to person, place, and time. Mental status is at baseline.     Gait: Gait abnormal.  Psychiatric:        Mood and Affect: Mood normal.        Behavior: Behavior normal.        Thought Content: Thought content normal.        Judgment: Judgment normal.     Labs reviewed: Recent Labs    09/08/19 0843 10/03/19 1930 10/03/19 2359 10/04/19 0511 10/05/19 0511 10/07/19 0442 10/07/19 0442 11/03/19 1411 11/03/19 1411 02/15/20 1448 02/23/20 1540 05/11/20 0000  NA 137   < >  --  139   < > 139   < > 137  --  136 139 141  K 3.7   < >  --  3.6   < > 3.2*   < > 3.9   < > 5.3* 4.7 3.9  CL 101   < >  --  105   < > 99   < > 97   < > 95* 98 103  CO2 28   < >  --  29   < > 30   < > 35*   < > 29 26 33*  GLUCOSE 132*   < >  --  116*   < > 97   < > 94  --  86 80  --   BUN 17   < >  --  10   < > 21   < > 22  --  19 15 10   CREATININE 0.37*   < >  --  0.35*   < >  0.44   < > 0.61   < > 0.58 0.64 0.5  CALCIUM 8.8*   < >  --  8.6*   < > 8.6*   < > 9.5   < > 9.9 9.7 9.3  MG 2.2  --  2.0 1.9  --  2.0  --   --   --   --   --   --   PHOS 4.1  --   --   --   --  3.7  --   --   --   --   --   --    < > = values in this interval not displayed.   Recent Labs    09/08/19 0843 09/08/19 0843 10/03/19 1930 10/07/19 0442 05/11/20 0000  AST 36  --  16  --  10*  ALT 81*  --  21  --  10  ALKPHOS 247*  --  127*  --  72  BILITOT 0.9  --  0.6  --   --   PROT 5.3*  --  6.1*  --   --   ALBUMIN 2.7*   < > 3.6 2.8* 3.7   < > = values in this interval not displayed.   Recent Labs    10/03/19 1930 10/03/19 1930  10/04/19 0511 11/03/19 1411 05/11/20 0000  WBC 7.3   < > 6.0 6.3 5.7  NEUTROABS 5.3  --   --  4.7 3,523  HGB 11.2*   < > 9.7* 11.3* 12.2  HCT 34.9*   < > 30.2* 33.8* 36  MCV 97.8  --  96.8 93.4  --   PLT 297   < > 268 249.0 239   < > = values in this interval not displayed.   Lab Results  Component Value Date   TSH 1.90 05/11/2020   Lab Results  Component Value Date   HGBA1C 5.5 07/12/2015   Lab Results  Component Value Date   CHOL 227 (H) 06/24/2013   HDL 89.10 06/24/2013   LDLDIRECT 114.7 06/24/2013   TRIG 80.0 06/24/2013   CHOLHDL 3 06/24/2013    Significant Diagnostic Results in last 30 days:  No results found.  Assessment/Plan: Contact dermatitis 0.5% Triamcinolone cream bit to affected area x 10 days or until healed. Avoid scratching, monitor for s/s of infection/cellulitis.   Essential hypertension Blood pressure is controlled, continue Metoprolol, Amlodipine.   Venous (peripheral) insufficiency No apparent edema BLE, continue Furosemide 40mg  qd  ESOPHAGEAL REFLUX Stable, continue Omeprazole.     Family/ staff Communication: plan of care reviewed with the patient and charge nurse.   Labs/tests ordered:  none  Time spend 40 minutes.

## 2020-06-19 NOTE — Assessment & Plan Note (Signed)
0.5% Triamcinolone cream bit to affected area x 10 days or until healed. Avoid scratching, monitor for s/s of infection/cellulitis.

## 2020-06-20 ENCOUNTER — Encounter: Payer: Self-pay | Admitting: Nurse Practitioner

## 2020-06-27 ENCOUNTER — Encounter: Payer: Self-pay | Admitting: Nurse Practitioner

## 2020-06-27 ENCOUNTER — Non-Acute Institutional Stay: Payer: MEDICARE | Admitting: Nurse Practitioner

## 2020-06-27 DIAGNOSIS — I872 Venous insufficiency (chronic) (peripheral): Secondary | ICD-10-CM

## 2020-06-27 DIAGNOSIS — M25562 Pain in left knee: Secondary | ICD-10-CM

## 2020-06-27 DIAGNOSIS — K219 Gastro-esophageal reflux disease without esophagitis: Secondary | ICD-10-CM

## 2020-06-27 DIAGNOSIS — I10 Essential (primary) hypertension: Secondary | ICD-10-CM

## 2020-06-27 NOTE — Assessment & Plan Note (Addendum)
left knee pain when walking x 2-3 days, no redness, swelling, or deformity of the left knee. Hx of R hip surgery about a year ago, didn't tolerate a lift in the left shoe provided by therapy. Takes Celebrex 200mg  qd.  Adding Tylenol 650mg  tid x 10 days, X-ray left knee ap, lateral, oblique views to r/o fxs, activity as tolerated .

## 2020-06-27 NOTE — Assessment & Plan Note (Signed)
Chronic minimal edema BLE, continue Furosemide.

## 2020-06-27 NOTE — Assessment & Plan Note (Signed)
Blood pressure is controlled, continue Amlodipine, Metoprolol.   

## 2020-06-27 NOTE — Assessment & Plan Note (Signed)
Stable, continue Omeprazole.  

## 2020-06-27 NOTE — Progress Notes (Signed)
Location:   [place] Room Number: [place]:  ALF [phone]) Provider: Marlana Latus NP  [name], NP  Patient Care Team: [name], NP as PCP - General (Family Medicine) [name], MD as Consulting Physician (Orthopedic Surgery)  Extended Emergency Contact Information Primary Emergency Contact: cook,carolyn Mobile Phone: [phone] Relation: Friend Preferred language: English  Code Status: DNR Goals of care: Advanced Directive information Advanced Directives 05/10/2020  Does Patient Have a Medical Advance Directive? Yes  Type of Advance Directive Out of facility DNR (pink MOST or yellow form);[place];Living will  Does patient want to make changes to medical advance directive? No - Patient declined  Copy of [place] in Chart? Yes - validated most recent copy scanned in chart (See row information)  Would patient like information on creating a medical advance directive? -  Pre-existing out of facility DNR order (yellow form or pink MOST form) Pink MOST form placed in chart (order not valid for inpatient use)     Chief Complaint  Patient presents with  . Acute Visit    left knee pain    HPI:  Pt is a 86 y.o. female seen today for an acute visit for left knee pain when walking x 2-3 days, no redness, swelling, or deformity of the left knee. Hx of R hip surgery about a year ago, didn't tolerate a lift in the left shoe provided by therapy. Takes Celebrex 200mg  qd.   HTN, blood pressure is controlled, on amlodipine 5mg  qd, Metoprolol 37.5mg  qd.              PVD, peripheral edema, on Furosemide 40mg  qd.              GERD, stable, on Omeprazole.        Past Medical History:  Diagnosis Date  . Aortic heart murmur    AS/AI on 2D ECHO ; SBE prophylaxis Rxed  . Chest pain 2007 ; 2011   negative cardiac assessment  . Hyperlipidemia   . Hypertension [  . Interstitial cystitis    Resolved    . Ocular migraine 04/10/2012  . Osteoporosis    T score - 3.0 @ femoral neck; Fosamax affected swallowing  . Postmenopausal    No PMH or HRT   Past Surgical History:  Procedure Laterality Date  . ANTERIOR APPROACH HEMI HIP ARTHROPLASTY Right 09/03/2019   Procedure: ANTERIOR APPROACH HEMI HIP ARTHROPLASTY BIPOLAR;  Surgeon: [name], MD;  Location: [facility];  Service: Orthopedics;  Laterality: Right;  . COLONOSCOPY  2011   Dr Sharlett Iles    Allergies  Allergen Reactions  . Amitid [Amitriptyline Hcl]     Patient experienced a dizzy, spinning feeling   . Metronidazole     REACTION: throat swells  . Penicillins     REACTION: rash & fever  . Alendronate Sodium     REACTION: unspecified  . Clarithromycin     GI intolerance  . Flonase [Fluticasone Propionate]     Slight Headache   . Promethazine Hcl     REACTION: unspecified  . Robitussin (Alcohol Free) [Guaifenesin] Other (See Comments)    unknown hyperactive  . Spironolactone     09/21/13 weakness in legs  . Singulair [Montelukast Sodium]     "spacey"; ineffective    Allergies as of 06/27/2020      Reactions   Amitid [amitriptyline Hcl]    Patient experienced a dizzy, spinning feeling    Metronidazole  REACTION: throat swells   Penicillins    REACTION: rash & fever   Alendronate Sodium    REACTION: unspecified   Clarithromycin    GI intolerance   Flonase [fluticasone Propionate]    Slight Headache    Promethazine Hcl    REACTION: unspecified   Robitussin (alcohol Free) [guaifenesin] Other (See Comments)   unknown hyperactive   Spironolactone    09/21/13 weakness in legs   Singulair [montelukast Sodium]    "spacey"; ineffective      Medication List       Accurate as of June 27, 2020 11:59 PM. If you have any questions, ask your nurse or doctor.        acetaminophen 500 MG tablet Commonly known as: TYLENOL Take 1,000 mg by mouth every 6 (six) hours as needed for moderate pain.   amLODipine 5 MG  tablet Commonly known as: NORVASC Take 1 tablet (5 mg total) by mouth every evening.   aspirin EC 81 MG tablet Take 1 tablet (81 mg total) by mouth daily.   celecoxib 200 MG capsule Commonly known as: CELEBREX Take 200 mg by mouth daily.   docusate sodium 100 MG capsule Commonly known as: COLACE Take 100 mg by mouth 2 (two) times daily as needed.   fexofenadine 180 MG tablet Commonly known as: ALLEGRA Take 180 mg by mouth as needed for allergies or rhinitis.   furosemide 40 MG tablet Commonly known as: LASIX Take 1 tablet (40 mg total) by mouth daily.   lactose free nutrition Liqd Take 237 mLs by mouth daily.   metoprolol succinate 25 MG 24 hr tablet Commonly known as: TOPROL-XL TAKE 1.5 TABLETS (37.5 MG TOTAL) BY MOUTH DAILY.   multivitamin tablet Take 1 tablet by mouth daily. chewable   omeprazole 20 MG capsule Commonly known as: PRILOSEC Take 20 mg by mouth daily.   triamcinolone cream 0.5 % Commonly known as: KENALOG Apply 1 application topically 2 (two) times daily.       Review of Systems  Constitutional: Positive for activity change. Negative for appetite change and fever.  HENT: Positive for hearing loss. Negative for congestion, trouble swallowing and voice change.   Eyes: Negative for visual disturbance.  Respiratory: Negative for cough, shortness of breath and wheezing.   Cardiovascular: Positive for leg swelling.  Gastrointestinal: Negative for abdominal pain and constipation.       GERD  Genitourinary: Negative for difficulty urinating, dysuria and urgency.       Average nocturnal urination 2x/night.   Musculoskeletal: Positive for arthralgias and gait problem.       R hip pain is chronic, surgery about a year ago. New left knee pain with walking.   Skin: Positive for rash. Negative for color change.  Neurological: Negative for dizziness, speech difficulty and weakness.  Psychiatric/Behavioral: Negative for behavioral problems and sleep  disturbance. The patient is not nervous/anxious.     Immunization History  Administered Date(s) Administered  . Fluad Quad(high Dose 65+) 10/05/2019  . Influenza Split 12/10/2011, 09/29/2012  . Influenza, High Dose Seasonal PF 09/21/2014, 08/27/2016, 08/27/2017, 09/23/2018  . Influenza,inj,Quad PF,6+ Mos 10/08/2013, 09/12/2015  . PFIZER SARS-COV-2 Vaccination 02/28/2020, 03/29/2020  . Pneumococcal Conjugate-13 08/22/2015  . Pneumococcal Polysaccharide-23 11/23/2008  . Td 03/23/2004  . Tdap 07/12/2015   Pertinent  Health Maintenance Due  Topic Date Due  . DEXA SCAN  Never done  . INFLUENZA VACCINE  07/30/2020  . PNA vac Low Risk Adult  Completed   Fall Risk  09/23/2018  09/19/2017 09/04/2016 08/20/2016 12/20/2014  Falls in the past year? No No No No No  Comment - - Emmi Telephone Survey: data to providers prior to load - -   Functional Status Survey:    Vitals:   06/27/20 1451  BP: 140/72  Pulse: 68  Resp: 18  Temp: 98.7 F (37.1 C)  SpO2: 97%  Weight: 124 lb 12.8 oz (56.6 kg)  Height: 5\' 4"  (1.626 m)   Body mass index is 21.42 kg/m. Physical Exam Vitals and nursing note reviewed.  Constitutional:      Appearance: Normal appearance.  HENT:     Head: Normocephalic and atraumatic.     Nose: Nose normal.     Mouth/Throat:     Mouth: Mucous membranes are moist.  Eyes:     Extraocular Movements: Extraocular movements intact.     Conjunctiva/sclera: Conjunctivae normal.     Pupils: Pupils are equal, round, and reactive to light.  Cardiovascular:     Rate and Rhythm: Normal rate and regular rhythm.     Heart sounds: No murmur heard.   Pulmonary:     Effort: Pulmonary effort is normal.     Breath sounds: No rales.  Abdominal:     General: Bowel sounds are normal.     Palpations: Abdomen is soft.     Tenderness: There is no abdominal tenderness.  Musculoskeletal:        General: No swelling, tenderness, deformity or signs of injury.     Cervical back: Normal range  of motion and neck supple.     Right lower leg: Edema present.     Left lower leg: Edema present.     Comments: Trace edema BLE. Left knee pain with walking.   Skin:    General: Skin is warm and dry.     Findings: Rash present.     Comments: Left shin itching is improved.   Neurological:     General: No focal deficit present.     Mental Status: She is alert and oriented to person, place, and time. Mental status is at baseline.     Motor: No weakness.     Coordination: Coordination normal.     Gait: Gait abnormal.  Psychiatric:        Mood and Affect: Mood normal.        Behavior: Behavior normal.        Thought Content: Thought content normal.        Judgment: Judgment normal.     Labs reviewed: Recent Labs    09/08/19 0843 10/03/19 1930 10/03/19 2359 10/04/19 0511 10/05/19 0511 10/07/19 0442 10/07/19 0442 11/03/19 1411 11/03/19 1411 02/15/20 1448 02/23/20 1540 05/11/20 0000  NA 137   < >  --  139   < > 139   < > 137  --  136 139 141  K 3.7   < >  --  3.6   < > 3.2*   < > 3.9   < > 5.3* 4.7 3.9  CL 101   < >  --  105   < > 99   < > 97   < > 95* 98 103  CO2 28   < >  --  29   < > 30   < > 35*   < > 29 26 33*  GLUCOSE 132*   < >  --  116*   < > 97   < > 94  --  86 80  --  BUN 17   < >  --  10   < > 21   < > 22  --  19 15 10   CREATININE 0.37*   < >  --  0.35*   < > 0.44   < > 0.61   < > 0.58 0.64 0.5  CALCIUM 8.8*   < >  --  8.6*   < > 8.6*   < > 9.5   < > 9.9 9.7 9.3  MG 2.2  --  2.0 1.9  --  2.0  --   --   --   --   --   --   PHOS 4.1  --   --   --   --  3.7  --   --   --   --   --   --    < > = values in this interval not displayed.   Recent Labs    09/08/19 0843 09/08/19 0843 10/03/19 1930 10/07/19 0442 05/11/20 0000  AST 36  --  16  --  10*  ALT 81*  --  21  --  10  ALKPHOS 247*  --  127*  --  72  BILITOT 0.9  --  0.6  --   --   PROT 5.3*  --  6.1*  --   --   ALBUMIN 2.7*   < > 3.6 2.8* 3.7   < > = values in this interval not displayed.   Recent Labs     10/03/19 1930 10/03/19 1930 10/04/19 0511 11/03/19 1411 05/11/20 0000  WBC 7.3   < > 6.0 6.3 5.7  NEUTROABS 5.3  --   --  4.7 3,523  HGB 11.2*   < > 9.7* 11.3* 12.2  HCT 34.9*   < > 30.2* 33.8* 36  MCV 97.8  --  96.8 93.4  --   PLT 297   < > 268 249.0 239   < > = values in this interval not displayed.   Lab Results  Component Value Date   TSH 1.90 05/11/2020   Lab Results  Component Value Date   HGBA1C 5.5 07/12/2015   Lab Results  Component Value Date   CHOL 227 (H) 06/24/2013   HDL 89.10 06/24/2013   LDLDIRECT 114.7 06/24/2013   TRIG 80.0 06/24/2013   CHOLHDL 3 06/24/2013    Significant Diagnostic Results in last 30 days:  No results found.  Assessment/Plan: Left knee pain  left knee pain when walking x 2-3 days, no redness, swelling, or deformity of the left knee. Hx of R hip surgery about a year ago, didn't tolerate a lift in the left shoe provided by therapy. Takes Celebrex 200mg  qd.  Adding Tylenol 650mg  tid x 10 days, X-ray left knee ap, lateral, oblique views to r/o fxs, activity as tolerated .  Essential hypertension Blood pressure is controlled, continue Amlodipine, Metoprolol  Venous (peripheral) insufficiency Chronic minimal edema BLE, continue Furosemide.   ESOPHAGEAL REFLUX Stable, continue Omeprazole.     Family/ staff Communication: plan of care reviewed with the patient and charge nurse.   Labs/tests ordered: Xray left knee ap/lateral views

## 2020-06-28 ENCOUNTER — Encounter: Payer: Self-pay | Admitting: Nurse Practitioner

## 2020-06-29 DIAGNOSIS — I10 Essential (primary) hypertension: Secondary | ICD-10-CM | POA: Diagnosis not present

## 2020-06-29 DIAGNOSIS — M6281 Muscle weakness (generalized): Secondary | ICD-10-CM | POA: Diagnosis not present

## 2020-06-29 DIAGNOSIS — R2681 Unsteadiness on feet: Secondary | ICD-10-CM | POA: Diagnosis not present

## 2020-06-29 DIAGNOSIS — M79604 Pain in right leg: Secondary | ICD-10-CM | POA: Diagnosis not present

## 2020-07-03 DIAGNOSIS — M6281 Muscle weakness (generalized): Secondary | ICD-10-CM | POA: Diagnosis not present

## 2020-07-03 DIAGNOSIS — R2681 Unsteadiness on feet: Secondary | ICD-10-CM | POA: Diagnosis not present

## 2020-07-03 DIAGNOSIS — I10 Essential (primary) hypertension: Secondary | ICD-10-CM | POA: Diagnosis not present

## 2020-07-03 DIAGNOSIS — M79604 Pain in right leg: Secondary | ICD-10-CM | POA: Diagnosis not present

## 2020-07-06 DIAGNOSIS — M79604 Pain in right leg: Secondary | ICD-10-CM | POA: Diagnosis not present

## 2020-07-06 DIAGNOSIS — M6281 Muscle weakness (generalized): Secondary | ICD-10-CM | POA: Diagnosis not present

## 2020-07-06 DIAGNOSIS — I10 Essential (primary) hypertension: Secondary | ICD-10-CM | POA: Diagnosis not present

## 2020-07-06 DIAGNOSIS — R2681 Unsteadiness on feet: Secondary | ICD-10-CM | POA: Diagnosis not present

## 2020-07-07 DIAGNOSIS — M79604 Pain in right leg: Secondary | ICD-10-CM | POA: Diagnosis not present

## 2020-07-07 DIAGNOSIS — I10 Essential (primary) hypertension: Secondary | ICD-10-CM | POA: Diagnosis not present

## 2020-07-07 DIAGNOSIS — M6281 Muscle weakness (generalized): Secondary | ICD-10-CM | POA: Diagnosis not present

## 2020-07-07 DIAGNOSIS — R2681 Unsteadiness on feet: Secondary | ICD-10-CM | POA: Diagnosis not present

## 2020-07-10 DIAGNOSIS — M79604 Pain in right leg: Secondary | ICD-10-CM | POA: Diagnosis not present

## 2020-07-10 DIAGNOSIS — M6281 Muscle weakness (generalized): Secondary | ICD-10-CM | POA: Diagnosis not present

## 2020-07-10 DIAGNOSIS — R2681 Unsteadiness on feet: Secondary | ICD-10-CM | POA: Diagnosis not present

## 2020-07-10 DIAGNOSIS — I10 Essential (primary) hypertension: Secondary | ICD-10-CM | POA: Diagnosis not present

## 2020-07-11 DIAGNOSIS — I10 Essential (primary) hypertension: Secondary | ICD-10-CM | POA: Diagnosis not present

## 2020-07-11 DIAGNOSIS — M6281 Muscle weakness (generalized): Secondary | ICD-10-CM | POA: Diagnosis not present

## 2020-07-11 DIAGNOSIS — M79604 Pain in right leg: Secondary | ICD-10-CM | POA: Diagnosis not present

## 2020-07-11 DIAGNOSIS — R2681 Unsteadiness on feet: Secondary | ICD-10-CM | POA: Diagnosis not present

## 2020-07-12 ENCOUNTER — Other Ambulatory Visit: Payer: Self-pay | Admitting: Adult Health

## 2020-07-13 DIAGNOSIS — M6281 Muscle weakness (generalized): Secondary | ICD-10-CM | POA: Diagnosis not present

## 2020-07-13 DIAGNOSIS — M79604 Pain in right leg: Secondary | ICD-10-CM | POA: Diagnosis not present

## 2020-07-13 DIAGNOSIS — R2681 Unsteadiness on feet: Secondary | ICD-10-CM | POA: Diagnosis not present

## 2020-07-13 DIAGNOSIS — I10 Essential (primary) hypertension: Secondary | ICD-10-CM | POA: Diagnosis not present

## 2020-07-14 DIAGNOSIS — M6281 Muscle weakness (generalized): Secondary | ICD-10-CM | POA: Diagnosis not present

## 2020-07-14 DIAGNOSIS — I10 Essential (primary) hypertension: Secondary | ICD-10-CM | POA: Diagnosis not present

## 2020-07-14 DIAGNOSIS — M79604 Pain in right leg: Secondary | ICD-10-CM | POA: Diagnosis not present

## 2020-07-14 DIAGNOSIS — R2681 Unsteadiness on feet: Secondary | ICD-10-CM | POA: Diagnosis not present

## 2020-07-18 DIAGNOSIS — M1712 Unilateral primary osteoarthritis, left knee: Secondary | ICD-10-CM | POA: Diagnosis not present

## 2020-08-08 DIAGNOSIS — M7061 Trochanteric bursitis, right hip: Secondary | ICD-10-CM | POA: Diagnosis not present

## 2020-08-08 DIAGNOSIS — M170 Bilateral primary osteoarthritis of knee: Secondary | ICD-10-CM | POA: Diagnosis not present

## 2020-08-08 DIAGNOSIS — M25551 Pain in right hip: Secondary | ICD-10-CM | POA: Diagnosis not present

## 2020-08-15 NOTE — Progress Notes (Deleted)
Cardiology Clinic Note   Patient Name: Alicia Boyd Date of Encounter: 08/15/2020  Primary Care Provider:  [name], NP Primary Cardiologist:  No primary care provider on file.  Patient Profile    ***  Past Medical History    Past Medical History:  Diagnosis Date  . Aortic heart murmur    AS/AI on 2D ECHO ; SBE prophylaxis Rxed  . Chest pain 2007 ; 2011   negative cardiac assessment  . Hyperlipidemia   . Hypertension [  . Interstitial cystitis    Resolved  . Ocular migraine 04/10/2012  . Osteoporosis    T score - 3.0 @ femoral neck; Fosamax affected swallowing  . Postmenopausal    No PMH or HRT   Past Surgical History:  Procedure Laterality Date  . ANTERIOR APPROACH HEMI HIP ARTHROPLASTY Right 09/03/2019   Procedure: ANTERIOR APPROACH HEMI HIP ARTHROPLASTY BIPOLAR;  Surgeon: [name], MD;  Location: [facility];  Service: Orthopedics;  Laterality: Right;  . COLONOSCOPY  2011   Dr Sharlett Iles    Allergies  Allergies  Allergen Reactions  . Amitid [Amitriptyline Hcl]     Patient experienced a dizzy, spinning feeling   . Metronidazole     REACTION: throat swells  . Penicillins     REACTION: rash & fever  . Alendronate Sodium     REACTION: unspecified  . Clarithromycin     GI intolerance  . Flonase [Fluticasone Propionate]     Slight Headache   . Promethazine Hcl     REACTION: unspecified  . Robitussin (Alcohol Free) [Guaifenesin] Other (See Comments)    unknown hyperactive  . Spironolactone     09/21/13 weakness in legs  . Singulair [Montelukast Sodium]     "spacey"; ineffective    History of Present Illness    ***  Home Medications    Prior to Admission medications   Medication Sig Start Date End Date Taking? Authorizing Provider  acetaminophen (TYLENOL) 500 MG tablet Take 1,000 mg by mouth every 6 (six) hours as needed for moderate pain.    [provider]  amLODipine (NORVASC) 5 MG tablet TAKE 1 TABLET BY MOUTH EVERY DAY IN THE  EVENING 07/12/20   Nafziger, [name], NP  aspirin EC 81 MG tablet Take 1 tablet (81 mg total) by mouth daily. 11/15/19   Lelon Perla, MD  docusate sodium (COLACE) 100 MG capsule Take 100 mg by mouth 2 (two) times daily as needed.     [provider]  fexofenadine (ALLEGRA) 180 MG tablet Take 180 mg by mouth as needed for allergies or rhinitis.    [provider]  furosemide (LASIX) 40 MG tablet Take 1 tablet (40 mg total) by mouth daily. 03/28/20   Nafziger, [name], NP  lactose free nutrition (BOOST PLUS) LIQD Take 237 mLs by mouth daily.    [provider]  metoprolol succinate (TOPROL-XL) 25 MG 24 hr tablet TAKE 1.5 TABLETS (37.5 MG TOTAL) BY MOUTH DAILY. 05/17/20   Nafziger, [name], NP  Multiple Vitamin (MULTIVITAMIN) tablet Take 1 tablet by mouth daily. chewable    [provider]  omeprazole (PRILOSEC) 20 MG capsule Take 20 mg by mouth daily.    [provider]    Family History    Family History  Problem Relation Age of Onset  . Diabetes Father        Pancreatitic insufficiency post MVA  . Hypertension Mother   . Osteoporosis Mother   . Heart disease Mother  AF  . Thyroid disease Sister        hypothyroidism  . Coronary artery disease Sister        AF  . Stroke Sister 82       also pulmonary disease   She indicated that her mother is deceased. She indicated that the status of her father is unknown. She indicated that one of her four sisters is deceased.  Social History    Social History   Socioeconomic History  . Marital status: Widowed    Spouse name: Not on file  . Number of children: 1  . Years of education: Not on file  . Highest education level: Not on file  Occupational History  . Not on file  Tobacco Use  . Smoking status: Never Smoker  . Smokeless tobacco: Never Used  Substance and Sexual Activity  . Alcohol use: No  . Drug use: No  . Sexual activity: Not Currently    Birth control/protection: None  Other  Topics Concern  . Not on file  Social History Narrative  . Not on file   Social Determinants of Health   Financial Resource Strain:   . Difficulty of Paying Living Expenses:   Food Insecurity:   . Worried About Charity fundraiser in the Last Year:   . Arboriculturist in the Last Year:   Transportation Needs:   . Film/video editor (Medical):   Marland Kitchen Lack of Transportation (Non-Medical):   Physical Activity:   . Days of Exercise per Week:   . Minutes of Exercise per Session:   Stress:   . Feeling of Stress :   Social Connections:   . Frequency of Communication with Friends and Family:   . Frequency of Social Gatherings with Friends and Family:   . Attends Religious Services:   . Active Member of Clubs or Organizations:   . Attends Archivist Meetings:   Marland Kitchen Marital Status:   Intimate Partner Violence:   . Fear of Current or Ex-Partner:   . Emotionally Abused:   Marland Kitchen Physically Abused:   . Sexually Abused:      Review of Systems    General:  No chills, fever, night sweats or weight changes.  Cardiovascular:  No chest pain, dyspnea on exertion, edema, orthopnea, palpitations, paroxysmal nocturnal dyspnea. Dermatological: No rash, lesions/masses Respiratory: No cough, dyspnea Urologic: No hematuria, dysuria Abdominal:   No nausea, vomiting, diarrhea, bright red blood per rectum, melena, or hematemesis Neurologic:  No visual changes, wkns, changes in mental status. All other systems reviewed and are otherwise negative except as noted above.  Physical Exam    VS:  There were no vitals taken for this visit. , BMI There is no height or weight on file to calculate BMI. GEN: Well nourished, well developed, in no acute distress. HEENT: normal. Neck: Supple, no JVD, carotid bruits, or masses. Cardiac: RRR, no murmurs, rubs, or gallops. No clubbing, cyanosis, edema.  Radials/DP/PT 2+ and equal bilaterally.  Respiratory:  Respirations regular and unlabored, clear to  auscultation bilaterally. GI: Soft, nontender, nondistended, BS + x 4. MS: no deformity or atrophy. Skin: warm and dry, no rash. Neuro:  Strength and sensation are intact. Psych: Normal affect.  Accessory Clinical Findings    Recent Labs: 10/03/2019: B Natriuretic Peptide 525.2 10/07/2019: Magnesium 2.0 05/11/2020: ALT 10; BUN 10; Creatinine 0.5; Hemoglobin 12.2; Platelets 239; Potassium 3.9; Sodium 141; TSH 1.90   Recent Lipid Panel    Component Value Date/Time  CHOL 227 (H) 06/24/2013 1033   TRIG 80.0 06/24/2013 1033   HDL 89.10 06/24/2013 1033   CHOLHDL 3 06/24/2013 1033   VLDL 16.0 06/24/2013 1033   LDLDIRECT 114.7 06/24/2013 1033    ECG personally reviewed by me today- *** - No acute changes  Assessment & Plan   1.  ***   Jossie Ng. Kelly Ranieri NP-C    08/15/2020, 8:24 AM [address] [place] [address] Office [phone] Fax [phone]  Notice: This dictation was prepared with Dragon dictation along with smaller phrase technology. Any transcriptional errors that result from this process are unintentional and may not be corrected upon review.

## 2020-08-17 ENCOUNTER — Ambulatory Visit: Payer: MEDICARE | Admitting: General Practice

## 2020-08-26 IMAGING — DX DG CHEST 2V
2 series · 2 of 2 positions shown · non-contrast
Comparison: PA and lateral chest 10/03/2019.  CT chest 10/03/2019.

CLINICAL DATA: Increasing lower extremity edema and shortness of
breath. Cough.

EXAM:
CHEST - 2 VIEW

[chest lat]
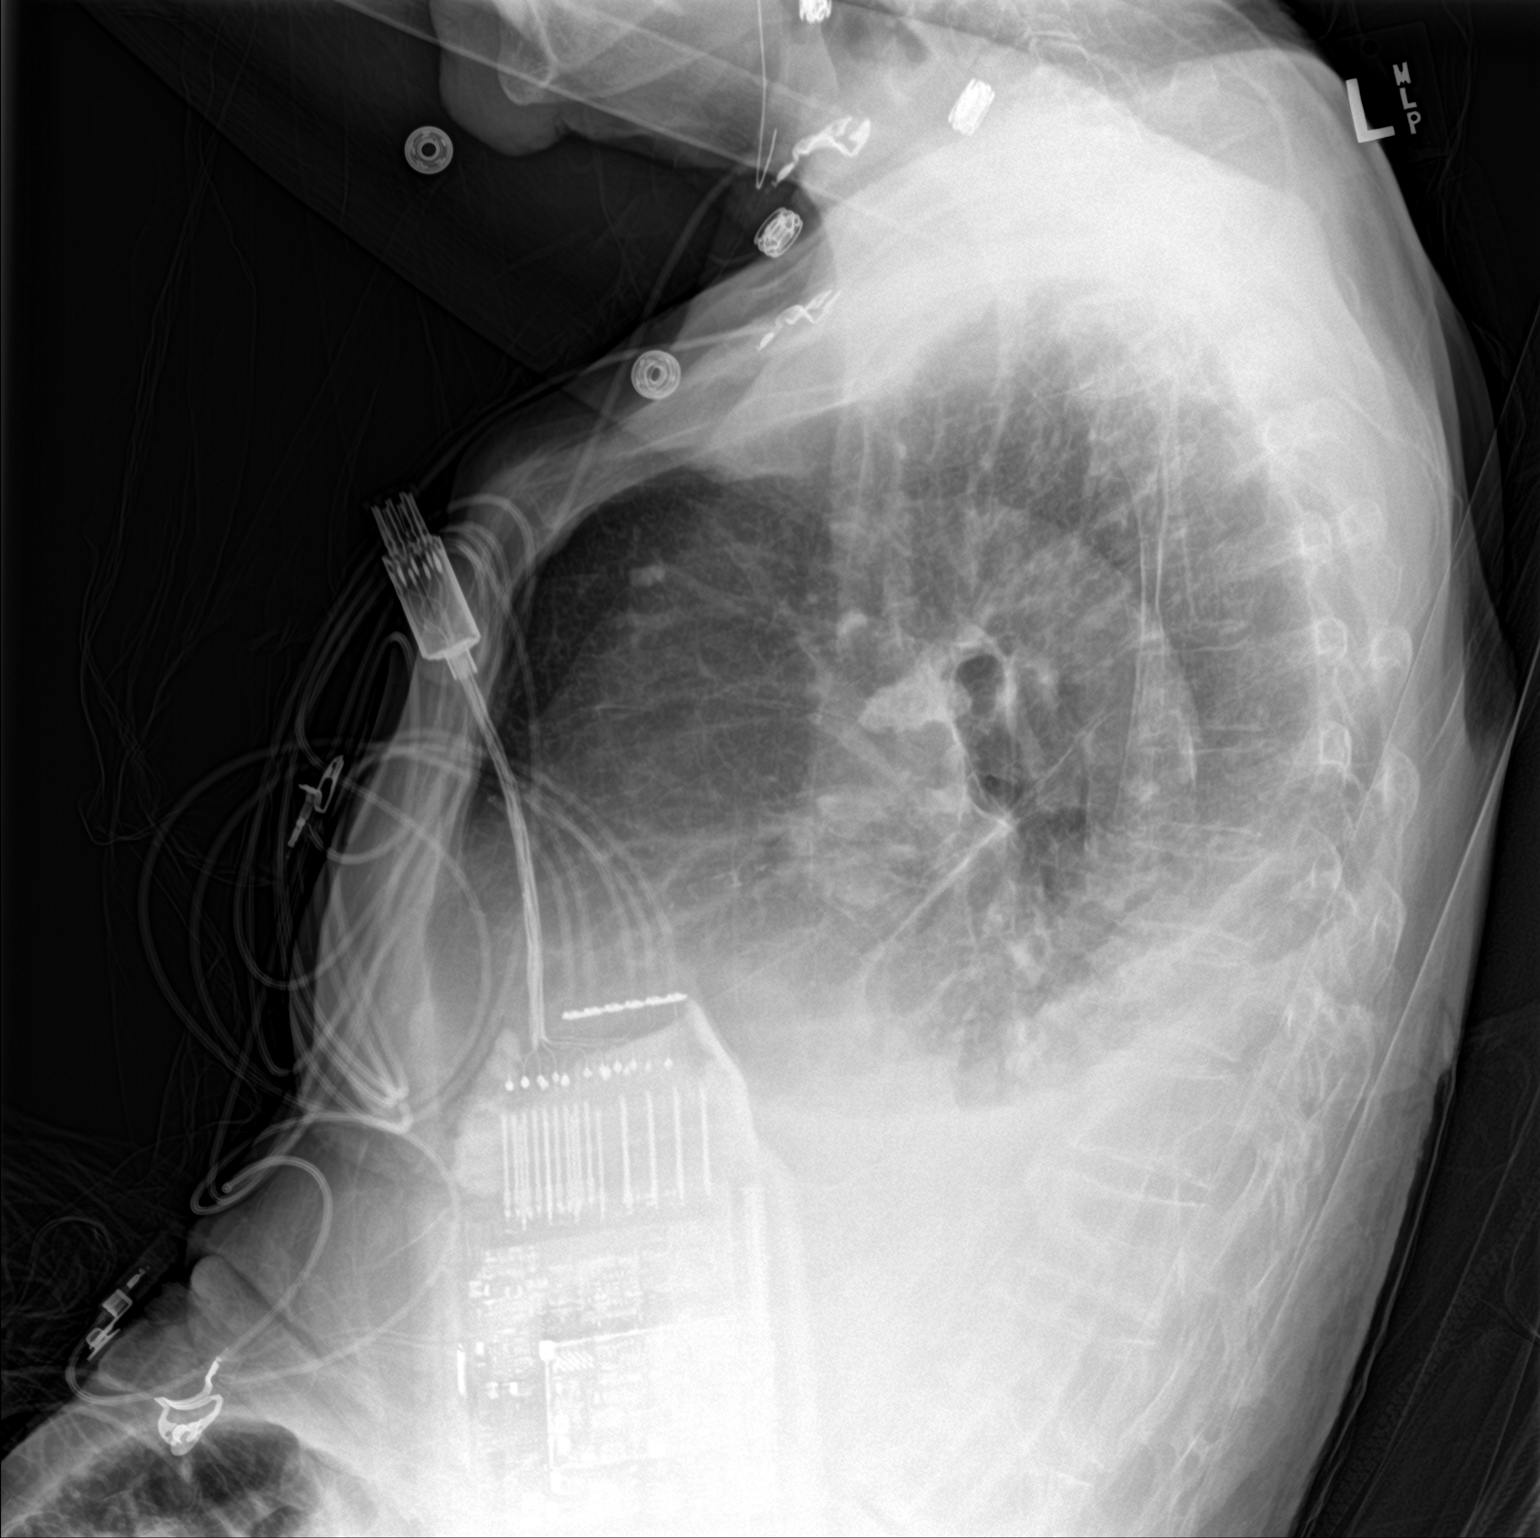

[chest ap]
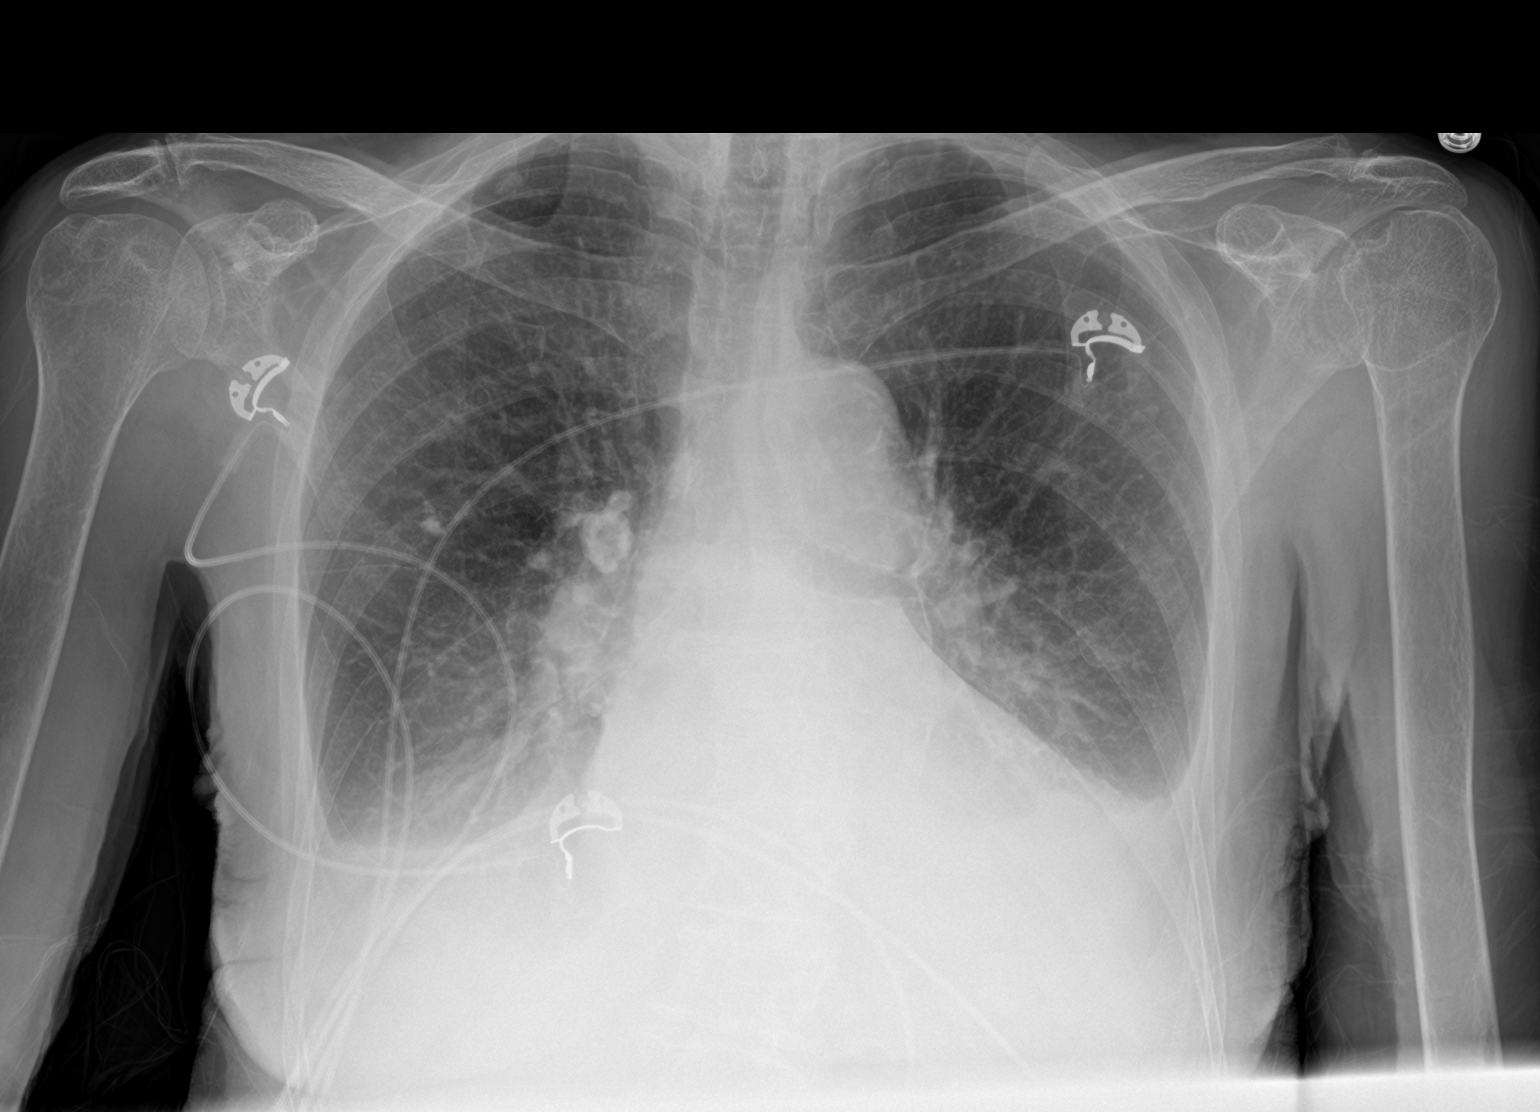

[2 of 2 positions shown; findings below may reference images not displayed]

FINDINGS: Small to moderate bilateral pleural effusions are increased since
the most recent comparison plain films. There is cardiomegaly and
mild interstitial edema. No pneumothorax. Aortic atherosclerosis
noted. No acute or focal bony abnormality.
IMPRESSION: Small to moderate pleural effusions have increased since the most
recent examination.

No notable change in cardiomegaly and mild interstitial edema.

## 2020-08-28 ENCOUNTER — Telehealth: Payer: Self-pay | Admitting: Adult Health

## 2020-08-28 IMAGING — DX DG CHEST 2V
2 series · 2 of 2 positions shown · non-contrast
Comparison: 10/04/2019

CLINICAL DATA: History of heart failure and bilateral pleural
effusions.

EXAM:
CHEST - 2 VIEW

[chest lat]
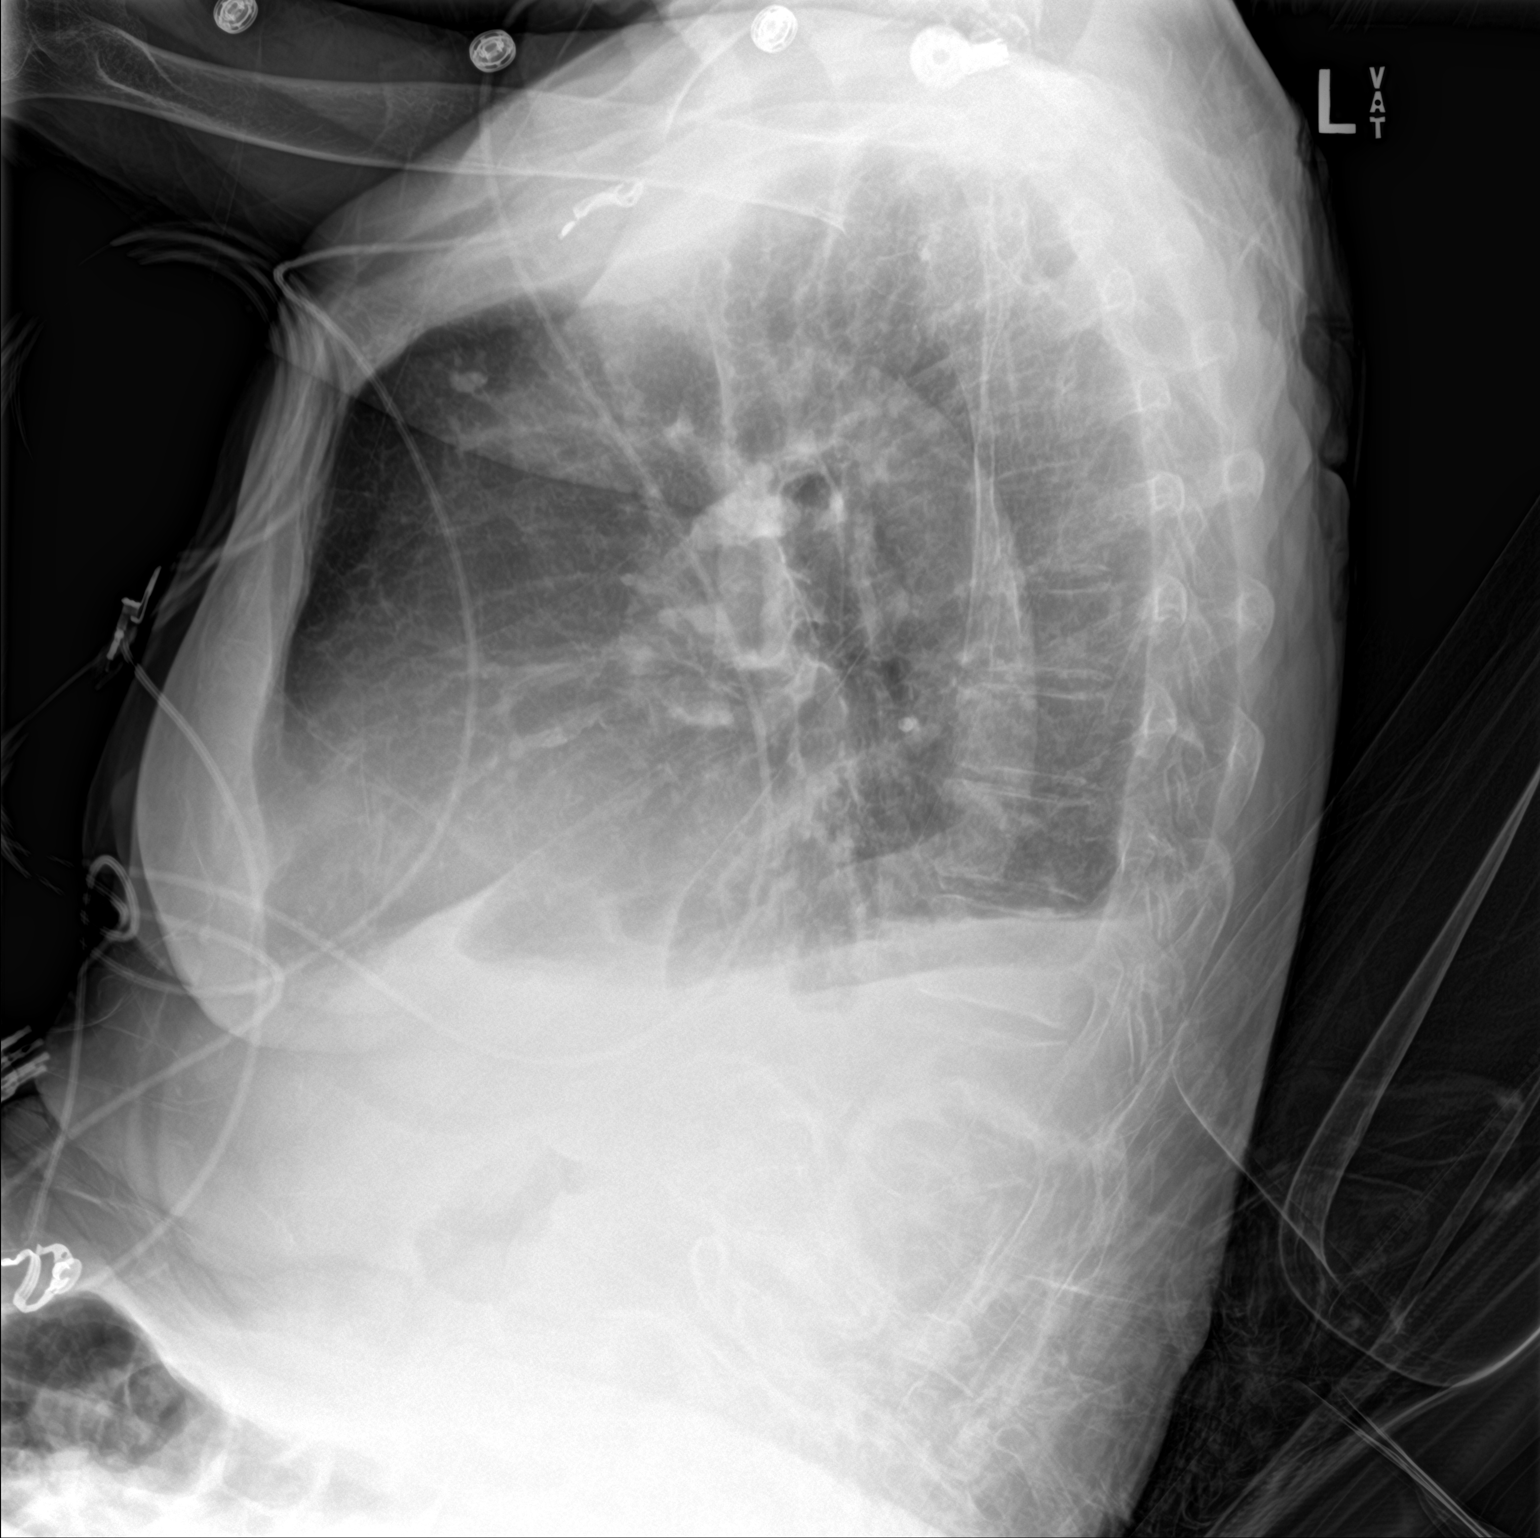

[chest ap]
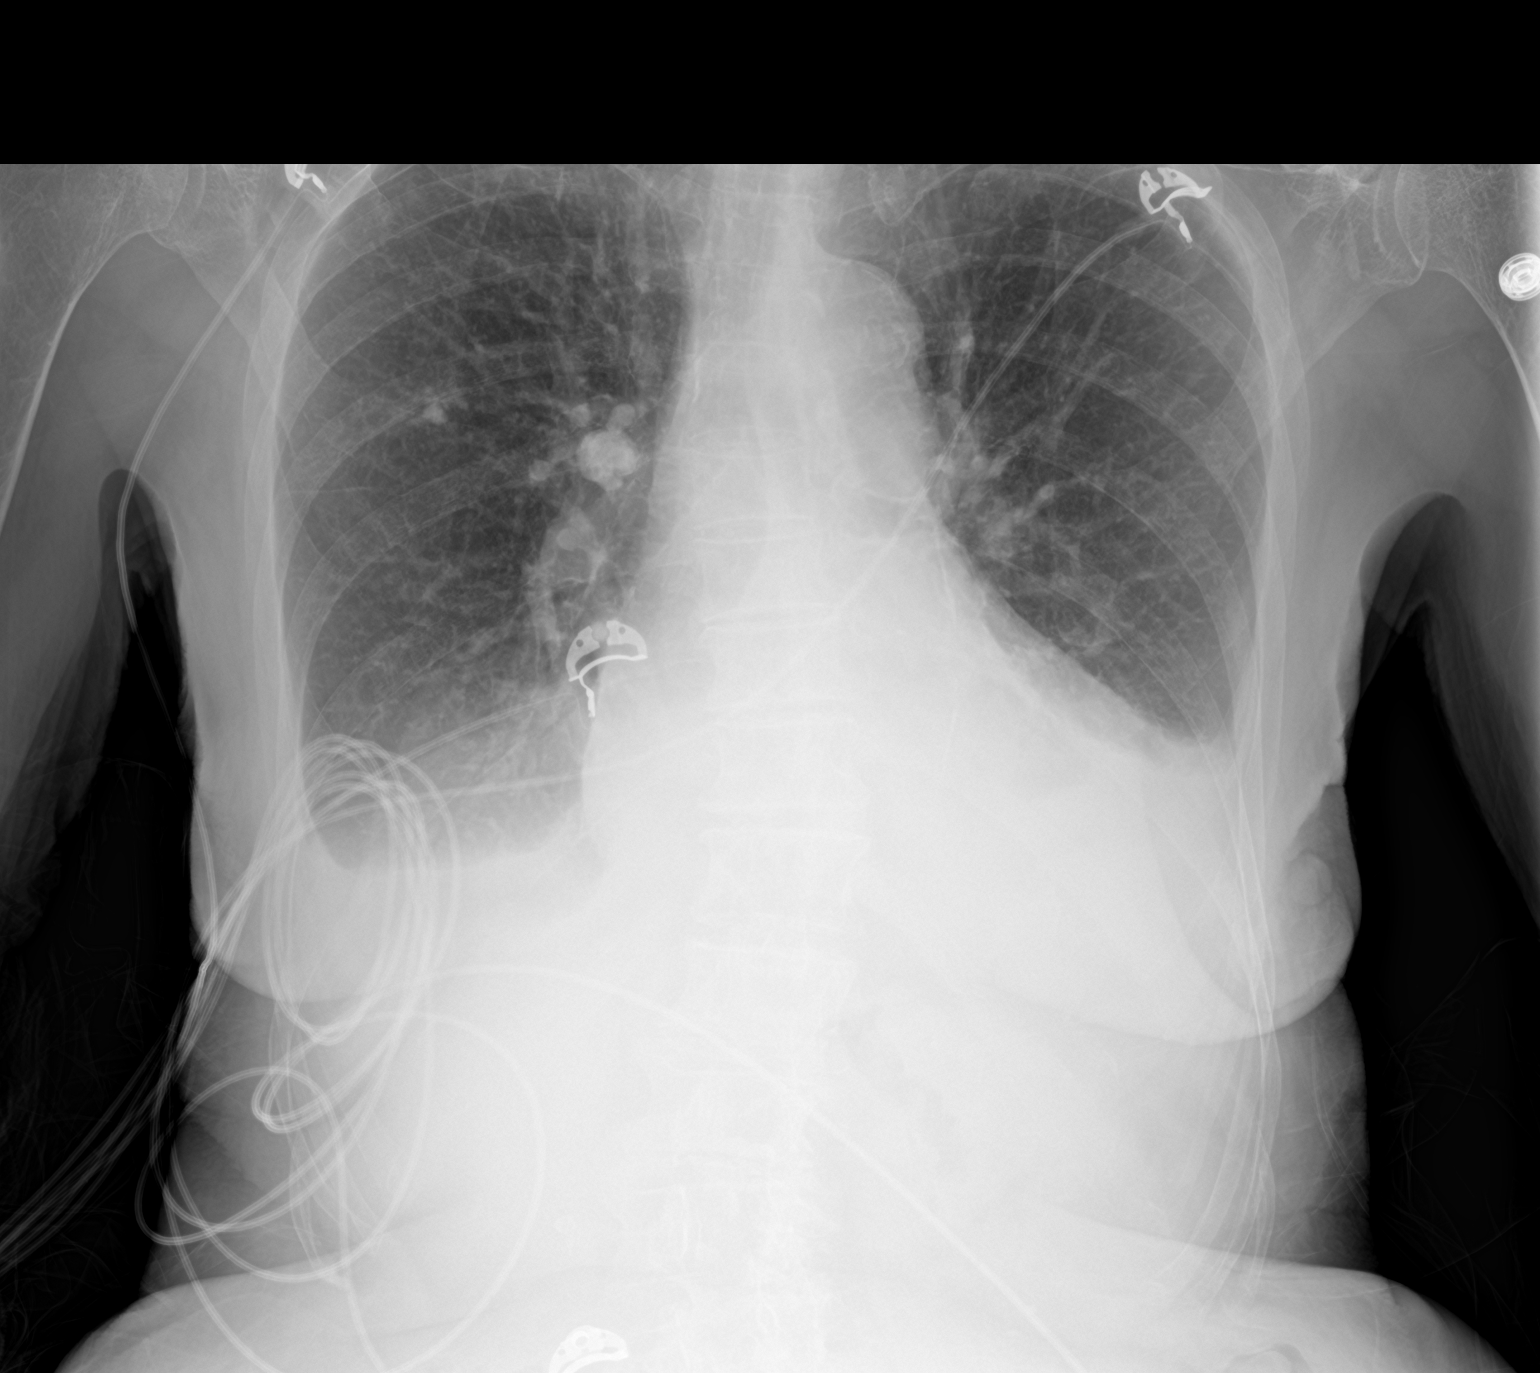

[2 of 2 positions shown; findings below may reference images not displayed]

FINDINGS: Cardiac shadow is mildly enlarged but stable. Aortic calcifications
are again seen. Bilateral pleural effusions are noted left greater
than right similar to that seen on the prior exam. Underlying
atelectatic change is likely present. Mild central vascular
congestion is noted. Calcified granuloma is noted in the right upper
lobe.
IMPRESSION: Stable changes of vascular congestion with bilateral effusions and
likely underlying basilar atelectasis/infiltrate.

## 2020-08-28 NOTE — Progress Notes (Signed)
  Chronic Care Management   Outreach Note  08/28/2020 Name: Alicia Boyd MRN: [id number] DOB: 1930-10-15  Referred by: [name], NP Reason for referral : No chief complaint on file.   An unsuccessful telephone outreach was attempted today. The patient was referred to the pharmacist for assistance with care management and care coordination.   Follow Up Plan:   Carley Perdue [employer] Scheduler

## 2020-08-29 ENCOUNTER — Non-Acute Institutional Stay: Payer: MEDICARE | Admitting: Internal Medicine

## 2020-08-29 ENCOUNTER — Encounter: Payer: Self-pay | Admitting: Internal Medicine

## 2020-08-29 DIAGNOSIS — I10 Essential (primary) hypertension: Secondary | ICD-10-CM

## 2020-08-29 DIAGNOSIS — M25571 Pain in right ankle and joints of right foot: Secondary | ICD-10-CM

## 2020-08-29 DIAGNOSIS — M810 Age-related osteoporosis without current pathological fracture: Secondary | ICD-10-CM | POA: Diagnosis not present

## 2020-08-29 DIAGNOSIS — I509 Heart failure, unspecified: Secondary | ICD-10-CM

## 2020-08-29 DIAGNOSIS — F329 Major depressive disorder, single episode, unspecified: Secondary | ICD-10-CM

## 2020-08-29 DIAGNOSIS — F32A Depression, unspecified: Secondary | ICD-10-CM

## 2020-08-29 DIAGNOSIS — M25562 Pain in left knee: Secondary | ICD-10-CM | POA: Diagnosis not present

## 2020-08-29 DIAGNOSIS — F419 Anxiety disorder, unspecified: Secondary | ICD-10-CM | POA: Diagnosis not present

## 2020-08-29 NOTE — Progress Notes (Signed)
Location:  [facility] Room Number: [phone] Place of Service:  ALF (13)  Provider:   Code Status:  Goals of Care:  Advanced Directives 08/29/2020  Does Patient Have a Medical Advance Directive? Yes  Type of Advance Directive Out of facility DNR (pink MOST or yellow form);Healthcare Power of Attorney  Does patient want to make changes to medical advance directive? No - Patient declined  Copy of [place] in Chart? -  Would patient like information on creating a medical advance directive? -  Pre-existing out of facility DNR order (yellow form or pink MOST form) Pink MOST form placed in chart (order not valid for inpatient use)     Chief Complaint  Patient presents with  . Medical Management of Chronic Issues  . Health Maintenance    Dexa scan    HPI: Patient is a 86 y.o. female seen today for medical management of chronic diseases.    Has history of diastolic CHF with echo showing LVH and severe left atrial enlargement possible amyloidosis, hypertension, history of palpitations,CAD, history of right hip fracture in 9/20, osteoporosis and anemia, hypertensive retinopathy  C/o Right Ankle Pain She has been having pain in right ankle for past few days. And has noticed swelling. No History of Injury. Had pain in Left knee also which is now better since it was injected by Ortho  Otherwise doing well with her mood. Eating well. No SOB or Chest Pain or cough  Past Medical History:  Diagnosis Date  . Aortic heart murmur    AS/AI on 2D ECHO ; SBE prophylaxis Rxed  . Chest pain 2007 ; 2011   negative cardiac assessment  . Hyperlipidemia   . Hypertension [  . Interstitial cystitis    Resolved  . Ocular migraine 04/10/2012  . Osteoporosis    T score - 3.0 @ femoral neck; Fosamax affected swallowing  . Postmenopausal    No PMH or HRT    Past Surgical History:  Procedure Laterality Date  . ANTERIOR APPROACH HEMI HIP ARTHROPLASTY Right  09/03/2019   Procedure: ANTERIOR APPROACH HEMI HIP ARTHROPLASTY BIPOLAR;  Surgeon: [name], MD;  Location: [facility];  Service: Orthopedics;  Laterality: Right;  . COLONOSCOPY  2011   Dr Sharlett Iles    Allergies  Allergen Reactions  . Amitid [Amitriptyline Hcl]     Patient experienced a dizzy, spinning feeling   . Metronidazole     REACTION: throat swells  . Penicillins     REACTION: rash & fever  . Alendronate Sodium     REACTION: unspecified  . Clarithromycin     GI intolerance  . Flonase [Fluticasone Propionate]     Slight Headache   . Promethazine Hcl     REACTION: unspecified  . Robitussin (Alcohol Free) [Guaifenesin] Other (See Comments)    unknown hyperactive  . Spironolactone     09/21/13 weakness in legs  . Singulair [Montelukast Sodium]     "spacey"; ineffective    Outpatient Encounter Medications as of 08/29/2020  Medication Sig  . acetaminophen (TYLENOL) 325 MG tablet Take 650 mg by mouth every 6 (six) hours as needed.  Marland Kitchen acetaminophen (TYLENOL) 500 MG tablet Take 1,000 mg by mouth every 6 (six) hours as needed for moderate pain.  Marland Kitchen amLODipine (NORVASC) 5 MG tablet TAKE 1 TABLET BY MOUTH EVERY DAY IN THE EVENING  . aspirin EC 81 MG tablet Take 1 tablet (81 mg total) by mouth daily.  Marland Kitchen docusate sodium (COLACE)  100 MG capsule Take 100 mg by mouth 2 (two) times daily as needed.   . fexofenadine (ALLEGRA) 180 MG tablet Take 180 mg by mouth as needed for allergies or rhinitis.  . furosemide (LASIX) 40 MG tablet Take 1 tablet (40 mg total) by mouth daily.  Marland Kitchen lactose free nutrition (BOOST PLUS) LIQD Take 237 mLs by mouth daily.  Derrill Memo ON 08/30/2020] meloxicam (MOBIC) 7.5 MG tablet Take 7.5 mg by mouth daily.  . Menthol, Topical Analgesic, (BIOFREEZE) 4 % GEL Apply topically 2 (two) times daily as needed.  . metoprolol succinate (TOPROL-XL) 25 MG 24 hr tablet TAKE 1.5 TABLETS (37.5 MG TOTAL) BY MOUTH DAILY.  . Multiple Vitamin (MULTIVITAMIN) tablet Take 1 tablet by mouth  daily. chewable  . omeprazole (PRILOSEC) 20 MG capsule Take 20 mg by mouth daily.  . traMADol (ULTRAM) 50 MG tablet Take by mouth every 6 (six) hours as needed.   No facility-administered encounter medications on file as of 08/29/2020.    Review of Systems:  Review of Systems  Constitutional: Positive for activity change.  HENT: Negative.   Respiratory: Negative.   Cardiovascular: Positive for leg swelling.  Gastrointestinal: Negative.   Genitourinary: Positive for frequency.  Musculoskeletal: Positive for joint swelling.  Skin: Negative.   Neurological: Negative.   Psychiatric/Behavioral: Negative.     Health Maintenance  Topic Date Due  . DEXA SCAN  Never done  . INFLUENZA VACCINE  07/30/2020  . TETANUS/TDAP  07/11/2025  . COVID-19 Vaccine  Completed  . PNA vac Low Risk Adult  Completed    Physical Exam: Vitals:   08/29/20 1500  BP: 130/72  Pulse: 72  Resp: 20  Temp: 97.9 F (36.6 C)  SpO2: 93%  Weight: 126 lb 3.2 oz (57.2 kg)  Height: 5\' 4"  (1.626 m)   Body mass index is 21.66 kg/m. Physical Exam Vitals reviewed.  Constitutional:      Appearance: Normal appearance.  HENT:     Head: Normocephalic.     Nose: Nose normal.     Mouth/Throat:     Mouth: Mucous membranes are moist.     Pharynx: Oropharynx is clear.  Eyes:     Pupils: Pupils are equal, round, and reactive to light.  Cardiovascular:     Rate and Rhythm: Normal rate and regular rhythm.     Pulses: Normal pulses.  Pulmonary:     Effort: Pulmonary effort is normal. No respiratory distress.     Breath sounds: Normal breath sounds. No wheezing.  Abdominal:     General: Abdomen is flat. Bowel sounds are normal.     Palpations: Abdomen is soft.  Musculoskeletal:     Cervical back: Neck supple.     Comments: Mild swelling in Right Ankle. C/o Pain in outer side of Ankle  Skin:    General: Skin is warm and dry.  Neurological:     General: No focal deficit present.     Mental Status: She is alert  and oriented to person, place, and time.  Psychiatric:        Mood and Affect: Mood normal.        Thought Content: Thought content normal.     Labs reviewed: Basic Metabolic Panel: Recent Labs    09/08/19 0843 09/08/19 0843 10/03/19 1930 10/03/19 2344 10/03/19 2359 10/04/19 7741 10/05/19 0511 10/07/19 0442 10/07/19 0442 11/03/19 1411 11/03/19 1411 02/15/20 1448 02/23/20 1540 05/11/20 0000  NA 137  --    < >  --   --  139   < > 139   < > 137  --  136 139 141  K 3.7  --    < >  --   --  3.6   < > 3.2*   < > 3.9   < > 5.3* 4.7 3.9  CL 101  --    < >  --   --  105   < > 99   < > 97   < > 95* 98 103  CO2 28  --    < >  --   --  29   < > 30   < > 35*   < > 29 26 33*  GLUCOSE 132*  --    < >  --   --  116*   < > 97   < > 94  --  86 80  --   BUN 17  --    < >  --   --  10   < > 21   < > 22  --  19 15 10   CREATININE 0.37*  --    < >  --   --  0.35*   < > 0.44   < > 0.61   < > 0.58 0.64 0.5  CALCIUM 8.8*  --    < >  --   --  8.6*   < > 8.6*   < > 9.5   < > 9.9 9.7 9.3  MG 2.2   < >  --   --  2.0 1.9  --  2.0  --   --   --   --   --   --   PHOS 4.1  --   --   --   --   --   --  3.7  --   --   --   --   --   --   TSH  --   --   --  2.526  --   --   --   --   --   --   --   --   --  1.90   < > = values in this interval not displayed.   Liver Function Tests: Recent Labs    09/08/19 0843 09/08/19 0843 10/03/19 1930 10/07/19 0442 05/11/20 0000  AST 36  --  16  --  10*  ALT 81*  --  21  --  10  ALKPHOS 247*  --  127*  --  72  BILITOT 0.9  --  0.6  --   --   PROT 5.3*  --  6.1*  --   --   ALBUMIN 2.7*   < > 3.6 2.8* 3.7   < > = values in this interval not displayed.   No results for input(s): LIPASE, AMYLASE in the last 8760 hours. No results for input(s): AMMONIA in the last 8760 hours. CBC: Recent Labs    10/03/19 1930 10/03/19 1930 10/04/19 0511 11/03/19 1411 05/11/20 0000  WBC 7.3   < > 6.0 6.3 5.7  NEUTROABS 5.3  --   --  4.7 3,523  HGB 11.2*   < > 9.7* 11.3*  12.2  HCT 34.9*   < > 30.2* 33.8* 36  MCV 97.8  --  96.8 93.4  --   PLT 297   < > 268 249.0 239   < > = values in this interval not displayed.   Lipid Panel:  No results for input(s): CHOL, HDL, LDLCALC, TRIG, CHOLHDL, LDLDIRECT in the last 8760 hours. Lab Results  Component Value Date   HGBA1C 5.5 07/12/2015    Procedures since last visit: No results found.  Assessment/Plan Acute right ankle pain  Xray of Ankle Meloxicam for 2 week  Addendum Xray came back with possible Calcaneal Fracture Nondisplaced.  Made Urgent Appointment with Ortho Stay Off the Foot.  Left knee pain, unspecified chronicity Better on Celebrex for 4 weeks and Injection per ortho Essential hypertension On Norvasc and Toprol Palpitaion history Stable on Toprol Age-related osteoporosis without current pathological fracture Will see if she agress with outside DEXA Has failed Fosamax Anxiety and depression No Med Doing better now Chronic congestive heart failure, unspecified heart failure type (HCC) Weight is stable on Lasix  Continue Same dose for now Check BUN and Creat Gastroesophageal reflux disease without esophagitis On Prilosec  Labs/tests ordered:  * No order type specified * Next appt:  Visit date not found

## 2020-08-30 ENCOUNTER — Encounter: Payer: Self-pay | Admitting: Nurse Practitioner

## 2020-08-30 DIAGNOSIS — S92014A Nondisplaced fracture of body of right calcaneus, initial encounter for closed fracture: Secondary | ICD-10-CM | POA: Insufficient documentation

## 2020-08-31 DIAGNOSIS — I10 Essential (primary) hypertension: Secondary | ICD-10-CM | POA: Diagnosis not present

## 2020-08-31 DIAGNOSIS — M79671 Pain in right foot: Secondary | ICD-10-CM | POA: Diagnosis not present

## 2020-08-31 DIAGNOSIS — R609 Edema, unspecified: Secondary | ICD-10-CM | POA: Diagnosis not present

## 2020-08-31 DIAGNOSIS — Z79899 Other long term (current) drug therapy: Secondary | ICD-10-CM | POA: Diagnosis not present

## 2020-09-01 ENCOUNTER — Other Ambulatory Visit: Payer: Self-pay | Admitting: Internal Medicine

## 2020-09-01 DIAGNOSIS — M810 Age-related osteoporosis without current pathological fracture: Secondary | ICD-10-CM

## 2020-09-01 LAB — CBC AND DIFFERENTIAL
HCT: 37 (ref 36–46)
Hemoglobin: 12.3 (ref 12.0–16.0)
Neutrophils Absolute: 4481
Platelets: 322 (ref 150–399)
WBC: 6.8

## 2020-09-01 LAB — BASIC METABOLIC PANEL
BUN: 13 (ref 4–21)
CO2: 28 — AB (ref 13–22)
Chloride: 104 (ref 99–108)
Creatinine: 0.5 (ref 0.5–1.1)
Glucose: 88
Potassium: 4.1 (ref 3.4–5.3)
Sodium: 141 (ref 137–147)

## 2020-09-01 LAB — HEPATIC FUNCTION PANEL
ALT: 11 (ref 7–35)
AST: 11 — AB (ref 13–35)
Alkaline Phosphatase: 135 — AB (ref 25–125)
Bilirubin, Total: 0.4

## 2020-09-01 LAB — COMPREHENSIVE METABOLIC PANEL
Albumin: 3.9 (ref 3.5–5.0)
Calcium: 9.6 (ref 8.7–10.7)
Globulin: 2.1

## 2020-09-01 LAB — CBC: RBC: 3.99 (ref 3.87–5.11)

## 2020-09-21 ENCOUNTER — Other Ambulatory Visit: Payer: Self-pay | Admitting: Internal Medicine

## 2020-09-21 DIAGNOSIS — M810 Age-related osteoporosis without current pathological fracture: Secondary | ICD-10-CM

## 2020-09-21 DIAGNOSIS — Z13820 Encounter for screening for osteoporosis: Secondary | ICD-10-CM

## 2020-09-25 ENCOUNTER — Ambulatory Visit
Admission: RE | Admit: 2020-09-25 | Discharge: 2020-09-25 | Disposition: A | Discharge status: Home / Self Care | Payer: MEDICARE | Source: Ambulatory Visit | Attending: Internal Medicine | Admitting: Internal Medicine

## 2020-09-25 ENCOUNTER — Other Ambulatory Visit: Payer: Self-pay

## 2020-09-25 DIAGNOSIS — Z780 Asymptomatic menopausal state: Secondary | ICD-10-CM | POA: Diagnosis not present

## 2020-09-25 DIAGNOSIS — Z13820 Encounter for screening for osteoporosis: Secondary | ICD-10-CM

## 2020-09-25 DIAGNOSIS — M810 Age-related osteoporosis without current pathological fracture: Secondary | ICD-10-CM | POA: Diagnosis not present

## 2020-09-26 ENCOUNTER — Encounter: Payer: Self-pay | Admitting: Internal Medicine

## 2020-09-26 ENCOUNTER — Non-Acute Institutional Stay: Payer: MEDICARE | Admitting: Internal Medicine

## 2020-09-26 DIAGNOSIS — M25552 Pain in left hip: Secondary | ICD-10-CM | POA: Diagnosis not present

## 2020-09-26 DIAGNOSIS — M79605 Pain in left leg: Secondary | ICD-10-CM

## 2020-09-26 DIAGNOSIS — F419 Anxiety disorder, unspecified: Secondary | ICD-10-CM

## 2020-09-26 DIAGNOSIS — I10 Essential (primary) hypertension: Secondary | ICD-10-CM

## 2020-09-26 DIAGNOSIS — M810 Age-related osteoporosis without current pathological fracture: Secondary | ICD-10-CM

## 2020-09-26 DIAGNOSIS — I509 Heart failure, unspecified: Secondary | ICD-10-CM | POA: Diagnosis not present

## 2020-09-26 DIAGNOSIS — F329 Major depressive disorder, single episode, unspecified: Secondary | ICD-10-CM

## 2020-09-26 NOTE — Progress Notes (Signed)
Location:   [place] Room Number: [place] of Service:  ALF [phone]) Provider:  Veleta Miners MD  [name], MD  Patient Care Team: [name], MD as PCP - General (Internal Medicine) [name], MD as Consulting Physician (Orthopedic Surgery)  Extended Emergency Contact Information Primary Emergency Contact: cook,carolyn Mobile Phone: [phone] Relation: Friend Preferred language: English  Code Status:  Full Code Goals of care: Advanced Directive information Advanced Directives 08/29/2020  Does Patient Have a Medical Advance Directive? Yes  Type of Advance Directive Out of facility DNR (pink MOST or yellow form);Healthcare Power of Attorney  Does patient want to make changes to medical advance directive? No - Patient declined  Copy of [place] in Chart? -  Would patient like information on creating a medical advance directive? -  Pre-existing out of facility DNR order (yellow form or pink MOST form) Pink MOST form placed in chart (order not valid for inpatient use)     Chief Complaint  Patient presents with  . Acute Visit    Left hip pain    HPI:  Pt is a 86 y.o. female seen today for an acute visit for left hip and groin pain  Has history of diastolic CHF with echo showing LVH and severe left atrial enlargement possible amyloidosis,hypertension, history of palpitations,CAD,history of right hip fracture in 9/20,osteoporosis and anemia,hypertensive retinopathy Recent Calcaneal Non displaced Fracture of Right Foot  Patient recently went for DEXA scan and since then she has been c/o Pain in her Left Hip and Groin are. Says pain is more when she tries to walk and put pressure on it No Recent falls. No swelling Her DEXA scan showed T score of -4.6  Past Medical History:  Diagnosis Date  . Aortic heart murmur    AS/AI on 2D ECHO ; SBE prophylaxis Rxed  . Chest pain 2007 ; 2011   negative cardiac assessment    . Hyperlipidemia   . Hypertension [  . Interstitial cystitis    Resolved  . Ocular migraine 04/10/2012  . Osteoporosis    T score - 3.0 @ femoral neck; Fosamax affected swallowing  . Postmenopausal    No PMH or HRT   Past Surgical History:  Procedure Laterality Date  . ANTERIOR APPROACH HEMI HIP ARTHROPLASTY Right 09/03/2019   Procedure: ANTERIOR APPROACH HEMI HIP ARTHROPLASTY BIPOLAR;  Surgeon: [name], MD;  Location: [facility];  Service: Orthopedics;  Laterality: Right;  . COLONOSCOPY  2011   Dr Sharlett Iles    Allergies  Allergen Reactions  . Amitid [Amitriptyline Hcl]     Patient experienced a dizzy, spinning feeling   . Metronidazole     REACTION: throat swells  . Penicillins     REACTION: rash & fever  . Alendronate Sodium     REACTION: unspecified  . Clarithromycin     GI intolerance  . Flonase [Fluticasone Propionate]     Slight Headache   . Promethazine Hcl     REACTION: unspecified  . Robitussin (Alcohol Free) [Guaifenesin] Other (See Comments)    unknown hyperactive  . Spironolactone     09/21/13 weakness in legs  . Singulair [Montelukast Sodium]     "spacey"; ineffective    Allergies as of 09/26/2020      Reactions   Amitid [amitriptyline Hcl]    Patient experienced a dizzy, spinning feeling    Metronidazole    REACTION: throat swells   Penicillins    REACTION: rash &  fever   Alendronate Sodium    REACTION: unspecified   Clarithromycin    GI intolerance   Flonase [fluticasone Propionate]    Slight Headache    Promethazine Hcl    REACTION: unspecified   Robitussin (alcohol Free) [guaifenesin] Other (See Comments)   unknown hyperactive   Spironolactone    09/21/13 weakness in legs   Singulair [montelukast Sodium]    "spacey"; ineffective      Medication List       Accurate as of September 26, 2020  2:50 PM. If you have any questions, ask your nurse or doctor.        acetaminophen 500 MG tablet Commonly known as: TYLENOL Take 1,000 mg  by mouth every 6 (six) hours as needed for moderate pain.   acetaminophen 325 MG tablet Commonly known as: TYLENOL Take 650 mg by mouth every 6 (six) hours as needed.   amLODipine 5 MG tablet Commonly known as: NORVASC TAKE 1 TABLET BY MOUTH EVERY DAY IN THE EVENING   aspirin EC 81 MG tablet Take 1 tablet (81 mg total) by mouth daily.   Biofreeze 4 % Gel Generic drug: Menthol (Topical Analgesic) Apply topically 2 (two) times daily as needed.   docusate sodium 100 MG capsule Commonly known as: COLACE Take 100 mg by mouth 2 (two) times daily as needed.   fexofenadine 180 MG tablet Commonly known as: ALLEGRA Take 180 mg by mouth as needed for allergies or rhinitis.   furosemide 40 MG tablet Commonly known as: LASIX Take 1 tablet (40 mg total) by mouth daily.   lactose free nutrition Liqd Take 237 mLs by mouth daily.   metoprolol succinate 25 MG 24 hr tablet Commonly known as: TOPROL-XL TAKE 1.5 TABLETS (37.5 MG TOTAL) BY MOUTH DAILY.   multivitamin tablet Take 1 tablet by mouth daily. chewable   omeprazole 20 MG capsule Commonly known as: PRILOSEC Take 20 mg by mouth daily.   traMADol 50 MG tablet Commonly known as: ULTRAM Take by mouth every 6 (six) hours as needed.       Review of Systems  Constitutional: Positive for activity change.  HENT: Negative.   Respiratory: Negative.   Cardiovascular: Negative.   Gastrointestinal: Negative.   Genitourinary: Negative.   Musculoskeletal: Positive for arthralgias, gait problem and myalgias.  Skin: Negative.   Neurological: Positive for weakness.  Psychiatric/Behavioral: Positive for dysphoric mood.    Immunization History  Administered Date(s) Administered  . Fluad Quad(high Dose 65+) 10/05/2019  . Influenza Split 12/10/2011, 09/29/2012  . Influenza, High Dose Seasonal PF 09/21/2014, 08/27/2016, 08/27/2017, 09/23/2018  . Influenza,inj,Quad PF,6+ Mos 10/08/2013, 09/12/2015  . PFIZER SARS-COV-2 Vaccination  02/28/2020, 03/29/2020  . Pneumococcal Conjugate-13 08/22/2015  . Pneumococcal Polysaccharide-23 11/23/2008  . Td 03/23/2004  . Tdap 07/12/2015   Pertinent  Health Maintenance Due  Topic Date Due  . INFLUENZA VACCINE  07/30/2020  . DEXA SCAN  Completed  . PNA vac Low Risk Adult  Completed   Fall Risk  09/23/2018 09/19/2017 09/04/2016 08/20/2016 12/20/2014  Falls in the past year? No No No No No  Comment - - [place] Telephone Survey: data to providers prior to load - -   Functional Status Survey:    Vitals:   09/26/20 1440  BP: 126/68  Pulse: 70  Resp: 20  Temp: 98.4 F (36.9 C)  SpO2: 95%  Weight: 125 lb 12.8 oz (57.1 kg)  Height: 5\' 4"  (1.626 m)   Body mass index is 21.59 kg/m. Physical Exam Vitals  reviewed.  Constitutional:      Appearance: Normal appearance.  HENT:     Head: Normocephalic.     Nose: Nose normal.     Mouth/Throat:     Mouth: Mucous membranes are moist.     Pharynx: Oropharynx is clear.  Eyes:     Pupils: Pupils are equal, round, and reactive to light.  Cardiovascular:     Rate and Rhythm: Normal rate and regular rhythm.     Pulses: Normal pulses.  Pulmonary:     Effort: Pulmonary effort is normal.     Breath sounds: Normal breath sounds.  Abdominal:     General: Abdomen is flat. Bowel sounds are normal.     Palpations: Abdomen is soft.  Musculoskeletal:     Cervical back: Neck supple.     Comments: No Pain with Abduction and Adduction of Left hip C/o Pain in Groin area with Flexion  Skin:    General: Skin is warm.  Neurological:     General: No focal deficit present.     Mental Status: She is alert.     Labs reviewed: Recent Labs    10/03/19 1930 10/03/19 2359 10/04/19 0511 10/05/19 0511 10/07/19 0442 10/07/19 0442 11/03/19 1411 11/03/19 1411 02/15/20 1448 02/15/20 1448 02/23/20 1540 05/11/20 0000 09/01/20 0000  NA   < >  --  139   < > 139   < > 137  --  136   < > 139 141 141  K   < >  --  3.6   < > 3.2*   < > 3.9   < > 5.3*    < > 4.7 3.9 4.1  CL   < >  --  105   < > 99   < > 97   < > 95*   < > 98 103 104  CO2   < >  --  29   < > 30   < > 35*   < > 29   < > 26 33* 28*  GLUCOSE   < >  --  116*   < > 97   < > 94  --  86  --  80  --   --   BUN   < >  --  10   < > 21   < > 22  --  19   < > 15 10 13   CREATININE   < >  --  0.35*   < > 0.44   < > 0.61   < > 0.58   < > 0.64 0.5 0.5  CALCIUM   < >  --  8.6*   < > 8.6*   < > 9.5   < > 9.9   < > 9.7 9.3 9.6  MG  --  2.0 1.9  --  2.0  --   --   --   --   --   --   --   --   PHOS  --   --   --   --  3.7  --   --   --   --   --   --   --   --    < > = values in this interval not displayed.   Recent Labs    10/03/19 1930 10/03/19 1930 10/07/19 0442 05/11/20 0000 09/01/20 0000  AST 16  --   --  10* 11*  ALT 21  --   --  10 11  ALKPHOS 127*  --   --  72 135*  BILITOT 0.6  --   --   --   --   PROT 6.1*  --   --   --   --   ALBUMIN 3.6   < > 2.8* 3.7 3.9   < > = values in this interval not displayed.   Recent Labs    10/03/19 1930 10/03/19 1930 10/04/19 0511 10/04/19 0511 11/03/19 1411 05/11/20 0000 09/01/20 0000  WBC 7.3   < > 6.0   < > 6.3 5.7 6.8  NEUTROABS 5.3   < >  --   --  4.7 3,523 4,481  HGB 11.2*   < > 9.7*   < > 11.3* 12.2 12.3  HCT 34.9*   < > 30.2*   < > 33.8* 36 37  MCV 97.8  --  96.8  --  93.4  --   --   PLT 297   < > 268   < > 249.0 239 322   < > = values in this interval not displayed.   Lab Results  Component Value Date   TSH 1.90 05/11/2020   Lab Results  Component Value Date   HGBA1C 5.5 07/12/2015   Lab Results  Component Value Date   CHOL 227 (H) 06/24/2013   HDL 89.10 06/24/2013   LDLDIRECT 114.7 06/24/2013   TRIG 80.0 06/24/2013   CHOLHDL 3 06/24/2013    Significant Diagnostic Results in last 30 days:  DG Bone Density  Result Date: 09/25/2020 EXAM: DUAL X-RAY ABSORPTIOMETRY (DXA) FOR BONE MINERAL DENSITY IMPRESSION: Referring Physician:  [name] Your patient completed a BMD test using Lunar IDXA DXA system (  analysis version: 16 ) manufactured by [employer]. Technologist: AW PATIENT: Name: Alicia Boyd, Alicia Boyd Patient ID: [id number] Birth Date: 21-Apr-1930 Height: 64.0 in. Sex: Female Measured: 09/25/2020 Weight: 125.4 lbs. Indications: Advanced Age, Caucasian, Estrogen Deficient, Omeprazole, Osteoporosis (733), Postmenopausal Fractures: Right hip Treatments: None ASSESSMENT: The BMD measured at Femur Total is 0.428 g/cm2 with a T-score of -4.6. This patient is considered osteoporotic according to [facility]) criteria. The scan quality is good. L-4 was excluded due to degenerative changes. Right femur was excluded due to surgical hardware. Site Region Measured Date Measured Age YA BMD Significant CHANGE T-score AP Spine   L1-L3  09[age]25/2020    87.9         -4.7    0.625 g/cm2 Left Femur Neck   09/25/2020    87.9         -3.6    0.543 g/cm2 Left Femur Total  09/25/2020    87.9         -4.6    0.428 g/cm2 World Health Organization [facility]) criteria for post-menopausal, Caucasian Women: Normal       T-score at or above -1 SD Osteopenia   T-score between -1 and -2.5 SD Osteoporosis T-score at or below -2.5 SD RECOMMENDATION: 1. All patients should optimize calcium and vitamin D intake. 2. Consider FDA approved medical therapies in postmenopausal women and men aged 60 years and older, based on the following: a. A hip or vertebral (clinical or morphometric) fracture b. T- score < or = -2.5 at the femoral neck or spine after appropriate evaluation to exclude secondary causes c. Low bone mass (T-score between -1.0 and -2.5 at the femoral neck or spine) and a 10 year probability of a hip fracture > or = 3% or a  10 year probability of a major osteoporosis-related fracture > or = 20% based on the [place]-adapted WHO algorithm d. Clinician judgment and/or patient preferences may indicate treatment for people with 10-year fracture probabilities above or below these levels FOLLOW-UP: Patients with diagnosis of osteoporosis  or at high risk for fracture should have regular bone mineral density tests. For patients eligible for Medicare, routine testing is allowed once every 2 years. The testing frequency can be increased to one year for patients who have rapidly progressing disease, those who are receiving or discontinuing medical therapy to restore bone mass, or have additional risk factors. I have reviewed this report and agree with the above findings. [facility] Electronically Signed   By: Lowella Grip III M.D.   On: 09/25/2020 11:28    Assessment/Plan Left leg pain Xray if Left Hip and Pelvic Area Tylenol and tramadol PRN for pain Age-related osteoporosis without current pathological fracture T score -4 She has failed Fosamax before Will need Prolia Essential hypertension Continue Norvasc and Lasix and Toprol Anxiety and depression No Meds for now Chronic congestive heart failure, unspecified heart failure type (HCC) Low dose of Lasix Bun and Creat are stable GERD On Prilosec Palpitations Continue Toprol   Family/ staff Communication:   Labs/tests ordered:

## 2020-10-02 ENCOUNTER — Other Ambulatory Visit: Payer: Self-pay

## 2020-10-02 MED ORDER — TRAMADOL HCL 50 MG PO TABS
50.0000 mg | ORAL_TABLET | Freq: Four times a day (QID) | ORAL | 0 refills | Status: DC | PRN
Start: 2020-10-02 — End: 2020-10-06

## 2020-10-02 NOTE — Telephone Encounter (Signed)
Refill request received from [facility] for Tramadol 50 mg tablet one tablet every 6 hours prn. Medication pended and sent to Dr. Lyndel Safe for approval.

## 2020-10-03 ENCOUNTER — Non-Acute Institutional Stay: Payer: MEDICARE | Admitting: Internal Medicine

## 2020-10-03 DIAGNOSIS — M79605 Pain in left leg: Secondary | ICD-10-CM | POA: Diagnosis not present

## 2020-10-03 DIAGNOSIS — I10 Essential (primary) hypertension: Secondary | ICD-10-CM | POA: Diagnosis not present

## 2020-10-03 DIAGNOSIS — M810 Age-related osteoporosis without current pathological fracture: Secondary | ICD-10-CM | POA: Diagnosis not present

## 2020-10-03 NOTE — Progress Notes (Signed)
Location: [place] Room Number: 01/12/1930 Place of Service:  ALF [phone])  Provider: Veleta Miners MD  Code Status: Full Code Goals of Care:  Advanced Directives 08/29/2020  Does Patient Have a Medical Advance Directive? Yes  Type of Advance Directive Out of facility DNR (pink MOST or yellow form);Healthcare Power of Attorney  Does patient want to make changes to medical advance directive? No - Patient declined  Copy of [place] in Chart? -  Would patient like information on creating a medical advance directive? -  Pre-existing out of facility DNR order (yellow form or pink MOST form) Pink MOST form placed in chart (order not valid for inpatient use)     Chief Complaint  Patient presents with  . Acute Visit    Pain control    HPI: Patient is a 86 y.o. female seen today for an acute visit for Continue Pain in her Left Groin area and Hip   Has history of diastolic CHF with echo showing LVH and severe left atrial enlargement possible amyloidosis,hypertension, history of palpitations,CAD,history of right hip fracture in 9/20,osteoporosis and anemia,hypertensive retinopathy Recent Calcaneal Non displaced Fracture of Right Foot  Continues to c/o Pain in left Groin area and Left Hip Xray done few days ago showed no Acute Fracture  Pain More when she walks. Wants to know if she can have something stronger then Tylenol for pain. Tramadol seems to be helping  Past Medical History:  Diagnosis Date  . Aortic heart murmur    AS/AI on 2D ECHO ; SBE prophylaxis Rxed  . Chest pain 2007 ; 2011   negative cardiac assessment  . Hyperlipidemia   . Hypertension [  . Interstitial cystitis    Resolved  . Ocular migraine 04/10/2012  . Osteoporosis    T score - 3.0 @ femoral neck; Fosamax affected swallowing  . Postmenopausal    No PMH or HRT    Past Surgical History:  Procedure Laterality Date  . ANTERIOR APPROACH HEMI HIP ARTHROPLASTY  Right 09/03/2019   Procedure: ANTERIOR APPROACH HEMI HIP ARTHROPLASTY BIPOLAR;  Surgeon: [name], MD;  Location: [facility];  Service: Orthopedics;  Laterality: Right;  . COLONOSCOPY  2011   Dr Sharlett Iles    Allergies  Allergen Reactions  . Amitid [Amitriptyline Hcl]     Patient experienced a dizzy, spinning feeling   . Metronidazole     REACTION: throat swells  . Penicillins     REACTION: rash & fever  . Alendronate Sodium     REACTION: unspecified  . Clarithromycin     GI intolerance  . Flonase [Fluticasone Propionate]     Slight Headache   . Promethazine Hcl     REACTION: unspecified  . Robitussin (Alcohol Free) [Guaifenesin] Other (See Comments)    unknown hyperactive  . Spironolactone     09/21/13 weakness in legs  . Singulair [Montelukast Sodium]     "spacey"; ineffective    Outpatient Encounter Medications as of 10/03/2020  Medication Sig  . acetaminophen (TYLENOL) 325 MG tablet Take 650 mg by mouth every 6 (six) hours as needed.  Marland Kitchen acetaminophen (TYLENOL) 500 MG tablet Take 1,000 mg by mouth every 6 (six) hours as needed for moderate pain.  Marland Kitchen amLODipine (NORVASC) 5 MG tablet TAKE 1 TABLET BY MOUTH EVERY DAY IN THE EVENING  . aspirin EC 81 MG tablet Take 1 tablet (81 mg total) by mouth daily.  Marland Kitchen docusate sodium (COLACE) 100 MG capsule Take 100 mg  by mouth 2 (two) times daily as needed.   . fexofenadine (ALLEGRA) 180 MG tablet Take 180 mg by mouth as needed for allergies or rhinitis.  . furosemide (LASIX) 40 MG tablet Take 1 tablet (40 mg total) by mouth daily.  Marland Kitchen lactose free nutrition (BOOST PLUS) LIQD Take 237 mLs by mouth daily.  . Menthol, Topical Analgesic, (BIOFREEZE) 4 % GEL Apply topically 2 (two) times daily as needed.  . metoprolol succinate (TOPROL-XL) 25 MG 24 hr tablet TAKE 1.5 TABLETS (37.5 MG TOTAL) BY MOUTH DAILY.  . Multiple Vitamin (MULTIVITAMIN) tablet Take 1 tablet by mouth daily. chewable  . omeprazole (PRILOSEC) 20 MG capsule Take 20 mg by mouth  daily.  . traMADol (ULTRAM) 50 MG tablet Take 1 tablet (50 mg total) by mouth every 6 (six) hours as needed. (Patient taking differently: Take 50 mg by mouth 4 (four) times daily. )   No facility-administered encounter medications on file as of 10/03/2020.    Review of Systems:  Review of Systems  Constitutional: Positive for activity change.  HENT: Negative.   Respiratory: Negative.   Cardiovascular: Positive for leg swelling.  Gastrointestinal: Negative.   Genitourinary: Negative.   Musculoskeletal: Positive for arthralgias, gait problem and myalgias.  Skin: Negative.   Neurological: Negative for dizziness.  Psychiatric/Behavioral: Negative.     Health Maintenance  Topic Date Due  . INFLUENZA VACCINE  07/30/2020  . TETANUS/TDAP  07/11/2025  . DEXA SCAN  Completed  . COVID-19 Vaccine  Completed  . PNA vac Low Risk Adult  Completed    Physical Exam: Vitals:   10/04/20 1255  BP: 120/64  Pulse: 71  Resp: 20  Temp: 98 F (36.7 C)  SpO2: 95%  Weight: 122 lb (55.3 kg)  Height: 5\' 4"  (1.626 m)   Body mass index is 20.94 kg/m. Physical Exam Vitals reviewed.  Constitutional:      Appearance: Normal appearance.  HENT:     Head: Normocephalic.     Nose: Nose normal.     Mouth/Throat:     Mouth: Mucous membranes are moist.     Pharynx: Oropharynx is clear.  Eyes:     Pupils: Pupils are equal, round, and reactive to light.  Cardiovascular:     Rate and Rhythm: Normal rate and regular rhythm.     Pulses: Normal pulses.  Pulmonary:     Effort: Pulmonary effort is normal.  Abdominal:     General: Abdomen is flat. Bowel sounds are normal.     Palpations: Abdomen is soft.  Musculoskeletal:        General: No swelling.     Cervical back: Neck supple.     Comments: No Pain with Abduction and Adduction of Left hip C/o Pain in Groin area with Flexion   Skin:    General: Skin is warm.  Neurological:     General: No focal deficit present.     Mental Status: She is  alert and oriented to person, place, and time.  Psychiatric:        Mood and Affect: Mood normal.        Thought Content: Thought content normal.     Labs reviewed: Basic Metabolic Panel: Recent Labs    10/07/19 0442 10/07/19 0442 11/03/19 1411 11/03/19 1411 02/15/20 1448 02/15/20 1448 02/23/20 1540 05/11/20 0000 09/01/20 0000  NA 139   < > 137  --  136   < > 139 141 141  K 3.2*   < > 3.9   < >  5.3*   < > 4.7 3.9 4.1  CL 99   < > 97   < > 95*   < > 98 103 104  CO2 30   < > 35*   < > 29   < > 26 33* 28*  GLUCOSE 97   < > 94  --  86  --  80  --   --   BUN 21   < > 22  --  19   < > 15 10 13   CREATININE 0.44   < > 0.61   < > 0.58   < > 0.64 0.5 0.5  CALCIUM 8.6*   < > 9.5   < > 9.9   < > 9.7 9.3 9.6  MG 2.0  --   --   --   --   --   --   --   --   PHOS 3.7  --   --   --   --   --   --   --   --   TSH  --   --   --   --   --   --   --  1.90  --    < > = values in this interval not displayed.   Liver Function Tests: Recent Labs    10/07/19 0442 05/11/20 0000 09/01/20 0000  AST  --  10* 11*  ALT  --  10 11  ALKPHOS  --  72 135*  ALBUMIN 2.8* 3.7 3.9   No results for input(s): LIPASE, AMYLASE in the last 8760 hours. No results for input(s): AMMONIA in the last 8760 hours. CBC: Recent Labs    11/03/19 1411 05/11/20 0000 09/01/20 0000  WBC 6.3 5.7 6.8  NEUTROABS 4.7 3,523 4,481  HGB 11.3* 12.2 12.3  HCT 33.8* 36 37  MCV 93.4  --   --   PLT 249.0 239 322   Lipid Panel: No results for input(s): CHOL, HDL, LDLCALC, TRIG, CHOLHDL, LDLDIRECT in the last 8760 hours. Lab Results  Component Value Date   HGBA1C 5.5 07/12/2015    Procedures since last visit: DG Bone Density  Result Date: 09/25/2020 EXAM: DUAL X-RAY ABSORPTIOMETRY (DXA) FOR BONE MINERAL DENSITY IMPRESSION: Referring Physician:  [name] Your patient completed a BMD test using Lunar IDXA DXA system ( analysis version: 16 ) manufactured by [employer]. Technologist: AW PATIENT: Name: Mirakle, Tomlin Patient ID: [id number] Birth Date: 09-24-1930 Height: 64.0 in. Sex: Female Measured: 09/25/2020 Weight: 125.4 lbs. Indications: Advanced Age, Caucasian, Estrogen Deficient, Omeprazole, Osteoporosis (733), Postmenopausal Fractures: Right hip Treatments: None ASSESSMENT: The BMD measured at Femur Total is 0.428 g/cm2 with a T-score of -4.6. This patient is considered osteoporotic according to [facility]) criteria. The scan quality is good. L-4 was excluded due to degenerative changes. Right femur was excluded due to surgical hardware. Site Region Measured Date Measured Age YA BMD Significant CHANGE T-score AP Spine   L1-L3  09[age]25/2020    87.9         -4.7    0.625 g/cm2 Left Femur Neck   09/25/2020    87.9         -3.6    0.543 g/cm2 Left Femur Total  09/25/2020    87.9         -4.6    0.428 g/cm2 World Health Organization [facility]) criteria for post-menopausal, Caucasian Women: Normal       T-score at or  above -1 SD Osteopenia   T-score between -1 and -2.5 SD Osteoporosis T-score at or below -2.5 SD RECOMMENDATION: 1. All patients should optimize calcium and vitamin D intake. 2. Consider FDA approved medical therapies in postmenopausal women and men aged 93 years and older, based on the following: a. A hip or vertebral (clinical or morphometric) fracture b. T- score < or = -2.5 at the femoral neck or spine after appropriate evaluation to exclude secondary causes c. Low bone mass (T-score between -1.0 and -2.5 at the femoral neck or spine) and a 10 year probability of a hip fracture > or = 3% or a 10 year probability of a major osteoporosis-related fracture > or = 20% based on the [place]-adapted WHO algorithm d. Clinician judgment and/or patient preferences may indicate treatment for people with 10-year fracture probabilities above or below these levels FOLLOW-UP: Patients with diagnosis of osteoporosis or at high risk for fracture should have regular bone mineral density tests. For patients  eligible for Medicare, routine testing is allowed once every 2 years. The testing frequency can be increased to one year for patients who have rapidly progressing disease, those who are receiving or discontinuing medical therapy to restore bone mass, or have additional risk factors. I have reviewed this report and agree with the above findings. [facility] Radiology Electronically Signed   By: Lowella Grip III M.D.   On: 09/25/2020 11:28    Assessment/Plan Left leg pain Will Schedule the Tramadol 50mg  QID Continue Tylenol also  Age-related osteoporosis without current pathological fracture T score -4.6 Could not tolerate Fosamax before due to issues with swallowing D/W the patient she is not sure yet about Prolia. will first get approval with Pharmacy then discuss with her again  Other issues Essential hypertension Continue Norvasc and Lasix and Toprol CHF On Lasix GERD On Prilosec Palpitations Continue Toprol  Labs/tests ordered:  * No order type specified * Next appt:  Visit date not found

## 2020-10-04 ENCOUNTER — Encounter: Payer: Self-pay | Admitting: Internal Medicine

## 2020-10-05 ENCOUNTER — Other Ambulatory Visit: Payer: Self-pay | Admitting: Family Medicine

## 2020-10-06 ENCOUNTER — Other Ambulatory Visit: Payer: Self-pay

## 2020-10-06 MED ORDER — TRAMADOL HCL 50 MG PO TABS
50.0000 mg | ORAL_TABLET | Freq: Four times a day (QID) | ORAL | 0 refills | Status: DC | PRN
Start: 2020-10-06 — End: 2020-10-16

## 2020-10-06 NOTE — Telephone Encounter (Signed)
[place] sent a fax about medication "Tramadol 50mg " Quantity 60 tablets for patient to take by mouth every 6 hours as needed for pain. Medication pend and sent to provider. Please Advise.

## 2020-10-09 ENCOUNTER — Encounter: Payer: Self-pay | Admitting: Nurse Practitioner

## 2020-10-09 ENCOUNTER — Non-Acute Institutional Stay: Payer: MEDICARE | Admitting: Nurse Practitioner

## 2020-10-09 DIAGNOSIS — K219 Gastro-esophageal reflux disease without esophagitis: Secondary | ICD-10-CM

## 2020-10-09 DIAGNOSIS — M25552 Pain in left hip: Secondary | ICD-10-CM | POA: Diagnosis not present

## 2020-10-09 DIAGNOSIS — I872 Venous insufficiency (chronic) (peripheral): Secondary | ICD-10-CM

## 2020-10-09 DIAGNOSIS — I10 Essential (primary) hypertension: Secondary | ICD-10-CM | POA: Diagnosis not present

## 2020-10-09 NOTE — Assessment & Plan Note (Signed)
persisted left hip/femur pain with movement or weight bearing, Tramadol, Tylenol are not effective. X-ray done a week ago showed no acute fx of the left hip. Will dc Tramadol. Adding Norco, Flexeril prn for pain. X-ray L hip/femur/pelvis to evaluate further. F/u Ortho

## 2020-10-09 NOTE — Assessment & Plan Note (Signed)
GERD, stable, on Omeprazole  

## 2020-10-09 NOTE — Progress Notes (Signed)
Location:   [facility] Room Number: [place] of Service:  ALF [phone]) Provider:  Marda Stalker, Lennie Odor NP  [name], MD  Patient Care Team: [name], MD as PCP - General (Internal Medicine) [name], MD as Consulting Physician (Orthopedic Surgery)  Extended Emergency Contact Information Primary Emergency Contact: cook,carolyn Mobile Phone: [phone] Relation: Friend Preferred language: English  Code Status:  Full Code Goals of care: Advanced Directive information Advanced Directives 08/29/2020  Does Patient Have a Medical Advance Directive? Yes  Type of Advance Directive Out of facility DNR (pink MOST or yellow form);Healthcare Power of Attorney  Does patient want to make changes to medical advance directive? No - Patient declined  Copy of [place] in Chart? -  Would patient like information on creating a medical advance directive? -  Pre-existing out of facility DNR order (yellow form or pink MOST form) Pink MOST form placed in chart (order not valid for inpatient use)     Chief Complaint  Patient presents with  . Acute Visit    Left hip pain    HPI:  Pt is a 86 y.o. female seen today for an acute visit for persisted left hip/femur pain with movement or weight bearing, Tramadol, Tylenol are not effective. X-ray done a week ago showed no acute fx of the left hip.   HTN, blood pressure is controlled, on amlodipine 5mg  qd, Metoprolol 37.5mg  qd.  PVD, peripheral edema, on Furosemide 40mg  qd.  GERD, stable, on Omeprazole.   Past Medical History:  Diagnosis Date  . Aortic heart murmur    AS/AI on 2D ECHO ; SBE prophylaxis Rxed  . Chest pain 2007 ; 2011   negative cardiac assessment  . Hyperlipidemia   . Hypertension [  . Interstitial cystitis    Resolved  . Ocular migraine 04/10/2012  . Osteoporosis    T score - 3.0 @ femoral neck; Fosamax affected swallowing  . Postmenopausal    No PMH or  HRT   Past Surgical History:  Procedure Laterality Date  . ANTERIOR APPROACH HEMI HIP ARTHROPLASTY Right 09/03/2019   Procedure: ANTERIOR APPROACH HEMI HIP ARTHROPLASTY BIPOLAR;  Surgeon: [name], MD;  Location: [facility];  Service: Orthopedics;  Laterality: Right;  . COLONOSCOPY  2011   Dr Sharlett Iles    Allergies  Allergen Reactions  . Amitid [Amitriptyline Hcl]     Patient experienced a dizzy, spinning feeling   . Metronidazole     REACTION: throat swells  . Penicillins     REACTION: rash & fever  . Alendronate Sodium     REACTION: unspecified  . Clarithromycin     GI intolerance  . Flonase [Fluticasone Propionate]     Slight Headache   . Promethazine Hcl     REACTION: unspecified  . Robitussin (Alcohol Free) [Guaifenesin] Other (See Comments)    unknown hyperactive  . Spironolactone     09/21/13 weakness in legs  . Singulair [Montelukast Sodium]     "spacey"; ineffective    Allergies as of 10/09/2020      Reactions   Amitid [amitriptyline Hcl]    Patient experienced a dizzy, spinning feeling    Metronidazole    REACTION: throat swells   Penicillins    REACTION: rash & fever   Alendronate Sodium    REACTION: unspecified   Clarithromycin    GI intolerance   Flonase [fluticasone Propionate]    Slight Headache    Promethazine Hcl    REACTION:  unspecified   Robitussin (alcohol Free) [guaifenesin] Other (See Comments)   unknown hyperactive   Spironolactone    09/21/13 weakness in legs   Singulair [montelukast Sodium]    "spacey"; ineffective      Medication List       Accurate as of October 09, 2020 11:59 PM. If you have any questions, ask your nurse or doctor.        acetaminophen 500 MG tablet Commonly known as: TYLENOL Take 1,000 mg by mouth every 6 (six) hours as needed for moderate pain.   acetaminophen 325 MG tablet Commonly known as: TYLENOL Take 650 mg by mouth every 6 (six) hours as needed.   amLODipine 5 MG tablet Commonly known as:  NORVASC TAKE 1 TABLET BY MOUTH EVERY DAY IN THE EVENING   aspirin EC 81 MG tablet Take 1 tablet (81 mg total) by mouth daily.   Biofreeze 4 % Gel Generic drug: Menthol (Topical Analgesic) Apply topically 2 (two) times daily as needed.   docusate sodium 100 MG capsule Commonly known as: COLACE Take 100 mg by mouth 2 (two) times daily as needed.   fexofenadine 180 MG tablet Commonly known as: ALLEGRA Take 180 mg by mouth as needed for allergies or rhinitis.   furosemide 40 MG tablet Commonly known as: LASIX Take 1 tablet (40 mg total) by mouth daily.   HYDROcodone-acetaminophen 5-325 MG tablet Commonly known as: NORCO/VICODIN Take 1 tablet by mouth every 6 (six) hours as needed for moderate pain.   lactose free nutrition Liqd Take 237 mLs by mouth daily.   metoprolol succinate 25 MG 24 hr tablet Commonly known as: TOPROL-XL TAKE 1.5 TABLETS (37.5 MG TOTAL) BY MOUTH DAILY.   multivitamin tablet Take 1 tablet by mouth daily. chewable   omeprazole 20 MG capsule Commonly known as: PRILOSEC Take 20 mg by mouth daily.   traMADol 50 MG tablet Commonly known as: ULTRAM Take 1 tablet (50 mg total) by mouth every 6 (six) hours as needed. What changed: when to take this       Review of Systems  Constitutional: Positive for activity change. Negative for appetite change and fever.  HENT: Positive for hearing loss. Negative for congestion, trouble swallowing and voice change.   Eyes: Negative for visual disturbance.  Respiratory: Negative for cough, shortness of breath and wheezing.   Cardiovascular: Positive for leg swelling.  Gastrointestinal: Negative for abdominal pain and constipation.       GERD  Genitourinary: Negative for difficulty urinating, dysuria and urgency.       Average nocturnal urination 2x/night.   Musculoskeletal: Positive for arthralgias and gait problem.       R hip pain is chronic, surgery about a year ago. Left hip/femur/groin pain with movement or  weight bearing.   Skin: Positive for rash. Negative for color change.  Neurological: Negative for speech difficulty, weakness, light-headedness and headaches.  Psychiatric/Behavioral: Negative for behavioral problems and sleep disturbance. The patient is not nervous/anxious.     Immunization History  Administered Date(s) Administered  . Fluad Quad(high Dose 65+) 10/05/2019  . Influenza Split 12/10/2011, 09/29/2012  . Influenza, High Dose Seasonal PF 09/21/2014, 08/27/2016, 08/27/2017, 09/23/2018  . Influenza,inj,Quad PF,6+ Mos 10/08/2013, 09/12/2015  . PFIZER SARS-COV-2 Vaccination 02/28/2020, 03/29/2020  . Pneumococcal Conjugate-13 08/22/2015  . Pneumococcal Polysaccharide-23 11/23/2008  . Td 03/23/2004  . Tdap 07/12/2015   Pertinent  Health Maintenance Due  Topic Date Due  . INFLUENZA VACCINE  07/30/2020  . DEXA SCAN  Completed  .  PNA vac Low Risk Adult  Completed   Fall Risk  09/23/2018 09/19/2017 09/04/2016 08/20/2016 12/20/2014  Falls in the past year? No No No No No  Comment - - Emmi Telephone Survey: data to providers prior to load - -   Functional Status Survey:    Vitals:   10/09/20 1455  BP: 120/64  Pulse: 71  Resp: 20  Temp: 97.9 F (36.6 C)  SpO2: 95%  Weight: 122 lb (55.3 kg)  Height: 5\' 4"  (1.626 m)   Body mass index is 20.94 kg/m. Physical Exam Vitals and nursing note reviewed.  Constitutional:      Appearance: Normal appearance.  HENT:     Head: Normocephalic and atraumatic.     Mouth/Throat:     Mouth: Mucous membranes are moist.  Eyes:     Extraocular Movements: Extraocular movements intact.     Conjunctiva/sclera: Conjunctivae normal.     Pupils: Pupils are equal, round, and reactive to light.  Cardiovascular:     Rate and Rhythm: Normal rate and regular rhythm.     Heart sounds: No murmur heard.   Pulmonary:     Effort: Pulmonary effort is normal.     Breath sounds: No rales.  Abdominal:     General: Bowel sounds are normal.      Palpations: Abdomen is soft.     Tenderness: There is no abdominal tenderness.  Musculoskeletal:        General: Tenderness present. No swelling, deformity or signs of injury.     Cervical back: Normal range of motion and neck supple.     Right lower leg: Edema present.     Left lower leg: Edema present.     Comments: Trace edema BLE. Left hip/fumer/groin pain with movement, weight bearing.   Skin:    General: Skin is warm and dry.     Findings: Rash present.     Comments: Left shin itching is improved.   Neurological:     General: No focal deficit present.     Mental Status: She is alert and oriented to person, place, and time. Mental status is at baseline.     Motor: No weakness.     Coordination: Coordination normal.     Gait: Gait abnormal.  Psychiatric:        Mood and Affect: Mood normal.        Behavior: Behavior normal.        Thought Content: Thought content normal.        Judgment: Judgment normal.     Labs reviewed: Recent Labs    11/03/19 1411 11/03/19 1411 02/15/20 1448 02/15/20 1448 02/23/20 1540 05/11/20 0000 09/01/20 0000  NA 137  --  136   < > 139 141 141  K 3.9   < > 5.3*   < > 4.7 3.9 4.1  CL 97   < > 95*   < > 98 103 104  CO2 35*   < > 29   < > 26 33* 28*  GLUCOSE 94  --  86  --  80  --   --   BUN 22  --  19   < > 15 10 13   CREATININE 0.61   < > 0.58   < > 0.64 0.5 0.5  CALCIUM 9.5   < > 9.9   < > 9.7 9.3 9.6   < > = values in this interval not displayed.   Recent Labs    05/11/20 0000 09/01/20 0000  AST 10* 11*  ALT 10 11  ALKPHOS 72 135*  ALBUMIN 3.7 3.9   Recent Labs    11/03/19 1411 05/11/20 0000 09/01/20 0000  WBC 6.3 5.7 6.8  NEUTROABS 4.7 3,523 4,481  HGB 11.3* 12.2 12.3  HCT 33.8* 36 37  MCV 93.4  --   --   PLT 249.0 239 322   Lab Results  Component Value Date   TSH 1.90 05/11/2020   Lab Results  Component Value Date   HGBA1C 5.5 07/12/2015   Lab Results  Component Value Date   CHOL 227 (H) 06/24/2013   HDL  89.10 06/24/2013   LDLDIRECT 114.7 06/24/2013   TRIG 80.0 06/24/2013   CHOLHDL 3 06/24/2013    Significant Diagnostic Results in last 30 days:  DG Bone Density  Result Date: 09/25/2020 EXAM: DUAL X-RAY ABSORPTIOMETRY (DXA) FOR BONE MINERAL DENSITY IMPRESSION: Referring Physician:  [name] Your patient completed a BMD test using Lunar IDXA DXA system ( analysis version: 16 ) manufactured by [employer]. Technologist: AW PATIENT: Name: Rosaleigh, Brazzel Patient ID: [id number] Birth Date: [birth year] Height: 64.0 in. Sex: Female Measured: 09/25/2020 Weight: 125.4 lbs. Indications: Advanced Age, Caucasian, Estrogen Deficient, Omeprazole, Osteoporosis (733), Postmenopausal Fractures: Right hip Treatments: None ASSESSMENT: The BMD measured at Femur Total is 0.428 g/cm2 with a T-score of -4.6. This patient is considered osteoporotic according to [facility]) criteria. The scan quality is good. L-4 was excluded due to degenerative changes. Right femur was excluded due to surgical hardware. Site Region Measured Date Measured Age YA BMD Significant CHANGE T-score AP Spine   L1-L3  11/25/[age]    87.11         -4.5    0.625 g/cm2 Left Femur Neck   09[age]25/2020    87.9         -3.6    0.543 g/cm2 Left Femur Total  09[age]25/2020    87.9         -4.6    0.428 g/cm2 World Health Organization [facility]) criteria for post-menopausal, Caucasian Women: Normal       T-score at or above -1 SD Osteopenia   T-score between -1 and -2.5 SD Osteoporosis T-score at or below -2.5 SD RECOMMENDATION: 1. All patients should optimize calcium and vitamin D intake. 2. Consider FDA approved medical therapies in postmenopausal women and men aged 18 years and older, based on the following: a. A hip or vertebral (clinical or morphometric) fracture b. T- score < or = -2.5 at the femoral neck or spine after appropriate evaluation to exclude secondary causes c. Low bone mass (T-score between -1.0 and -2.5 at the femoral neck or  spine) and a 10 year probability of a hip fracture > or = 3% or a 10 year probability of a major osteoporosis-related fracture > or = 20% based on the [place]-adapted WHO algorithm d. Clinician judgment and/or patient preferences may indicate treatment for people with 10-year fracture probabilities above or below these levels FOLLOW-UP: Patients with diagnosis of osteoporosis or at high risk for fracture should have regular bone mineral density tests. For patients eligible for Medicare, routine testing is allowed once every 2 years. The testing frequency can be increased to one year for patients who have rapidly progressing disease, those who are receiving or discontinuing medical therapy to restore bone mass, or have additional risk factors. I have reviewed this report and agree with the above findings. [facility] Electronically Signed   By: Lowella Grip III M.D.  On: 09/25/2020 11:28    Assessment/Plan Left hip pain  persisted left hip/femur pain with movement or weight bearing, Tramadol, Tylenol are not effective. X-ray done a week ago showed no acute fx of the left hip. Will dc Tramadol. Adding Norco, Flexeril prn for pain. X-ray L hip/femur/pelvis to evaluate further. F/u Ortho   Essential hypertension HTN, blood pressure is controlled, on amlodipine 5mg  qd, Metoprolol 37.5mg  qd.    Venous (peripheral) insufficiency PVD, peripheral edema, on Furosemide 40mg  qd.  ESOPHAGEAL REFLUX GERD, stable, on Omeprazole.      Family/ staff Communication: plan of care reviewed with the patient and charge nurse.   Labs/tests ordered:  X-ray pelvis/left hip 3 views, left femur  2 views.

## 2020-10-09 NOTE — Assessment & Plan Note (Signed)
HTN, blood pressure is controlled, on amlodipine 5mg qd, Metoprolol 37.5mg qd.   

## 2020-10-09 NOTE — Assessment & Plan Note (Signed)
PVD, peripheral edema, on Furosemide 40mg  qd.

## 2020-10-10 ENCOUNTER — Other Ambulatory Visit: Payer: Self-pay | Admitting: Orthopedic Surgery

## 2020-10-10 ENCOUNTER — Inpatient Hospital Stay (HOSPITAL_COMMUNITY)
Admission: AD | Admit: 2020-10-10 | Discharge: 2020-10-13 | DRG: 522 | Disposition: A | Discharge status: Skilled Nursing Facility | Payer: MEDICARE | Source: Skilled Nursing Facility | Attending: Family Medicine | Admitting: Family Medicine

## 2020-10-10 ENCOUNTER — Inpatient Hospital Stay (HOSPITAL_COMMUNITY): Payer: MEDICARE

## 2020-10-10 ENCOUNTER — Other Ambulatory Visit: Payer: Self-pay

## 2020-10-10 ENCOUNTER — Encounter: Payer: Self-pay | Admitting: Nurse Practitioner

## 2020-10-10 ENCOUNTER — Encounter (HOSPITAL_COMMUNITY): Payer: Self-pay | Admitting: Orthopedic Surgery

## 2020-10-10 ENCOUNTER — Encounter (HOSPITAL_COMMUNITY): Payer: Self-pay | Admitting: Internal Medicine

## 2020-10-10 DIAGNOSIS — S72002D Fracture of unspecified part of neck of left femur, subsequent encounter for closed fracture with routine healing: Secondary | ICD-10-CM | POA: Diagnosis not present

## 2020-10-10 DIAGNOSIS — S72002A Fracture of unspecified part of neck of left femur, initial encounter for closed fracture: Secondary | ICD-10-CM | POA: Diagnosis not present

## 2020-10-10 DIAGNOSIS — K219 Gastro-esophageal reflux disease without esophagitis: Secondary | ICD-10-CM | POA: Diagnosis not present

## 2020-10-10 DIAGNOSIS — I872 Venous insufficiency (chronic) (peripheral): Secondary | ICD-10-CM | POA: Diagnosis not present

## 2020-10-10 DIAGNOSIS — Z888 Allergy status to other drugs, medicaments and biological substances status: Secondary | ICD-10-CM

## 2020-10-10 DIAGNOSIS — I5030 Unspecified diastolic (congestive) heart failure: Secondary | ICD-10-CM | POA: Diagnosis not present

## 2020-10-10 DIAGNOSIS — M810 Age-related osteoporosis without current pathological fracture: Secondary | ICD-10-CM | POA: Diagnosis not present

## 2020-10-10 DIAGNOSIS — J90 Pleural effusion, not elsewhere classified: Secondary | ICD-10-CM | POA: Diagnosis not present

## 2020-10-10 DIAGNOSIS — Z515 Encounter for palliative care: Secondary | ICD-10-CM

## 2020-10-10 DIAGNOSIS — Z79899 Other long term (current) drug therapy: Secondary | ICD-10-CM

## 2020-10-10 DIAGNOSIS — Z20822 Contact with and (suspected) exposure to covid-19: Secondary | ICD-10-CM | POA: Diagnosis present

## 2020-10-10 DIAGNOSIS — Z419 Encounter for procedure for purposes other than remedying health state, unspecified: Secondary | ICD-10-CM

## 2020-10-10 DIAGNOSIS — Z66 Do not resuscitate: Secondary | ICD-10-CM | POA: Diagnosis present

## 2020-10-10 DIAGNOSIS — L03116 Cellulitis of left lower limb: Secondary | ICD-10-CM | POA: Diagnosis not present

## 2020-10-10 DIAGNOSIS — J3089 Other allergic rhinitis: Secondary | ICD-10-CM | POA: Diagnosis not present

## 2020-10-10 DIAGNOSIS — Z96649 Presence of unspecified artificial hip joint: Secondary | ICD-10-CM

## 2020-10-10 DIAGNOSIS — Z7982 Long term (current) use of aspirin: Secondary | ICD-10-CM

## 2020-10-10 DIAGNOSIS — E785 Hyperlipidemia, unspecified: Secondary | ICD-10-CM | POA: Diagnosis not present

## 2020-10-10 DIAGNOSIS — R2681 Unsteadiness on feet: Secondary | ICD-10-CM | POA: Diagnosis not present

## 2020-10-10 DIAGNOSIS — Z96642 Presence of left artificial hip joint: Secondary | ICD-10-CM | POA: Diagnosis not present

## 2020-10-10 DIAGNOSIS — M2550 Pain in unspecified joint: Secondary | ICD-10-CM | POA: Diagnosis not present

## 2020-10-10 DIAGNOSIS — Z01811 Encounter for preprocedural respiratory examination: Secondary | ICD-10-CM

## 2020-10-10 DIAGNOSIS — Z01818 Encounter for other preprocedural examination: Secondary | ICD-10-CM | POA: Diagnosis not present

## 2020-10-10 DIAGNOSIS — Z881 Allergy status to other antibiotic agents status: Secondary | ICD-10-CM

## 2020-10-10 DIAGNOSIS — Z8262 Family history of osteoporosis: Secondary | ICD-10-CM | POA: Diagnosis not present

## 2020-10-10 DIAGNOSIS — E876 Hypokalemia: Secondary | ICD-10-CM | POA: Diagnosis not present

## 2020-10-10 DIAGNOSIS — Z7401 Bed confinement status: Secondary | ICD-10-CM | POA: Diagnosis not present

## 2020-10-10 DIAGNOSIS — Z880 Allergy status to penicillin: Secondary | ICD-10-CM | POA: Diagnosis not present

## 2020-10-10 DIAGNOSIS — D649 Anemia, unspecified: Secondary | ICD-10-CM | POA: Diagnosis not present

## 2020-10-10 DIAGNOSIS — M79671 Pain in right foot: Secondary | ICD-10-CM | POA: Diagnosis not present

## 2020-10-10 DIAGNOSIS — R1314 Dysphagia, pharyngoesophageal phase: Secondary | ICD-10-CM | POA: Diagnosis not present

## 2020-10-10 DIAGNOSIS — Z471 Aftercare following joint replacement surgery: Secondary | ICD-10-CM | POA: Diagnosis not present

## 2020-10-10 DIAGNOSIS — I739 Peripheral vascular disease, unspecified: Secondary | ICD-10-CM | POA: Diagnosis present

## 2020-10-10 DIAGNOSIS — I10 Essential (primary) hypertension: Secondary | ICD-10-CM | POA: Diagnosis not present

## 2020-10-10 DIAGNOSIS — R4181 Age-related cognitive decline: Secondary | ICD-10-CM | POA: Diagnosis not present

## 2020-10-10 DIAGNOSIS — Z7189 Other specified counseling: Secondary | ICD-10-CM | POA: Diagnosis not present

## 2020-10-10 DIAGNOSIS — Z8249 Family history of ischemic heart disease and other diseases of the circulatory system: Secondary | ICD-10-CM

## 2020-10-10 DIAGNOSIS — R29898 Other symptoms and signs involving the musculoskeletal system: Secondary | ICD-10-CM | POA: Diagnosis not present

## 2020-10-10 DIAGNOSIS — W19XXXA Unspecified fall, initial encounter: Secondary | ICD-10-CM | POA: Diagnosis not present

## 2020-10-10 DIAGNOSIS — M25552 Pain in left hip: Secondary | ICD-10-CM | POA: Diagnosis not present

## 2020-10-10 DIAGNOSIS — K5909 Other constipation: Secondary | ICD-10-CM | POA: Diagnosis not present

## 2020-10-10 DIAGNOSIS — R52 Pain, unspecified: Secondary | ICD-10-CM | POA: Diagnosis not present

## 2020-10-10 DIAGNOSIS — M6281 Muscle weakness (generalized): Secondary | ICD-10-CM | POA: Diagnosis not present

## 2020-10-10 DIAGNOSIS — M80852A Other osteoporosis with current pathological fracture, left femur, initial encounter for fracture: Secondary | ICD-10-CM | POA: Diagnosis not present

## 2020-10-10 DIAGNOSIS — S72042A Displaced fracture of base of neck of left femur, initial encounter for closed fracture: Secondary | ICD-10-CM | POA: Diagnosis not present

## 2020-10-10 LAB — CBC WITH DIFFERENTIAL/PLATELET
Abs Immature Granulocytes: 0.03 10*3/uL (ref 0.00–0.07)
Basophils Absolute: 0 10*3/uL (ref 0.0–0.1)
Basophils Relative: 0 %
Eosinophils Absolute: 0 10*3/uL (ref 0.0–0.5)
Eosinophils Relative: 0 %
HCT: 40.9 % (ref 36.0–46.0)
Hemoglobin: 13.3 g/dL (ref 12.0–15.0)
Immature Granulocytes: 0 %
Lymphocytes Relative: 11 %
Lymphs Abs: 1 10*3/uL (ref 0.7–4.0)
MCH: 30.9 pg (ref 26.0–34.0)
MCHC: 32.5 g/dL (ref 30.0–36.0)
MCV: 94.9 fL (ref 80.0–100.0)
Monocytes Absolute: 0.5 10*3/uL (ref 0.1–1.0)
Monocytes Relative: 6 %
Neutro Abs: 7.4 10*3/uL (ref 1.7–7.7)
Neutrophils Relative %: 83 %
Platelets: 353 10*3/uL (ref 150–400)
RBC: 4.31 MIL/uL (ref 3.87–5.11)
RDW: 12.6 % (ref 11.5–15.5)
WBC: 8.9 10*3/uL (ref 4.0–10.5)
nRBC: 0 % (ref 0.0–0.2)

## 2020-10-10 LAB — COMPREHENSIVE METABOLIC PANEL
ALT: 16 U/L (ref 0–44)
AST: 14 U/L — ABNORMAL LOW (ref 15–41)
Albumin: 4 g/dL (ref 3.5–5.0)
Alkaline Phosphatase: 114 U/L (ref 38–126)
Anion gap: 10 (ref 5–15)
BUN: 16 mg/dL (ref 8–23)
CO2: 28 mmol/L (ref 22–32)
Calcium: 9.6 mg/dL (ref 8.9–10.3)
Chloride: 98 mmol/L (ref 98–111)
Creatinine, Ser: 0.46 mg/dL (ref 0.44–1.00)
GFR, Estimated: >60 mL/min (ref >60)
Glucose, Bld: 103 mg/dL — ABNORMAL HIGH (ref 70–99)
Potassium: 3.4 mmol/L — ABNORMAL LOW (ref 3.5–5.1)
Sodium: 136 mmol/L (ref 135–145)
Total Bilirubin: 0.6 mg/dL (ref 0.3–1.2)
Total Protein: 6.5 g/dL (ref 6.5–8.1)

## 2020-10-10 LAB — RESPIRATORY PANEL BY RT PCR (FLU A&B, COVID)
Influenza A by PCR: NEGATIVE
Influenza B by PCR: NEGATIVE
SARS Coronavirus 2 by RT PCR: NEGATIVE

## 2020-10-10 LAB — PROTIME-INR
INR: 1 (ref 0.8–1.2)
Prothrombin Time: 12.9 seconds (ref 11.4–15.2)

## 2020-10-10 LAB — TYPE AND SCREEN
ABO/RH(D): A POS
Antibody Screen: NEGATIVE

## 2020-10-10 MED ORDER — MORPHINE SULFATE (PF) 2 MG/ML IV SOLN
0.5000 mg | INTRAVENOUS | Status: DC | PRN
  Administered 2020-10-10 – 2020-10-13 (×3): 0.5 mg via INTRAVENOUS
  Filled 2020-10-10 (×3): qty 1

## 2020-10-10 MED ORDER — ENOXAPARIN SODIUM 40 MG/0.4ML ~~LOC~~ SOLN: 40.0000 mg | SUBCUTANEOUS | Status: DC

## 2020-10-10 MED ORDER — POLYETHYLENE GLYCOL 3350 17 G PO PACK: 17.0000 g | PACK | Freq: Every day | ORAL | Status: DC | PRN

## 2020-10-10 MED ORDER — ACETAMINOPHEN 325 MG PO TABS: 650.0000 mg | ORAL_TABLET | Freq: Four times a day (QID) | ORAL | Status: DC | PRN

## 2020-10-10 MED ORDER — BOOST PLUS PO LIQD
237.0000 mL | Freq: Every day | ORAL | Status: DC
  Administered 2020-10-12 – 2020-10-13 (×2): 237 mL via ORAL
  Filled 2020-10-10 (×3): qty 237

## 2020-10-10 MED ORDER — PANTOPRAZOLE SODIUM 40 MG PO TBEC
40.0000 mg | DELAYED_RELEASE_TABLET | Freq: Every day | ORAL | Status: DC
  Administered 2020-10-13: 40 mg via ORAL
  Filled 2020-10-10: qty 1

## 2020-10-10 MED ORDER — METOPROLOL SUCCINATE ER 25 MG PO TB24
37.5000 mg | ORAL_TABLET | Freq: Every day | ORAL | Status: DC
  Administered 2020-10-11 – 2020-10-13 (×3): 37.5 mg via ORAL
  Filled 2020-10-10 (×3): qty 2

## 2020-10-10 MED ORDER — ACETAMINOPHEN 325 MG PO TABS
650.0000 mg | ORAL_TABLET | Freq: Four times a day (QID) | ORAL | Status: DC | PRN
  Administered 2020-10-10: 650 mg via ORAL
  Filled 2020-10-10: qty 2

## 2020-10-10 MED ORDER — HYDROCODONE-ACETAMINOPHEN 5-325 MG PO TABS
1.0000 | ORAL_TABLET | Freq: Four times a day (QID) | ORAL | Status: DC | PRN
  Administered 2020-10-10 – 2020-10-12 (×3): 1 via ORAL
  Filled 2020-10-10 (×3): qty 1

## 2020-10-10 MED ORDER — AMLODIPINE BESYLATE 5 MG PO TABS
5.0000 mg | ORAL_TABLET | Freq: Every day | ORAL | Status: DC
  Administered 2020-10-13: 5 mg via ORAL
  Filled 2020-10-10 (×2): qty 1

## 2020-10-10 MED ORDER — FUROSEMIDE 40 MG PO TABS
40.0000 mg | ORAL_TABLET | Freq: Every day | ORAL | Status: DC
  Administered 2020-10-10 – 2020-10-13 (×2): 40 mg via ORAL
  Filled 2020-10-10 (×2): qty 1

## 2020-10-10 MED ORDER — DOCUSATE SODIUM 100 MG PO CAPS
100.0000 mg | ORAL_CAPSULE | Freq: Two times a day (BID) | ORAL | Status: DC
  Administered 2020-10-10 – 2020-10-13 (×5): 100 mg via ORAL
  Filled 2020-10-10 (×5): qty 1

## 2020-10-10 NOTE — H&P (Addendum)
History and Physical    [name]:[id number] DOB: [birth year] DOA: 10/10/2020  PCP: [name], MD  Patient coming from: Orthopedic Surgery clinic as a direct admit, resides at [facility]  Chief Complaint: L hip pain  HPI: Alicia Boyd is a 86 y.o. female with medical history significant of osteoporosis, prior R femoral neck fracture, HTN who presents as a direct admit from Orthopedic Surgery clinic. Patient reports that 3 weeks ago, she noted a sudden "pop" involving her L hip while ambulating down the hallway with her friend at facility, resulting in joint pains on ambulation and movement. Pt proceeded to continue her daily living and ambulation to not be a burden on others. Initial xray was found be be neg for acute fracture. Patient was tried on multiple analgesics without much success. Pt was later referred to Orthopedic Surgery office. Records are not available at this time, however, per pt, repeat xray was done with finding of L femoral neck fracture. Pt was subsequently directly admitted to [facility] where surgery was planned for the afternoon of 10/13. Pt currently denies chest pain or sob. No fevers or chills  Review of Systems:  Review of Systems  Constitutional: Negative for chills, fever and malaise/fatigue.  HENT: Negative for ear discharge, ear pain and nosebleeds.   Eyes: Negative for double vision, photophobia and pain.  Respiratory: Negative for hemoptysis, sputum production and shortness of breath.   Cardiovascular: Negative for palpitations, orthopnea and claudication.  Gastrointestinal: Negative for abdominal pain, nausea and vomiting.  Genitourinary: Negative for frequency, hematuria and urgency.  Musculoskeletal: Positive for joint pain. Negative for back pain and neck pain.  Neurological: Negative for tremors, sensory change, speech change and loss of consciousness.  Psychiatric/Behavioral: Negative for hallucinations and memory loss. The patient is not  nervous/anxious.     Past Medical History:  Diagnosis Date  . Aortic heart murmur    AS/AI on 2D ECHO ; SBE prophylaxis Rxed  . Chest pain 2007 ; 2011   negative cardiac assessment  . GERD (gastroesophageal reflux disease)   . Hyperlipidemia   . Hypertension [  . Interstitial cystitis    Resolved  . Ocular migraine 04/10/2012  . Osteoporosis    T score - 3.0 @ femoral neck; Fosamax affected swallowing  . Peripheral vascular disease ()   . Postmenopausal    No PMH or HRT    Past Surgical History:  Procedure Laterality Date  . ANTERIOR APPROACH HEMI HIP ARTHROPLASTY Right 09/03/2019   Procedure: ANTERIOR APPROACH HEMI HIP ARTHROPLASTY BIPOLAR;  Surgeon: [name], MD;  Location: [facility];  Service: Orthopedics;  Laterality: Right;  . COLONOSCOPY  2011   Dr Sharlett Iles     reports that she has never smoked. She has never used smokeless tobacco. She reports that she does not drink alcohol and does not use drugs.  Allergies  Allergen Reactions  . Amitid [Amitriptyline Hcl]     Patient experienced a dizzy, spinning feeling   . Metronidazole     REACTION: throat swells  . Penicillins     REACTION: rash & fever  . Alendronate Sodium     REACTION: unspecified  . Amoxicillin Nausea And Vomiting  . Clarithromycin     GI intolerance  . Flonase [Fluticasone Propionate]     Slight Headache   . Promethazine Hcl     REACTION: unspecified  . Robitussin (Alcohol Free) [Guaifenesin] Other (See Comments)    unknown hyperactive  . Spironolactone  09/21/13 weakness in legs  . Singulair [Montelukast Sodium]     "spacey"; ineffective    Family History  Problem Relation Age of Onset  . Diabetes Father        Pancreatitic insufficiency post MVA  . Hypertension Mother   . Osteoporosis Mother   . Heart disease Mother        AF  . Thyroid disease Sister        hypothyroidism  . Coronary artery disease Sister        AF  . Stroke Sister 25       also pulmonary disease     Prior to Admission medications   Medication Sig Start Date End Date Taking? Authorizing Provider  acetaminophen (TYLENOL) 325 MG tablet Take 650 mg by mouth every 6 (six) hours as needed.    [provider]  acetaminophen (TYLENOL) 500 MG tablet Take 1,000 mg by mouth every 6 (six) hours as needed for moderate pain.    [provider]  amLODipine (NORVASC) 5 MG tablet TAKE 1 TABLET BY MOUTH EVERY DAY IN THE EVENING 07/12/20   Nafziger, [name], NP  aspirin EC 81 MG tablet Take 1 tablet (81 mg total) by mouth daily. 11/15/19   Lelon Perla, MD  docusate sodium (COLACE) 100 MG capsule Take 100 mg by mouth 2 (two) times daily as needed.     [provider]  fexofenadine (ALLEGRA) 180 MG tablet Take 180 mg by mouth as needed for allergies or rhinitis.    [provider]  furosemide (LASIX) 40 MG tablet Take 1 tablet (40 mg total) by mouth daily. 03/28/20   Nafziger, [name], NP  HYDROcodone-acetaminophen (NORCO/VICODIN) 5-325 MG tablet Take 1 tablet by mouth every 6 (six) hours as needed for moderate pain.    [provider]  lactose free nutrition (BOOST PLUS) LIQD Take 237 mLs by mouth daily.    [provider]  Menthol, Topical Analgesic, (BIOFREEZE) 4 % GEL Apply topically 2 (two) times daily as needed.    [provider]  metoprolol succinate (TOPROL-XL) 25 MG 24 hr tablet TAKE 1.5 TABLETS (37.5 MG TOTAL) BY MOUTH DAILY. 05/17/20   Nafziger, [name], NP  Multiple Vitamin (MULTIVITAMIN) tablet Take 1 tablet by mouth daily. chewable    [provider]  omeprazole (PRILOSEC) 20 MG capsule Take 20 mg by mouth daily.    [provider]  traMADol (ULTRAM) 50 MG tablet Take 1 tablet (50 mg total) by mouth every 6 (six) hours as needed. Patient taking differently: Take 50 mg by mouth 4 (four) times daily.  10/06/20   Mast, Man X, NP    Physical Exam: Vitals:   10/10/20 1700  BP: (!) 166/79  Pulse: 74  Resp: 18  Temp:  97.9 F (36.6 C)  SpO2: 100%    Constitutional: NAD, calm, comfortable Vitals:   10/10/20 1700  BP: (!) 166/79  Pulse: 74  Resp: 18  Temp: 97.9 F (36.6 C)  SpO2: 100%   Eyes: PERRL, lids and conjunctivae normal ENMT: Mucous membranes are moist, dentition fair Neck: normal, supple, no masses, trachea midline Respiratory: clear to auscultation bilaterally, no wheezing, no crackles. Normal respiratory effort. No accessory muscle use.  Cardiovascular: Regular rate and rhythm, s1, s2 Abdomen: no tenderness, no masses palpated. No hepatosplenomegaly. Bowel sounds positive.  Musculoskeletal: no clubbing / cyanosis. No joint deformity upper and lower extremities. Good ROM, no contractures. Normal muscle tone. LLE tenderness from hip down Skin: no rashes, lesions,No  induration Neurologic: CN 2-12 grossly intact. Sensation intact,  Strength 5/5 in all 4.  Psychiatric: Normal judgment and insight. Alert and oriented x 3. Normal mood.    Labs on Admission: I have personally reviewed following labs and imaging studies  CBC: No results for input(s): WBC, NEUTROABS, HGB, HCT, MCV, PLT in the last 168 hours. Basic Metabolic Panel: No results for input(s): NA, K, CL, CO2, GLUCOSE, BUN, CREATININE, CALCIUM, MG, PHOS in the last 168 hours. GFR: CrCl cannot be calculated (Patient's most recent lab result is older than the maximum 21 days allowed.). Liver Function Tests: No results for input(s): AST, ALT, ALKPHOS, BILITOT, PROT, ALBUMIN in the last 168 hours. No results for input(s): LIPASE, AMYLASE in the last 168 hours. No results for input(s): AMMONIA in the last 168 hours. Coagulation Profile: No results for input(s): INR, PROTIME in the last 168 hours. Cardiac Enzymes: No results for input(s): CKTOTAL, CKMB, CKMBINDEX, TROPONINI in the last 168 hours. BNP (last 3 results) No results for input(s): PROBNP in the last 8760 hours. HbA1C: No results for input(s): HGBA1C in the last 72  hours. CBG: No results for input(s): GLUCAP in the last 168 hours. Lipid Profile: No results for input(s): CHOL, HDL, LDLCALC, TRIG, CHOLHDL, LDLDIRECT in the last 72 hours. Thyroid Function Tests: No results for input(s): TSH, T4TOTAL, FREET4, T3FREE, THYROIDAB in the last 72 hours. Anemia Panel: No results for input(s): VITAMINB12, FOLATE, FERRITIN, TIBC, IRON, RETICCTPCT in the last 72 hours. Urine analysis:    Component Value Date/Time   COLORURINE STRAW (A) 10/03/2019 1858   APPEARANCEUR CLEAR 10/03/2019 1858   LABSPEC 1.006 10/03/2019 1858   PHURINE 7.0 10/03/2019 1858   GLUCOSEU NEGATIVE 10/03/2019 1858   HGBUR NEGATIVE 10/03/2019 1858   HGBUR negative 01/25/2010 1147   [place] 10/03/2019 1858   BILIRUBINUR neg 03/19/2018 1433   KETONESUR NEGATIVE 10/03/2019 1858   PROTEINUR NEGATIVE 10/03/2019 1858   UROBILINOGEN 0.2 03/19/2018 1433   UROBILINOGEN 0.2 01/25/2010 1147   NITRITE NEGATIVE 10/03/2019 1858   LEUKOCYTESUR NEGATIVE 10/03/2019 1858   Sepsis Labs: !!!!!!!!!!!!!!!!!!!!!!!!!!!!!!!!!!!!!!!!!!!! @LABRCNTIP (procalcitonin:4,lacticidven:4) )No results found for this or any previous visit (from the past 240 hour(s)).   Radiological Exams on Admission: No results found.  EKG: Independently reviewed. Ordered, pending  Assessment/Plan Principal Problem:   Displaced fracture of left femoral neck (HCC) Active Problems:   Essential hypertension   Esophageal reflux   Left displaced femoral neck fracture (HCC)   DNR (do not resuscitate) discussion  1. Left femoral neck fracture 1. Presented from Orthopedic Surgery office as direct admit for L femoral neck fracture, imaging reported done at Orthopedic Surgery office 2. Pt noted to have acute L hip pain on movement or wt bearing, limited improvement with analgesics 3. Will continue analgesics as needed 4. Orthopedic Surgery to follow, per chart, pt has been scheduled for surgery 10/13 5. Will make NPO  after midnight 6. Will check baseline CMP and CBC 7. Will check pre-operative CXR and EKG 8. Lungs are clear on exam, heart regular, vital signs stable. Pt denies chest pain or sob. Pending CXR and EKG, if both unremarkable, then benefit to surgery would outweigh risk 2. HTN 1. BP stable at present 2. Would continue metoprolol, norvasc, lasix per home regimen 3. Reflux 1. Would continue PPI  2. Seems to be stable at this time 4. Peripheral venous insufficiency 1. Appears stable 2. Recommend LE elevation at rest 5. Osteoporosis 1. Outpt BMD reviewed. Pt noted to have T score of -  4.6 on 9/21 2. Chart reviewed. Pt had not tolerated Fosamax secondary to hx of dysphagia in the past 3. Pt was planned to start Prolia, however pt is in the process of deciding if she wants to pursue regimen or not  DVT prophylaxis: Lovenox subQ  Code Status: DNR Family Communication: Pt in room, family not at bedside  Disposition Plan: Uncertain at this time  Consults called: Orthopedic Surgery Admission status: Inpatient, as pt would likely require greater than 2 midnight stay to manage and address acute femoral fracture  Marylu Lund MD [facility] Pager On Amion  If 7PM-7AM, please contact night-coverage  10/10/2020, 5:54 PM

## 2020-10-10 NOTE — Progress Notes (Signed)
COVID Vaccine Completed: Date COVID Vaccine completed: COVID vaccine manufacturer: [employer]   PCP - Dr. Veleta Miners, Man Mast NP last office visit 10/09/20 in epic Cardiologist -   Chest x-ray - 11/04/2019 in epic EKG - greater than 1 year Stress Test -  ECHO - 10/04/2019 in epic Cardiac Cath -  Pacemaker/ICD device last checked:  Sleep Study -  CPAP -   Fasting Blood Sugar -  Checks Blood Sugar _____ times a day  Blood Thinner Instructions: Aspirin Instructions: Last Dose:  Anesthesia review: CHF, HTN  Patient denies shortness of breath, fever, cough and chest pain at PAT appointment   Patient verbalized understanding of instructions that were given to them at the PAT appointment. Patient was also instructed that they will need to review over the PAT instructions again at home before surgery.

## 2020-10-10 NOTE — Progress Notes (Signed)
CSW received a call from pt's RN on call at [facility] asking that the RN CM/CSW please contact [place] to provided updates at ph: [phone].  2nd shift ED CSW will leave handoff for 1st shift CSW/RN CM  CSW will continue to follow for D/C needs.  Alphonse Guild. Theola Cuellar  MSW, LCSW, LCAS, CCS Transitions of Care Clinical Social Worker Care Coordination Department Ph: [phone]

## 2020-10-10 NOTE — Consult Note (Signed)
Reason for Consult:left femoral neck fracture Referring Physician: hospitalists  Alicia Boyd is an 86 y.o. female.  HPI: the patient is an 86 year old female who is well known to my practice.  She had a right femoral neck fracture couple of years ago.  She was treated with a bipolar hemiarthroplasty from the front and did well.  She's had significant osteoporosis and unfortunately has suffered a left femoral neck fracture.  She's been admitted by the hospitalist service and cleared for surgery.  We are consult for management of her femoral neck fracture.  Past Medical History:  Diagnosis Date  . Aortic heart murmur    AS/AI on 2D ECHO ; SBE prophylaxis Rxed  . Chest pain 2007 ; 2011   negative cardiac assessment  . GERD (gastroesophageal reflux disease)   . Hyperlipidemia   . Hypertension [  . Interstitial cystitis    Resolved  . Ocular migraine 04/10/2012  . Osteoporosis    T score - 3.0 @ femoral neck; Fosamax affected swallowing  . Peripheral vascular disease ([place])   . Postmenopausal    No PMH or HRT    Past Surgical History:  Procedure Laterality Date  . ANTERIOR APPROACH HEMI HIP ARTHROPLASTY Right 09/03/2019   Procedure: ANTERIOR APPROACH HEMI HIP ARTHROPLASTY BIPOLAR;  Surgeon: [name], MD;  Location: [facility];  Service: Orthopedics;  Laterality: Right;  . COLONOSCOPY  2011   Dr Sharlett Iles    Family History  Problem Relation Age of Onset  . Diabetes Father        Pancreatitic insufficiency post MVA  . Hypertension Mother   . Osteoporosis Mother   . Heart disease Mother        AF  . Thyroid disease Sister        hypothyroidism  . Coronary artery disease Sister        AF  . Stroke Sister 57       also pulmonary disease    Social History:  reports that she has never smoked. She has never used smokeless tobacco. She reports that she does not drink alcohol and does not use drugs.  Allergies:  Allergies  Allergen Reactions  . Amitid [Amitriptyline Hcl]      Patient experienced a dizzy, spinning feeling   . Metronidazole     REACTION: throat swells  . Penicillins     REACTION: rash & fever  . Alendronate Sodium     REACTION: unspecified  . Amoxicillin Nausea And Vomiting  . Clarithromycin     GI intolerance  . Flonase [Fluticasone Propionate]     Slight Headache   . Promethazine Hcl     REACTION: unspecified  . Robitussin (Alcohol Free) [Guaifenesin] Other (See Comments)    unknown hyperactive  . Spironolactone     09/21/13 weakness in legs  . Singulair [Montelukast Sodium]     "spacey"; ineffective    Medications: I have reviewed the patient's current medications.  No results found for this or any previous visit (from the past 48 hour(s)).  No results found.  ROS  ROS: I have reviewed the patient's review of systems thoroughly and there are no positive responses as relates to the HPI. Blood pressure (!) 166/79, pulse 74, temperature 97.9 F (36.6 C), resp. rate 18, height 5\' 4"  (1.626 m), weight 55.3 kg, SpO2 100 %. Physical Exam Well-developed well-nourished patient in no acute distress. Alert and oriented x3 HEENT:within normal limits Cardiac: Regular rate and rhythm Pulmonary: Lungs clear to auscultation Abdomen: Soft  and nontender.  Normal active bowel sounds  Musculoskeletal: (left hip: Painful range of motion.  Limited range of motion.  Next R rotation and shortened.  Neurovascular intact distally. Assessment/Plan: 86 year old community ambulator with previous right femoral neck fracture who unfortunately suffered a left femoral neck fracture today.//The most reasonable course of action for her will be bipolar hemiarthroplasty from an anterior approach to match what she has on the other side.I have had a prolonged discussion with the patient regarding the risk and benefits of the surgical procedure.  The patient understands the risks include but are not limited to bleeding, infection and failure of the surgery to cure the  problem and need for further surgery.  The patient understands there is a slight risk of death at the time of surgery.  The patient understands these risks along with the potential benefits and wishes to proceed with surgical intervention..  The patient will be followed in the office in the postoperative period.  The surgery is currently plan for 1:30 PM tomorrow pending clearances and evaluation of lab data  Alta Corning 10/10/2020, 6:39 PM

## 2020-10-10 NOTE — Progress Notes (Deleted)
Preop instructions for:  Keyra Virella                        Date of Birth   [birth year]                          Date of Procedure:   10/11/20        Doctor:  DR [name]  Time to arrive at [facility]: 1015 Report to: Admitting  Procedure:     Do not eat  past midnight the night before your procedure.(To include any tube feedings-must be discontinued) May have clear liquids from 12 midnite until 1015am of surgery then nothing by mouth.     CLEAR LIQUID DIET   Foods Allowed                                                                       Coffee and tea, regular and decaf                              Plain Jell-O any favor except red or purple                                            Fruit ices (not with fruit pulp)                                     Iced Popsicles                                     Carbonated beverages, regular and diet                                    Cranberry, grape and apple juices Sports drinks like Gatorade Lightly seasoned clear broth or consume(fat free) Sugar, honey syrup                                                     _____________________________________________________________________      Take these morning medications only with sips of water.(or give through gastrostomy or feeding tube). Metoprolol, omeprazole ( if takes in the am )  Note: No Insulin or Diabetic meds should be given or taken the morning of the procedure!   Facility contact:   [place]                Phone: [place]:  Transportation contact phone#:  Please send day of procedure:current med list and meds last taken that day, confirm nothing by mouth  status from what time, Patient Demographic info( to include DNR status, problem list, allergies)   RN contact name/phone#:                             and Fax [employer] card and picture ID Leave all jewelry and other valuables  at place where living( no metal or rings to be worn) No contact lens Women-no make-up, no lotions,perfumes,powders Men-no colognes,lotions  Any questions day of procedure,call  SHORT [phone]   Sent from :[facility]                   [place]                   Fax:[phone]  Sent by :  Gillian Shields RN

## 2020-10-10 NOTE — Patient Instructions (Addendum)
Preop instructions for:     Alicia Boyd Date of Birth:   08-20-1930                    Date of Procedure:   10/11/2020 Procedure:    LEFT HIP HEMIARTHROPLASTY  Surgeon:  Dr. [name] Facility contact:  [facility]   Phone: [phone]                Health Care POA: RN contact name/phone#:  Persina RN                        and Fax #: [phone]   Transportation contact phone#:     Time to arrive at [facility]: 1015 AM   Report to: Admitting (On your left hand side)    Do not eat or drink past midnight the night before your procedure.(To include any tube feedings-must be discontinued)   Take these morning medications only with sips of water.(or give through gastrostomy or feeding tube).    The Day Before Surgery:    Note: No Insulin or Diabetic meds should be given or taken the morning of the procedure!     Please send day of procedure:current med list and meds last taken that day, confirm nothing by mouth status from what time, Patient Demographic info( to include DNR status, problem list, allergies)   Bring Insurance card and picture ID Leave all jewelry and other valuables at place where living( no metal or rings to be worn) No contact lens Women-no make-up, no lotions,perfumes,powders    Any questions day of procedure,call  SHORT [phone]     Sent from :[facility]                   Phone:[phone]                   Fax:[phone]   Sent by : Harlon Flor BSN, RN

## 2020-10-11 ENCOUNTER — Encounter (HOSPITAL_COMMUNITY)
Admission: AD | Disposition: A | Discharge status: Skilled Nursing Facility | Payer: Self-pay | Source: Skilled Nursing Facility | Attending: Orthopedic Surgery

## 2020-10-11 ENCOUNTER — Inpatient Hospital Stay (HOSPITAL_COMMUNITY): Payer: MEDICARE

## 2020-10-11 ENCOUNTER — Encounter (HOSPITAL_COMMUNITY): Payer: Self-pay | Admitting: Internal Medicine

## 2020-10-11 ENCOUNTER — Inpatient Hospital Stay (HOSPITAL_COMMUNITY): Payer: MEDICARE | Admitting: Certified Registered Nurse Anesthetist

## 2020-10-11 ENCOUNTER — Inpatient Hospital Stay (HOSPITAL_COMMUNITY): Admission: RE | Admit: 2020-10-11 | Payer: MEDICARE | Source: Home / Self Care | Admitting: Orthopedic Surgery

## 2020-10-11 DIAGNOSIS — S72002A Fracture of unspecified part of neck of left femur, initial encounter for closed fracture: Secondary | ICD-10-CM

## 2020-10-11 DIAGNOSIS — Z7189 Other specified counseling: Secondary | ICD-10-CM | POA: Diagnosis not present

## 2020-10-11 DIAGNOSIS — Z96649 Presence of unspecified artificial hip joint: Secondary | ICD-10-CM

## 2020-10-11 DIAGNOSIS — I10 Essential (primary) hypertension: Secondary | ICD-10-CM

## 2020-10-11 DIAGNOSIS — K219 Gastro-esophageal reflux disease without esophagitis: Secondary | ICD-10-CM

## 2020-10-11 HISTORY — DX: Gastro-esophageal reflux disease without esophagitis: K21.9

## 2020-10-11 HISTORY — DX: Peripheral vascular disease, unspecified: I73.9

## 2020-10-11 HISTORY — PX: HIP ARTHROPLASTY: SHX981

## 2020-10-11 LAB — BASIC METABOLIC PANEL
Anion gap: 9 (ref 5–15)
BUN: 19 mg/dL (ref 8–23)
CO2: 27 mmol/L (ref 22–32)
Calcium: 9 mg/dL (ref 8.9–10.3)
Chloride: 99 mmol/L (ref 98–111)
Creatinine, Ser: 0.47 mg/dL (ref 0.44–1.00)
GFR, Estimated: >60 mL/min (ref >60)
Glucose, Bld: 103 mg/dL — ABNORMAL HIGH (ref 70–99)
Potassium: 3.3 mmol/L — ABNORMAL LOW (ref 3.5–5.1)
Sodium: 135 mmol/L (ref 135–145)

## 2020-10-11 LAB — CBC
HCT: 34.9 % — ABNORMAL LOW (ref 36.0–46.0)
Hemoglobin: 11.5 g/dL — ABNORMAL LOW (ref 12.0–15.0)
MCH: 30.5 pg (ref 26.0–34.0)
MCHC: 33 g/dL (ref 30.0–36.0)
MCV: 92.6 fL (ref 80.0–100.0)
Platelets: 284 10*3/uL (ref 150–400)
RBC: 3.77 MIL/uL — ABNORMAL LOW (ref 3.87–5.11)
RDW: 12.7 % (ref 11.5–15.5)
WBC: 7.4 10*3/uL (ref 4.0–10.5)
nRBC: 0 % (ref 0.0–0.2)

## 2020-10-11 LAB — PROTIME-INR
INR: 1 (ref 0.8–1.2)
Prothrombin Time: 12.9 seconds (ref 11.4–15.2)

## 2020-10-11 LAB — MRSA PCR SCREENING: MRSA by PCR: NEGATIVE

## 2020-10-11 LAB — APTT: aPTT: 28 seconds (ref 24–36)

## 2020-10-11 SURGERY — HEMIARTHROPLASTY, HIP, DIRECT ANTERIOR APPROACH, FOR FRACTURE
Anesthesia: General | Site: Hip | Laterality: Left

## 2020-10-11 MED ORDER — ONDANSETRON HCL 4 MG/2ML IJ SOLN: 4.0000 mg | Freq: Once | INTRAMUSCULAR | Status: DC | PRN

## 2020-10-11 MED ORDER — LIDOCAINE 2% (20 MG/ML) 5 ML SYRINGE
INTRAMUSCULAR | Status: AC
  Filled 2020-10-11: qty 5

## 2020-10-11 MED ORDER — FENTANYL CITRATE (PF) 250 MCG/5ML IJ SOLN
INTRAMUSCULAR | Status: AC
  Filled 2020-10-11: qty 5

## 2020-10-11 MED ORDER — ASPIRIN EC 325 MG PO TBEC
325.0000 mg | DELAYED_RELEASE_TABLET | Freq: Two times a day (BID) | ORAL | Status: DC
  Administered 2020-10-12 – 2020-10-13 (×2): 325 mg via ORAL
  Filled 2020-10-11 (×2): qty 1

## 2020-10-11 MED ORDER — LIDOCAINE HCL (CARDIAC) PF 100 MG/5ML IV SOSY
PREFILLED_SYRINGE | INTRAVENOUS | Status: DC | PRN
  Administered 2020-10-11: 80 mg via INTRAVENOUS

## 2020-10-11 MED ORDER — TRANEXAMIC ACID-NACL 1000-0.7 MG/100ML-% IV SOLN
INTRAVENOUS | Status: AC
  Filled 2020-10-11: qty 100

## 2020-10-11 MED ORDER — LACTATED RINGERS IV SOLN: INTRAVENOUS | Status: DC | PRN

## 2020-10-11 MED ORDER — METOCLOPRAMIDE HCL 5 MG PO TABS: 5.0000 mg | ORAL_TABLET | Freq: Three times a day (TID) | ORAL | Status: DC | PRN

## 2020-10-11 MED ORDER — FENTANYL CITRATE (PF) 100 MCG/2ML IJ SOLN
INTRAMUSCULAR | Status: AC
  Administered 2020-10-11: 25 ug via INTRAVENOUS
  Filled 2020-10-11: qty 2

## 2020-10-11 MED ORDER — VANCOMYCIN HCL IN DEXTROSE 1-5 GM/200ML-% IV SOLN: 1000.0000 mg | INTRAVENOUS | Status: AC

## 2020-10-11 MED ORDER — DEXAMETHASONE SODIUM PHOSPHATE 10 MG/ML IJ SOLN
INTRAMUSCULAR | Status: DC | PRN
  Administered 2020-10-11: 4 mg via INTRAVENOUS

## 2020-10-11 MED ORDER — PHENYLEPHRINE 40 MCG/ML (10ML) SYRINGE FOR IV PUSH (FOR BLOOD PRESSURE SUPPORT)
PREFILLED_SYRINGE | INTRAVENOUS | Status: AC
  Filled 2020-10-11: qty 10

## 2020-10-11 MED ORDER — ALUM & MAG HYDROXIDE-SIMETH 200-200-20 MG/5ML PO SUSP: 30.0000 mL | ORAL | Status: DC | PRN

## 2020-10-11 MED ORDER — OXYCODONE HCL 5 MG PO TABS: 5.0000 mg | ORAL_TABLET | ORAL | Status: DC | PRN

## 2020-10-11 MED ORDER — HYDRALAZINE HCL 20 MG/ML IJ SOLN
INTRAMUSCULAR | Status: AC
  Filled 2020-10-11: qty 1

## 2020-10-11 MED ORDER — METOCLOPRAMIDE HCL 5 MG/ML IJ SOLN: 5.0000 mg | Freq: Three times a day (TID) | INTRAMUSCULAR | Status: DC | PRN

## 2020-10-11 MED ORDER — ROCURONIUM BROMIDE 10 MG/ML (PF) SYRINGE
PREFILLED_SYRINGE | INTRAVENOUS | Status: AC
  Filled 2020-10-11: qty 10

## 2020-10-11 MED ORDER — ASPIRIN EC 325 MG PO TBEC: 325.0000 mg | DELAYED_RELEASE_TABLET | Freq: Two times a day (BID) | ORAL | Status: DC

## 2020-10-11 MED ORDER — BUPIVACAINE LIPOSOME 1.3 % IJ SUSP
INTRAMUSCULAR | Status: DC | PRN
  Administered 2020-10-11: 10 mL

## 2020-10-11 MED ORDER — STERILE WATER FOR IRRIGATION IR SOLN
Status: DC | PRN
  Administered 2020-10-11: 2000 mL

## 2020-10-11 MED ORDER — FENTANYL CITRATE (PF) 100 MCG/2ML IJ SOLN
25.0000 ug | INTRAMUSCULAR | Status: DC | PRN
  Administered 2020-10-11: 50 ug via INTRAVENOUS

## 2020-10-11 MED ORDER — ONDANSETRON HCL 4 MG PO TABS: 4.0000 mg | ORAL_TABLET | Freq: Four times a day (QID) | ORAL | Status: DC | PRN

## 2020-10-11 MED ORDER — FENTANYL CITRATE (PF) 250 MCG/5ML IJ SOLN
INTRAMUSCULAR | Status: DC | PRN
Start: 2020-10-11 — End: 2020-10-11
  Administered 2020-10-11: 50 ug via INTRAVENOUS
  Administered 2020-10-11: 100 ug via INTRAVENOUS
  Administered 2020-10-11 (×2): 50 ug via INTRAVENOUS

## 2020-10-11 MED ORDER — TRANEXAMIC ACID-NACL 1000-0.7 MG/100ML-% IV SOLN
1000.0000 mg | Freq: Once | INTRAVENOUS | Status: AC
  Administered 2020-10-11: 1000 mg via INTRAVENOUS
  Filled 2020-10-11: qty 100

## 2020-10-11 MED ORDER — ROCURONIUM BROMIDE 10 MG/ML (PF) SYRINGE
PREFILLED_SYRINGE | INTRAVENOUS | Status: DC | PRN
  Administered 2020-10-11: 60 mg via INTRAVENOUS

## 2020-10-11 MED ORDER — ONDANSETRON HCL 4 MG/2ML IJ SOLN: 4.0000 mg | Freq: Four times a day (QID) | INTRAMUSCULAR | Status: DC | PRN

## 2020-10-11 MED ORDER — HYDRALAZINE HCL 20 MG/ML IJ SOLN
INTRAMUSCULAR | Status: DC | PRN
  Administered 2020-10-11 (×2): 5 mg via INTRAVENOUS

## 2020-10-11 MED ORDER — LABETALOL HCL 5 MG/ML IV SOLN
INTRAVENOUS | Status: AC
  Filled 2020-10-11: qty 4

## 2020-10-11 MED ORDER — BUPIVACAINE LIPOSOME 1.3 % IJ SUSP: 10.0000 mL | Freq: Once | INTRAMUSCULAR | Status: DC

## 2020-10-11 MED ORDER — ACETAMINOPHEN 500 MG PO TABS
ORAL_TABLET | ORAL | Status: AC
  Administered 2020-10-11: 1000 mg via ORAL
  Filled 2020-10-11: qty 2

## 2020-10-11 MED ORDER — BUPIVACAINE LIPOSOME 1.3 % IJ SUSP
10.0000 mL | Freq: Once | INTRAMUSCULAR | Status: DC
  Filled 2020-10-11: qty 10

## 2020-10-11 MED ORDER — DEXTROSE-NACL 5-0.45 % IV SOLN: INTRAVENOUS | Status: DC

## 2020-10-11 MED ORDER — VANCOMYCIN HCL IN DEXTROSE 1-5 GM/200ML-% IV SOLN
INTRAVENOUS | Status: AC
  Administered 2020-10-11: 1000 mg via INTRAVENOUS
  Filled 2020-10-11: qty 200

## 2020-10-11 MED ORDER — BUPIVACAINE HCL 0.25 % IJ SOLN
INTRAMUSCULAR | Status: AC
  Filled 2020-10-11: qty 1

## 2020-10-11 MED ORDER — LABETALOL HCL 5 MG/ML IV SOLN
INTRAVENOUS | Status: DC | PRN
  Administered 2020-10-11: 5 mg via INTRAVENOUS

## 2020-10-11 MED ORDER — POTASSIUM CHLORIDE 10 MEQ/100ML IV SOLN
10.0000 meq | Freq: Once | INTRAVENOUS | Status: AC
  Administered 2020-10-11: 10 meq via INTRAVENOUS
  Filled 2020-10-11: qty 100

## 2020-10-11 MED ORDER — ONDANSETRON HCL 4 MG/2ML IJ SOLN
INTRAMUSCULAR | Status: DC | PRN
  Administered 2020-10-11: 4 mg via INTRAVENOUS

## 2020-10-11 MED ORDER — BUPIVACAINE-EPINEPHRINE (PF) 0.5% -1:200000 IJ SOLN
INTRAMUSCULAR | Status: AC
  Filled 2020-10-11: qty 30

## 2020-10-11 MED ORDER — BUPIVACAINE-EPINEPHRINE (PF) 0.25% -1:200000 IJ SOLN
INTRAMUSCULAR | Status: DC | PRN
  Administered 2020-10-11: 30 mL

## 2020-10-11 MED ORDER — 0.9 % SODIUM CHLORIDE (POUR BTL) OPTIME
TOPICAL | Status: DC | PRN
  Administered 2020-10-11: 1000 mL

## 2020-10-11 MED ORDER — OXYCODONE HCL 5 MG PO TABS: 10.0000 mg | ORAL_TABLET | ORAL | Status: DC | PRN

## 2020-10-11 MED ORDER — ACETAMINOPHEN 325 MG PO TABS
325.0000 mg | ORAL_TABLET | Freq: Four times a day (QID) | ORAL | Status: DC | PRN
  Administered 2020-10-12: 325 mg via ORAL
  Administered 2020-10-12 – 2020-10-13 (×2): 650 mg via ORAL
  Filled 2020-10-11 (×3): qty 2
  Filled 2020-10-11: qty 1
  Filled 2020-10-11: qty 2

## 2020-10-11 MED ORDER — PROPOFOL 1000 MG/100ML IV EMUL
INTRAVENOUS | Status: AC
  Filled 2020-10-11: qty 100

## 2020-10-11 MED ORDER — PHENYLEPHRINE 40 MCG/ML (10ML) SYRINGE FOR IV PUSH (FOR BLOOD PRESSURE SUPPORT)
PREFILLED_SYRINGE | INTRAVENOUS | Status: DC | PRN
  Administered 2020-10-11 (×3): 80 ug via INTRAVENOUS

## 2020-10-11 MED ORDER — HYDROMORPHONE HCL 1 MG/ML IJ SOLN: 0.5000 mg | INTRAMUSCULAR | Status: DC | PRN

## 2020-10-11 MED ORDER — SUGAMMADEX SODIUM 200 MG/2ML IV SOLN
INTRAVENOUS | Status: DC | PRN
  Administered 2020-10-11: 200 mg via INTRAVENOUS

## 2020-10-11 MED ORDER — BUPIVACAINE-EPINEPHRINE (PF) 0.25% -1:200000 IJ SOLN
INTRAMUSCULAR | Status: AC
  Filled 2020-10-11: qty 30

## 2020-10-11 MED ORDER — ACETAMINOPHEN 500 MG PO TABS: 1000.0000 mg | ORAL_TABLET | Freq: Once | ORAL | Status: AC

## 2020-10-11 MED ORDER — PROPOFOL 500 MG/50ML IV EMUL
INTRAVENOUS | Status: DC | PRN
  Administered 2020-10-11: 120 ug/kg/min via INTRAVENOUS

## 2020-10-11 MED ORDER — TRANEXAMIC ACID-NACL 1000-0.7 MG/100ML-% IV SOLN
1000.0000 mg | INTRAVENOUS | Status: AC
  Administered 2020-10-11: 1000 mg via INTRAVENOUS

## 2020-10-11 MED ORDER — VANCOMYCIN HCL IN DEXTROSE 1-5 GM/200ML-% IV SOLN
1000.0000 mg | Freq: Once | INTRAVENOUS | Status: AC
  Administered 2020-10-11: 1000 mg via INTRAVENOUS
  Filled 2020-10-11: qty 200

## 2020-10-11 MED ORDER — FERROUS SULFATE 325 (65 FE) MG PO TABS
325.0000 mg | ORAL_TABLET | Freq: Two times a day (BID) | ORAL | Status: DC
  Administered 2020-10-13: 325 mg via ORAL
  Filled 2020-10-11: qty 1

## 2020-10-11 MED ORDER — PROPOFOL 10 MG/ML IV BOLUS
INTRAVENOUS | Status: DC | PRN
  Administered 2020-10-11: 120 mg via INTRAVENOUS
  Administered 2020-10-11: 30 mg via INTRAVENOUS

## 2020-10-11 MED ORDER — DOCUSATE SODIUM 100 MG PO CAPS: 100.0000 mg | ORAL_CAPSULE | Freq: Two times a day (BID) | ORAL | Status: DC

## 2020-10-11 SURGICAL SUPPLY — 59 items
APL SKNCLS STERI-STRIP NONHPOA (GAUZE/BANDAGES/DRESSINGS) ×1
BAG SPEC THK2 15X12 ZIP CLS (MISCELLANEOUS) ×1
BAG ZIPLOCK 12X15 (MISCELLANEOUS) ×3 IMPLANT
BENZOIN TINCTURE PRP APPL 2/3 (GAUZE/BANDAGES/DRESSINGS) ×2 IMPLANT
BIPOLAR DEPUY 48 (Hips) ×2 IMPLANT
BIPOLAR DEPUY 48MM (Hips) ×1 IMPLANT
BIT DRILL 2.4X128 (BIT) ×2 IMPLANT
BIT DRILL 2.4X128MM (BIT) ×1
BLADE SAW SAG 73X25 THK (BLADE) ×2
BLADE SAW SGTL 73X25 THK (BLADE) ×1 IMPLANT
BRUSH FEMORAL CANAL (MISCELLANEOUS) IMPLANT
CLOSURE WOUND 1/2 X4 (GAUZE/BANDAGES/DRESSINGS) ×1
COLLAR OFFSET CORAIL SZ 16 HIP (Stem) IMPLANT
CORAIL OFFSET COLLAR SZ 16 HIP (Stem) ×3 IMPLANT
COVER WAND RF STERILE (DRAPES) IMPLANT
DRAPE INCISE IOBAN 66X45 STRL (DRAPES) ×3 IMPLANT
DRAPE ORTHO SPLIT 77X108 STRL (DRAPES) ×6
DRAPE POUCH INSTRU U-SHP 10X18 (DRAPES) ×3 IMPLANT
DRAPE SHEET LG 3/4 BI-LAMINATE (DRAPES) ×3 IMPLANT
DRAPE SURG ORHT 6 SPLT 77X108 (DRAPES) ×2 IMPLANT
DRSG AQUACEL AG ADV 3.5X 6 (GAUZE/BANDAGES/DRESSINGS) ×2 IMPLANT
DRSG MEPILEX BORDER 4X8 (GAUZE/BANDAGES/DRESSINGS) ×3 IMPLANT
DURAPREP 26ML APPLICATOR (WOUND CARE) ×3 IMPLANT
ELECT BLADE TIP CTD 4 INCH (ELECTRODE) ×3 IMPLANT
ELECT REM PT RETURN 15FT ADLT (MISCELLANEOUS) ×3 IMPLANT
EVACUATOR 1/8 PVC DRAIN (DRAIN) IMPLANT
GLOVE BIO SURGEON STRL SZ8 (GLOVE) ×6 IMPLANT
GLOVE ORTHO TXT STRL SZ7.5 (GLOVE) ×6 IMPLANT
HANDPIECE INTERPULSE COAX TIP (DISPOSABLE)
HEAD BIPOLAR DEPUY 48 (Hips) IMPLANT
HEAD FEM STD 28X+1.5 STRL (Hips) ×2 IMPLANT
HOOD PEEL AWAY FLYTE STAYCOOL (MISCELLANEOUS) ×6 IMPLANT
IMMOBILIZER KNEE 20 (SOFTGOODS)
IMMOBILIZER KNEE 20 THIGH 36 (SOFTGOODS) IMPLANT
KIT TURNOVER KIT A (KITS) IMPLANT
MANIFOLD NEPTUNE II (INSTRUMENTS) ×3 IMPLANT
NEEDLE HYPO 22GX1.5 SAFETY (NEEDLE) ×3 IMPLANT
NS IRRIG 1000ML POUR BTL (IV SOLUTION) ×3 IMPLANT
PACK TOTAL JOINT (CUSTOM PROCEDURE TRAY) ×3 IMPLANT
PASSER SUT SWANSON 36MM LOOP (INSTRUMENTS) ×3 IMPLANT
PENCIL SMOKE EVACUATOR (MISCELLANEOUS) IMPLANT
PRESSURIZER FEMORAL UNIV (MISCELLANEOUS) IMPLANT
PROTECTOR NERVE ULNAR (MISCELLANEOUS) ×3 IMPLANT
SET HNDPC FAN SPRY TIP SCT (DISPOSABLE) IMPLANT
STAPLER VISISTAT 35W (STAPLE) ×3 IMPLANT
STRIP CLOSURE SKIN 1/2X4 (GAUZE/BANDAGES/DRESSINGS) ×1 IMPLANT
SUCTION FRAZIER HANDLE 10FR (MISCELLANEOUS) ×3
SUCTION TUBE FRAZIER 10FR DISP (MISCELLANEOUS) ×1 IMPLANT
SUT ETHIBOND NAB CT1 #1 30IN (SUTURE) ×12 IMPLANT
SUT VIC AB 0 CTX 27 (SUTURE) ×6 IMPLANT
SUT VIC AB 1 CTX 36 (SUTURE) ×12
SUT VIC AB 1 CTX36XBRD ANBCTR (SUTURE) ×4 IMPLANT
SUT VIC AB 2-0 CT1 27 (SUTURE) ×12
SUT VIC AB 2-0 CT1 TAPERPNT 27 (SUTURE) ×3 IMPLANT
SYR CONTROL 10ML LL (SYRINGE) ×3 IMPLANT
TOWEL OR 17X26 10 PK STRL BLUE (TOWEL DISPOSABLE) ×3 IMPLANT
TOWER CARTRIDGE SMART MIX (DISPOSABLE) IMPLANT
TRAY FOLEY MTR SLVR 16FR STAT (SET/KITS/TRAYS/PACK) IMPLANT
WATER STERILE IRR 1000ML POUR (IV SOLUTION) ×3 IMPLANT

## 2020-10-11 NOTE — Anesthesia Preprocedure Evaluation (Addendum)
Anesthesia Evaluation  Patient identified by MRN, date of birth, ID band Patient awake    Reviewed: Allergy & Precautions, NPO status , Patient's Chart, lab work & pertinent test results, reviewed documented beta blocker date and time   History of Anesthesia Complications Negative for: history of anesthetic complications  Airway Mallampati: II  TM Distance: >3 FB Neck ROM: Full    Dental  (+) Dental Advisory Given, Teeth Intact   Pulmonary neg pulmonary ROS,    breath sounds clear to auscultation       Cardiovascular hypertension, Pt. on home beta blockers and Pt. on medications + Peripheral Vascular Disease  + Valvular Problems/Murmurs  Rhythm:Regular Rate:Normal  ECHO 09/2019:  1. Left ventricular ejection fraction, by visual estimation, is 50 to  55%. The left ventricle has normal function. There is moderately increased  left ventricular hypertrophy.  2. Left ventricular diastolic Doppler parameters are consistent with  pseudonormalization pattern of LV diastolic filling.  3. Global right ventricle has normal systolic function.The right  ventricular size is normal. No increase in right ventricular wall  thickness.  4. Moderate pleural effusion.  5. The aortic valve is tricuspid Aortic valve regurgitation is mild to  moderate by color flow Doppler.  6. Thickened interatrial septum measuring 15 mm  7. Left atrial size was severely dilated.  8. Normal pulmonary artery systolic pressure.  9. The inferior vena cava is normal in size with greater than 50%  respiratory variability, suggesting right atrial pressure of 3 mmHg.  10. Given moderate LVH with significant atrial dilatation and interatrial  septal thickening, would consider evaluation for cardiac amyloidosis.  Would consider cardiac MRI if clinically indicated.    Neuro/Psych  Headaches (ocular migraines), PSYCHIATRIC DISORDERS Anxiety Depression     GI/Hepatic Neg liver ROS, neg GERD  ,  Endo/Other  negative endocrine ROS  Renal/GU negative Renal ROS     Musculoskeletal  (+) Arthritis ,   Abdominal   Peds  Hematology negative hematology ROS (+)   Anesthesia Other Findings   Reproductive/Obstetrics                             Anesthesia Physical  Anesthesia Plan  ASA: III  Anesthesia Plan: General   Post-op Pain Management:    Induction: Intravenous  PONV Risk Score and Plan: 4 or greater and Treatment may vary due to age or medical condition, Ondansetron and Propofol infusion  Airway Management Planned: Oral ETT  Additional Equipment: None  Intra-op Plan:   Post-operative Plan: Extubation in OR  Informed Consent: I have reviewed the patients History and Physical, chart, labs and discussed the procedure including the risks, benefits and alternatives for the proposed anesthesia with the patient or authorized representative who has indicated his/her understanding and acceptance.   Patient has DNR.  Discussed DNR with patient and Continue DNR.   Dental advisory given  Plan Discussed with:   Anesthesia Plan Comments: (In the past for surgery in 2020. Patient agreeable to IV fluids, vasopressors, and intubation for procedure. Patient did not want chest compressions under any circumstance. Will readdress with patient for this particular surgery. Patient still does NOT want chest compressions but she will accept all other medical interventions. She understands that the type of anesthesia requires intubation. She prefers GETA over spinal. Will plan to do a TIVA to minimize inhalational agent and postop cognitive dysfunction. Norton Blizzard, MD  )       Anesthesia Quick  Evaluation

## 2020-10-11 NOTE — Progress Notes (Signed)
Subjective: Patient continues complain of left hip pain  Objective: Vital signs in last 24 hours: Temp:  [97.5 F (36.4 C)-98.8 F (37.1 C)] 98 F (36.7 C) (10/13 1229) Pulse Rate:  [67-74] 67 (10/13 1229) Resp:  [16-20] 16 (10/13 1229) BP: (144-172)/(63-81) 172/81 (10/13 1229) SpO2:  [95 %-100 %] 99 % (10/13 1229) Weight:  [55.3 kg] 55.3 kg (10/12 1815)  Intake/Output from previous day: 10/12 0701 - 10/13 0700 In: 240 [P.O.:240] Out: 1450 [Urine:1450] Intake/Output this shift: Total I/O In: 0  Out: 100 [Urine:100]  Recent Labs    10/10/20 1806 10/11/20 0340  HGB 13.3 11.5*   Recent Labs    10/10/20 1806 10/11/20 0340  WBC 8.9 7.4  RBC 4.31 3.77*  HCT 40.9 34.9*  PLT 353 284   Recent Labs    10/10/20 1806 10/11/20 0340  NA 136 135  K 3.4* 3.3*  CL 98 99  CO2 28 27  BUN 16 19  CREATININE 0.46 0.47  GLUCOSE 103* 103*  CALCIUM 9.6 9.0   Recent Labs    10/10/20 1806 10/11/20 1137  INR 1.0 1.0    Neurologically intact ABD soft Neurovascular intact Sensation intact distally Intact pulses distally Dorsiflexion/Plantar flexion intact No cellulitis present Compartment soft    Assessment/Plan: 86 year old female with displaced left femoral neck fracture who did well with hemiarthroplasty from the front on the right side 2 years ago.  She will need hemiarthroplasty left hip today.    Alta Corning 10/11/2020, 1:31 PM

## 2020-10-11 NOTE — Op Note (Signed)
NAME: Alicia Boyd, Alicia Boyd MEDICAL RECORD [id number] ACCOUNT [id number] DATE OF BIRTH:05-15-1930 FACILITY[facility] LOCATION: [facility] PHYSICIAN:Maikol Grassia L. Ricke Kimoto, MD  OPERATIVE REPORT  DATE OF PROCEDURE:  10/11/2020  PREOPERATIVE DIAGNOSIS:  Femoral neck fracture with displacement.  POSTOPERATIVE DIAGNOSIS:  Femoral neck fracture with displacement.  PROCEDURE:   1.  Left bipolar hemiarthroplasty with a Corail stem size 16, 28 mm head with a 48 mm outer diameter bipolar shell, +1.5 offset.  We used a high-offset Corail stem.   2.  Interpretation of multiple intraoperative fluoroscopic images.  SURGEON:  [name], MD  ASSISTANT:  Harmon Dun, PA-C, was present for the entire case and assisted by manipulation of the leg, retraction of tissues, and closing to minimize OR time.  BRIEF HISTORY:  The patient is an 86 year old female with a hemiarthroplasty done on the right side many years ago.  She had done great with that.  She unfortunately fell a of couple weeks ago and had been having pain off and on and then progressed to  where she was not able to stand and walk.  She came into the office.  X-ray showed that she had a displaced femoral neck fracture.  We talked about treatment options and felt that she needed to be a direct admit to the hospital and be fixed once she was  cleared and we had OR time.  She was brought to the operating room today for hemiarthroplasty from an anterior approach.  DESCRIPTION OF PROCEDURE:  The patient was brought to the operating room.  After adequate anesthesia obtained with a general anesthetic, the patient was placed supine on the operating table.  She was moved onto the [place] bed.  All bony prominences well  padded.  Attention was turned to the left hip where after routine prep and drape, an incision was made for an anterior approach to the hip, subcutaneous tissue down to level of the tensor fascia divided in line with its fibers.  Retractors put in place   above and below the femoral neck and at this time, attention was turned towards making a capsulotomy and tagging that.  We then removed the head in total and measured it on the back table to a 48 ball and a stick 48, got a good suction fit and at that  point, we removed all remaining bony fragments, irrigated the acetabulum, checked it to make sure there was no significant arthritic change.  I then turned towards the stem side.  The stem was then opened with a cookie cutter, followed by a chili pepper,  followed by sequential rasping up to a size 15.  We did a trial reduction.  It was a little bit short.  I felt that I knew what needed to do in terms of length, brought it back over.  Unfortunately, the 15 had wiggled a little bit, so I went with a 16  high offset and I broached down to a 16, then opened a  16 and placed it.  I put the shortest ball on as it did stay a little bit proud and then we bone grafted around the calcar area with some of the remnants of cancellous bone.  We did a closed  reduction at this point, then got our final images.  Anatomic reduction had been achieved and symmetric leg length was achieved.  The wound was irrigated, suctioned dry.  Exparel was instilled all throughout the tissues for postoperative pain control.   The capsule was closed with #1 Vicryl running.  The tensor fascia was closed with 0 Vicryl running.  The skin was closed with 2-0 Vicryl and 3-0 Monocryl subcuticular.  Benzoin and Steri-Strips were applied.  Sterile compressive dressing was applied.   The patient was taken to recovery.  She was noted to be in satisfactory condition.  Estimated blood loss for the procedure was 225 mL.  Again, Filbert Berthold was present for the entire case and assisted by retraction of tissues, manipulation of the leg and  closure to minimize OR time.  VN/NUANCE  D:10/11/2020 T:10/11/2020 JOB:[id number]

## 2020-10-11 NOTE — Plan of Care (Signed)
  Problem: Education: Goal: Knowledge of General Education information will improve Description: Including pain rating scale, medication(s)/side effects and non-pharmacologic comfort measures Outcome: Progressing   Problem: Coping: Goal: Level of anxiety will decrease Outcome: Progressing   Problem: Pain Managment: Goal: General experience of comfort will improve Outcome: Progressing   Problem: Education: Goal: Verbalization of understanding the information provided (i.e., activity precautions, restrictions, etc) will improve Outcome: Progressing   Problem: Pain Management: Goal: Pain level will decrease Outcome: Progressing

## 2020-10-11 NOTE — Brief Op Note (Signed)
10/10/2020 - 10/11/2020  3:24 PM  PATIENT:  Alicia Boyd  86 y.o. female  PRE-OPERATIVE DIAGNOSIS:  LEFT HIP TRAMATIC FEMORAL WITH FRACTURE  POST-OPERATIVE DIAGNOSIS:  LEFT HIP TRAMATIC FEMORAL WITH FRACTURE  PROCEDURE:  Procedure(s): ARTHROPLASTY BIPOLAR HIP (HEMIARTHROPLASTY) ANTERIOR APPROACH (Left)  SURGEON:  Surgeon(s) and Role:    [name], MD - Primary  PHYSICIAN ASSISTANT:   ASSISTANTS: jim bethune   ANESTHESIA:   general  EBL:  225 mL   BLOOD ADMINISTERED:none  DRAINS: none   LOCAL MEDICATIONS USED:  MARCAINE    and OTHER experel  SPECIMEN:  No Specimen  DISPOSITION OF SPECIMEN:  N/A  COUNTS:  YES  TOURNIQUET:  * No tourniquets in log *  DICTATION: .Other Dictation: Dictation Number [id number]  PLAN OF CARE: Admit to inpatient   PATIENT DISPOSITION:  PACU - hemodynamically stable.   Delay start of Pharmacological VTE agent (>24hrs) due to surgical blood loss or risk of bleeding: no

## 2020-10-11 NOTE — Progress Notes (Signed)
[facility]  PROGRESS NOTE  Alicia Boyd HDQ:[id number] DOB: 1930/04/05 DOA: 10/10/2020 PCP: [name], MD   Brief HPI:   86 year old female with history of osteoporosis, prior right femoral neck fracture, hypertension was sent from orthopedic surgery clinic for finding of left femoral neck fracture.  As per the patient 3 weeks ago she noted a sudden pop involving left hip while ambulating down the hallway with her friend at facility resulting in joint pain on ambulation and movement.  Initial x-ray was negative for acute fracture.  Patient was tried multiple and this without success.  Repeat x-ray showed finding of femoral neck fracture.    Subjective   Patient seen and examined, plan for surgery today.   Assessment/Plan:     1. Left femoral neck fracture-presented from orthopedic office.  Surgery scheduled for today.  Keep NPO. 2. Hypertension-blood pressure stable, continue metoprolol, Norvasc, Lasix per home regimen. 3. Hypokalemia-potassium is 3.3, will replace potassium and follow BMP in am. 4. GERD-continue PPI 5. Osteoporosis-outpatient BMD noted to have T score of -4.6 on 09/19/2020.  Patient had not tolerated Fosamax due to dysphagia in the past.  Plan was to start Prolia however patient was in process of deciding if she wants to pursue the regimen or not.     COVID-19 Labs  No results for input(s): DDIMER, FERRITIN, LDH, CRP in the last 72 hours.  Lab Results  Component Value Date   SARSCOV2NAA NEGATIVE 10/10/2020   SARSCOV2NAA NEGATIVE 10/03/2019   [place] NEGATIVE 09/07/2019   [place] NEGATIVE 09/03/2019     Scheduled medications:   . [MAR Hold] amLODipine  5 mg Oral Daily  . bupivacaine liposome  10 mL Other Once  . [MAR Hold] docusate sodium  100 mg Oral BID  . fentaNYL      . [MAR Hold] furosemide  40 mg Oral Daily  . [MAR Hold] lactose free nutrition  237 mL Oral Daily  . [MAR Hold] metoprolol succinate  37.5 mg Oral Daily  .  [MAR Hold] pantoprazole  40 mg Oral Daily         CBG: No results for input(s): GLUCAP in the last 168 hours.  SpO2: 99 %    CBC: Recent Labs  Lab 10/10/20 1806 10/11/20 0340  WBC 8.9 7.4  NEUTROABS 7.4  --   HGB 13.3 11.5*  HCT 40.9 34.9*  MCV 94.9 92.6  PLT 353 119    Basic Metabolic Panel: Recent Labs  Lab 10/10/20 1806 10/11/20 0340  NA 136 135  K 3.4* 3.3*  CL 98 99  CO2 28 27  GLUCOSE 103* 103*  BUN 16 19  CREATININE 0.46 0.47  CALCIUM 9.6 9.0     Liver Function Tests: Recent Labs  Lab 10/10/20 1806  AST 14*  ALT 16  ALKPHOS 114  BILITOT 0.6  PROT 6.5  ALBUMIN 4.0     Antibiotics: Anti-infectives (From admission, onward)   Start     Dose/Rate Route Frequency Ordered Stop   10/12/20 0600  vancomycin (VANCOCIN) IVPB 1000 mg/200 mL premix        1,000 mg 200 mL/hr over 60 Minutes Intravenous On call to O.R. 10/11/20 1144 10/11/20 1309   10/11/20 1600  vancomycin (VANCOCIN) IVPB 1000 mg/200 mL premix        1,000 mg 200 mL/hr over 60 Minutes Intravenous  Once 10/11/20 1559         DVT prophylaxis: Lovenox  Code Status: DNR  Family Communication: No family at  bedside    Status is: Inpatient  Dispo: The patient is from: Home              Anticipated d/c is to: Skilled nursing facility              Anticipated d/c date is: 10/13/2020              Patient currently not medically stable for discharge  Barrier to discharge-plan for ORIF left femoral neck fracture      Consultants:  Orthopedics  Procedures:     Objective   Vitals:   10/10/20 2123 10/11/20 0138 10/11/20 0533 10/11/20 1229  BP: (!) 144/63 (!) 158/68 (!) 161/71 (!) 172/81  Pulse: 70 71 67 67  Resp: 20 18 17 16   Temp: 98.8 F (37.1 C) 98.2 F (36.8 C) (!) 97.5 F (36.4 C) 98 F (36.7 C)  TempSrc: Oral Oral Oral Oral  SpO2: 95% 96% 97% 99%  Weight:      Height:        Intake/Output Summary (Last 24 hours) at 10/11/2020 1603 Last data filed at  10/11/2020 1509 Gross per 24 hour  Intake 1840 ml  Output 1775 ml  Net 65 ml    10/11 1901 - 10/13 0700 In: 240 [P.O.:240] Out: 1450 [Urine:1450]  Filed Weights   10/10/20 1815  Weight: 55.3 kg    Physical Examination:    General: Appears in no acute distress  Cardiovascular: S1-S2, regular, no murmur auscultated  Respiratory: Clear to auscultation bilaterally  Abdomen: Abdomen is soft, nontender, no organomegaly  Extremities: No edema in the lower extremities  Neurologic: Alert, oriented x3, intact insight and judgment    Data Reviewed:   Recent Results (from the past 240 hour(s))  Respiratory Panel by RT PCR (Flu A&B, Covid) - Nasopharyngeal Swab     Status: None   Collection Time: 10/10/20  7:10 PM   Specimen: Nasopharyngeal Swab  Result Value Ref Range Status   SARS Coronavirus 2 by RT PCR NEGATIVE NEGATIVE Final    Comment: (NOTE) SARS-CoV-2 target nucleic acids are NOT DETECTED.  The SARS-CoV-2 RNA is generally detectable in upper respiratoy specimens during the acute phase of infection. The lowest concentration of SARS-CoV-2 viral copies this assay can detect is 131 copies/mL. A negative result does not preclude SARS-Cov-2 infection and should not be used as the sole basis for treatment or other patient management decisions. A negative result may occur with  improper specimen collection/handling, submission of specimen other than nasopharyngeal swab, presence of viral mutation(s) within the areas targeted by this assay, and inadequate number of viral copies (<131 copies/mL). A negative result must be combined with clinical observations, patient history, and epidemiological information. The expected result is Negative.  Fact Sheet for Patients:  PinkCheek.be  Fact Sheet for Healthcare Providers:  GravelBags.it  This test is no t yet approved or cleared by the [place] FDA and  has been  authorized for detection and/or diagnosis of SARS-CoV-2 by FDA under an Emergency Use Authorization (EUA). This EUA will remain  in effect (meaning this test can be used) for the duration of the COVID-19 declaration under Section 564(b)(1) of the Act, 21 U.S.C. section 360bbb-3(b)(1), unless the authorization is terminated or revoked sooner.     Influenza A by PCR NEGATIVE NEGATIVE Final   Influenza B by PCR NEGATIVE NEGATIVE Final    Comment: (NOTE) The Xpert Xpress SARS-CoV-2/FLU/RSV assay is intended as an aid in  the diagnosis of influenza from  Nasopharyngeal swab specimens and  should not be used as a sole basis for treatment. Nasal washings and  aspirates are unacceptable for Xpert Xpress SARS-CoV-2/FLU/RSV  testing.  Fact Sheet for Patients: PinkCheek.be  Fact Sheet for Healthcare Providers: GravelBags.it  This test is not yet approved or cleared by the [place] FDA and  has been authorized for detection and/or diagnosis of SARS-CoV-2 by  FDA under an Emergency Use Authorization (EUA). This EUA will remain  in effect (meaning this test can be used) for the duration of the  Covid-19 declaration under Section 564(b)(1) of the Act, 21  U.S.C. section 360bbb-3(b)(1), unless the authorization is  terminated or revoked. Performed at [facility], [place] [address], [place], [place] [address]   MRSA PCR Screening     Status: None   Collection Time: 10/11/20  1:54 AM   Specimen: Nasal Mucosa; Nasopharyngeal  Result Value Ref Range Status   MRSA by PCR NEGATIVE NEGATIVE Final    Comment:        The GeneXpert MRSA Assay (FDA approved for NASAL specimens only), is one component of a comprehensive MRSA colonization surveillance program. It is not intended to diagnose MRSA infection nor to guide or monitor treatment for MRSA infections. Performed at [facility], [place] [address], [place], [place] [address]     No results for input(s): LIPASE, AMYLASE in the last 168 hours. No results for input(s): AMMONIA in the last 168 hours.  Cardiac Enzymes: No results for input(s): CKTOTAL, CKMB, CKMBINDEX, TROPONINI in the last 168 hours. BNP (last 3 results) No results for input(s): BNP in the last 8760 hours.  ProBNP (last 3 results) No results for input(s): PROBNP in the last 8760 hours.  Studies:  Chest Portable 1 View  Result Date: 10/10/2020 CLINICAL DATA:  Preoperative radiograph EXAM: PORTABLE CHEST 1 VIEW COMPARISON:  Radiograph 11/03/2019 FINDINGS: Calcified granuloma in the right lung with additional calcified right hilar lymph node compatible sequela of prior granulomatous disease unchanged from comparison imaging. Some chronically coarsened interstitial and bronchitic features are also similar to comparison. No consolidation, features of edema, pneumothorax, or effusion. The aorta is calcified. The remaining cardiomediastinal contours are unremarkable. No acute osseous or soft tissue abnormality. Degenerative changes are present in the imaged spine and shoulders. IMPRESSION: 1. No acute cardiopulmonary abnormality. 2. Chronically coarsened interstitial and bronchitic features, similar to comparison imaging. 3. Right lung granuloma and calcified right hilar lymph node compatible with sequela of prior granulomatous disease. 4.  Aortic Atherosclerosis (ICD10-I70.0). Electronically Signed   By: Lovena Le M.D.   On: 10/10/2020 19:02   DG C-Arm 1-60 Min-No Report  Result Date: 10/11/2020 Fluoroscopy was utilized by the requesting physician.  No radiographic interpretation.       Oswald Hillock   [facility] If 7PM-7AM, please contact night-coverage at [email or web address], Office  [phone]   10/11/2020, 4:03 PM  LOS: 1 day

## 2020-10-11 NOTE — Transfer of Care (Signed)
Immediate Anesthesia Transfer of Care Note  Patient: Alicia Boyd  Procedure(s) Performed: ARTHROPLASTY BIPOLAR HIP (HEMIARTHROPLASTY) ANTERIOR APPROACH (Left Hip)  Patient Location: PACU  Anesthesia Type:General  Level of Consciousness: awake, alert  and oriented  Airway & Oxygen Therapy: Patient Spontanous Breathing and Patient connected to face mask oxygen  Post-op Assessment: Report given to RN and Post -op Vital signs reviewed and stable  Post vital signs: Reviewed and stable  Last Vitals:  Vitals Value Taken Time  BP 112/73 10/11/20 1545  Temp    Pulse 73 10/11/20 1549  Resp 14 10/11/20 1549  SpO2 100 % 10/11/20 1549  Vitals shown include unvalidated device data.  Last Pain:  Vitals:   10/11/20 1229  TempSrc: Oral  PainSc:          Complications: No complications documented.

## 2020-10-11 NOTE — TOC Initial Note (Signed)
Transition of Care [facility]) - Initial/Assessment Note    Patient Details  Name: Alicia Boyd MRN: [id number] Date of Birth: Sep 11, 1930  Transition of Care [facility]) CM/SW Contact:    Lennart Pall, LCSW Phone Number: 10/11/2020, 1:32 PM  Clinical Narrative:                 Met with pt today to discuss dc plans.  She reports that she has been a resident in the ALF at [facility] [place] since April 2021.  She is widowed and without any close relatives.  Several supportive friends with contacts in the chart.   She is aware that I will follow to assist with her return to [facility] and is agreeable with plan to dc to SNF for rehab prior to return to ALF apartment. I have spoken with friend, [name], per pt's request and alerted her to plan as well.  Continue to follow.  Expected Discharge Plan: Skilled Nursing Facility Barriers to Discharge: Continued Medical Work up   Patient Goals and CMS Choice Patient states their goals for this hospitalization and ongoing recovery are:: to initially return to [facility] [place] via the SNF level of care      Expected Discharge Plan and Services Expected Discharge Plan: [place] In-house Referral: Clinical Social Work     Living arrangements for the past 2 months: [place] (at [facility])                                      Prior Living Arrangements/Services Living arrangements for the past 2 months: [place] (at [facility]) Lives with:: Facility Resident Patient language and need for interpreter reviewed:: Yes Do you feel safe going back to the place where you live?: Yes      Need for Family Participation in Patient Care: No (Comment) Care giver support system in place?: Yes (comment)   Criminal Activity/Legal Involvement Pertinent to Current Situation/Hospitalization: No - Comment as needed  Activities of Daily Living Home Assistive Devices/Equipment: Environmental consultant  (specify type), Raised toilet seat with rails (front wheel) ADL Screening (condition at time of admission) Patient's cognitive ability adequate to safely complete daily activities?: Yes Is the patient deaf or have difficulty hearing?: No Does the patient have difficulty seeing, even when wearing glasses/contacts?: No Does the patient have difficulty concentrating, remembering, or making decisions?: No Patient able to express need for assistance with ADLs?: Yes Does the patient have difficulty dressing or bathing?: No Independently performs ADLs?: Yes (appropriate for developmental age) Does the patient have difficulty walking or climbing stairs?: No Weakness of Legs: None Weakness of Arms/Hands: None  Permission Sought/Granted Permission sought to share information with : Customer service manager, Other (comment)    Share Information with NAME: [name] (friend) @  Permission granted to share info w AGENCY: admissions coordinator at [place].        Emotional Assessment Appearance:: Appears stated age Attitude/Demeanor/Rapport: Engaged, Gracious Affect (typically observed): Accepting, Pleasant Orientation: : Oriented to Self, Oriented to Place, Oriented to  Time, Oriented to Situation Alcohol / Substance Use: Not Applicable Psych Involvement: No (comment)  Admission diagnosis:  Left displaced femoral neck fracture ([place]) [S72.002A] Patient Active Problem List   Diagnosis Date Noted  . Displaced fracture of left femoral neck ([place]) 10/10/2020  . Left displaced femoral neck fracture ([place]) 10/10/2020  . DNR (  do not resuscitate) discussion 10/10/2020  . Nondisplaced fracture of body of right calcaneus, initial encounter for closed fracture 08/30/2020  . Left knee pain 06/27/2020  . Contact dermatitis 06/19/2020  . Left hip pain 05/10/2020  . Unsteady gait 05/10/2020  . Bilateral pleural effusion 10/03/2019  . Hypokalemia 10/03/2019  . Malnutrition of  moderate degree 09/08/2019  . Displaced fracture of right femoral neck ([place]) 09/03/2019  . Failure to thrive in adult 09/01/2019  . Quadriceps strain, right, initial encounter 09/01/2019  . Anxiety and depression 07/05/2019  . Greater trochanteric bursitis of left hip 06/22/2019  . Closed fracture of multiple pubic rami, right, initial encounter ([place]) 05/28/2019  . Greater trochanteric bursitis of right hip 01/05/2019  . Hypertensive retinopathy, bilateral 04/29/2018  . Nuclear sclerosis of both eyes 04/29/2018  . Degenerative arthritis of left knee 04/16/2018  . Mild carpal tunnel syndrome of right wrist 09/24/2017  . Ecchymoses, spontaneous 07/29/2016  . Varicose veins 07/29/2016  . Nasal congestion 07/29/2016  . Hyperglycemia 07/12/2015  . Retinal hemorrhage, right 06/06/2014  . Paresthesia of hand 03/22/2014  . Undiagnosed cardiac murmurs 03/22/2014  . Cataract 04/10/2012  . Ocular migraine 04/10/2012  . Palpitations 03/03/2012  . ANEMIA, MILD 07/27/2010  . Venous (peripheral) insufficiency 07/27/2010  . OTHER CONSTIPATION 04/17/2010  . Essential hypertension 04/06/2010  . RHINOCONJUNCTIVITIS, ALLERGIC 04/04/2010  . PERSONAL HISTORY OTHER DISORDER URINARY SYSTEM 01/25/2010  . OTHER DYSPHAGIA 04/11/2009  . Esophageal reflux 01/24/2009  . OTHER AND UNSPECIFIED HYPERLIPIDEMIA 11/23/2008  . Osteoporosis 11/23/2008  . NONSPECIFIC ABNORMAL ELECTROCARDIOGRAM 11/23/2008   PCP:  [name], MD Pharmacy:  No Pharmacies Listed    Social Determinants of Health (SDOH) Interventions    Readmission Risk Interventions Readmission Risk Prevention Plan 10/11/2020  Post Dischage Appt Complete  Medication Screening Complete  Transportation Screening Complete  Some recent data might be hidden

## 2020-10-11 NOTE — Plan of Care (Signed)

## 2020-10-11 NOTE — Anesthesia Procedure Notes (Signed)
Procedure Name: Intubation Date/Time: 10/11/2020 1:42 PM Performed by: Raenette Rover, CRNA Pre-anesthesia Checklist: Patient identified, Emergency Drugs available, Suction available and Patient being monitored Patient Re-evaluated:Patient Re-evaluated prior to induction Oxygen Delivery Method: Circle system utilized Preoxygenation: Pre-oxygenation with 100% oxygen Induction Type: IV induction Ventilation: Mask ventilation without difficulty Laryngoscope Size: Mac and 3 Grade View: Grade I Tube type: Oral Tube size: 7.0 mm Number of attempts: 1 Airway Equipment and Method: Stylet Placement Confirmation: ETT inserted through vocal cords under direct vision,  positive ETCO2 and breath sounds checked- equal and bilateral Secured at: 21 cm Tube secured with: Tape Dental Injury: Teeth and Oropharynx as per pre-operative assessment

## 2020-10-12 ENCOUNTER — Encounter (HOSPITAL_COMMUNITY): Payer: Self-pay | Admitting: Orthopedic Surgery

## 2020-10-12 DIAGNOSIS — I10 Essential (primary) hypertension: Secondary | ICD-10-CM | POA: Diagnosis not present

## 2020-10-12 DIAGNOSIS — K219 Gastro-esophageal reflux disease without esophagitis: Secondary | ICD-10-CM | POA: Diagnosis not present

## 2020-10-12 DIAGNOSIS — S72002A Fracture of unspecified part of neck of left femur, initial encounter for closed fracture: Secondary | ICD-10-CM | POA: Diagnosis not present

## 2020-10-12 DIAGNOSIS — Z7189 Other specified counseling: Secondary | ICD-10-CM | POA: Diagnosis not present

## 2020-10-12 LAB — CBC
HCT: 32.7 % — ABNORMAL LOW (ref 36.0–46.0)
Hemoglobin: 10.8 g/dL — ABNORMAL LOW (ref 12.0–15.0)
MCH: 31.1 pg (ref 26.0–34.0)
MCHC: 33 g/dL (ref 30.0–36.0)
MCV: 94.2 fL (ref 80.0–100.0)
Platelets: 287 10*3/uL (ref 150–400)
RBC: 3.47 MIL/uL — ABNORMAL LOW (ref 3.87–5.11)
RDW: 12.7 % (ref 11.5–15.5)
WBC: 12.1 10*3/uL — ABNORMAL HIGH (ref 4.0–10.5)
nRBC: 0 % (ref 0.0–0.2)

## 2020-10-12 LAB — BASIC METABOLIC PANEL
Anion gap: 6 (ref 5–15)
Anion gap: 8 (ref 5–15)
BUN: 16 mg/dL (ref 8–23)
BUN: 12 mg/dL (ref 8–23)
CO2: 27 mmol/L (ref 22–32)
CO2: 27 mmol/L (ref 22–32)
Calcium: 8.8 mg/dL — ABNORMAL LOW (ref 8.9–10.3)
Calcium: 8.9 mg/dL (ref 8.9–10.3)
Chloride: 98 mmol/L (ref 98–111)
Chloride: 98 mmol/L (ref 98–111)
Creatinine, Ser: 0.44 mg/dL (ref 0.44–1.00)
Creatinine, Ser: 0.44 mg/dL (ref 0.44–1.00)
GFR, Estimated: >60 mL/min (ref >60)
GFR, Estimated: >60 mL/min (ref >60)
Glucose, Bld: 197 mg/dL — ABNORMAL HIGH (ref 70–99)
Glucose, Bld: 148 mg/dL — ABNORMAL HIGH (ref 70–99)
Potassium: 4 mmol/L (ref 3.5–5.1)
Potassium: 3.8 mmol/L (ref 3.5–5.1)
Sodium: 131 mmol/L — ABNORMAL LOW (ref 135–145)
Sodium: 133 mmol/L — ABNORMAL LOW (ref 135–145)

## 2020-10-12 NOTE — Anesthesia Postprocedure Evaluation (Signed)
Anesthesia Post Note  Patient: ALVETA QUINTELA  Procedure(s) Performed: ARTHROPLASTY BIPOLAR HIP (HEMIARTHROPLASTY) ANTERIOR APPROACH (Left Hip)     Patient location during evaluation: PACU Anesthesia Type: General Level of consciousness: oriented and awake and alert Pain management: pain level controlled Vital Signs Assessment: post-procedure vital signs reviewed and stable Respiratory status: spontaneous breathing and respiratory function stable Cardiovascular status: blood pressure returned to baseline and stable Postop Assessment: no headache, no backache, no apparent nausea or vomiting and patient able to bend at knees Anesthetic complications: no   No complications documented.  Last Vitals:  Vitals:   10/12/20 0139 10/12/20 0553  BP: (!) 144/73 (!) 161/64  Pulse: 70 66  Resp: 16 16  Temp: 37.2 C 36.6 C  SpO2: 99% 100%    Last Pain:  Vitals:   10/12/20 0749  TempSrc:   PainSc: 0-No pain                 Merlinda Frederick

## 2020-10-12 NOTE — Progress Notes (Signed)
[facility]  PROGRESS NOTE  Alicia Boyd BHA:[id number] DOB: July 23, 1930 DOA: 10/10/2020 PCP: [name], MD   Brief HPI:   86 year old female with history of osteoporosis, prior right femoral neck fracture, hypertension was sent from orthopedic surgery clinic for finding of left femoral neck fracture.  As per the patient 3 weeks ago she noted a sudden pop involving left hip while ambulating down the hallway with her friend at facility resulting in joint pain on ambulation and movement.  Initial x-ray was negative for acute fracture.  Patient was tried multiple and this without success.  Repeat x-ray showed finding of femoral neck fracture.    Subjective   Patient seen and examined, s/p left hemiarthroplasty.  Denies pain.   Assessment/Plan:     1. Left femoral neck fracture-s/p left hemiarthroplasty.  Orthopedic surgery following.  Pain well controlled.   2. Hypertension-blood pressure stable, continue metoprolol, Norvasc, Lasix per home regimen. 3. Hypokalemia-replete 4. GERD-continue PPI 5. Osteoporosis-outpatient BMD noted to have T score of -4.6 on 09/19/2020.  Patient had not tolerated Fosamax due to dysphagia in the past.  Plan was to start Prolia however patient was in process of deciding if she wants to pursue the regimen or not.     COVID-19 Labs  No results for input(s): DDIMER, FERRITIN, LDH, CRP in the last 72 hours.  Lab Results  Component Value Date   SARSCOV2NAA NEGATIVE 10/10/2020   SARSCOV2NAA NEGATIVE 10/03/2019   [place] NEGATIVE 09/07/2019   [place] NEGATIVE 09/03/2019     Scheduled medications:    amLODipine  5 mg Oral Daily   aspirin EC  325 mg Oral BID PC   docusate sodium  100 mg Oral BID   ferrous sulfate  325 mg Oral BID WC   furosemide  40 mg Oral Daily   lactose free nutrition  237 mL Oral Daily   metoprolol succinate  37.5 mg Oral Daily   pantoprazole  40 mg Oral Daily         CBG: No results for  input(s): GLUCAP in the last 168 hours.  SpO2: 95 % O2 Flow Rate (L/min): 2 L/min    CBC: Recent Labs  Lab 10/10/20 1806 10/11/20 0340 10/12/20 0656  WBC 8.9 7.4 12.1*  NEUTROABS 7.4  --   --   HGB 13.3 11.5* 10.8*  HCT 40.9 34.9* 32.7*  MCV 94.9 92.6 94.2  PLT 353 284 973    Basic Metabolic Panel: Recent Labs  Lab 10/10/20 1806 10/11/20 0340 10/12/20 0020 10/12/20 0656  NA 136 135 131* 133*  K 3.4* 3.3* 4.0 3.8  CL 98 99 98 98  CO2 28 27 27 27   GLUCOSE 103* 103* 197* 148*  BUN 16 19 16 12   CREATININE 0.46 0.47 0.44 0.44  CALCIUM 9.6 9.0 8.8* 8.9     Liver Function Tests: Recent Labs  Lab 10/10/20 1806  AST 14*  ALT 16  ALKPHOS 114  BILITOT 0.6  PROT 6.5  ALBUMIN 4.0     Antibiotics: Anti-infectives (From admission, onward)   Start     Dose/Rate Route Frequency Ordered Stop   10/12/20 0600  vancomycin (VANCOCIN) IVPB 1000 mg/200 mL premix        1,000 mg 200 mL/hr over 60 Minutes Intravenous On call to O.R. 10/11/20 1144 10/12/20 0711   10/11/20 2300  vancomycin (VANCOCIN) IVPB 1000 mg/200 mL premix        1,000 mg 200 mL/hr over 60 Minutes Intravenous  Once 10/11/20 1559  10/12/20 0034       DVT prophylaxis: Lovenox  Code Status: DNR  Family Communication: No family at bedside    Status is: Inpatient  Dispo: The patient is from: Home              Anticipated d/c is to: Skilled nursing facility              Anticipated d/c date is: 10/13/2020              Patient currently not medically stable for discharge  Barrier to discharge-awaiting bed at nursing facility      Consultants:  Orthopedics  Procedures:     Objective   Vitals:   10/12/20 0139 10/12/20 0553 10/12/20 0848 10/12/20 0936  BP: (!) 144/73 (!) 161/64  (!) 142/56  Pulse: 70 66 70 75  Resp: 16 16  18   Temp: 99 F (37.2 C) 97.8 F (36.6 C)  (!) 97.5 F (36.4 C)  TempSrc:    Oral  SpO2: 99% 100% 98% 95%  Weight:      Height:        Intake/Output  Summary (Last 24 hours) at 10/12/2020 1333 Last data filed at 10/12/2020 1332 Gross per 24 hour  Intake 3480.56 ml  Output 1775 ml  Net 1705.56 ml    10/12 1901 - 10/14 0700 In: 3370.6 [P.O.:950; I.V.:2320.6] Out: 3325 [Urine:3100]  Filed Weights   10/10/20 1815  Weight: 55.3 kg    Physical Examination:   General-appears in no acute distress Heart-S1-S2, regular, no murmur auscultated Lungs-clear to auscultation bilaterally, no wheezing or crackles auscultated Abdomen-soft, nontender, no organomegaly Extremities-no edema in the lower extremities Neuro-alert, oriented x3, no focal deficit noted   Data Reviewed:   Recent Results (from the past 240 hour(s))  Respiratory Panel by RT PCR (Flu A&B, Covid) - Nasopharyngeal Swab     Status: None   Collection Time: 10/10/20  7:10 PM   Specimen: Nasopharyngeal Swab  Result Value Ref Range Status   SARS Coronavirus 2 by RT PCR NEGATIVE NEGATIVE Final    Comment: (NOTE) SARS-CoV-2 target nucleic acids are NOT DETECTED.  The SARS-CoV-2 RNA is generally detectable in upper respiratoy specimens during the acute phase of infection. The lowest concentration of SARS-CoV-2 viral copies this assay can detect is 131 copies/mL. A negative result does not preclude SARS-Cov-2 infection and should not be used as the sole basis for treatment or other patient management decisions. A negative result may occur with  improper specimen collection/handling, submission of specimen other than nasopharyngeal swab, presence of viral mutation(s) within the areas targeted by this assay, and inadequate number of viral copies (<131 copies/mL). A negative result must be combined with clinical observations, patient history, and epidemiological information. The expected result is Negative.  Fact Sheet for Patients:  PinkCheek.be  Fact Sheet for Healthcare Providers:  GravelBags.it  This test is  no t yet approved or cleared by the [place] FDA and  has been authorized for detection and/or diagnosis of SARS-CoV-2 by FDA under an Emergency Use Authorization (EUA). This EUA will remain  in effect (meaning this test can be used) for the duration of the COVID-19 declaration under Section 564(b)(1) of the Act, 21 U.S.C. section 360bbb-3(b)(1), unless the authorization is terminated or revoked sooner.     Influenza A by PCR NEGATIVE NEGATIVE Final   Influenza B by PCR NEGATIVE NEGATIVE Final    Comment: (NOTE) The Xpert Xpress SARS-CoV-2/FLU/RSV assay is intended as an aid in  the diagnosis of influenza from Nasopharyngeal swab specimens and  should not be used as a sole basis for treatment. Nasal washings and  aspirates are unacceptable for Xpert Xpress SARS-CoV-2/FLU/RSV  testing.  Fact Sheet for Patients: PinkCheek.be  Fact Sheet for Healthcare Providers: GravelBags.it  This test is not yet approved or cleared by the [place] FDA and  has been authorized for detection and/or diagnosis of SARS-CoV-2 by  FDA under an Emergency Use Authorization (EUA). This EUA will remain  in effect (meaning this test can be used) for the duration of the  Covid-19 declaration under Section 564(b)(1) of the Act, 21  U.S.C. section 360bbb-3(b)(1), unless the authorization is  terminated or revoked. Performed at [facility], [place] [address] [place], [place] [address]   MRSA PCR Screening     Status: None   Collection Time: 10/11/20  1:54 AM   Specimen: Nasal Mucosa; Nasopharyngeal  Result Value Ref Range Status   MRSA by PCR NEGATIVE NEGATIVE Final    Comment:        The GeneXpert MRSA Assay (FDA approved for NASAL specimens only), is one component of a comprehensive MRSA colonization surveillance program. It is not intended to diagnose MRSA infection nor to guide or monitor treatment for MRSA  infections. Performed at [facility], [place] [address], [place], [place] [address]     No results for input(s): LIPASE, AMYLASE in the last 168 hours. No results for input(s): AMMONIA in the last 168 hours.  Cardiac Enzymes: No results for input(s): CKTOTAL, CKMB, CKMBINDEX, TROPONINI in the last 168 hours. BNP (last 3 results) No results for input(s): BNP in the last 8760 hours.  ProBNP (last 3 results) No results for input(s): PROBNP in the last 8760 hours.  Studies:  Pelvis Portable  Result Date: 10/11/2020 CLINICAL DATA:  Left hip hemiarthroplasty EXAM: PORTABLE PELVIS 1-2 VIEWS COMPARISON:  09/03/2019 pelvic intraoperative radiographs FINDINGS: Interval left hip hemiarthroplasty. Prior right hip hemiarthroplasty. No evidence of hardware fracture. No acute osseous fracture. No evidence of hip dislocation on this single frontal view. Expected gas surrounding the left hip joint. IMPRESSION: Satisfactory immediate postoperative frontal view appearance status post left hip hemiarthroplasty. Electronically Signed   By: Ilona Sorrel M.D.   On: 10/11/2020 16:59   Chest Portable 1 View  Result Date: 10/10/2020 CLINICAL DATA:  Preoperative radiograph EXAM: PORTABLE CHEST 1 VIEW COMPARISON:  Radiograph 11/03/2019 FINDINGS: Calcified granuloma in the right lung with additional calcified right hilar lymph node compatible sequela of prior granulomatous disease unchanged from comparison imaging. Some chronically coarsened interstitial and bronchitic features are also similar to comparison. No consolidation, features of edema, pneumothorax, or effusion. The aorta is calcified. The remaining cardiomediastinal contours are unremarkable. No acute osseous or soft tissue abnormality. Degenerative changes are present in the imaged spine and shoulders. IMPRESSION: 1. No acute cardiopulmonary abnormality. 2. Chronically coarsened interstitial and bronchitic features, similar to comparison  imaging. 3. Right lung granuloma and calcified right hilar lymph node compatible with sequela of prior granulomatous disease. 4.  Aortic Atherosclerosis (ICD10-I70.0). Electronically Signed   By: Lovena Le M.D.   On: 10/10/2020 19:02   DG C-Arm 1-60 Min-No Report  Result Date: 10/11/2020 CLINICAL DATA:  Left hip arthroplasty EXAM: OPERATIVE LEFT HIP WITH PELVIS; DG C-ARM 1-60 MIN-NO REPORT COMPARISON:  Fine 420 intraoperative pelvic radiographs FLUOROSCOPY TIME:  Fluoroscopy Time:  0 minutes 10 seconds Number of Acquired Spot Images: 2 FINDINGS: Nondiagnostic spot fluoroscopic intraoperative of left hip radiographs  demonstrate interval left hip hemiarthroplasty with no appreciable hip dislocation on these views. IMPRESSION: Intraoperative fluoroscopic guidance for left hip hemiarthroplasty. Electronically Signed   By: Ilona Sorrel M.D.   On: 10/11/2020 16:58   DG HIP OPERATIVE UNILAT W OR W/O PELVIS LEFT  Result Date: 10/11/2020 CLINICAL DATA:  Left hip arthroplasty EXAM: OPERATIVE LEFT HIP WITH PELVIS; DG C-ARM 1-60 MIN-NO REPORT COMPARISON:  Fine 420 intraoperative pelvic radiographs FLUOROSCOPY TIME:  Fluoroscopy Time:  0 minutes 10 seconds Number of Acquired Spot Images: 2 FINDINGS: Nondiagnostic spot fluoroscopic intraoperative of left hip radiographs demonstrate interval left hip hemiarthroplasty with no appreciable hip dislocation on these views. IMPRESSION: Intraoperative fluoroscopic guidance for left hip hemiarthroplasty. Electronically Signed   By: Ilona Sorrel M.D.   On: 10/11/2020 16:58       [place]   [facility] If 7PM-7AM, please contact night-coverage at [email or web address], Office  [phone]   10/12/2020, 1:33 PM  LOS: 2 days

## 2020-10-12 NOTE — Evaluation (Signed)
Physical Therapy Evaluation Patient Details Name: Alicia Boyd MRN: [id number] DOB: May 29, 1930 Today's Date: 10/12/2020   History of Present Illness  Pt s/p fall with L hip fx and now s/p hemi-arthroplasty by anterior direct approach.  Pt with hx of PVD, osteoporosis, and R hip fx 9/20  Clinical Impression  Pt admitted as above and presenting with functional mobility limitations 2* generalized weakness, decreased L LE strength/ROM and post op pain.  Pt plans dc to rehab setting at [facility].    Follow Up Recommendations SNF    Equipment Recommendations  None recommended by PT    Recommendations for Other Services       Precautions / Restrictions Precautions Precautions: Fall Restrictions Weight Bearing Restrictions: No LLE Weight Bearing: Weight bearing as tolerated      Mobility  Bed Mobility Overal bed mobility: Needs Assistance Bed Mobility: Supine to Sit     Supine to sit: Min assist;Mod assist;+2 for physical assistance;+2 for safety/equipment     General bed mobility comments: cues for sequence and use of R LE to self assist  Transfers Overall transfer level: Needs assistance Equipment used: Rolling walker (2 wheeled) Transfers: Sit to/from Stand Sit to Stand: Min assist;Mod assist;+2 physical assistance;+2 safety/equipment;From elevated surface         General transfer comment: cues for LE management and use of UEs to self assist  Ambulation/Gait Ambulation/Gait assistance: +2 physical assistance;+2 safety/equipment;Min assist;Mod assist Gait Distance (Feet): 3 Feet Assistive device: Rolling walker (2 wheeled) Gait Pattern/deviations: Step-to pattern;Decreased step length - right;Decreased step length - left;Shuffle;Trunk flexed Gait velocity: decr   General Gait Details: cues for sequence, posture, position from RW and to place wt on L LE:  Distance [employer] by [employer]    Modified Rankin (Stroke  Patients Only)       Balance Overall balance assessment: Needs assistance Sitting-balance support: No upper extremity supported;Feet supported Sitting balance-Leahy Scale: Fair     Standing balance support: Bilateral upper extremity supported Standing balance-Leahy Scale: Poor                               Pertinent Vitals/Pain Pain Assessment: 0-10 Pain Score: 5  Pain Location: L hip Pain Descriptors / Indicators: Aching;Sore Pain Intervention(s): Limited activity within patient's tolerance;Monitored during session;Premedicated before session;Ice applied    Home Living Family/patient expects to be discharged to:: Skilled nursing facility                 Additional Comments: [facility] rehab    Prior Function Level of Independence: Independent with assistive device(s)         Comments: Pt in IND living     Hand Dominance        Extremity/Trunk Assessment   Upper Extremity Assessment Upper Extremity Assessment: Generalized weakness    Lower Extremity Assessment Lower Extremity Assessment: LLE deficits/detail LLE Deficits / Details: AAROM at hip to 15 abd and 80 flex; strength at hip 2/5       Communication   Communication: No difficulties  Cognition Arousal/Alertness: Awake/alert Behavior During Therapy: WFL for tasks assessed/performed Overall Cognitive Status: Within Functional Limits for tasks assessed  General Comments      Exercises Total Joint Exercises Ankle Circles/Pumps: AROM;Both;15 reps;Supine Quad Sets: AROM;Both;10 reps;Supine Heel Slides: AAROM;Left;15 reps;Supine Hip ABduction/ADduction: AAROM;Left;15 reps;Supine   Assessment/Plan    PT Assessment Patient needs continued PT services  PT Problem List Decreased strength;Decreased range of motion;Decreased activity tolerance;Decreased balance;Decreased mobility;Decreased knowledge of use of DME;Pain        PT Treatment Interventions DME instruction;Gait training;Stair training;Functional mobility training;Therapeutic activities;Therapeutic exercise;Patient/family education;Balance training    PT Goals (Current goals can be found in the Care Plan section)  Acute Rehab PT Goals Patient Stated Goal: Regain IND  PT Goal Formulation: With patient Time For Goal Achievement: 10/26/20 Potential to Achieve Goals: Good    Frequency Min 5X/week   Barriers to discharge        Co-evaluation               AM-PAC PT "6 Clicks" Mobility  Outcome Measure Help needed turning from your back to your side while in a flat bed without using bedrails?: A Lot Help needed moving from lying on your back to sitting on the side of a flat bed without using bedrails?: A Lot Help needed moving to and from a bed to a chair (including a wheelchair)?: A Lot Help needed standing up from a chair using your arms (e.g., wheelchair or bedside chair)?: A Lot Help needed to walk in hospital room?: A Lot Help needed climbing 3-5 steps with a railing? : A Lot 6 Click Score: 12    End of Session Equipment Utilized During Treatment: Gait belt Activity Tolerance: Patient tolerated treatment well Patient left: in chair;with call bell/phone within reach;with chair alarm set Nurse Communication: Mobility status PT Visit Diagnosis: Difficulty in walking, not elsewhere classified (R26.2)    Time: 0981-1914 PT Time Calculation (min) (ACUTE ONLY): 27 min   Charges:   PT Evaluation $PT Eval Low Complexity: 1 Low PT Treatments $Therapeutic Exercise: 8-22 mins        Debe Coder PT Acute Rehabilitation Services Pager [phone] Office [phone]   Etha Stambaugh 10/12/2020, 12:29 PM

## 2020-10-12 NOTE — Plan of Care (Signed)
  Problem: Education: Goal: Knowledge of General Education information will improve Description: Including pain rating scale, medication(s)/side effects and non-pharmacologic comfort measures Outcome: Progressing   Problem: Health Behavior/Discharge Planning: Goal: Ability to manage health-related needs will improve Outcome: Progressing   Problem: Clinical Measurements: Goal: Ability to maintain clinical measurements within normal limits will improve Outcome: Progressing Goal: Will remain free from infection Outcome: Progressing Goal: Diagnostic test results will improve Outcome: Progressing Goal: Respiratory complications will improve Outcome: Progressing Goal: Cardiovascular complication will be avoided Outcome: Progressing   Problem: Activity: Goal: Risk for activity intolerance will decrease Outcome: Progressing   Problem: Coping: Goal: Level of anxiety will decrease Outcome: Progressing   Problem: Elimination: Goal: Will not experience complications related to bowel motility Outcome: Progressing   Problem: Pain Managment: Goal: General experience of comfort will improve Outcome: Progressing   Problem: Safety: Goal: Ability to remain free from injury will improve Outcome: Progressing   Problem: Skin Integrity: Goal: Risk for impaired skin integrity will decrease Outcome: Progressing   Problem: Education: Goal: Verbalization of understanding the information provided (i.e., activity precautions, restrictions, etc) will improve Outcome: Progressing   Problem: Activity: Goal: Ability to ambulate and perform ADLs will improve Outcome: Progressing   Problem: Clinical Measurements: Goal: Postoperative complications will be avoided or minimized Outcome: Progressing   Problem: Self-Concept: Goal: Ability to maintain and perform role responsibilities to the fullest extent possible will improve Outcome: Progressing   Problem: Pain Management: Goal: Pain level  will decrease Outcome: Progressing

## 2020-10-12 NOTE — Progress Notes (Signed)
Physical Therapy Treatment Patient Details Name: Alicia Boyd MRN: [id number] DOB: [birth year] Today's Date: 10/12/2020    History of Present Illness Pt s/p fall with L hip fx and now s/p hemi-arthroplasty by anterior direct approach.  Pt with hx of PVD, osteoporosis, and R hip fx 9/20    PT Comments    Pt continues cooperative and with no c/o nausea/dizziness but ltd by fatigue and with notably increased memory deficits this session - pt repeatedly asking same questions.   Follow Up Recommendations  SNF     Equipment Recommendations  None recommended by PT    Recommendations for Other Services       Precautions / Restrictions Precautions Precautions: Fall Restrictions Weight Bearing Restrictions: No LLE Weight Bearing: Weight bearing as tolerated    Mobility  Bed Mobility Overal bed mobility: Needs Assistance Bed Mobility: Sit to Supine     Supine to sit: Min assist;Mod assist;+2 for physical assistance;+2 for safety/equipment Sit to supine: Mod assist;+2 for physical assistance;+2 for safety/equipment   General bed mobility comments: cues for sequence and use of R LE to self assist:  Physical assist to manage LEs onto bed and to control trunk  Transfers Overall transfer level: Needs assistance Equipment used: Rolling walker (2 wheeled) Transfers: Sit to/from Stand Sit to Stand: Min assist;Mod assist;+2 physical assistance;+2 safety/equipment;From elevated surface         General transfer comment: cues for LE management and use of UEs to self assist  Ambulation/Gait Ambulation/Gait assistance: +2 physical assistance;+2 safety/equipment;Min assist;Mod assist Gait Distance (Feet): 3 Feet Assistive device: Rolling walker (2 wheeled) Gait Pattern/deviations: Step-to pattern;Decreased step length - right;Decreased step length - left;Shuffle;Trunk flexed Gait velocity: decr   General Gait Details: cues for sequence, posture, position from RW and to place wt on  L LE:  Distance ltd by [employer]    Modified Rankin (Stroke Patients Only)       Balance Overall balance assessment: Needs assistance Sitting-balance support: No upper extremity supported;Feet supported Sitting balance-Leahy Scale: Fair     Standing balance support: Bilateral upper extremity supported Standing balance-Leahy Scale: Poor                              Cognition Arousal/Alertness: Awake/alert Behavior During Therapy: WFL for tasks assessed/performed Overall Cognitive Status: Within Functional Limits for tasks assessed                                 General Comments: Pt appears more forgetful this pm      Exercises Total Joint Exercises Ankle Circles/Pumps: AROM;Both;15 reps;Supine Quad Sets: AROM;Both;10 reps;Supine Heel Slides: AAROM;Left;15 reps;Supine Hip ABduction/ADduction: AAROM;Left;15 reps;Supine    General Comments        Pertinent Vitals/Pain Pain Assessment: Faces Pain Score: 5  Faces Pain Scale: Hurts little more Pain Location: L hip Pain Descriptors / Indicators: Aching;Sore Pain Intervention(s): Limited activity within patient's tolerance;Monitored during session;Premedicated before session;Ice applied    Home Living Family/patient expects to be discharged to:: Skilled nursing facility               Additional Comments: [facility] rehab    Prior Function Level of Independence: Independent with assistive device(s)      Comments: Pt in IND living   PT Goals (current  goals can now be found in the care plan section) Acute Rehab PT Goals Patient Stated Goal: Regain IND  PT Goal Formulation: With patient Time For Goal Achievement: 10/26/20 Potential to Achieve Goals: Good Progress towards PT goals: Progressing toward goals    Frequency    Min 5X/week      PT Plan Current plan remains appropriate    Co-evaluation              AM-PAC PT  "6 Clicks" Mobility   Outcome Measure  Help needed turning from your back to your side while in a flat bed without using bedrails?: A Lot Help needed moving from lying on your back to sitting on the side of a flat bed without using bedrails?: A Lot Help needed moving to and from a bed to a chair (including a wheelchair)?: A Lot Help needed standing up from a chair using your arms (e.g., wheelchair or bedside chair)?: A Lot Help needed to walk in hospital room?: A Lot Help needed climbing 3-5 steps with a railing? : A Lot 6 Click Score: 12    End of Session Equipment Utilized During Treatment: Gait belt Activity Tolerance: Patient limited by fatigue Patient left: in bed;with call bell/phone within reach;with bed alarm set;with nursing/sitter in room Nurse Communication: Mobility status PT Visit Diagnosis: Difficulty in walking, not elsewhere classified (R26.2)     Time: 4166-0630 PT Time Calculation (min) (ACUTE ONLY): 18 min  Charges:  $Gait Training: 8-22 mins $Therapeutic Exercise: 8-22 mins                     Debe Coder PT Acute Rehabilitation Services Pager [phone] Office [phone]    Lalo Tromp 10/12/2020, 3:17 PM

## 2020-10-12 NOTE — Plan of Care (Signed)
Plan of care reviewed and discussed with the patient. 

## 2020-10-12 NOTE — NC FL2 (Signed)
[place] LEVEL OF CARE SCREENING TOOL     IDENTIFICATION  Patient Name: Alicia Boyd Birthdate: Mar 03, 1930 Sex: female Admission Date (Current Location): 10/10/2020  [facility] and [place] Number:  Herbalist and Address:  [facility],  [place] [address] [place]      Provider Number: [id number]  Attending Physician Name and Address:  [name], MD  Relative Name and Phone Number:  friend, [name] @ [phone]    Current Level of Care: Hospital Recommended Level of Care: [place] Prior Approval Number:    Date Approved/Denied:   PASRR Number: [id number]  Discharge Plan: SNF    Current Diagnoses: Patient Active Problem List   Diagnosis Date Noted  . S/P hip hemiarthroplasty 10/11/2020  . Displaced fracture of left femoral neck ([place]) 10/10/2020  . Left displaced femoral neck fracture ([place]) 10/10/2020  . DNR (do not resuscitate) discussion 10/10/2020  . Nondisplaced fracture of body of right calcaneus, initial encounter for closed fracture 08/30/2020  . Left knee pain 06/27/2020  . Contact dermatitis 06/19/2020  . Left hip pain 05/10/2020  . Unsteady gait 05/10/2020  . Bilateral pleural effusion 10/03/2019  . Hypokalemia 10/03/2019  . Malnutrition of moderate degree 09/08/2019  . Displaced fracture of right femoral neck ([place]) 09/03/2019  . Failure to thrive in adult 09/01/2019  . Quadriceps strain, right, initial encounter 09/01/2019  . Anxiety and depression 07/05/2019  . Greater trochanteric bursitis of left hip 06/22/2019  . Closed fracture of multiple pubic rami, right, initial encounter ([place]) 05/28/2019  . Greater trochanteric bursitis of right hip 01/05/2019  . Hypertensive retinopathy, bilateral 04/29/2018  . Nuclear sclerosis of both eyes 04/29/2018  . Degenerative arthritis of left knee 04/16/2018  . Mild carpal tunnel syndrome of right wrist 09/24/2017  . Ecchymoses, spontaneous  07/29/2016  . Varicose veins 07/29/2016  . Nasal congestion 07/29/2016  . Hyperglycemia 07/12/2015  . Retinal hemorrhage, right 06/06/2014  . Paresthesia of hand 03/22/2014  . Undiagnosed cardiac murmurs 03/22/2014  . Cataract 04/10/2012  . Ocular migraine 04/10/2012  . Palpitations 03/03/2012  . ANEMIA, MILD 07/27/2010  . Venous (peripheral) insufficiency 07/27/2010  . OTHER CONSTIPATION 04/17/2010  . Essential hypertension 04/06/2010  . RHINOCONJUNCTIVITIS, ALLERGIC 04/04/2010  . PERSONAL HISTORY OTHER DISORDER URINARY SYSTEM 01/25/2010  . OTHER DYSPHAGIA 04/11/2009  . Esophageal reflux 01/24/2009  . OTHER AND UNSPECIFIED HYPERLIPIDEMIA 11/23/2008  . Osteoporosis 11/23/2008  . NONSPECIFIC ABNORMAL ELECTROCARDIOGRAM 11/23/2008    Orientation RESPIRATION BLADDER Height & Weight     Self, Time, Situation, Place  Normal Continent Weight: 122 lb (55.3 kg) Height:  5\' 4"  (162.6 cm)  BEHAVIORAL SYMPTOMS/MOOD NEUROLOGICAL BOWEL NUTRITION STATUS      Continent    AMBULATORY STATUS COMMUNICATION OF NEEDS Skin   Limited Assist Verbally Surgical wounds                       Personal Care Assistance Level of Assistance  Bathing, Dressing Bathing Assistance: Limited assistance   Dressing Assistance: Limited assistance     Functional Limitations Info             SPECIAL CARE FACTORS FREQUENCY  PT (By licensed PT), OT (By licensed OT)     PT Frequency: 5x/wk OT Frequency: 5x/wk            Contractures Contractures Info: Not present    Additional Factors Info  Code Status, Allergies Code Status Info: DNR Allergies Info: see MAR  Current Medications (10/12/2020):  This is the current hospital active medication list Current Facility-Administered Medications  Medication Dose Route Frequency Provider Last Rate Last Admin  . acetaminophen (TYLENOL) tablet 325-650 mg  325-650 mg Oral Q6H PRN Dereck Leep, PA-C   325 mg at 10/12/20 7341  . alum &  mag hydroxide-simeth (MAALOX/MYLANTA) 200-200-20 MG/5ML suspension 30 mL  30 mL Oral Q4H PRN Dereck Leep, PA-C      . amLODipine (NORVASC) tablet 5 mg  5 mg Oral Daily Donne Hazel, MD      . aspirin EC tablet 325 mg  325 mg Oral BID Alyce Pagan, MD   325 mg at 10/12/20 0842  . dextrose 5 %-0.45 % sodium chloride infusion   Intravenous Continuous Dereck Leep, PA-C 75 mL/hr at 10/11/20 1750 New Bag at 10/11/20 1750  . docusate sodium (COLACE) capsule 100 mg  100 mg Oral BID Donne Hazel, MD   100 mg at 10/12/20 0842  . ferrous sulfate tablet 325 mg  325 mg Oral BID WC Dereck Leep, PA-C      . furosemide (LASIX) tablet 40 mg  40 mg Oral Daily Donne Hazel, MD   40 mg at 10/10/20 2026  . HYDROcodone-acetaminophen (NORCO/VICODIN) 5-325 MG per tablet 1-2 tablet  1-2 tablet Oral Q6H PRN Eudelia Bunch, RPH   1 tablet at 10/12/20 1258  . HYDROmorphone (DILAUDID) injection 0.5-1 mg  0.5-1 mg Intravenous Q4H PRN Dereck Leep, PA-C      . lactose free nutrition (BOOST PLUS) liquid 237 mL  237 mL Oral Daily Donne Hazel, MD   237 mL at 10/12/20 1427  . metoCLOPramide (REGLAN) tablet 5-10 mg  5-10 mg Oral Q8H PRN Dereck Leep, PA-C       Or  . metoCLOPramide (REGLAN) injection 5-10 mg  5-10 mg Intravenous Q8H PRN Dereck Leep, PA-C      . metoprolol succinate (TOPROL-XL) 24 hr tablet 37.5 mg  37.5 mg Oral Daily Donne Hazel, MD   37.5 mg at 10/12/20 0842  . morphine 2 MG/ML injection 0.5 mg  0.5 mg Intravenous Q2H PRN Donne Hazel, MD   0.5 mg at 10/11/20 0220  . ondansetron (ZOFRAN) tablet 4 mg  4 mg Oral Q6H PRN Dereck Leep, PA-C       Or  . ondansetron [facility]) injection 4 mg  4 mg Intravenous Q6H PRN Dereck Leep, PA-C      . oxyCODONE (Oxy IR/ROXICODONE) immediate release tablet 10-15 mg  10-15 mg Oral Q4H PRN Dereck Leep, PA-C      . oxyCODONE (Oxy IR/ROXICODONE) immediate release tablet 5-10 mg  5-10 mg Oral Q4H PRN Dereck Leep, PA-C       . pantoprazole (PROTONIX) EC tablet 40 mg  40 mg Oral Daily Donne Hazel, MD      . polyethylene glycol (MIRALAX / GLYCOLAX) packet 17 g  17 g Oral Daily PRN Donne Hazel, MD         Discharge Medications: Please see discharge summary for a list of discharge medications.  Relevant Imaging Results:  Relevant Lab Results:   Additional Information SS# [id number]  Lennart Pall, LCSW

## 2020-10-12 NOTE — Progress Notes (Signed)
Subjective: 1 Day Post-Op Procedure(s) (LRB): ARTHROPLASTY BIPOLAR HIP (HEMIARTHROPLASTY) ANTERIOR APPROACH (Left) Patient reports pain as mild    Objective: Vital signs in last 24 hours: Temp:  [97.7 F (36.5 C)-99.1 F (37.3 C)] 97.8 F (36.6 C) (10/14 0553) Pulse Rate:  [66-80] 66 (10/14 0553) Resp:  [12-20] 16 (10/14 0553) BP: (112-172)/(52-106) 161/64 (10/14 0553) SpO2:  [97 %-100 %] 100 % (10/14 0553)  Intake/Output from previous day: 10/13 0701 - 10/14 0700 In: 3130.6 [P.O.:710; I.V.:2320.6; IV Piggyback:100] Out: 1875 [Urine:1650; Blood:225] Intake/Output this shift: No intake/output data recorded.  Recent Labs    10/10/20 1806 10/11/20 0340 10/12/20 0656  HGB 13.3 11.5* 10.8*   Recent Labs    10/11/20 0340 10/12/20 0656  WBC 7.4 12.1*  RBC 3.77* 3.47*  HCT 34.9* 32.7*  PLT 284 287   Recent Labs    10/11/20 0340 10/12/20 0020  NA 135 131*  K 3.3* 4.0  CL 99 98  CO2 27 27  BUN 19 16  CREATININE 0.47 0.44  GLUCOSE 103* 197*  CALCIUM 9.0 8.8*   Recent Labs    10/10/20 1806 10/11/20 1137  INR 1.0 1.0    Neurologically intact ABD soft Neurovascular intact Sensation intact distally Intact pulses distally No cellulitis present Compartment soft   Assessment/Plan: 1 Day Post-Op Procedure(s) (LRB): ARTHROPLASTY BIPOLAR HIP (HEMIARTHROPLASTY) ANTERIOR APPROACH (Left) Advance diet Up with therapy Discharge to SNF [facility] and [facility] to their skilled unit   PT DOES NOT NEED KNEE IMMOBILIZER AS SHE WAS DONE FROM ANTERIOR APPROACH.   Alta Corning 10/12/2020, 8:18 AM

## 2020-10-13 DIAGNOSIS — M6281 Muscle weakness (generalized): Secondary | ICD-10-CM | POA: Diagnosis not present

## 2020-10-13 DIAGNOSIS — R1314 Dysphagia, pharyngoesophageal phase: Secondary | ICD-10-CM | POA: Diagnosis not present

## 2020-10-13 DIAGNOSIS — R635 Abnormal weight gain: Secondary | ICD-10-CM | POA: Diagnosis not present

## 2020-10-13 DIAGNOSIS — M25552 Pain in left hip: Secondary | ICD-10-CM | POA: Diagnosis not present

## 2020-10-13 DIAGNOSIS — E785 Hyperlipidemia, unspecified: Secondary | ICD-10-CM | POA: Diagnosis not present

## 2020-10-13 DIAGNOSIS — M2550 Pain in unspecified joint: Secondary | ICD-10-CM | POA: Diagnosis not present

## 2020-10-13 DIAGNOSIS — R2681 Unsteadiness on feet: Secondary | ICD-10-CM | POA: Diagnosis not present

## 2020-10-13 DIAGNOSIS — K5901 Slow transit constipation: Secondary | ICD-10-CM | POA: Diagnosis not present

## 2020-10-13 DIAGNOSIS — W19XXXA Unspecified fall, initial encounter: Secondary | ICD-10-CM | POA: Diagnosis not present

## 2020-10-13 DIAGNOSIS — L03116 Cellulitis of left lower limb: Secondary | ICD-10-CM | POA: Diagnosis not present

## 2020-10-13 DIAGNOSIS — M7989 Other specified soft tissue disorders: Secondary | ICD-10-CM | POA: Diagnosis not present

## 2020-10-13 DIAGNOSIS — S72002A Fracture of unspecified part of neck of left femur, initial encounter for closed fracture: Secondary | ICD-10-CM | POA: Diagnosis not present

## 2020-10-13 DIAGNOSIS — S72002D Fracture of unspecified part of neck of left femur, subsequent encounter for closed fracture with routine healing: Secondary | ICD-10-CM | POA: Diagnosis not present

## 2020-10-13 DIAGNOSIS — K5909 Other constipation: Secondary | ICD-10-CM | POA: Diagnosis not present

## 2020-10-13 DIAGNOSIS — I5030 Unspecified diastolic (congestive) heart failure: Secondary | ICD-10-CM | POA: Diagnosis not present

## 2020-10-13 DIAGNOSIS — I10 Essential (primary) hypertension: Secondary | ICD-10-CM | POA: Diagnosis not present

## 2020-10-13 DIAGNOSIS — Z96642 Presence of left artificial hip joint: Secondary | ICD-10-CM | POA: Diagnosis not present

## 2020-10-13 DIAGNOSIS — K219 Gastro-esophageal reflux disease without esophagitis: Secondary | ICD-10-CM | POA: Diagnosis not present

## 2020-10-13 DIAGNOSIS — M79662 Pain in left lower leg: Secondary | ICD-10-CM | POA: Diagnosis not present

## 2020-10-13 DIAGNOSIS — Z96649 Presence of unspecified artificial hip joint: Secondary | ICD-10-CM

## 2020-10-13 DIAGNOSIS — R002 Palpitations: Secondary | ICD-10-CM | POA: Diagnosis not present

## 2020-10-13 DIAGNOSIS — F419 Anxiety disorder, unspecified: Secondary | ICD-10-CM | POA: Diagnosis not present

## 2020-10-13 DIAGNOSIS — F32A Depression, unspecified: Secondary | ICD-10-CM | POA: Diagnosis not present

## 2020-10-13 DIAGNOSIS — I872 Venous insufficiency (chronic) (peripheral): Secondary | ICD-10-CM | POA: Diagnosis not present

## 2020-10-13 DIAGNOSIS — R4181 Age-related cognitive decline: Secondary | ICD-10-CM | POA: Diagnosis not present

## 2020-10-13 DIAGNOSIS — D649 Anemia, unspecified: Secondary | ICD-10-CM | POA: Diagnosis not present

## 2020-10-13 DIAGNOSIS — J3089 Other allergic rhinitis: Secondary | ICD-10-CM | POA: Diagnosis not present

## 2020-10-13 DIAGNOSIS — Z7401 Bed confinement status: Secondary | ICD-10-CM | POA: Diagnosis not present

## 2020-10-13 DIAGNOSIS — R52 Pain, unspecified: Secondary | ICD-10-CM | POA: Diagnosis not present

## 2020-10-13 DIAGNOSIS — S72042A Displaced fracture of base of neck of left femur, initial encounter for closed fracture: Secondary | ICD-10-CM | POA: Diagnosis not present

## 2020-10-13 DIAGNOSIS — I5032 Chronic diastolic (congestive) heart failure: Secondary | ICD-10-CM | POA: Diagnosis not present

## 2020-10-13 DIAGNOSIS — Z515 Encounter for palliative care: Secondary | ICD-10-CM

## 2020-10-13 DIAGNOSIS — M810 Age-related osteoporosis without current pathological fracture: Secondary | ICD-10-CM | POA: Diagnosis not present

## 2020-10-13 DIAGNOSIS — R29898 Other symptoms and signs involving the musculoskeletal system: Secondary | ICD-10-CM | POA: Diagnosis not present

## 2020-10-13 LAB — CBC
HCT: 32.5 % — ABNORMAL LOW (ref 36.0–46.0)
Hemoglobin: 10.5 g/dL — ABNORMAL LOW (ref 12.0–15.0)
MCH: 30.3 pg (ref 26.0–34.0)
MCHC: 32.3 g/dL (ref 30.0–36.0)
MCV: 93.9 fL (ref 80.0–100.0)
Platelets: 273 10*3/uL (ref 150–400)
RBC: 3.46 MIL/uL — ABNORMAL LOW (ref 3.87–5.11)
RDW: 12.7 % (ref 11.5–15.5)
WBC: 11.1 10*3/uL — ABNORMAL HIGH (ref 4.0–10.5)
nRBC: 0 % (ref 0.0–0.2)

## 2020-10-13 LAB — RESPIRATORY PANEL BY RT PCR (FLU A&B, COVID)
Influenza A by PCR: NEGATIVE
Influenza B by PCR: NEGATIVE
SARS Coronavirus 2 by RT PCR: NEGATIVE

## 2020-10-13 MED ORDER — OXYCODONE HCL 5 MG PO TABS: 5.0000 mg | ORAL_TABLET | Freq: Three times a day (TID) | ORAL | 0 refills | Status: AC | PRN

## 2020-10-13 MED ORDER — DOCUSATE SODIUM 100 MG PO CAPS: 100.0000 mg | ORAL_CAPSULE | Freq: Two times a day (BID) | ORAL | 0 refills | Status: AC | PRN

## 2020-10-13 MED ORDER — ASPIRIN EC 81 MG PO TBEC: 162.0000 mg | DELAYED_RELEASE_TABLET | Freq: Two times a day (BID) | ORAL | 0 refills | Status: DC

## 2020-10-13 MED ORDER — BISACODYL 10 MG RE SUPP
10.0000 mg | Freq: Once | RECTAL | Status: AC
  Administered 2020-10-13: 10 mg via RECTAL
  Filled 2020-10-13: qty 1

## 2020-10-13 NOTE — Progress Notes (Signed)
Physical Therapy Treatment Patient Details Name: Alicia Boyd MRN: [id number] DOB: [birth year] Today's Date: 10/13/2020    History of Present Illness Pt s/p fall with L hip fx and now s/p hemi-arthroplasty by anterior direct approach.  Pt with hx of PVD, osteoporosis, and R hip fx 9/20    PT Comments    Pt cooperative and progressing slowly but steadily with mobility but continues to fatigue easily and limited by anxiety.  Pt reports possible dc to [facility] this date.   Follow Up Recommendations  SNF     Equipment Recommendations  None recommended by PT    Recommendations for Other Services       Precautions / Restrictions Precautions Precautions: Fall Restrictions Weight Bearing Restrictions: No LLE Weight Bearing: Weight bearing as tolerated    Mobility  Bed Mobility Overal bed mobility: Needs Assistance Bed Mobility: Supine to Sit     Supine to sit: Min assist;Mod assist;+2 for physical assistance;+2 for safety/equipment     General bed mobility comments: cues for sequence and use of R LE to self assist:  Physical assist to manage L LE  bed and to control trunk  Transfers Overall transfer level: Needs assistance Equipment used: Rolling walker (2 wheeled) Transfers: Sit to/from Stand Sit to Stand: Min assist;Mod assist;+2 physical assistance;+2 safety/equipment;From elevated surface         General transfer comment: cues for LE management and use of UEs to self assist  Ambulation/Gait Ambulation/Gait assistance: +2 physical assistance;+2 safety/equipment;Min assist;Mod assist Gait Distance (Feet): 17 Feet Assistive device: Rolling walker (2 wheeled) Gait Pattern/deviations: Step-to pattern;Decreased step length - right;Decreased step length - left;Shuffle;Trunk flexed Gait velocity: decr   General Gait Details: cues for sequence, posture, position from RW and to place wt on L LE:  Distance ltd by fatigue   Stairs             Wheelchair  Mobility    Modified Rankin (Stroke Patients Only)       Balance Overall balance assessment: Needs assistance Sitting-balance support: No upper extremity supported;Feet supported Sitting balance-Leahy Scale: Fair     Standing balance support: Bilateral upper extremity supported Standing balance-Leahy Scale: Poor                              Cognition Arousal/Alertness: Awake/alert Behavior During Therapy: WFL for tasks assessed/performed Overall Cognitive Status: Within Functional Limits for tasks assessed                                 General Comments: Pt appears more forgetful this pm      Exercises Total Joint Exercises Ankle Circles/Pumps: AROM;Both;15 reps;Supine Quad Sets: AROM;Both;10 reps;Supine Heel Slides: AAROM;Left;15 reps;Supine Hip ABduction/ADduction: AAROM;Left;15 reps;Supine    General Comments        Pertinent Vitals/Pain Pain Assessment: Faces Faces Pain Scale: Hurts little more Pain Location: L hip Pain Descriptors / Indicators: Aching;Sore Pain Intervention(s): Limited activity within patient's tolerance;Monitored during session;Premedicated before session    Home Living                      Prior Function            PT Goals (current goals can now be found in the care plan section) Acute Rehab PT Goals Patient Stated Goal: Regain IND  PT Goal Formulation: With patient Time For Goal  Achievement: 10/26/20 Potential to Achieve Goals: Good Progress towards PT goals: Progressing toward goals    Frequency    Min 5X/week      PT Plan Current plan remains appropriate    Co-evaluation              AM-PAC PT "6 Clicks" Mobility   Outcome Measure  Help needed turning from your back to your side while in a flat bed without using bedrails?: A Lot Help needed moving from lying on your back to sitting on the side of a flat bed without using bedrails?: A Lot Help needed moving to and from a bed  to a chair (including a wheelchair)?: A Lot Help needed standing up from a chair using your arms (e.g., wheelchair or bedside chair)?: A Lot Help needed to walk in hospital room?: A Lot Help needed climbing 3-5 steps with a railing? : A Lot 6 Click Score: 12    End of Session Equipment Utilized During Treatment: Gait belt Activity Tolerance: Patient limited by fatigue Patient left: in chair;with call bell/phone within reach;with chair alarm set;with nursing/sitter in room Nurse Communication: Mobility status PT Visit Diagnosis: Difficulty in walking, not elsewhere classified (R26.2)     Time: 0110-0349 PT Time Calculation (min) (ACUTE ONLY): 35 min  Charges:  $Gait Training: 8-22 mins $Therapeutic Exercise: 8-22 mins                     Debe Coder PT Acute Rehabilitation Services Pager [phone] Office [phone]    Alicia Boyd 10/13/2020, 1:03 PM

## 2020-10-13 NOTE — Care Management Important Message (Signed)
Important Message  Patient Details IM Letter given to the Patient Name: Alicia Boyd MRN: [id number] Date of Birth: Sep 19, 1930   Medicare Important Message Given:  Yes     Kerin Salen 10/13/2020, 1:11 PM

## 2020-10-13 NOTE — Progress Notes (Signed)
Subjective: 2 Days Post-Op Procedure(s) (LRB): ARTHROPLASTY BIPOLAR HIP (HEMIARTHROPLASTY) ANTERIOR APPROACH (Left) Patient reports pain as mild.    Objective: Vital signs in last 24 hours: Temp:  [97.7 F (36.5 C)-99.3 F (37.4 C)] 99 F (37.2 C) (10/15 1034) Pulse Rate:  [68-84] 84 (10/15 1034) Resp:  [16-18] 18 (10/15 1034) BP: (126-169)/(55-75) 126/75 (10/15 1034) SpO2:  [97 %-99 %] 97 % (10/15 1034)  Intake/Output from previous day: 10/14 0701 - 10/15 0700 In: 670 [P.O.:670] Out: 1250 [Urine:1250] Intake/Output this shift: Total I/O In: 120 [P.O.:120] Out: 100 [Urine:100]  Recent Labs    10/10/20 1806 10/11/20 0340 10/12/20 0656 10/13/20 0309  HGB 13.3 11.5* 10.8* 10.5*   Recent Labs    10/12/20 0656 10/13/20 0309  WBC 12.1* 11.1*  RBC 3.47* 3.46*  HCT 32.7* 32.5*  PLT 287 273   Recent Labs    10/12/20 0020 10/12/20 0656  NA 131* 133*  K 4.0 3.8  CL 98 98  CO2 27 27  BUN 16 12  CREATININE 0.44 0.44  GLUCOSE 197* 148*  CALCIUM 8.8* 8.9   Recent Labs    10/10/20 1806 10/11/20 1137  INR 1.0 1.0    Neurologically intact ABD soft Neurovascular intact Sensation intact distally Intact pulses distally Dorsiflexion/Plantar flexion intact Incision: dressing C/D/I No cellulitis present Compartment soft   Assessment/Plan: 2 Days Post-Op Procedure(s) (LRB): ARTHROPLASTY BIPOLAR HIP (HEMIARTHROPLASTY) ANTERIOR APPROACH (Left) Advance diet Up with therapy Discharge to SNF (patient lives at [facility] which has skilled nursing available.  She should be able to return to the facility).  Alicia Boyd has done very well since her surgery.  I Anticipate that she will continue to do so.  She is ready for discharge today.  We will see her postoperatively in clinic in 2 weeks.  She has my card and will call the clinic if she has any issues in the meantime.   Dereck Leep 10/13/2020, 11:38 AM

## 2020-10-13 NOTE — Progress Notes (Signed)
Report called to [place] [facility].  Bethann Punches RN

## 2020-10-13 NOTE — Discharge Summary (Signed)
Physician Discharge Summary  Alicia Boyd:[id number] DOB: [birth year] DOA: 10/10/2020  PCP: [name], MD  Admit date: 10/10/2020 Discharge date: 10/13/2020  Time spent: 50 minutes  Recommendations for Outpatient Follow-up:  1. Follow-up orthopedics in 2 weeks 2. Follow-up PCP in 2 weeks to discuss starting Prolia  Discharge Diagnoses:  Principal Problem:   S/P hip hemiarthroplasty Active Problems:   Essential hypertension   Esophageal reflux   Displaced fracture of left femoral neck (HCC)   Left displaced femoral neck fracture (HCC)   DNR (do not resuscitate) discussion   s/p left hip hemiarthroplasty   Discharge Condition: Stable  Diet recommendation: Regular diet  Filed Weights   10/10/20 1815  Weight: 55.3 kg    History of present illness:  86 year old female with history of osteoporosis, prior right femoral neck fracture, hypertension was sent from orthopedic surgery clinic for finding of left femoral neck fracture.  As per the patient 3 weeks ago she noted a sudden pop involving left hip while ambulating down the hallway with her friend at facility resulting in joint pain on ambulation and movement.  Initial x-ray was negative for acute fracture.  Patient was tried multiple and this without success.  Repeat x-ray showed finding of femoral neck fracture.  Hospital Course:   1. Left femoral neck fracture-s/p left hemiarthroplasty.  Orthopedic has cleared the patient for discharge. Pain well controlled.   2. Hypertension-blood pressure stable, continue metoprolol, Norvasc, Lasix per home regimen. 3. Hypokalemia-replete 4. GERD-continue PPI 5. Osteoporosis-outpatient BMD noted to have T score of -4.6 on 09/19/2020.  Patient had not tolerated Fosamax due to dysphagia in the past.  Plan was to start Prolia as outpatient. Follow-up PCP to discuss starting Prolia. 6. DVT prophylaxis-continue aspirin as per orthopedics recommendation.   Procedures:  Left  hemiarthroplasty  Consultations:  Orthopedics  Discharge Exam: Vitals:   10/13/20 1034 10/13/20 1346  BP: 126/75 (!) 138/57  Pulse: 84 74  Resp: 18 17  Temp: 99 F (37.2 C) 98.2 F (36.8 C)  SpO2: 97% 96%    General: Appears in no acute distress Cardiovascular: S1-S2, regular Respiratory: Clear to auscultation bilaterally  Discharge Instructions   Discharge Instructions    Call MD for:  difficulty breathing, headache or visual disturbances   Complete by: As directed    Call MD for:  persistant nausea and vomiting   Complete by: As directed    Call MD for:  severe uncontrolled pain   Complete by: As directed    Call MD for:  temperature >100.4   Complete by: As directed    Diet - low sodium heart healthy   Complete by: As directed    Discharge instructions   Complete by: As directed    1.  Keep waterproof bandage in place until we see you in clinic in 2 weeks. 2.  Do not apply lotions, creams or powders on the waterproof bandage. 3.  Continue to wear the knee-high compression stocking daily over the next 2 weeks. 4.  Apply ice over the bandage as necessary for pain or swelling. 5.  You may bear weight on your surgical leg beginning today.  I would like you to bend and straighten your hip as much as possible over the next few days. 6.  He will continue to participate with physical therapy at [facility]. 7.  Postoperative medication has been ordered for you to take.  Please follow the directions on the bottles.   Driving Restrictions   Complete  by: As directed    No driving until cleared by your health care provider.   Increase activity slowly   Complete by: As directed    Leave dressing on - Keep it clean, dry, and intact until clinic visit   Complete by: As directed      Allergies as of 10/13/2020      Reactions   Amitid [amitriptyline Hcl]    Patient experienced a dizzy, spinning feeling    Metronidazole    REACTION: throat swells   Penicillins     REACTION: rash & fever   Alendronate Sodium    REACTION: unspecified   Amoxicillin Nausea And Vomiting   Clarithromycin    GI intolerance   Flonase [fluticasone Propionate]    Slight Headache    Promethazine Hcl    REACTION: unspecified   Robitussin (alcohol Free) [guaifenesin] Other (See Comments)   unknown hyperactive   Spironolactone    09/21/13 weakness in legs   Singulair [montelukast Sodium]    "spacey"; ineffective      Medication List    TAKE these medications   acetaminophen 500 MG tablet Commonly known as: TYLENOL Take 1,000 mg by mouth every 6 (six) hours as needed for moderate pain.   acetaminophen 325 MG tablet Commonly known as: TYLENOL Take 650 mg by mouth in the morning and at bedtime. Not to exceed 3000 mg per 24 hours.   amLODipine 5 MG tablet Commonly known as: NORVASC TAKE 1 TABLET BY MOUTH EVERY DAY IN THE EVENING What changed:   how much to take  how to take this  when to take this   aspirin EC 81 MG tablet Take 2 tablets (162 mg total) by mouth 2 (two) times daily after a meal. What changed:   how much to take  when to take this   Biofreeze 4 % Gel Generic drug: Menthol (Topical Analgesic) Apply 1 application topically 2 (two) times daily as needed (left knee pain).   cyclobenzaprine 5 MG tablet Commonly known as: FLEXERIL Take 5 mg by mouth 3 (three) times daily as needed for muscle spasms.   docusate sodium 100 MG capsule Commonly known as: COLACE Take 1 capsule (100 mg total) by mouth 2 (two) times daily as needed (for constipation). What changed: reasons to take this   fexofenadine 180 MG tablet Commonly known as: ALLEGRA Take 180 mg by mouth daily as needed for allergies or rhinitis.   furosemide 40 MG tablet Commonly known as: LASIX Take 1 tablet (40 mg total) by mouth daily.   HYDROcodone-acetaminophen 5-325 MG tablet Commonly known as: NORCO/VICODIN Take 1 tablet by mouth every 6 (six) hours as needed for moderate  pain. Start date : 10/09/20   lactose free nutrition Liqd Take 237 mLs by mouth daily.   meloxicam 7.5 MG tablet Commonly known as: MOBIC Take 7.5 mg by mouth daily.   metoprolol succinate 25 MG 24 hr tablet Commonly known as: TOPROL-XL TAKE 1.5 TABLETS (37.5 MG TOTAL) BY MOUTH DAILY.   multivitamin tablet Take 1 tablet by mouth daily. chewable   omeprazole 20 MG capsule Commonly known as: PRILOSEC Take 20 mg by mouth daily.   oxyCODONE 5 MG immediate release tablet Commonly known as: Roxicodone Take 1 tablet (5 mg total) by mouth every 8 (eight) hours as needed for up to 7 days for severe pain (Take this medicine only for breakthrough pain not controlled by other pain medicines.).   traMADol 50 MG tablet Commonly known as: ULTRAM Take 1  tablet (50 mg total) by mouth every 6 (six) hours as needed. What changed: reasons to take this            Durable Medical Equipment  (From admission, onward)         Start     Ordered   10/13/20 1355  DME 3-in-1  (Discharge Planning)  Once        10/13/20 1354           Discharge Care Instructions  (From admission, onward)         Start     Ordered   10/13/20 0000  Leave dressing on - Keep it clean, dry, and intact until clinic visit        10/13/20 1328         Allergies  Allergen Reactions  . Amitid [Amitriptyline Hcl]     Patient experienced a dizzy, spinning feeling   . Metronidazole     REACTION: throat swells  . Penicillins     REACTION: rash & fever  . Alendronate Sodium     REACTION: unspecified  . Amoxicillin Nausea And Vomiting  . Clarithromycin     GI intolerance  . Flonase [Fluticasone Propionate]     Slight Headache   . Promethazine Hcl     REACTION: unspecified  . Robitussin (Alcohol Free) [Guaifenesin] Other (See Comments)    unknown hyperactive  . Spironolactone     09/21/13 weakness in legs  . Singulair [Montelukast Sodium]     "spacey"; ineffective    Contact information for  follow-up providers    [name], MD. Schedule an appointment as soon as possible for a visit in 2 week(s).   Specialty: Orthopedic Surgery Why: ASPARIN FOR dvt PROPHYLAXIS ONLY. bID TIMES 1 MONTH wT BEARING AS TOLERATED.  NO RESTRICTIONS TO BED MOBILITY cALL EARLIER FOR ANY PROBLEMS Contact information: [place] [address] [phone]        [name], MD On 10/24/2020.   Specialty: Orthopedic Surgery Why: Your clinic follow-up appointment is scheduled for October 24, 2020 at 2:15 PM. Contact information: [place] [address] [phone]            Contact information for after-discharge care    Destination    [facility] .   Service: Skilled Nursing Contact information: [place] [phone]                   The results of significant diagnostics from this hospitalization (including imaging, microbiology, ancillary and laboratory) are listed below for reference.    Significant Diagnostic Studies: Pelvis Portable  Result Date: 10/11/2020 CLINICAL DATA:  Left hip hemiarthroplasty EXAM: PORTABLE PELVIS 1-2 VIEWS COMPARISON:  09/03/2019 pelvic intraoperative radiographs FINDINGS: Interval left hip hemiarthroplasty. Prior right hip hemiarthroplasty. No evidence of hardware fracture. No acute osseous fracture. No evidence of hip dislocation on this single frontal view. Expected gas surrounding the left hip joint. IMPRESSION: Satisfactory immediate postoperative frontal view appearance status post left hip hemiarthroplasty. Electronically Signed   By: Ilona Sorrel M.D.   On: 10/11/2020 16:59   DG Bone Density  Result Date: 09/25/2020 EXAM: DUAL X-RAY ABSORPTIOMETRY (DXA) FOR BONE MINERAL DENSITY IMPRESSION: Referring Physician:  [name] Your patient completed a BMD test using Lunar IDXA DXA system ( analysis version: 16 ) manufactured by [employer].  Technologist: AW PATIENT: Name: Tiana, Sivertson Patient ID: [id number] Birth Date: Oct 09, 1930 Height: 64.0  in. Sex: Female Measured: 09/25/2020 Weight: 125.4 lbs. Indications: Advanced Age, Caucasian, Estrogen Deficient, Omeprazole, Osteoporosis (733), Postmenopausal Fractures: Right hip Treatments: None ASSESSMENT: The BMD measured at Femur Total is 0.428 g/cm2 with a T-score of -4.6. This patient is considered osteoporotic according to [place] [facility]) criteria. The scan quality is good. L-4 was excluded due to degenerative changes. Right femur was excluded due to surgical hardware. Site Region Measured Date Measured Age YA BMD Significant CHANGE T-score AP Spine   L1-L3  11/25/[age]    87.9         -4.5    0.625 g/cm2 Left Femur Neck   11/25/2020    87.9         -3.6    0.543 g/cm2 Left Femur Total  09/25/2020    87.9         -4.6    0.428 g/cm2 World Health Organization [facility]) criteria for post-menopausal, Caucasian Women: Normal       T-score at or above -1 SD Osteopenia   T-score between -1 and -2.5 SD Osteoporosis T-score at or below -2.5 SD RECOMMENDATION: 1. All patients should optimize calcium and vitamin D intake. 2. Consider FDA approved medical therapies in postmenopausal women and men aged 98 years and older, based on the following: a. A hip or vertebral (clinical or morphometric) fracture b. T- score < or = -2.5 at the femoral neck or spine after appropriate evaluation to exclude secondary causes c. Low bone mass (T-score between -1.0 and -2.5 at the femoral neck or spine) and a 10 year probability of a hip fracture > or = 3% or a 10 year probability of a major osteoporosis-related fracture > or = 20% based on the [place]-adapted WHO algorithm d. Clinician judgment and/or patient preferences may indicate treatment for people with 10-year fracture probabilities above or below these levels FOLLOW-UP: Patients with diagnosis of osteoporosis or at high risk for fracture should have regular bone  mineral density tests. For patients eligible for Medicare, routine testing is allowed once every 2 years. The testing frequency can be increased to one year for patients who have rapidly progressing disease, those who are receiving or discontinuing medical therapy to restore bone mass, or have additional risk factors. I have reviewed this report and agree with the above findings. [facility] 2 Radiology Electronically Signed   By: Lowella Grip III M.D.   On: 09/25/2020 11:28   Chest Portable 1 View  Result Date: 10/10/2020 CLINICAL DATA:  Preoperative radiograph EXAM: PORTABLE CHEST 1 VIEW COMPARISON:  Radiograph 11/03/2019 FINDINGS: Calcified granuloma in the right lung with additional calcified right hilar lymph node compatible sequela of prior granulomatous disease unchanged from comparison imaging. Some chronically coarsened interstitial and bronchitic features are also similar to comparison. No consolidation, features of edema, pneumothorax, or effusion. The aorta is calcified. The remaining cardiomediastinal contours are unremarkable. No acute osseous or soft tissue abnormality. Degenerative changes are present in the imaged spine and shoulders. IMPRESSION: 1. No acute cardiopulmonary abnormality. 2. Chronically coarsened interstitial and bronchitic features, similar to comparison imaging. 3. Right lung granuloma and calcified right hilar lymph node compatible with sequela of prior granulomatous disease. 4.  Aortic Atherosclerosis (ICD10-I70.0). Electronically Signed   By: Lovena Le M.D.   On: 10/10/2020 19:02   DG C-Arm 1-60 Min-No Report  Result Date: 10/11/2020 CLINICAL DATA:  Left hip arthroplasty EXAM: OPERATIVE LEFT HIP WITH PELVIS; DG C-ARM 1-60 MIN-NO REPORT COMPARISON:  Fine 420 intraoperative pelvic radiographs FLUOROSCOPY TIME:  Fluoroscopy  Time:  0 minutes 10 seconds Number of Acquired Spot Images: 2 FINDINGS: Nondiagnostic spot fluoroscopic intraoperative of left hip radiographs  demonstrate interval left hip hemiarthroplasty with no appreciable hip dislocation on these views. IMPRESSION: Intraoperative fluoroscopic guidance for left hip hemiarthroplasty. Electronically Signed   By: Ilona Sorrel M.D.   On: 10/11/2020 16:58   DG HIP OPERATIVE UNILAT W OR W/O PELVIS LEFT  Result Date: 10/11/2020 CLINICAL DATA:  Left hip arthroplasty EXAM: OPERATIVE LEFT HIP WITH PELVIS; DG C-ARM 1-60 MIN-NO REPORT COMPARISON:  Fine 420 intraoperative pelvic radiographs FLUOROSCOPY TIME:  Fluoroscopy Time:  0 minutes 10 seconds Number of Acquired Spot Images: 2 FINDINGS: Nondiagnostic spot fluoroscopic intraoperative of left hip radiographs demonstrate interval left hip hemiarthroplasty with no appreciable hip dislocation on these views. IMPRESSION: Intraoperative fluoroscopic guidance for left hip hemiarthroplasty. Electronically Signed   By: Ilona Sorrel M.D.   On: 10/11/2020 16:58    Microbiology: Recent Results (from the past 240 hour(s))  Respiratory Panel by RT PCR (Flu A&B, Covid) - Nasopharyngeal Swab     Status: None   Collection Time: 10/10/20  7:10 PM   Specimen: Nasopharyngeal Swab  Result Value Ref Range Status   SARS Coronavirus 2 by RT PCR NEGATIVE NEGATIVE Final    Comment: (NOTE) SARS-CoV-2 target nucleic acids are NOT DETECTED.  The SARS-CoV-2 RNA is generally detectable in upper respiratoy specimens during the acute phase of infection. The lowest concentration of SARS-CoV-2 viral copies this assay can detect is 131 copies/mL. A negative result does not preclude SARS-Cov-2 infection and should not be used as the sole basis for treatment or other patient management decisions. A negative result may occur with  improper specimen collection/handling, submission of specimen other than nasopharyngeal swab, presence of viral mutation(s) within the areas targeted by this assay, and inadequate number of viral copies (<131 copies/mL). A negative result must be combined with  clinical observations, patient history, and epidemiological information. The expected result is Negative.  Fact Sheet for Patients:  PinkCheek.be  Fact Sheet for Healthcare Providers:  GravelBags.it  This test is no t yet approved or cleared by the [place] FDA and  has been authorized for detection and/or diagnosis of SARS-CoV-2 by FDA under an Emergency Use Authorization (EUA). This EUA will remain  in effect (meaning this test can be used) for the duration of the COVID-19 declaration under Section 564(b)(1) of the Act, 21 U.S.C. section 360bbb-3(b)(1), unless the authorization is terminated or revoked sooner.     Influenza A by PCR NEGATIVE NEGATIVE Final   Influenza B by PCR NEGATIVE NEGATIVE Final    Comment: (NOTE) The Xpert Xpress SARS-CoV-2/FLU/RSV assay is intended as an aid in  the diagnosis of influenza from Nasopharyngeal swab specimens and  should not be used as a sole basis for treatment. Nasal washings and  aspirates are unacceptable for Xpert Xpress SARS-CoV-2/FLU/RSV  testing.  Fact Sheet for Patients: PinkCheek.be  Fact Sheet for Healthcare Providers: GravelBags.it  This test is not yet approved or cleared by the [place] FDA and  has been authorized for detection and/or diagnosis of SARS-CoV-2 by  FDA under an Emergency Use Authorization (EUA). This EUA will remain  in effect (meaning this test can be used) for the duration of the  Covid-19 declaration under Section 564(b)(1) of the Act, 21  U.S.C. section 360bbb-3(b)(1), unless the authorization is  terminated or revoked. Performed at [facility], [place] [address], [place], [place] [address]   MRSA PCR Screening  Status: None   Collection Time: 10/11/20  1:54 AM   Specimen: Nasal Mucosa; Nasopharyngeal  Result Value Ref Range Status   MRSA by PCR NEGATIVE  NEGATIVE Final    Comment:        The GeneXpert MRSA Assay (FDA approved for NASAL specimens only), is one component of a comprehensive MRSA colonization surveillance program. It is not intended to diagnose MRSA infection nor to guide or monitor treatment for MRSA infections. Performed at [facility], [place] [address] [place], [place] [address]      Labs: Basic Metabolic Panel: Recent Labs  Lab 10/10/20 1806 10/11/20 0340 10/12/20 0020 10/12/20 0656  NA 136 135 131* 133*  K 3.4* 3.3* 4.0 3.8  CL 98 99 98 98  CO2 28 27 27 27   GLUCOSE 103* 103* 197* 148*  BUN 16 19 16 12   CREATININE 0.46 0.47 0.44 0.44  CALCIUM 9.6 9.0 8.8* 8.9   Liver Function Tests: Recent Labs  Lab 10/10/20 1806  AST 14*  ALT 16  ALKPHOS 114  BILITOT 0.6  PROT 6.5  ALBUMIN 4.0   No results for input(s): LIPASE, AMYLASE in the last 168 hours. No results for input(s): AMMONIA in the last 168 hours. CBC: Recent Labs  Lab 10/10/20 1806 10/11/20 0340 10/12/20 0656 10/13/20 0309  WBC 8.9 7.4 12.1* 11.1*  NEUTROABS 7.4  --   --   --   HGB 13.3 11.5* 10.8* 10.5*  HCT 40.9 34.9* 32.7* 32.5*  MCV 94.9 92.6 94.2 93.9  PLT 353 284 287 273       Signed:  Oswald Hillock MD.  [facility] 10/13/2020, 1:58 PM

## 2020-10-13 NOTE — Discharge Instructions (Signed)
1.  Keep waterproof bandage in place until we see you in clinic in 2 weeks. 2.  Do not apply lotions, creams or powders on the waterproof bandage. 3.  Continue to wear the knee-high compression stocking daily over the next 2 weeks. 4.  Apply ice over the bandage as necessary for pain or swelling. 5.  You may bear weight on your surgical leg beginning today.  I would like you to bend and straighten your hip as much as possible over the next few days. 6.  He will continue to participate with physical therapy at [facility]. 7.  Postoperative medication has been ordered for you to take.  Please follow the directions on the bottles.

## 2020-10-13 NOTE — TOC Transition Note (Signed)
Transition of Care [facility]) - CM/SW Discharge Note   Patient Details  Name: Alicia Boyd MRN: [id number] Date of Birth: Aug 15, 1930  [facility]) CM/SW Contact:  Lennart Pall, LCSW Phone Number: 10/13/2020, 2:04 PM   Clinical Narrative:    Pt medically cleared for dc today.  PTAR to transport pt to SNF level at [facility] [place].  Pt in agreement. No further TOC needs.   Final next level of care: Skilled Nursing Facility Barriers to Discharge: Barriers Resolved   Patient Goals and CMS Choice Patient states their goals for this hospitalization and ongoing recovery are:: to initially return to [facility] [place] via the SNF level of care      Discharge Placement   Existing PASRR number confirmed : 10/11/20          Patient chooses bed at: [name] Patient to be transferred to facility by: [facility] Name of family member notified: friend, [name] Patient and family notified of of transfer: 10/13/20  Discharge Plan and Services In-house Referral: Clinical Social Work              DME Arranged: N/A DME Agency: NA       HH Arranged: NA HH Agency: NA        Social Determinants of Health (SDOH) Interventions     Readmission Risk Interventions Readmission Risk Prevention Plan 10/11/2020  Post Dischage Appt Complete  Medication Screening Complete  Transportation Screening Complete  Some recent data might be hidden

## 2020-10-13 NOTE — Plan of Care (Signed)
  Problem: Education: Goal: Knowledge of General Education information will improve Description: Including pain rating scale, medication(s)/side effects and non-pharmacologic comfort measures Outcome: Adequate for Discharge   Problem: Health Behavior/Discharge Planning: Goal: Ability to manage health-related needs will improve Outcome: Adequate for Discharge   Problem: Clinical Measurements: Goal: Ability to maintain clinical measurements within normal limits will improve Outcome: Adequate for Discharge Goal: Will remain free from infection Outcome: Adequate for Discharge Goal: Diagnostic test results will improve Outcome: Adequate for Discharge Goal: Respiratory complications will improve Outcome: Adequate for Discharge Goal: Cardiovascular complication will be avoided Outcome: Adequate for Discharge   Problem: Activity: Goal: Risk for activity intolerance will decrease Outcome: Adequate for Discharge   Problem: Coping: Goal: Level of anxiety will decrease Outcome: Adequate for Discharge   Problem: Elimination: Goal: Will not experience complications related to bowel motility Outcome: Adequate for Discharge   Problem: Pain Managment: Goal: General experience of comfort will improve Outcome: Adequate for Discharge   Problem: Safety: Goal: Ability to remain free from injury will improve Outcome: Adequate for Discharge   Problem: Skin Integrity: Goal: Risk for impaired skin integrity will decrease Outcome: Adequate for Discharge   Problem: Education: Goal: Verbalization of understanding the information provided (i.e., activity precautions, restrictions, etc) will improve Outcome: Adequate for Discharge   Problem: Activity: Goal: Ability to ambulate and perform ADLs will improve Outcome: Adequate for Discharge   Problem: Clinical Measurements: Goal: Postoperative complications will be avoided or minimized Outcome: Adequate for Discharge   Problem:  Self-Concept: Goal: Ability to maintain and perform role responsibilities to the fullest extent possible will improve Outcome: Adequate for Discharge   Problem: Pain Management: Goal: Pain level will decrease Outcome: Adequate for Discharge   Problem: Acute Rehab PT Goals(only PT should resolve) Goal: Pt Will Go Supine/Side To Sit Outcome: Adequate for Discharge Goal: Pt Will Go Sit To Supine/Side Outcome: Adequate for Discharge Goal: Patient Will Transfer Sit To/From Stand Outcome: Adequate for Discharge Goal: Pt Will Ambulate Outcome: Adequate for Discharge

## 2020-10-16 ENCOUNTER — Encounter: Payer: Self-pay | Admitting: Internal Medicine

## 2020-10-16 ENCOUNTER — Non-Acute Institutional Stay (SKILLED_NURSING_FACILITY): Payer: MEDICARE | Admitting: Internal Medicine

## 2020-10-16 DIAGNOSIS — F32A Depression, unspecified: Secondary | ICD-10-CM

## 2020-10-16 DIAGNOSIS — F419 Anxiety disorder, unspecified: Secondary | ICD-10-CM | POA: Diagnosis not present

## 2020-10-16 DIAGNOSIS — Z96649 Presence of unspecified artificial hip joint: Secondary | ICD-10-CM | POA: Diagnosis not present

## 2020-10-16 DIAGNOSIS — I10 Essential (primary) hypertension: Secondary | ICD-10-CM

## 2020-10-16 DIAGNOSIS — K219 Gastro-esophageal reflux disease without esophagitis: Secondary | ICD-10-CM

## 2020-10-16 DIAGNOSIS — M810 Age-related osteoporosis without current pathological fracture: Secondary | ICD-10-CM

## 2020-10-16 NOTE — Progress Notes (Signed)
Provider:  Veleta Miners MD Location:  [facility] Room Number: 30 Place of Service:  SNF ([phone])  PCP: [name], MD Patient Care Team: [name], MD as PCP - General (Internal Medicine) [name], MD as Consulting Physician (Orthopedic Surgery)  Extended Emergency Contact Information Primary Emergency Contact: [name] Mobile Phone: [phone] Relation: Friend Secondary Emergency Contact: cook,carolyn Mobile Phone: [phone] Relation: Friend Preferred language: English  Code Status: Full Code Goals of Care: Advanced Directive information Advanced Directives 10/11/2020  Does Patient Have a Medical Advance Directive? Yes  Type of Paramedic of [place];Living will  Does patient want to make changes to medical advance directive? No - Patient declined  Copy of [place] in Chart? -  Would patient like information on creating a medical advance directive? -  Pre-existing out of facility DNR order (yellow form or pink MOST form) -      Chief Complaint  Patient presents with  . New Admit To SNF    Admission    HPI: Patient is a 86 y.o. female seen today for admission to SNF for Therapy  Has history of diastolic CHF with echo showing LVH and severe left atrial enlargement possible amyloidosis,hypertension, history of palpitations,CAD,history of right hip fracture in 9/20,osteoporosis and anemia,hypertensive retinopathy Recent Calcaneal Non displaced Fracture of Right Foot  Was send to Ortho for Continuous Left Leg and Hip Pain Was found to have Left Hip Fracture Was admitted and underwent left bipolar hemiarthroplasty on 10/14 -Postop course was uneventful Had pain seems to be controlled on Norco and oxycodone She is back in SNF for therapy Her only complaint today was pain and inability to do therapy because of that.  She also feels anxious as she does not have any  family. Otherwise doing well.  Patient is a recent AL admit  Past Medical History:  Diagnosis Date  . Aortic heart murmur    AS/AI on 2D ECHO ; SBE prophylaxis Rxed  . Chest pain 2007 ; 2011   negative cardiac assessment  . GERD (gastroesophageal reflux disease)   . Hyperlipidemia   . Hypertension [  . Interstitial cystitis    Resolved  . Ocular migraine 04/10/2012  . Osteoporosis    T score - 3.0 @ femoral neck; Fosamax affected swallowing  . Peripheral vascular disease ([place])   . Postmenopausal    No PMH or HRT   Past Surgical History:  Procedure Laterality Date  . ANTERIOR APPROACH HEMI HIP ARTHROPLASTY Right 09/03/2019   Procedure: ANTERIOR APPROACH HEMI HIP ARTHROPLASTY BIPOLAR;  Surgeon: [name], MD;  Location: [facility];  Service: Orthopedics;  Laterality: Right;  . COLONOSCOPY  2011   Dr Sharlett Iles  . HIP ARTHROPLASTY Left 10/11/2020   Procedure: ARTHROPLASTY BIPOLAR HIP (HEMIARTHROPLASTY) ANTERIOR APPROACH;  Surgeon: [name], MD;  Location: [facility];  Service: Orthopedics;  Laterality: Left;    reports that she has never smoked. She has never used smokeless tobacco. She reports that she does not drink alcohol and does not use drugs. Social History   Socioeconomic History  . Marital status: Widowed    Spouse name: Not on file  . Number of children: 1  . Years of education: Not on file  . Highest education level: Not on file  Occupational History  . Not on file  Tobacco Use  . Smoking status: Never Smoker  . Smokeless tobacco: Never Used  Substance and Sexual Activity  . Alcohol use: No  .  Drug use: No  . Sexual activity: Not Currently    Birth control/protection: None  Other Topics Concern  . Not on file  Social History Narrative  . Not on file   Social Determinants of Health   Financial Resource Strain:   . Difficulty of Paying Living Expenses: Not on file  Food Insecurity:   . Worried About Charity fundraiser in the Last Year: Not on file  .  Ran Out of Food in the Last Year: Not on file  Transportation Needs:   . Lack of Transportation (Medical): Not on file  . Lack of Transportation (Non-Medical): Not on file  Physical Activity:   . Days of Exercise per Week: Not on file  . Minutes of Exercise per Session: Not on file  Stress:   . Feeling of Stress : Not on file  Social Connections:   . Frequency of Communication with Friends and Family: Not on file  . Frequency of Social Gatherings with Friends and Family: Not on file  . Attends Religious Services: Not on file  . Active Member of Clubs or Organizations: Not on file  . Attends Archivist Meetings: Not on file  . Marital Status: Not on file  Intimate Partner Violence:   . Fear of Current or Ex-Partner: Not on file  . Emotionally Abused: Not on file  . Physically Abused: Not on file  . Sexually Abused: Not on file    Functional Status Survey:    Family History  Problem Relation Age of Onset  . Diabetes Father        Pancreatitic insufficiency post MVA  . Hypertension Mother   . Osteoporosis Mother   . Heart disease Mother        AF  . Thyroid disease Sister        hypothyroidism  . Coronary artery disease Sister        AF  . Stroke Sister 31       also pulmonary disease    Health Maintenance  Topic Date Due  . INFLUENZA VACCINE  07/30/2020  . TETANUS/TDAP  07/11/2025  . DEXA SCAN  Completed  . COVID-19 Vaccine  Completed  . PNA vac Low Risk Adult  Completed    Allergies  Allergen Reactions  . Amitid [Amitriptyline Hcl]     Patient experienced a dizzy, spinning feeling   . Metronidazole     REACTION: throat swells  . Penicillins     REACTION: rash & fever  . Alendronate Sodium     REACTION: unspecified  . Amoxicillin Nausea And Vomiting  . Clarithromycin     GI intolerance  . Flonase [Fluticasone Propionate]     Slight Headache   . Promethazine Hcl     REACTION: unspecified  . Robitussin (Alcohol Free) [Guaifenesin] Other (See  Comments)    unknown hyperactive  . Spironolactone     09/21/13 weakness in legs  . Singulair [Montelukast Sodium]     "spacey"; ineffective    Outpatient Encounter Medications as of 10/16/2020  Medication Sig  . acetaminophen (TYLENOL) 325 MG tablet Take 650 mg by mouth in the morning and at bedtime. Not to exceed 3000 mg per 24 hours.  Marland Kitchen acetaminophen (TYLENOL) 500 MG tablet Take 1,000 mg by mouth every 6 (six) hours as needed for moderate pain.  Marland Kitchen amLODipine (NORVASC) 5 MG tablet TAKE 1 TABLET BY MOUTH EVERY DAY IN THE EVENING (Patient taking differently: Take 5 mg by mouth daily. )  .  aspirin EC 81 MG tablet Take 2 tablets (162 mg total) by mouth 2 (two) times daily after a meal.  . docusate sodium (COLACE) 100 MG capsule Take 1 capsule (100 mg total) by mouth 2 (two) times daily as needed (for constipation).  . fexofenadine (ALLEGRA) 180 MG tablet Take 180 mg by mouth daily as needed for allergies or rhinitis.   . furosemide (LASIX) 40 MG tablet Take 1 tablet (40 mg total) by mouth daily.  Marland Kitchen HYDROcodone-acetaminophen (NORCO/VICODIN) 5-325 MG tablet Take 1 tablet by mouth every 6 (six) hours as needed for moderate pain. Start date : 10/09/20  . lactose free nutrition (BOOST PLUS) LIQD Take 237 mLs by mouth daily.  . Menthol, Topical Analgesic, (BIOFREEZE) 4 % GEL Apply 1 application topically 2 (two) times daily as needed (left knee pain).   . Methocarbamol (ROBAXIN PO) Take 250 mg by mouth every 8 (eight) hours as needed.  . metoprolol succinate (TOPROL-XL) 25 MG 24 hr tablet TAKE 1.5 TABLETS (37.5 MG TOTAL) BY MOUTH DAILY.  . Multiple Vitamin (MULTIVITAMIN) tablet Take 1 tablet by mouth daily. chewable  . omeprazole (PRILOSEC) 20 MG capsule Take 20 mg by mouth daily.  Marland Kitchen oxyCODONE (ROXICODONE) 5 MG immediate release tablet Take 1 tablet (5 mg total) by mouth every 8 (eight) hours as needed for up to 7 days for severe pain (Take this medicine only for breakthrough pain not controlled by  other pain medicines.).  . [DISCONTINUED] cyclobenzaprine (FLEXERIL) 5 MG tablet Take 5 mg by mouth 3 (three) times daily as needed for muscle spasms.  . [DISCONTINUED] meloxicam (MOBIC) 7.5 MG tablet Take 7.5 mg by mouth daily.  . [DISCONTINUED] traMADol (ULTRAM) 50 MG tablet Take 1 tablet (50 mg total) by mouth every 6 (six) hours as needed. (Patient taking differently: Take 50 mg by mouth every 6 (six) hours as needed for moderate pain. )   No facility-administered encounter medications on file as of 10/16/2020.    Review of Systems  Constitutional: Positive for activity change.  HENT: Negative.   Respiratory: Negative.   Cardiovascular: Negative.   Gastrointestinal: Negative.   Genitourinary: Negative.   Musculoskeletal: Positive for arthralgias, gait problem, joint swelling and myalgias.  Skin: Negative.   Neurological: Positive for weakness.  Psychiatric/Behavioral: Positive for dysphoric mood. The patient is nervous/anxious.   All other systems reviewed and are negative.   Vitals:   10/16/20 1459  BP: (!) 158/62  Pulse: 88  Resp: 17  Temp: 97.9 F (36.6 C)  SpO2: 94%  Weight: 122 lb (55.3 kg)  Height: 5\' 4"  (1.626 m)   Body mass index is 20.94 kg/m. Physical Exam Vitals reviewed.  Constitutional:      Appearance: Normal appearance.  HENT:     Head: Normocephalic.     Nose: Nose normal.     Mouth/Throat:     Mouth: Mucous membranes are moist.     Pharynx: Oropharynx is clear.  Eyes:     Pupils: Pupils are equal, round, and reactive to light.  Cardiovascular:     Rate and Rhythm: Normal rate and regular rhythm.     Pulses: Normal pulses.  Pulmonary:     Effort: Pulmonary effort is normal.     Breath sounds: Normal breath sounds.  Abdominal:     General: Abdomen is flat. Bowel sounds are normal.     Palpations: Abdomen is soft.  Musculoskeletal:        General: No swelling.     Cervical back: Neck  supple.  Skin:    General: Skin is warm and dry.   Neurological:     General: No focal deficit present.     Mental Status: She is oriented to person, place, and time.  Psychiatric:        Mood and Affect: Mood normal.        Thought Content: Thought content normal.     Labs reviewed: Basic Metabolic Panel: Recent Labs    10/11/20 0340 10/12/20 0020 10/12/20 0656  NA 135 131* 133*  K 3.3* 4.0 3.8  CL 99 98 98  CO2 27 27 27   GLUCOSE 103* 197* 148*  BUN 19 16 12   CREATININE 0.47 0.44 0.44  CALCIUM 9.0 8.8* 8.9   Liver Function Tests: Recent Labs    05/11/20 0000 09/01/20 0000 10/10/20 1806  AST 10* 11* 14*  ALT 10 11 16   ALKPHOS 72 135* 114  BILITOT  --   --  0.6  PROT  --   --  6.5  ALBUMIN 3.7 3.9 4.0   No results for input(s): LIPASE, AMYLASE in the last 8760 hours. No results for input(s): AMMONIA in the last 8760 hours. CBC: Recent Labs    11/03/19 1411 05/11/20 0000 05/11/20 0000 09/01/20 0000 10/10/20 1806 10/10/20 1806 10/11/20 0340 10/12/20 0656 10/13/20 0309  WBC   < > 5.7   < > 6.8 8.9   < > 7.4 12.1* 11.1*  NEUTROABS  --  3,523  --  4,481 7.4  --   --   --   --   HGB   < > 12.2   < > 12.3 13.3   < > 11.5* 10.8* 10.5*  HCT   < > 36   < > 37 40.9   < > 34.9* 32.7* 32.5*  MCV   < >  --   --   --  94.9   < > 92.6 94.2 93.9  PLT   < > 239   < > 322 353   < > 284 287 273   < > = values in this interval not displayed.   Cardiac Enzymes: No results for input(s): CKTOTAL, CKMB, CKMBINDEX, TROPONINI in the last 8760 hours. BNP: Invalid input(s): POCBNP Lab Results  Component Value Date   HGBA1C 5.5 07/12/2015   Lab Results  Component Value Date   TSH 1.90 05/11/2020   Lab Results  Component Value Date   VITAMINB12 707 07/27/2010   Lab Results  Component Value Date   FOLATE >20.0 ng/mL 07/27/2010   Lab Results  Component Value Date   IRON 81 07/27/2010   FERRITIN 50.5 04/17/2010    Imaging and Procedures obtained prior to SNF admission: Pelvis Portable  Result Date:  10/11/2020 CLINICAL DATA:  Left hip hemiarthroplasty EXAM: PORTABLE PELVIS 1-2 VIEWS COMPARISON:  09/03/2019 pelvic intraoperative radiographs FINDINGS: Interval left hip hemiarthroplasty. Prior right hip hemiarthroplasty. No evidence of hardware fracture. No acute osseous fracture. No evidence of hip dislocation on this single frontal view. Expected gas surrounding the left hip joint. IMPRESSION: Satisfactory immediate postoperative frontal view appearance status post left hip hemiarthroplasty. Electronically Signed   By: Ilona Sorrel M.D.   On: 10/11/2020 16:59   Chest Portable 1 View  Result Date: 10/10/2020 CLINICAL DATA:  Preoperative radiograph EXAM: PORTABLE CHEST 1 VIEW COMPARISON:  Radiograph 11/03/2019 FINDINGS: Calcified granuloma in the right lung with additional calcified right hilar lymph node compatible sequela of prior granulomatous disease unchanged from comparison imaging. Some chronically coarsened interstitial  and bronchitic features are also similar to comparison. No consolidation, features of edema, pneumothorax, or effusion. The aorta is calcified. The remaining cardiomediastinal contours are unremarkable. No acute osseous or soft tissue abnormality. Degenerative changes are present in the imaged spine and shoulders. IMPRESSION: 1. No acute cardiopulmonary abnormality. 2. Chronically coarsened interstitial and bronchitic features, similar to comparison imaging. 3. Right lung granuloma and calcified right hilar lymph node compatible with sequela of prior granulomatous disease. 4.  Aortic Atherosclerosis (ICD10-I70.0). Electronically Signed   By: Lovena Le M.D.   On: 10/10/2020 19:02   DG C-Arm 1-60 Min-No Report  Result Date: 10/11/2020 CLINICAL DATA:  Left hip arthroplasty EXAM: OPERATIVE LEFT HIP WITH PELVIS; DG C-ARM 1-60 MIN-NO REPORT COMPARISON:  Fine 420 intraoperative pelvic radiographs FLUOROSCOPY TIME:  Fluoroscopy Time:  0 minutes 10 seconds Number of Acquired Spot  Images: 2 FINDINGS: Nondiagnostic spot fluoroscopic intraoperative of left hip radiographs demonstrate interval left hip hemiarthroplasty with no appreciable hip dislocation on these views. IMPRESSION: Intraoperative fluoroscopic guidance for left hip hemiarthroplasty. Electronically Signed   By: Ilona Sorrel M.D.   On: 10/11/2020 16:58   DG HIP OPERATIVE UNILAT W OR W/O PELVIS LEFT  Result Date: 10/11/2020 CLINICAL DATA:  Left hip arthroplasty EXAM: OPERATIVE LEFT HIP WITH PELVIS; DG C-ARM 1-60 MIN-NO REPORT COMPARISON:  Fine 420 intraoperative pelvic radiographs FLUOROSCOPY TIME:  Fluoroscopy Time:  0 minutes 10 seconds Number of Acquired Spot Images: 2 FINDINGS: Nondiagnostic spot fluoroscopic intraoperative of left hip radiographs demonstrate interval left hip hemiarthroplasty with no appreciable hip dislocation on these views. IMPRESSION: Intraoperative fluoroscopic guidance for left hip hemiarthroplasty. Electronically Signed   By: Ilona Sorrel M.D.   On: 10/11/2020 16:58    Assessment/Plan S/P hip hemiarthroplasty Discontinue Flexeril Will start on Robaxin 250 mg TID PRN Tylenol  Will also start on Oxycodone 5 mg TID  Scheduled Discontinue Tramadol and Mobic On Aspirin for Prophylaxis Working with therapy. Follow up with Ortho Diastolic CHF On Lasix Essential hypertension Controlled on Norvasc and Toprol Gastroesophageal reflux disease without esophagitis On Prilosec Age-related osteoporosis without current pathological fracture Will need Prolia  Anxiety and depression Does not want anything yet    Family/ staff Communication:   Labs/tests ordered: CBC,CMP in 1 week

## 2020-10-24 DIAGNOSIS — S72042A Displaced fracture of base of neck of left femur, initial encounter for closed fracture: Secondary | ICD-10-CM | POA: Diagnosis not present

## 2020-10-25 LAB — HEPATIC FUNCTION PANEL
ALT: 10 (ref 7–35)
AST: 10 — AB (ref 13–35)
Alkaline Phosphatase: 108 (ref 25–125)
Bilirubin, Total: 0.3

## 2020-10-25 LAB — BASIC METABOLIC PANEL
BUN: 7 (ref 4–21)
CO2: 32 — AB (ref 13–22)
Chloride: 101 (ref 99–108)
Creatinine: 0.5 (ref 0.5–1.1)
Glucose: 83
Potassium: 3.8 (ref 3.4–5.3)
Sodium: 140 (ref 137–147)

## 2020-10-25 LAB — COMPREHENSIVE METABOLIC PANEL
Albumin: 2.7 — AB (ref 3.5–5.0)
Calcium: 8.9 (ref 8.7–10.7)
Globulin: 1.7

## 2020-10-25 LAB — CBC AND DIFFERENTIAL
HCT: 27 — AB (ref 36–46)
Hemoglobin: 8.9 — AB (ref 12.0–16.0)
Platelets: 375 (ref 150–399)
WBC: 5.8

## 2020-10-25 LAB — CBC: RBC: 2.91 — AB (ref 3.87–5.11)

## 2020-10-26 ENCOUNTER — Non-Acute Institutional Stay (SKILLED_NURSING_FACILITY): Payer: MEDICARE | Admitting: Nurse Practitioner

## 2020-10-26 ENCOUNTER — Encounter: Payer: Self-pay | Admitting: Nurse Practitioner

## 2020-10-26 DIAGNOSIS — D649 Anemia, unspecified: Secondary | ICD-10-CM

## 2020-10-26 DIAGNOSIS — K5901 Slow transit constipation: Secondary | ICD-10-CM

## 2020-10-26 DIAGNOSIS — K219 Gastro-esophageal reflux disease without esophagitis: Secondary | ICD-10-CM

## 2020-10-26 DIAGNOSIS — I10 Essential (primary) hypertension: Secondary | ICD-10-CM | POA: Diagnosis not present

## 2020-10-26 DIAGNOSIS — S72002A Fracture of unspecified part of neck of left femur, initial encounter for closed fracture: Secondary | ICD-10-CM

## 2020-10-26 DIAGNOSIS — I872 Venous insufficiency (chronic) (peripheral): Secondary | ICD-10-CM | POA: Diagnosis not present

## 2020-10-26 NOTE — Assessment & Plan Note (Signed)
10/24/20 wbc 5.8, Hgb 8.9, plt 375, Na 140, K 3.8, Bun 7, creat 0.49, eGFR 88, Fe 381m 3x/wk, CBC one week.

## 2020-10-26 NOTE — Progress Notes (Signed)
Location:    [place] Room Number: 30 Place of Service:  SNF (31) Provider: Lennie Odor Maite Burlison NP  [name], MD  Patient Care Team: [name], MD as PCP - General (Internal Medicine) [name], MD as Consulting Physician (Orthopedic Surgery)  Extended Emergency Contact Information Primary Emergency Contact: [name] Mobile Phone: [phone] Relation: Friend Secondary Emergency Contact: cook,carolyn Mobile Phone: [phone] Relation: Friend Preferred language: English  Code Status: DNR Goals of care: Advanced Directive information Advanced Directives 10/11/2020  Does Patient Have a Medical Advance Directive? Yes  Type of Paramedic of [place];Living will  Does patient want to make changes to medical advance directive? No - Patient declined  Copy of [place] in Chart? -  Would patient like information on creating a medical advance directive? -  Pre-existing out of facility DNR order (yellow form or pink MOST form) -     Chief Complaint  Patient presents with  . Acute Visit    Anemia    HPI:  Pt is a 86 y.o. female seen today for an acute visit for 10/24/20 wbc 5.8, Hgb 8.9, plt 375, Na 140, K 3.8, Bun 7, creat 0.49, eGFR 88. The patient denied blood in urine or stool, denied upset stomach, abd pain, nausea, or vomiting  S/p left bipolar hemiarthroplasty 10/12/20 for left hip fracture. Takes Tylenol, Norco, Methocarbamol for pain.   HTN, blood pressure is controlled, on amlodipine 73m qd, Metoprolol 37.522mqd.  CHF/PVD, peripheral edema, on Furosemide 4046md.  GERD, stable, on Omeprazole.     Past Medical History:  Diagnosis Date  . Aortic heart murmur    AS/AI on 2D ECHO ; SBE prophylaxis Rxed  . Chest pain 2007 ; 2011   negative cardiac assessment  . GERD (gastroesophageal reflux disease)   . Hyperlipidemia   . Hypertension [  . Interstitial  cystitis    Resolved  . Ocular migraine 04/10/2012  . Osteoporosis    T score - 3.0 @ femoral neck; Fosamax affected swallowing  . Peripheral vascular disease ([facility] . Postmenopausal    No PMH or HRT   Past Surgical History:  Procedure Laterality Date  . ANTERIOR APPROACH HEMI HIP ARTHROPLASTY Right 09/03/2019   Procedure: ANTERIOR APPROACH HEMI HIP ARTHROPLASTY BIPOLAR;  Surgeon: GraDorna LeitzD;  Location: [facility];  Service: Orthopedics;  Laterality: Right;  . COLONOSCOPY  2011   Dr PatSharlett Iles HIP ARTHROPLASTY Left 10/11/2020   Procedure: ARTHROPLASTY BIPOLAR HIP (HEMIARTHROPLASTY) ANTERIOR APPROACH;  Surgeon: GraDorna LeitzD;  Location: [facility];  Service: Orthopedics;  Laterality: Left;    Allergies  Allergen Reactions  . Amitid [Amitriptyline Hcl]     Patient experienced a dizzy, spinning feeling   . Metronidazole     REACTION: throat swells  . Penicillins     REACTION: rash & fever  . Alendronate Sodium     REACTION: unspecified  . Amoxicillin Nausea And Vomiting  . Clarithromycin     GI intolerance  . Flonase [Fluticasone Propionate]     Slight Headache   . Promethazine Hcl     REACTION: unspecified  . Robitussin (Alcohol Free) [Guaifenesin] Other (See Comments)    unknown hyperactive  . Spironolactone     09/21/13 weakness in legs  . Singulair [Montelukast Sodium]     "spacey"; ineffective    Allergies as of 10/26/2020      Reactions   Amitid [amitriptyline Hcl]  Patient experienced a dizzy, spinning feeling    Metronidazole    REACTION: throat swells   Penicillins    REACTION: rash & fever   Alendronate Sodium    REACTION: unspecified   Amoxicillin Nausea And Vomiting   Clarithromycin    GI intolerance   Flonase [fluticasone Propionate]    Slight Headache    Promethazine Hcl    REACTION: unspecified   Robitussin (alcohol Free) [guaifenesin] Other (See Comments)   unknown hyperactive   Spironolactone    09/21/13 weakness in legs   Singulair  [montelukast Sodium]    "spacey"; ineffective      Medication List       Accurate as of October 26, 2020 11:59 PM. If you have any questions, ask your nurse or doctor.        acetaminophen 500 MG tablet Commonly known as: TYLENOL Take 1,000 mg by mouth every 6 (six) hours as needed for moderate pain.   acetaminophen 325 MG tablet Commonly known as: TYLENOL Take 650 mg by mouth in the morning and at bedtime. Not to exceed 3000 mg per 24 hours.   amLODipine 5 MG tablet Commonly known as: NORVASC TAKE 1 TABLET BY MOUTH EVERY DAY IN THE EVENING What changed:   how much to take  how to take this  when to take this   aspirin EC 81 MG tablet Take 2 tablets (162 mg total) by mouth 2 (two) times daily after a meal.   Biofreeze 4 % Gel Generic drug: Menthol (Topical Analgesic) Apply 1 application topically 2 (two) times daily as needed (left knee pain).   docusate sodium 100 MG capsule Commonly known as: COLACE Take 1 capsule (100 mg total) by mouth 2 (two) times daily as needed (for constipation).   ferrous sulfate 325 (65 FE) MG tablet Take 325 mg by mouth daily with breakfast. Once A Day on Mon, Wed, Fri   fexofenadine 180 MG tablet Commonly known as: ALLEGRA Take 180 mg by mouth daily as needed for allergies or rhinitis.   furosemide 40 MG tablet Commonly known as: LASIX Take 1 tablet (40 mg total) by mouth daily.   HYDROcodone-acetaminophen 5-325 MG tablet Commonly known as: NORCO/VICODIN Take 1 tablet by mouth every 6 (six) hours as needed for moderate pain. Start date : 10/09/20   lactose free nutrition Liqd Take 237 mLs by mouth daily.   magnesium hydroxide 400 MG/5ML suspension Commonly known as: MILK OF MAGNESIA Take 30 mLs by mouth daily as needed for mild constipation.   metoprolol succinate 25 MG 24 hr tablet Commonly known as: TOPROL-XL TAKE 1.5 TABLETS (37.5 MG TOTAL) BY MOUTH DAILY.   multivitamin tablet Take 1 tablet by mouth daily.  chewable   omeprazole 20 MG capsule Commonly known as: PRILOSEC Take 20 mg by mouth daily.   oxycodone 5 MG capsule Commonly known as: OXY-IR Take 5 mg by mouth in the morning, at noon, and at bedtime.   polyethylene glycol 17 g packet Commonly known as: MIRALAX / GLYCOLAX Take 17 g by mouth daily.   ROBAXIN PO Take 250 mg by mouth every 8 (eight) hours as needed.       Review of Systems  Constitutional: Negative for activity change, appetite change and fever.  HENT: Positive for hearing loss. Negative for congestion, trouble swallowing and voice change.   Eyes: Negative for visual disturbance.  Respiratory: Negative for cough, shortness of breath and wheezing.   Cardiovascular: Positive for leg swelling.  Gastrointestinal: Positive for  constipation. Negative for abdominal pain, blood in stool, nausea and vomiting.       GERD  Genitourinary: Negative for difficulty urinating, dysuria, hematuria and urgency.       Average nocturnal urination 2x/night.   Musculoskeletal: Positive for arthralgias and gait problem.       R hip pain is chronic, surgery about a year ago. Left hip pain, s/p hemiarthroplasty.   Skin: Positive for pallor. Negative for color change.  Neurological: Negative for speech difficulty, weakness, light-headedness and headaches.  Psychiatric/Behavioral: Negative for behavioral problems and sleep disturbance. The patient is not nervous/anxious.     Immunization History  Administered Date(s) Administered  . Fluad Quad(high Dose 65+) 10/05/2019  . Influenza Split 12/10/2011, 09/29/2012  . Influenza, High Dose Seasonal PF 09/21/2014, 08/27/2016, 08/27/2017, 09/23/2018  . Influenza,inj,Quad PF,6+ Mos 10/08/2013, 09/12/2015  . PFIZER SARS-COV-2 Vaccination 02/28/2020, 03/29/2020  . Pneumococcal Conjugate-13 08/22/2015  . Pneumococcal Polysaccharide-23 11/23/2008  . Td 03/23/2004  . Tdap 07/12/2015   Pertinent  Health Maintenance Due  Topic Date Due  .  INFLUENZA VACCINE  07/30/2020  . DEXA SCAN  Completed  . PNA vac Low Risk Adult  Completed   Fall Risk  09/23/2018 09/19/2017 09/04/2016 08/20/2016 12/20/2014  Falls in the past year? _0   Comment - - Emmi Telephone Survey: data to providers prior to load - -   Functional Status Survey:    Vitals:   10/26/20 1326  BP: 138/74  Pulse: 77  Resp: 18  Temp: 98.3 F (36.8 C)  SpO2: 95%  Weight: 126 lb 3.2 oz (57.2 kg)  Height: _1  (1.626 m)   Body mass index is 21.66 kg/m. Physical Exam Vitals and nursing note reviewed.  Constitutional:      Appearance: Normal appearance.  HENT:     Head: Normocephalic and atraumatic.     Mouth/Throat:     Mouth: Mucous membranes are moist.  Eyes:     Extraocular Movements: Extraocular movements intact.     Conjunctiva/sclera: Conjunctivae normal.     Pupils: Pupils are equal, round, and reactive to light.  Cardiovascular:     Rate and Rhythm: Normal rate and regular rhythm.     Heart sounds: No murmur heard.   Pulmonary:     Effort: Pulmonary effort is normal.     Breath sounds: No rales.  Abdominal:     General: Bowel sounds are normal.     Palpations: Abdomen is soft.     Tenderness: There is no abdominal tenderness.  Musculoskeletal:        General: Tenderness present. No swelling, deformity or signs of injury.     Cervical back: Normal range of motion and neck supple.     Right lower leg: Edema present.     Left lower leg: Edema present.     Comments: Trace edema BLE. Left hip-s/p hemiarthroplasty   Skin:    General: Skin is warm and dry.     Coloration: Skin is pale.  Neurological:     General: No focal deficit present.     Mental Status: She is alert and oriented to person, place, and time. Mental status is at baseline.     Motor: No weakness.     Coordination: Coordination normal.     Gait: Gait abnormal.  Psychiatric:        Mood and Affect: Mood normal.        Behavior: Behavior normal.        Thought  Content: Thought content normal.        Judgment: Judgment normal.     Labs reviewed: Recent Labs    10/11/20 0340 10/11/20 0340 10/12/20 0020 10/12/20 0656 10/25/20 0000  NA 135   < > 131* 133* 140  K 3.3*   < > 4.0 3.8 3.8  CL 99   < > 98 98 101  CO2 27   < > 27 27 32*  GLUCOSE 103*  --  197* 148*  --   BUN 19   < > _0 CREATININE 0.47   < > 0.44 0.44 0.5  CALCIUM 9.0   < > 8.8* 8.9 8.9   < > = values in this interval not displayed.   Recent Labs    09/01/20 0000 10/10/20 1806 10/25/20 0000  AST 11* 14* 10*  ALT _1 ALKPHOS 135* 114 108  BILITOT  --  0.6  --   PROT  --  6.5  --   ALBUMIN 3.9 4.0 2.7*   Recent Labs    11/03/19 1411 05/11/20 0000 05/11/20 0000 09/01/20 0000 10/10/20 1806 10/10/20 1806 10/11/20 0340 10/11/20 0340 10/12/20 0656 10/13/20 0309 10/25/20 0000  WBC   < > 5.7   < > 6.8 8.9   < > 7.4   < > 12.1* 11.1* 5.8  NEUTROABS  --  3,523  --  4,481 7.4  --   --   --   --   --   --   HGB   < > 12.2   < > 12.3 13.3   < > 11.5*   < > 10.8* 10.5* 8.9*  HCT   < > 36   < > 37 40.9   < > 34.9*   < > 32.7* 32.5* 27*  MCV   < >  --   --   --  94.9   < > 92.6  --  94.2 93.9  --   PLT   < > 239   < > 322 353   < > 284   < > 287 273 375   < > = values in this interval not displayed.   Lab Results  Component Value Date   TSH 1.90 05/11/2020   Lab Results  Component Value Date   HGBA1C 5.5 07/12/2015   Lab Results  Component Value Date   CHOL 227 (H) 06/24/2013   HDL 89.10 06/24/2013   LDLDIRECT 114.7 06/24/2013   TRIG 80.0 06/24/2013   CHOLHDL 3 06/24/2013    Significant Diagnostic Results in last 30 days:  Pelvis Portable  Result Date: 10/11/2020 CLINICAL DATA:  Left hip hemiarthroplasty EXAM: PORTABLE PELVIS 1-2 VIEWS COMPARISON:  09/03/2019 pelvic intraoperative radiographs FINDINGS: Interval left hip hemiarthroplasty. Prior right hip hemiarthroplasty. No evidence of hardware fracture. No acute osseous fracture. No evidence  of hip dislocation on this single frontal view. Expected gas surrounding the left hip joint. IMPRESSION: Satisfactory immediate postoperative frontal view appearance status post left hip hemiarthroplasty. Electronically Signed   By: Ilona Sorrel M.D.   On: 10/11/2020 16:59   Chest Portable 1 View  Result Date: 10/10/2020 CLINICAL DATA:  Preoperative radiograph EXAM: PORTABLE CHEST 1 VIEW COMPARISON:  Radiograph 11/03/2019 FINDINGS: Calcified granuloma in the right lung with additional calcified right hilar lymph node compatible sequela of prior granulomatous disease unchanged from comparison imaging. Some chronically coarsened interstitial and bronchitic features are also similar to comparison. No consolidation, features of edema, pneumothorax, or effusion.  The aorta is calcified. The remaining cardiomediastinal contours are unremarkable. No acute osseous or soft tissue abnormality. Degenerative changes are present in the imaged spine and shoulders. IMPRESSION: 1. No acute cardiopulmonary abnormality. 2. Chronically coarsened interstitial and bronchitic features, similar to comparison imaging. 3. Right lung granuloma and calcified right hilar lymph node compatible with sequela of prior granulomatous disease. 4.  Aortic Atherosclerosis (ICD10-I70.0). Electronically Signed   By: Lovena Le M.D.   On: 10/10/2020 19:02   DG C-Arm 1-60 Min-No Report  Result Date: 10/11/2020 CLINICAL DATA:  Left hip arthroplasty EXAM: OPERATIVE LEFT HIP WITH PELVIS; DG C-ARM 1-60 MIN-NO REPORT COMPARISON:  Fine 420 intraoperative pelvic radiographs FLUOROSCOPY TIME:  Fluoroscopy Time:  0 minutes 10 seconds Number of Acquired Spot Images: 2 FINDINGS: Nondiagnostic spot fluoroscopic intraoperative of left hip radiographs demonstrate interval left hip hemiarthroplasty with no appreciable hip dislocation on these views. IMPRESSION: Intraoperative fluoroscopic guidance for left hip hemiarthroplasty. Electronically Signed   By:  Ilona Sorrel M.D.   On: 10/11/2020 16:58   DG HIP OPERATIVE UNILAT W OR W/O PELVIS LEFT  Result Date: 10/11/2020 CLINICAL DATA:  Left hip arthroplasty EXAM: OPERATIVE LEFT HIP WITH PELVIS; DG C-ARM 1-60 MIN-NO REPORT COMPARISON:  Fine 420 intraoperative pelvic radiographs FLUOROSCOPY TIME:  Fluoroscopy Time:  0 minutes 10 seconds Number of Acquired Spot Images: 2 FINDINGS: Nondiagnostic spot fluoroscopic intraoperative of left hip radiographs demonstrate interval left hip hemiarthroplasty with no appreciable hip dislocation on these views. IMPRESSION: Intraoperative fluoroscopic guidance for left hip hemiarthroplasty. Electronically Signed   By: Ilona Sorrel M.D.   On: 10/11/2020 16:58    Assessment/Plan: ANEMIA, MILD 10/24/20 wbc 5.8, Hgb 8.9, plt 375, Na 140, K 3.8, Bun 7, creat 0.49, eGFR 88, Fe 311m 3x/wk, CBC one week.    Slow transit constipation Will try MiraLax qd.   Displaced fracture of left femoral neck (HCC) S/p left bipolar hemiarthroplasty 10/12/20 for left hip fracture. Takes Tylenol, Norco, Methocarbamol for pain.   Essential hypertension HTN, blood pressure is controlled, on amlodipine 573mqd, Metoprolol 37.32m49md.    Venous (peripheral) insufficiency CHF/PVD, peripheral edema, on Furosemide 30m532m.   Esophageal reflux GERD, stable, on Omeprazole.     Family/ staff Communication: plan of care reviewed with the patient and charge nurse.   Labs/tests ordered:  CBC one week  Time spend 35 minutes.

## 2020-10-26 NOTE — Assessment & Plan Note (Signed)
S/p left bipolar hemiarthroplasty 10/12/20 for left hip fracture. Takes Tylenol, Norco, Methocarbamol for pain.

## 2020-10-26 NOTE — Assessment & Plan Note (Signed)
Will try MiraLax qd.  

## 2020-10-26 NOTE — Assessment & Plan Note (Signed)
CHF/PVD, peripheral edema, on Furosemide 40mg  qd.

## 2020-10-26 NOTE — Assessment & Plan Note (Signed)
HTN, blood pressure is controlled, on amlodipine 5mg  qd, Metoprolol 37.5mg  qd.

## 2020-10-26 NOTE — Assessment & Plan Note (Signed)
GERD, stable, on Omeprazole  

## 2020-10-27 ENCOUNTER — Encounter: Payer: Self-pay | Admitting: Nurse Practitioner

## 2020-10-30 NOTE — Progress Notes (Signed)
HPI: FU CHF and possible amyloidosis. Ptpreviously admitted with CHF symptoms following prior admission for repair of right hip fracture. CTA showed no pulmonary embolus,small bilateral pleural effusions, pulmonary edema and three-vessel coronary calcification. Echocardiogram 10/20showed normal LV function, moderate left ventricular hypertrophy, grade 2 diastolic dysfunction, severe left atrial enlargement; consider amyloidosis.  At previous office visit patient requested no further evaluation for amyloid which I felt was appropriate given her age. Had repair of left femoral neck fracture October 2021. Since last seen she denies dyspnea, chest pain, palpitations or syncope.  She does have chronic pedal edema left greater than right.  Current Outpatient Medications  Medication Sig Dispense Refill  . acetaminophen (TYLENOL) 325 MG tablet Take 650 mg by mouth in the morning and at bedtime. Not to exceed 3000 mg per 24 hours.    Marland Kitchen acetaminophen (TYLENOL) 500 MG tablet Take 1,000 mg by mouth every 6 (six) hours as needed for moderate pain.    Marland Kitchen amLODipine (NORVASC) 5 MG tablet TAKE 1 TABLET BY MOUTH EVERY DAY IN THE EVENING (Patient taking differently: Take 5 mg by mouth daily. ) 90 tablet 1  . aspirin EC 81 MG tablet Take 2 tablets (162 mg total) by mouth 2 (two) times daily after a meal. 60 tablet 0  . docusate sodium (COLACE) 100 MG capsule Take 1 capsule (100 mg total) by mouth 2 (two) times daily as needed (for constipation). 30 capsule 0  . ferrous sulfate 325 (65 FE) MG tablet Take 325 mg by mouth daily with breakfast. Once A Day on Mon, Wed, Fri    . fexofenadine (ALLEGRA) 180 MG tablet Take 180 mg by mouth daily as needed for allergies or rhinitis.     . furosemide (LASIX) 40 MG tablet Take 1 tablet (40 mg total) by mouth daily. 90 tablet 1  . HYDROcodone-acetaminophen (NORCO/VICODIN) 5-325 MG tablet Take 1 tablet by mouth every 6 (six) hours as needed for moderate pain. Start date :  10/09/20    . lactose free nutrition (BOOST PLUS) LIQD Take 237 mLs by mouth daily.    . Menthol, Topical Analgesic, (BIOFREEZE) 4 % GEL Apply 1 application topically 2 (two) times daily as needed (left knee pain).     . Methocarbamol (ROBAXIN PO) Take 250 mg by mouth every 8 (eight) hours as needed.    . metoprolol succinate (TOPROL-XL) 25 MG 24 hr tablet TAKE 1.5 TABLETS (37.5 MG TOTAL) BY MOUTH DAILY. 90 tablet 2  . Multiple Vitamin (MULTIVITAMIN) tablet Take 1 tablet by mouth daily. chewable    . omeprazole (PRILOSEC) 20 MG capsule Take 20 mg by mouth daily.    Marland Kitchen oxycodone (OXY-IR) 5 MG capsule Take 5 mg by mouth in the morning, at noon, and at bedtime.    Marland Kitchen oxycodone (OXY-IR) 5 MG capsule Take 5 mg by mouth in the morning and at bedtime.    . polyethylene glycol (MIRALAX / GLYCOLAX) 17 g packet Take 17 g by mouth daily.     No current facility-administered medications for this visit.     Past Medical History:  Diagnosis Date  . Aortic heart murmur    AS/AI on 2D ECHO ; SBE prophylaxis Rxed  . Chest pain 2007 ; 2011   negative cardiac assessment  . GERD (gastroesophageal reflux disease)   . Hyperlipidemia   . Hypertension [  . Interstitial cystitis    Resolved  . Ocular migraine 04/10/2012  . Osteoporosis    T score -  3.0 @ femoral neck; Fosamax affected swallowing  . Peripheral vascular disease ([place])   . Postmenopausal    No PMH or HRT    Past Surgical History:  Procedure Laterality Date  . ANTERIOR APPROACH HEMI HIP ARTHROPLASTY Right 09/03/2019   Procedure: ANTERIOR APPROACH HEMI HIP ARTHROPLASTY BIPOLAR;  Surgeon: [name], MD;  Location: [facility];  Service: Orthopedics;  Laterality: Right;  . COLONOSCOPY  2011   Dr Sharlett Iles  . HIP ARTHROPLASTY Left 10/11/2020   Procedure: ARTHROPLASTY BIPOLAR HIP (HEMIARTHROPLASTY) ANTERIOR APPROACH;  Surgeon: [name], MD;  Location: [facility];  Service: Orthopedics;  Laterality: Left;    Social History   Socioeconomic History   . Marital status: Widowed    Spouse name: Not on file  . Number of children: 1  . Years of education: Not on file  . Highest education level: Not on file  Occupational History  . Not on file  Tobacco Use  . Smoking status: Never Smoker  . Smokeless tobacco: Never Used  Substance and Sexual Activity  . Alcohol use: No  . Drug use: No  . Sexual activity: Not Currently    Birth control/protection: None  Other Topics Concern  . Not on file  Social History Narrative  . Not on file   Social Determinants of Health   Financial Resource Strain:   . Difficulty of Paying Living Expenses: Not on file  Food Insecurity:   . Worried About Charity fundraiser in the Last Year: Not on file  . Ran Out of Food in the Last Year: Not on file  Transportation Needs:   . Lack of Transportation (Medical): Not on file  . Lack of Transportation (Non-Medical): Not on file  Physical Activity:   . Days of Exercise per Week: Not on file  . Minutes of Exercise per Session: Not on file  Stress:   . Feeling of Stress : Not on file  Social Connections:   . Frequency of Communication with Friends and Family: Not on file  . Frequency of Social Gatherings with Friends and Family: Not on file  . Attends Religious Services: Not on file  . Active Member of Clubs or Organizations: Not on file  . Attends Archivist Meetings: Not on file  . Marital Status: Not on file  Intimate Partner Violence:   . Fear of Current or Ex-Partner: Not on file  . Emotionally Abused: Not on file  . Physically Abused: Not on file  . Sexually Abused: Not on file    Family History  Problem Relation Age of Onset  . Diabetes Father        Pancreatitic insufficiency post MVA  . Hypertension Mother   . Osteoporosis Mother   . Heart disease Mother        AF  . Thyroid disease Sister        hypothyroidism  . Coronary artery disease Sister        AF  . Stroke Sister 35       also pulmonary disease    ROS: no  fevers or chills, productive cough, hemoptysis, dysphasia, odynophagia, melena, hematochezia, dysuria, hematuria, rash, seizure activity, orthopnea, PND, pedal edema, claudication. Remaining systems are negative.  Physical Exam: Well-developed well-nourished in no acute distress.  Skin is warm and dry.  HEENT is normal.  Neck is supple.  Chest is clear to auscultation with normal expansion.  Cardiovascular exam is regular rate and rhythm.  Abdominal exam nontender or distended. No masses  palpated. Extremities show 1+ edema on the left. neuro grossly intact   A/P  1 chronic diastolic congestive heart failure-she is euvolemic today on examination. We will continue with Lasix at present dose. Continue fluid restriction and low-sodium diet.  2 possible amyloid-this was questioned on prior echocardiogram. However given her age she wanted conservative measures with no aggressive evaluation. I think this is appropriate.  3 palpitations-no recurrent symptoms. We will consider a monitor in the future if needed.  4 hypertension-blood pressure controlled. Continue present medications and follow.  5 coronary artery disease-based on calcification noted on CT scan. Continue aspirin.  Kirk Ruths, MD

## 2020-10-31 LAB — CBC AND DIFFERENTIAL
HCT: 26 — AB (ref 36–46)
Hemoglobin: 8.9 — AB (ref 12.0–16.0)
WBC: 4.4

## 2020-10-31 LAB — CBC: RBC: 2.82 — AB (ref 3.87–5.11)

## 2020-11-02 ENCOUNTER — Encounter: Payer: Self-pay | Admitting: Nurse Practitioner

## 2020-11-02 ENCOUNTER — Non-Acute Institutional Stay (SKILLED_NURSING_FACILITY): Payer: MEDICARE | Admitting: Nurse Practitioner

## 2020-11-02 DIAGNOSIS — D649 Anemia, unspecified: Secondary | ICD-10-CM

## 2020-11-02 DIAGNOSIS — K219 Gastro-esophageal reflux disease without esophagitis: Secondary | ICD-10-CM

## 2020-11-02 DIAGNOSIS — I10 Essential (primary) hypertension: Secondary | ICD-10-CM | POA: Diagnosis not present

## 2020-11-02 DIAGNOSIS — F419 Anxiety disorder, unspecified: Secondary | ICD-10-CM

## 2020-11-02 DIAGNOSIS — I872 Venous insufficiency (chronic) (peripheral): Secondary | ICD-10-CM | POA: Diagnosis not present

## 2020-11-02 DIAGNOSIS — M25552 Pain in left hip: Secondary | ICD-10-CM | POA: Diagnosis not present

## 2020-11-02 DIAGNOSIS — F32A Depression, unspecified: Secondary | ICD-10-CM | POA: Diagnosis not present

## 2020-11-02 DIAGNOSIS — K5901 Slow transit constipation: Secondary | ICD-10-CM

## 2020-11-02 DIAGNOSIS — R635 Abnormal weight gain: Secondary | ICD-10-CM

## 2020-11-02 NOTE — Assessment & Plan Note (Addendum)
Life long issue, saw Psych today, continue supportive care

## 2020-11-02 NOTE — Assessment & Plan Note (Signed)
Stable, continue MiraLax.  °

## 2020-11-02 NOTE — Assessment & Plan Note (Signed)
S/p left bipolar hemiarthroplasty 10/12/20 for left hip fracture. Takes Tylenol, Oxycodone, Methocarbamol for pain. Walking with therapy. 10/24/20 Ortho WBAT

## 2020-11-02 NOTE — Progress Notes (Signed)
Location:    [facility] Room Number: 30 Place of Service:  SNF (31) Provider: Lennie Odor Jakiya Bookbinder NP  [name], MD  Patient Care Team: [name], MD as PCP - General (Internal Medicine) [name], MD as Consulting Physician (Orthopedic Surgery)  Extended Emergency Contact Information Primary Emergency Contact: [name] Mobile Phone: [phone] Relation: Friend Secondary Emergency Contact: cook,carolyn Mobile Phone: [phone] Relation: Friend Preferred language: English  Code Status:  DNR Goals of care: Advanced Directive information Advanced Directives 10/11/2020  Does Patient Have a Medical Advance Directive? Yes  Type of Paramedic of [place];Living will  Does patient want to make changes to medical advance directive? No - Patient declined  Copy of [place] in Chart? -  Would patient like information on creating a medical advance directive? -  Pre-existing out of facility DNR order (yellow form or pink MOST form) -     Chief Complaint  Patient presents with  . Medical Management of Chronic Issues    HPI:  Pt is a 86 y.o. female seen today for medical management of chronic diseases.    Anemia, Hgb 8.9 10/24/20, takes Fe.   S/p left bipolar hemiarthroplasty 10/12/20 for left hip fracture. Takes Tylenol, Oxycodone, Methocarbamol for pain. Walking with therapy. 10/24/20 Ortho WBAT             HTN, blood pressure is controlled, on amlodipine 5mg  qd, Metoprolol 37.5mg  qd.  CHF/PVD, peripheral edema, on Furosemide 40mg  qd.  GERD, stable, on Omeprazole.  Depression/anxiety, lifelong issue  Constipation, takes MiraLax qd   Past Medical History:  Diagnosis Date  . Aortic heart murmur    AS/AI on 2D ECHO ; SBE prophylaxis Rxed  . Chest pain 2007 ; 2011   negative cardiac assessment  . GERD (gastroesophageal reflux disease)   . Hyperlipidemia   .  Hypertension [  . Interstitial cystitis    Resolved  . Ocular migraine 04/10/2012  . Osteoporosis    T score - 3.0 @ femoral neck; Fosamax affected swallowing  . Peripheral vascular disease ([place])   . Postmenopausal    No PMH or HRT   Past Surgical History:  Procedure Laterality Date  . ANTERIOR APPROACH HEMI HIP ARTHROPLASTY Right 09/03/2019   Procedure: ANTERIOR APPROACH HEMI HIP ARTHROPLASTY BIPOLAR;  Surgeon: [name], MD;  Location: [facility];  Service: Orthopedics;  Laterality: Right;  . COLONOSCOPY  2011   Dr Sharlett Iles  . HIP ARTHROPLASTY Left 10/11/2020   Procedure: ARTHROPLASTY BIPOLAR HIP (HEMIARTHROPLASTY) ANTERIOR APPROACH;  Surgeon: [name], MD;  Location: [facility];  Service: Orthopedics;  Laterality: Left;    Allergies  Allergen Reactions  . Amitid [Amitriptyline Hcl]     Patient experienced a dizzy, spinning feeling   . Metronidazole     REACTION: throat swells  . Penicillins     REACTION: rash & fever  . Alendronate Sodium     REACTION: unspecified  . Amoxicillin Nausea And Vomiting  . Clarithromycin     GI intolerance  . Flonase [Fluticasone Propionate]     Slight Headache   . Promethazine Hcl     REACTION: unspecified  . Robitussin (Alcohol Free) [Guaifenesin] Other (See Comments)    unknown hyperactive  . Spironolactone     09/21/13 weakness in legs  . Singulair [Montelukast Sodium]     "spacey"; ineffective    Allergies as of 11/02/2020      Reactions   Amitid [amitriptyline Hcl]  Patient experienced a dizzy, spinning feeling    Metronidazole    REACTION: throat swells   Penicillins    REACTION: rash & fever   Alendronate Sodium    REACTION: unspecified   Amoxicillin Nausea And Vomiting   Clarithromycin    GI intolerance   Flonase [fluticasone Propionate]    Slight Headache    Promethazine Hcl    REACTION: unspecified   Robitussin (alcohol Free) [guaifenesin] Other (See Comments)   unknown hyperactive   Spironolactone    09/21/13  weakness in legs   Singulair [montelukast Sodium]    "spacey"; ineffective      Medication List       Accurate as of November 02, 2020 11:59 PM. If you have any questions, ask your nurse or doctor.        acetaminophen 500 MG tablet Commonly known as: TYLENOL Take 1,000 mg by mouth every 6 (six) hours as needed for moderate pain.   acetaminophen 325 MG tablet Commonly known as: TYLENOL Take 650 mg by mouth in the morning and at bedtime. Not to exceed 3000 mg per 24 hours.   amLODipine 5 MG tablet Commonly known as: NORVASC TAKE 1 TABLET BY MOUTH EVERY DAY IN THE EVENING What changed:   how much to take  how to take this  when to take this   aspirin EC 81 MG tablet Take 2 tablets (162 mg total) by mouth 2 (two) times daily after a meal.   Biofreeze 4 % Gel Generic drug: Menthol (Topical Analgesic) Apply 1 application topically 2 (two) times daily as needed (left knee pain).   docusate sodium 100 MG capsule Commonly known as: COLACE Take 1 capsule (100 mg total) by mouth 2 (two) times daily as needed (for constipation).   ferrous sulfate 325 (65 FE) MG tablet Take 325 mg by mouth daily with breakfast. Once A Day on Mon, Wed, Fri   fexofenadine 180 MG tablet Commonly known as: ALLEGRA Take 180 mg by mouth daily as needed for allergies or rhinitis.   furosemide 40 MG tablet Commonly known as: LASIX Take 1 tablet (40 mg total) by mouth daily.   HYDROcodone-acetaminophen 5-325 MG tablet Commonly known as: NORCO/VICODIN Take 1 tablet by mouth every 6 (six) hours as needed for moderate pain. Start date : 10/09/20   lactose free nutrition Liqd Take 237 mLs by mouth daily.   metoprolol succinate 25 MG 24 hr tablet Commonly known as: TOPROL-XL TAKE 1.5 TABLETS (37.5 MG TOTAL) BY MOUTH DAILY.   multivitamin tablet Take 1 tablet by mouth daily. chewable   omeprazole 20 MG capsule Commonly known as: PRILOSEC Take 20 mg by mouth daily.   oxycodone 5 MG  capsule Commonly known as: OXY-IR Take 5 mg by mouth in the morning and at bedtime.   oxycodone 5 MG capsule Commonly known as: OXY-IR Take 5 mg by mouth in the morning, at noon, and at bedtime.   polyethylene glycol 17 g packet Commonly known as: MIRALAX / GLYCOLAX Take 17 g by mouth daily.   ROBAXIN PO Take 250 mg by mouth every 8 (eight) hours as needed.       Review of Systems  Constitutional: Positive for unexpected weight change. Negative for appetite change, fatigue and fever.       #10Ibs weight gained in one week.   HENT: Positive for hearing loss. Negative for congestion and trouble swallowing.   Eyes: Negative for visual disturbance.  Respiratory: Negative for cough, shortness of breath and  wheezing.   Cardiovascular: Positive for leg swelling.  Gastrointestinal: Negative for abdominal pain and constipation.       GERD  Genitourinary: Negative for difficulty urinating, dysuria, hematuria and urgency.       Average nocturnal urination 2x/night.   Musculoskeletal: Positive for arthralgias and gait problem.       R hip pain is chronic, surgery about a year ago. Left hip pain, s/p hemiarthroplasty, WBAT  Skin: Positive for pallor. Negative for color change.  Neurological: Negative for dizziness, speech difficulty and weakness.  Psychiatric/Behavioral: Negative for behavioral problems and sleep disturbance. The patient is not nervous/anxious.     Immunization History  Administered Date(s) Administered  . Fluad Quad(high Dose 65+) 10/05/2019  . Influenza Split 12/10/2011, 09/29/2012  . Influenza, High Dose Seasonal PF 09/21/2014, 08/27/2016, 08/27/2017, 09/23/2018  . Influenza,inj,Quad PF,6+ Mos 10/08/2013, 09/12/2015  . Influenza-Unspecified 10/27/2020  . PFIZER SARS-COV-2 Vaccination 02/28/2020, 03/29/2020  . Pneumococcal Conjugate-13 08/22/2015  . Pneumococcal Polysaccharide-23 11/23/2008  . Td 03/23/2004  . Tdap 07/12/2015   Pertinent  Health Maintenance Due   Topic Date Due  . INFLUENZA VACCINE  Completed  . DEXA SCAN  Completed  . PNA vac Low Risk Adult  Completed   Fall Risk  09/23/2018 09/19/2017 09/04/2016 08/20/2016 12/20/2014  Falls in the past year? No No No No No  Comment - - [facility] Telephone Survey: data to providers prior to load - -   Functional Status Survey:    Vitals:   11/02/20 1342  BP: (!) 156/70  Pulse: 94  Resp: 18  Temp: (!) 97 F (36.1 C)  SpO2: 94%  Weight: 136 lb 3.2 oz (61.8 kg)  Height: 5\' 4"  (1.626 m)   Body mass index is 23.38 kg/m. Physical Exam Vitals and nursing note reviewed.  Constitutional:      Appearance: Normal appearance.  HENT:     Head: Normocephalic and atraumatic.  Eyes:     Extraocular Movements: Extraocular movements intact.     Conjunctiva/sclera: Conjunctivae normal.     Pupils: Pupils are equal, round, and reactive to light.  Cardiovascular:     Rate and Rhythm: Normal rate and regular rhythm.     Heart sounds: No murmur heard.   Pulmonary:     Effort: Pulmonary effort is normal.     Breath sounds: No rales.  Abdominal:     General: Bowel sounds are normal.     Palpations: Abdomen is soft.     Tenderness: There is no abdominal tenderness.  Musculoskeletal:     Cervical back: Normal range of motion and neck supple.     Right lower leg: Edema present.     Left lower leg: Edema present.     Comments: Trace to 1+ edema BLE. Left hip-s/p hemiarthroplasty   Skin:    General: Skin is warm and dry.  Neurological:     General: No focal deficit present.     Mental Status: She is alert and oriented to person, place, and time. Mental status is at baseline.     Motor: No weakness.     Coordination: Coordination normal.     Gait: Gait abnormal.  Psychiatric:        Mood and Affect: Mood normal.        Behavior: Behavior normal.        Thought Content: Thought content normal.        Judgment: Judgment normal.     Comments: Admitted feeling depressed or sad at times, but  declined  antidepressant, stated she sleeps well, has supportive family, is happy at [facility].      Labs reviewed: Recent Labs    10/11/20 0340 10/11/20 0340 10/12/20 0020 10/12/20 0656 10/25/20 0000  NA 135   < > 131* 133* 140  K 3.3*   < > 4.0 3.8 3.8  CL 99   < > 98 98 101  CO2 27   < > 27 27 32*  GLUCOSE 103*  --  197* 148*  --   BUN 19   < > 16 12 7   CREATININE 0.47   < > 0.44 0.44 0.5  CALCIUM 9.0   < > 8.8* 8.9 8.9   < > = values in this interval not displayed.   Recent Labs    09/01/20 0000 10/10/20 1806 10/25/20 0000  AST 11* 14* 10*  ALT 11 16 10   ALKPHOS 135* 114 108  BILITOT  --  0.6  --   PROT  --  6.5  --   ALBUMIN 3.9 4.0 2.7*   Recent Labs    05/11/20 0000 05/11/20 0000 09/01/20 0000 09/01/20 0000 10/10/20 1806 10/10/20 1806 10/11/20 0340 10/11/20 0340 10/12/20 0656 10/13/20 0309 10/25/20 0000  WBC 5.7   < > 6.8  --  8.9   < > 7.4   < > 12.1* 11.1* 5.8  NEUTROABS 3,523  --  4,481  --  7.4  --   --   --   --   --   --   HGB 12.2   < > 12.3   < > 13.3   < > 11.5*   < > 10.8* 10.5* 8.9*  HCT 36   < > 37   < > 40.9   < > 34.9*   < > 32.7* 32.5* 27*  MCV  --   --   --   --  94.9   < > 92.6  --  94.2 93.9  --   PLT 239   < > 322   < > 353   < > 284   < > 287 273 375   < > = values in this interval not displayed.   Lab Results  Component Value Date   TSH 1.90 05/11/2020   Lab Results  Component Value Date   HGBA1C 5.5 07/12/2015   Lab Results  Component Value Date   CHOL 227 (H) 06/24/2013   HDL 89.10 06/24/2013   LDLDIRECT 114.7 06/24/2013   TRIG 80.0 06/24/2013   CHOLHDL 3 06/24/2013    Significant Diagnostic Results in last 30 days:  Pelvis Portable  Result Date: 10/11/2020 CLINICAL DATA:  Left hip hemiarthroplasty EXAM: PORTABLE PELVIS 1-2 VIEWS COMPARISON:  09/03/2019 pelvic intraoperative radiographs FINDINGS: Interval left hip hemiarthroplasty. Prior right hip hemiarthroplasty. No evidence of hardware fracture. No acute osseous  fracture. No evidence of hip dislocation on this single frontal view. Expected gas surrounding the left hip joint. IMPRESSION: Satisfactory immediate postoperative frontal view appearance status post left hip hemiarthroplasty. Electronically Signed   By: Ilona Sorrel M.D.   On: 10/11/2020 16:59   Chest Portable 1 View  Result Date: 10/10/2020 CLINICAL DATA:  Preoperative radiograph EXAM: PORTABLE CHEST 1 VIEW COMPARISON:  Radiograph 11/03/2019 FINDINGS: Calcified granuloma in the right lung with additional calcified right hilar lymph node compatible sequela of prior granulomatous disease unchanged from comparison imaging. Some chronically coarsened interstitial and bronchitic features are also similar to comparison. No consolidation, features of edema, pneumothorax, or effusion.  The aorta is calcified. The remaining cardiomediastinal contours are unremarkable. No acute osseous or soft tissue abnormality. Degenerative changes are present in the imaged spine and shoulders. IMPRESSION: 1. No acute cardiopulmonary abnormality. 2. Chronically coarsened interstitial and bronchitic features, similar to comparison imaging. 3. Right lung granuloma and calcified right hilar lymph node compatible with sequela of prior granulomatous disease. 4.  Aortic Atherosclerosis (ICD10-I70.0). Electronically Signed   By: Lovena Le M.D.   On: 10/10/2020 19:02   DG C-Arm 1-60 Min-No Report  Result Date: 10/11/2020 CLINICAL DATA:  Left hip arthroplasty EXAM: OPERATIVE LEFT HIP WITH PELVIS; DG C-ARM 1-60 MIN-NO REPORT COMPARISON:  Fine 420 intraoperative pelvic radiographs FLUOROSCOPY TIME:  Fluoroscopy Time:  0 minutes 10 seconds Number of Acquired Spot Images: 2 FINDINGS: Nondiagnostic spot fluoroscopic intraoperative of left hip radiographs demonstrate interval left hip hemiarthroplasty with no appreciable hip dislocation on these views. IMPRESSION: Intraoperative fluoroscopic guidance for left hip hemiarthroplasty.  Electronically Signed   By: Ilona Sorrel M.D.   On: 10/11/2020 16:58   DG HIP OPERATIVE UNILAT W OR W/O PELVIS LEFT  Result Date: 10/11/2020 CLINICAL DATA:  Left hip arthroplasty EXAM: OPERATIVE LEFT HIP WITH PELVIS; DG C-ARM 1-60 MIN-NO REPORT COMPARISON:  Fine 420 intraoperative pelvic radiographs FLUOROSCOPY TIME:  Fluoroscopy Time:  0 minutes 10 seconds Number of Acquired Spot Images: 2 FINDINGS: Nondiagnostic spot fluoroscopic intraoperative of left hip radiographs demonstrate interval left hip hemiarthroplasty with no appreciable hip dislocation on these views. IMPRESSION: Intraoperative fluoroscopic guidance for left hip hemiarthroplasty. Electronically Signed   By: Ilona Sorrel M.D.   On: 10/11/2020 16:58    Assessment/Plan  Anxiety and depression Life long issue, saw Psych today, continue supportive care  ANEMIA, MILD Hgb 8.9 10/24/20, takes Fe. Pending f/u CBC   Left hip pain S/p left bipolar hemiarthroplasty 10/12/20 for left hip fracture. Takes Tylenol, Oxycodone, Methocarbamol for pain. Walking with therapy. 10/24/20 Ortho WBAT  Essential hypertension HTN, blood pressure is controlled, on amlodipine 5mg  qd, Metoprolol 37.5mg  qd.    Venous (peripheral) insufficiency CHF/PVD, peripheral edema, on Furosemide 40mg  qd.    Esophageal reflux Stable, continue Omeprazole.   Slow transit constipation Stable, continue MiraLax.   Weight gain #10Ibs weight gained in one week, ? Accurate,  the patient does have edema BLE, but no significant changes, denied SOB, cough, chest pain/pressure, palpitation. Will monitor wt 2x/wk ac breakfast, observe.    Family/ staff Communication: plan of care reviewed with the patient and charge nurse.   Labs/tests ordered: none  Time spend 35 minutes.

## 2020-11-02 NOTE — Assessment & Plan Note (Signed)
HTN, blood pressure is controlled, on amlodipine 5mg  qd, Metoprolol 37.5mg  qd.

## 2020-11-02 NOTE — Assessment & Plan Note (Signed)
Stable, continue Omeprazole.  

## 2020-11-02 NOTE — Assessment & Plan Note (Signed)
CHF/PVD, peripheral edema, on Furosemide 40mg  qd.

## 2020-11-02 NOTE — Assessment & Plan Note (Signed)
Hgb 8.9 10/24/20, takes Fe. Pending f/u CBC

## 2020-11-03 ENCOUNTER — Encounter: Payer: Self-pay | Admitting: Nurse Practitioner

## 2020-11-03 DIAGNOSIS — R635 Abnormal weight gain: Secondary | ICD-10-CM | POA: Insufficient documentation

## 2020-11-03 NOTE — Assessment & Plan Note (Addendum)
#  10Ibs weight gained in one week, ? Accurate,  the patient does have edema BLE, but no significant changes, denied SOB, cough, chest pain/pressure, palpitation. Will monitor wt 2x/wk ac breakfast, observe.

## 2020-11-06 ENCOUNTER — Other Ambulatory Visit: Payer: Self-pay

## 2020-11-06 ENCOUNTER — Encounter: Payer: Self-pay | Admitting: Cardiology

## 2020-11-06 ENCOUNTER — Ambulatory Visit (INDEPENDENT_AMBULATORY_CARE_PROVIDER_SITE_OTHER): Payer: MEDICARE | Admitting: Cardiology

## 2020-11-06 VITALS — BP 142/64 | HR 74 | Temp 97.5°F | Ht 64.0 in | Wt 128.0 lb

## 2020-11-06 DIAGNOSIS — I5032 Chronic diastolic (congestive) heart failure: Secondary | ICD-10-CM

## 2020-11-06 DIAGNOSIS — R002 Palpitations: Secondary | ICD-10-CM

## 2020-11-07 ENCOUNTER — Non-Acute Institutional Stay (SKILLED_NURSING_FACILITY): Payer: MEDICARE | Admitting: Internal Medicine

## 2020-11-07 ENCOUNTER — Encounter: Payer: Self-pay | Admitting: Internal Medicine

## 2020-11-07 DIAGNOSIS — I10 Essential (primary) hypertension: Secondary | ICD-10-CM | POA: Diagnosis not present

## 2020-11-07 DIAGNOSIS — F419 Anxiety disorder, unspecified: Secondary | ICD-10-CM

## 2020-11-07 DIAGNOSIS — D649 Anemia, unspecified: Secondary | ICD-10-CM

## 2020-11-07 DIAGNOSIS — Z96649 Presence of unspecified artificial hip joint: Secondary | ICD-10-CM

## 2020-11-07 DIAGNOSIS — M79662 Pain in left lower leg: Secondary | ICD-10-CM

## 2020-11-07 DIAGNOSIS — L03116 Cellulitis of left lower limb: Secondary | ICD-10-CM

## 2020-11-07 DIAGNOSIS — M7989 Other specified soft tissue disorders: Secondary | ICD-10-CM

## 2020-11-07 DIAGNOSIS — M810 Age-related osteoporosis without current pathological fracture: Secondary | ICD-10-CM

## 2020-11-07 DIAGNOSIS — F32A Depression, unspecified: Secondary | ICD-10-CM

## 2020-11-07 NOTE — Progress Notes (Signed)
Location:    [facility] Room Number: 30 Place of Service:  SNF [phone])  Provider: Veleta Miners MD   PCP: [name], MD Patient Care Team: [name], MD as PCP - General (Internal Medicine) [name], MD as Consulting Physician (Orthopedic Surgery)  Extended Emergency Contact Information Primary Emergency Contact: [name] Mobile Phone: [phone] Relation: Friend Secondary Emergency Contact: cook,carolyn Mobile Phone: [phone] Relation: Friend Preferred language: Cleophus Molt  Code Status: DNR Goals of care:  Advanced Directive information Advanced Directives 11/07/2020  Does Patient Have a Medical Advance Directive? Yes  Type of Paramedic of [place];Living will;Out of facility DNR (pink MOST or yellow form)  Does patient want to make changes to medical advance directive? No - Patient declined  Copy of [place] in Chart? Yes - validated most recent copy scanned in chart (See row information)  Would patient like information on creating a medical advance directive? -  Pre-existing out of facility DNR order (yellow form or pink MOST form) Pink MOST form placed in chart (order not valid for inpatient use);Yellow form placed in chart (order not valid for inpatient use)     Allergies  Allergen Reactions  . Amitid [Amitriptyline Hcl]     Patient experienced a dizzy, spinning feeling   . Metronidazole     REACTION: throat swells  . Penicillins     REACTION: rash & fever  . Alendronate Sodium     REACTION: unspecified  . Amoxicillin Nausea And Vomiting  . Clarithromycin     GI intolerance  . Flonase [Fluticasone Propionate]     Slight Headache   . Promethazine Hcl     REACTION: unspecified  . Robitussin (Alcohol Free) [Guaifenesin] Other (See Comments)    unknown hyperactive  . Spironolactone     09/21/13 weakness in legs  . Singulair [Montelukast Sodium]     "spacey"; ineffective      Chief Complaint  Patient presents with  . Discharge Note    Discharge from SNF    HPI:  86 y.o. female  Seen for Dis[place] from [place]F to [place]  Has history of diastolic CHF with echo showing LVH and severe left atrial enlargement possible amyloidosis, hypertension, history of palpitations,CAD,history of right hip fracture in 9/20, osteoporosis and anemia,hypertensive retinopathy Recent Calcaneal Non displaced Fracture of Right Foot  Was admitted and underwent left bipolar hemiarthroplasty on 10/14 -Postop course was uneventful  Admitted in SNF for therapy.  Her pain was controlled with oxycodone 3 times a day.  Patient is now walking able to do her transfers and able to do her ADLs.  Plan is for her to go back to her room in [place] But today when I saw patient she had swelling and redness with pain in her left lower extremity.  Denies any chest pain or shortness of breath  Past Medical History:  Diagnosis Date  . Aortic heart murmur    AS/AI on 2D ECHO ; SBE prophylaxis Rxed  . Chest pain 2007 ; 2011   negative cardiac assessment  . GERD (gastroesophageal reflux disease)   . Hyperlipidemia   . Hypertension [  . Interstitial cystitis    Resolved  . Ocular migraine 04/10/2012  . Osteoporosis    T score - 3.0 @ femoral neck; Fosamax affected swallowing  . Peripheral vascular disease ([place])   . Postmenopausal    No PMH or HRT    Past Surgical History:  Procedure Laterality Date  .  ANTERIOR APPROACH HEMI HIP ARTHROPLASTY Right 09/03/2019   Procedure: ANTERIOR APPROACH HEMI HIP ARTHROPLASTY BIPOLAR;  Surgeon: [name], MD;  Location: [facility];  Service: Orthopedics;  Laterality: Right;  . COLONOSCOPY  2011   Dr Sharlett Iles  . HIP ARTHROPLASTY Left 10/11/2020   Procedure: ARTHROPLASTY BIPOLAR HIP (HEMIARTHROPLASTY) ANTERIOR APPROACH;  Surgeon: [name], MD;  Location: [facility];  Service: Orthopedics;  Laterality: Left;      reports that she has never smoked. She has  never used smokeless tobacco. She reports that she does not drink alcohol and does not use drugs. Social History   Socioeconomic History  . Marital status: Widowed    Spouse name: Not on file  . Number of children: 1  . Years of education: Not on file  . Highest education level: Not on file  Occupational History  . Not on file  Tobacco Use  . Smoking status: Never Smoker  . Smokeless tobacco: Never Used  Substance and Sexual Activity  . Alcohol use: No  . Drug use: No  . Sexual activity: Not Currently    Birth control/protection: None  Other Topics Concern  . Not on file  Social History Narrative  . Not on file   Social Determinants of Health   Financial Resource Strain:   . Difficulty of Paying Living Expenses: Not on file  Food Insecurity:   . Worried About Charity fundraiser in the Last Year: Not on file  . Ran Out of Food in the Last Year: Not on file  Transportation Needs:   . Lack of Transportation (Medical): Not on file  . Lack of Transportation (Non-Medical): Not on file  Physical Activity:   . Days of Exercise per Week: Not on file  . Minutes of Exercise per Session: Not on file  Stress:   . Feeling of Stress : Not on file  Social Connections:   . Frequency of Communication with Friends and Family: Not on file  . Frequency of Social Gatherings with Friends and Family: Not on file  . Attends Religious Services: Not on file  . Active Member of Clubs or Organizations: Not on file  . Attends Archivist Meetings: Not on file  . Marital Status: Not on file  Intimate Partner Violence:   . Fear of Current or Ex-Partner: Not on file  . Emotionally Abused: Not on file  . Physically Abused: Not on file  . Sexually Abused: Not on file   Functional Status Survey:    Allergies  Allergen Reactions  . Amitid [Amitriptyline Hcl]     Patient experienced a dizzy, spinning feeling   . Metronidazole     REACTION: throat swells  . Penicillins     REACTION:  rash & fever  . Alendronate Sodium     REACTION: unspecified  . Amoxicillin Nausea And Vomiting  . Clarithromycin     GI intolerance  . Flonase [Fluticasone Propionate]     Slight Headache   . Promethazine Hcl     REACTION: unspecified  . Robitussin (Alcohol Free) [Guaifenesin] Other (See Comments)    unknown hyperactive  . Spironolactone     09/21/13 weakness in legs  . Singulair [Montelukast Sodium]     "spacey"; ineffective    Pertinent  Health Maintenance Due  Topic Date Due  . INFLUENZA VACCINE  Completed  . DEXA SCAN  Completed  . PNA vac Low Risk Adult  Completed    Medications: Allergies as of 11/07/2020  Reactions   Amitid [amitriptyline Hcl]    Patient experienced a dizzy, spinning feeling    Metronidazole    REACTION: throat swells   Penicillins    REACTION: rash & fever   Alendronate Sodium    REACTION: unspecified   Amoxicillin Nausea And Vomiting   Clarithromycin    GI intolerance   Flonase [fluticasone Propionate]    Slight Headache    Promethazine Hcl    REACTION: unspecified   Robitussin (alcohol Free) [guaifenesin] Other (See Comments)   unknown hyperactive   Spironolactone    09/21/13 weakness in legs   Singulair [montelukast Sodium]    "spacey"; ineffective      Medication List       Accurate as of November 07, 2020 10:34 AM. If you have any questions, ask your nurse or doctor.        acetaminophen 500 MG tablet Commonly known as: TYLENOL Take 1,000 mg by mouth every 6 (six) hours as needed for moderate pain.   acetaminophen 325 MG tablet Commonly known as: TYLENOL Take 650 mg by mouth in the morning and at bedtime. Not to exceed 3000 mg per 24 hours.   amLODipine 5 MG tablet Commonly known as: NORVASC TAKE 1 TABLET BY MOUTH EVERY DAY IN THE EVENING What changed:   how much to take  how to take this  when to take this   aspirin EC 81 MG tablet Take 2 tablets (162 mg total) by mouth 2 (two) times daily after a meal.    Biofreeze 4 % Gel Generic drug: Menthol (Topical Analgesic) Apply 1 application topically 2 (two) times daily as needed (left knee pain).   docusate sodium 100 MG capsule Commonly known as: COLACE Take 1 capsule (100 mg total) by mouth 2 (two) times daily as needed (for constipation).   ferrous sulfate 325 (65 FE) MG tablet Take 325 mg by mouth daily with breakfast. Once A Day on Mon, Wed, Fri   fexofenadine 180 MG tablet Commonly known as: ALLEGRA Take 180 mg by mouth daily as needed for allergies or rhinitis.   furosemide 40 MG tablet Commonly known as: LASIX Take 1 tablet (40 mg total) by mouth daily.   HYDROcodone-acetaminophen 5-325 MG tablet Commonly known as: NORCO/VICODIN Take 1 tablet by mouth every 6 (six) hours as needed for moderate pain. Start date : 10/09/20   lactose free nutrition Liqd Take 237 mLs by mouth daily.   metoprolol succinate 25 MG 24 hr tablet Commonly known as: TOPROL-XL TAKE 1.5 TABLETS (37.5 MG TOTAL) BY MOUTH DAILY.   multivitamin tablet Take 1 tablet by mouth daily. chewable   omeprazole 20 MG capsule Commonly known as: PRILOSEC Take 20 mg by mouth daily.   oxycodone 5 MG capsule Commonly known as: OXY-IR Take 5 mg by mouth in the morning and at bedtime.   polyethylene glycol 17 g packet Commonly known as: MIRALAX / GLYCOLAX Take 17 g by mouth daily.   potassium chloride SA 20 MEQ tablet Commonly known as: KLOR-CON Take 20 mEq by mouth daily.   ROBAXIN PO Take 250 mg by mouth every 8 (eight) hours as needed.       Review of Systems  Vitals:   11/07/20 1027  BP: (!) 150/64  Pulse: 74  Resp: 18  Temp: 98.6 F (37 C)  SpO2: 97%  Weight: 127 lb (57.6 kg)  Height: 5\' 4"  (1.626 m)   Body mass index is 21.8 kg/m. Physical Exam Vitals reviewed.  Constitutional:  Appearance: Normal appearance.  HENT:     Head: Normocephalic.     Nose: Nose normal.     Mouth/Throat:     Mouth: Mucous membranes are moist.      Pharynx: Oropharynx is clear.  Eyes:     Pupils: Pupils are equal, round, and reactive to light.  Cardiovascular:     Rate and Rhythm: Normal rate and regular rhythm.     Pulses: Normal pulses.     Heart sounds: Normal heart sounds.  Pulmonary:     Effort: Pulmonary effort is normal.     Breath sounds: Normal breath sounds.  Abdominal:     General: Abdomen is flat. Bowel sounds are normal.     Palpations: Abdomen is soft.  Musculoskeletal:     Cervical back: Neck supple.     Comments: Right leg no swelling Left leg had swelling was warm and had redness around the skin tear with tenderness  Skin:    General: Skin is warm.  Neurological:     General: No focal deficit present.     Mental Status: She is alert and oriented to person, place, and time.  Psychiatric:        Mood and Affect: Mood normal.        Thought Content: Thought content normal.     Labs reviewed: Basic Metabolic Panel: Recent Labs    10/11/20 0340 10/11/20 0340 10/12/20 0020 10/12/20 0656 10/25/20 0000  NA 135   < > 131* 133* 140  K 3.3*   < > 4.0 3.8 3.8  CL 99   < > 98 98 101  CO2 27   < > 27 27 32*  GLUCOSE 103*  --  197* 148*  --   BUN 19   < > 16 12 7   CREATININE 0.47   < > 0.44 0.44 0.5  CALCIUM 9.0   < > 8.8* 8.9 8.9   < > = values in this interval not displayed.   Liver Function Tests: Recent Labs    09/01/20 0000 10/10/20 1806 10/25/20 0000  AST 11* 14* 10*  ALT 11 16 10   ALKPHOS 135* 114 108  BILITOT  --  0.6  --   PROT  --  6.5  --   ALBUMIN 3.9 4.0 2.7*   No results for input(s): LIPASE, AMYLASE in the last 8760 hours. No results for input(s): AMMONIA in the last 8760 hours. CBC: Recent Labs    05/11/20 0000 05/11/20 0000 09/01/20 0000 09/01/20 0000 10/10/20 1806 10/10/20 1806 10/11/20 0340 10/11/20 0340 10/12/20 0656 10/12/20 0656 10/13/20 0309 10/25/20 0000 10/31/20 0000  WBC 5.7   < > 6.8  --  8.9   < > 7.4   < > 12.1*   < > 11.1* 5.8 4.4  NEUTROABS 3,523  --   4,481  --  7.4  --   --   --   --   --   --   --   --   HGB 12.2   < > 12.3   < > 13.3   < > 11.5*   < > 10.8*   < > 10.5* 8.9* 8.9*  HCT 36   < > 37   < > 40.9   < > 34.9*   < > 32.7*   < > 32.5* 27* 26*  MCV  --   --   --   --  94.9   < > 92.6  --  94.2  --  93.9  --   --   PLT 239   < > 322   < > 353   < > 284   < > 287  --  273 375  --    < > = values in this interval not displayed.   Cardiac Enzymes: No results for input(s): CKTOTAL, CKMB, CKMBINDEX, TROPONINI in the last 8760 hours. BNP: Invalid input(s): POCBNP CBG: No results for input(s): GLUCAP in the last 8760 hours.  Procedures and Imaging Studies During Stay: Pelvis Portable  Result Date: 10/11/2020 CLINICAL DATA:  Left hip hemiarthroplasty EXAM: PORTABLE PELVIS 1-2 VIEWS COMPARISON:  09/03/2019 pelvic intraoperative radiographs FINDINGS: Interval left hip hemiarthroplasty. Prior right hip hemiarthroplasty. No evidence of hardware fracture. No acute osseous fracture. No evidence of hip dislocation on this single frontal view. Expected gas surrounding the left hip joint. IMPRESSION: Satisfactory immediate postoperative frontal view appearance status post left hip hemiarthroplasty. Electronically Signed   By: Ilona Sorrel M.D.   On: 10/11/2020 16:59   Chest Portable 1 View  Result Date: 10/10/2020 CLINICAL DATA:  Preoperative radiograph EXAM: PORTABLE CHEST 1 VIEW COMPARISON:  Radiograph 11/03/2019 FINDINGS: Calcified granuloma in the right lung with additional calcified right hilar lymph node compatible sequela of prior granulomatous disease unchanged from comparison imaging. Some chronically coarsened interstitial and bronchitic features are also similar to comparison. No consolidation, features of edema, pneumothorax, or effusion. The aorta is calcified. The remaining cardiomediastinal contours are unremarkable. No acute osseous or soft tissue abnormality. Degenerative changes are present in the imaged spine and shoulders.  IMPRESSION: 1. No acute cardiopulmonary abnormality. 2. Chronically coarsened interstitial and bronchitic features, similar to comparison imaging. 3. Right lung granuloma and calcified right hilar lymph node compatible with sequela of prior granulomatous disease. 4.  Aortic Atherosclerosis (ICD10-I70.0). Electronically Signed   By: Lovena Le M.D.   On: 10/10/2020 19:02   DG C-Arm 1-60 Min-No Report  Result Date: 10/11/2020 CLINICAL DATA:  Left hip arthroplasty EXAM: OPERATIVE LEFT HIP WITH PELVIS; DG C-ARM 1-60 MIN-NO REPORT COMPARISON:  Fine 420 intraoperative pelvic radiographs FLUOROSCOPY TIME:  Fluoroscopy Time:  0 minutes 10 seconds Number of Acquired Spot Images: 2 FINDINGS: Nondiagnostic spot fluoroscopic intraoperative of left hip radiographs demonstrate interval left hip hemiarthroplasty with no appreciable hip dislocation on these views. IMPRESSION: Intraoperative fluoroscopic guidance for left hip hemiarthroplasty. Electronically Signed   By: Ilona Sorrel M.D.   On: 10/11/2020 16:58   DG HIP OPERATIVE UNILAT W OR W/O PELVIS LEFT  Result Date: 10/11/2020 CLINICAL DATA:  Left hip arthroplasty EXAM: OPERATIVE LEFT HIP WITH PELVIS; DG C-ARM 1-60 MIN-NO REPORT COMPARISON:  Fine 420 intraoperative pelvic radiographs FLUOROSCOPY TIME:  Fluoroscopy Time:  0 minutes 10 seconds Number of Acquired Spot Images: 2 FINDINGS: Nondiagnostic spot fluoroscopic intraoperative of left hip radiographs demonstrate interval left hip hemiarthroplasty with no appreciable hip dislocation on these views. IMPRESSION: Intraoperative fluoroscopic guidance for left hip hemiarthroplasty. Electronically Signed   By: Ilona Sorrel M.D.   On: 10/11/2020 16:58    Assessment/Plan:    Pain and swelling of left lower leg Dopplers of left leg  Cellulitis of left lower extremity Doxycyline 100 mg BID for 7 days  Essential hypertension Toprol and Norvasc  S/P hip hemiarthroplasty Pain Control with Oxycodone Changed it  to BID and PRN Robaxin Walking well with [place] Discharge back to [place] Change Aspirin to qd on 11/13  Anemia, unspecified type On Iron Repeat CBC and also Check B12  Anxiety and depression Does not  want  treatment yet  Age-related osteoporosis without current pathological fracture Would have to consider Prolia   Time spent more then 30 min Future labs/tests needed:

## 2020-11-08 DIAGNOSIS — L539 Erythematous condition, unspecified: Secondary | ICD-10-CM | POA: Diagnosis not present

## 2020-11-09 DIAGNOSIS — I10 Essential (primary) hypertension: Secondary | ICD-10-CM | POA: Diagnosis not present

## 2020-11-13 DIAGNOSIS — M6281 Muscle weakness (generalized): Secondary | ICD-10-CM | POA: Diagnosis not present

## 2020-11-13 DIAGNOSIS — S72002D Fracture of unspecified part of neck of left femur, subsequent encounter for closed fracture with routine healing: Secondary | ICD-10-CM | POA: Diagnosis not present

## 2020-11-13 DIAGNOSIS — R2681 Unsteadiness on feet: Secondary | ICD-10-CM | POA: Diagnosis not present

## 2020-11-13 DIAGNOSIS — R29898 Other symptoms and signs involving the musculoskeletal system: Secondary | ICD-10-CM | POA: Diagnosis not present

## 2020-11-13 DIAGNOSIS — M810 Age-related osteoporosis without current pathological fracture: Secondary | ICD-10-CM | POA: Diagnosis not present

## 2020-11-13 DIAGNOSIS — Z471 Aftercare following joint replacement surgery: Secondary | ICD-10-CM | POA: Diagnosis not present

## 2020-11-13 DIAGNOSIS — M25552 Pain in left hip: Secondary | ICD-10-CM | POA: Diagnosis not present

## 2020-11-14 DIAGNOSIS — M810 Age-related osteoporosis without current pathological fracture: Secondary | ICD-10-CM | POA: Diagnosis not present

## 2020-11-14 DIAGNOSIS — M6281 Muscle weakness (generalized): Secondary | ICD-10-CM | POA: Diagnosis not present

## 2020-11-14 DIAGNOSIS — M25552 Pain in left hip: Secondary | ICD-10-CM | POA: Diagnosis not present

## 2020-11-14 DIAGNOSIS — R2681 Unsteadiness on feet: Secondary | ICD-10-CM | POA: Diagnosis not present

## 2020-11-14 DIAGNOSIS — S72002D Fracture of unspecified part of neck of left femur, subsequent encounter for closed fracture with routine healing: Secondary | ICD-10-CM | POA: Diagnosis not present

## 2020-11-14 DIAGNOSIS — Z471 Aftercare following joint replacement surgery: Secondary | ICD-10-CM | POA: Diagnosis not present

## 2020-11-15 DIAGNOSIS — M25552 Pain in left hip: Secondary | ICD-10-CM | POA: Diagnosis not present

## 2020-11-15 DIAGNOSIS — Z471 Aftercare following joint replacement surgery: Secondary | ICD-10-CM | POA: Diagnosis not present

## 2020-11-15 DIAGNOSIS — M810 Age-related osteoporosis without current pathological fracture: Secondary | ICD-10-CM | POA: Diagnosis not present

## 2020-11-15 DIAGNOSIS — S72002D Fracture of unspecified part of neck of left femur, subsequent encounter for closed fracture with routine healing: Secondary | ICD-10-CM | POA: Diagnosis not present

## 2020-11-15 DIAGNOSIS — R2681 Unsteadiness on feet: Secondary | ICD-10-CM | POA: Diagnosis not present

## 2020-11-15 DIAGNOSIS — M6281 Muscle weakness (generalized): Secondary | ICD-10-CM | POA: Diagnosis not present

## 2020-11-16 DIAGNOSIS — Z471 Aftercare following joint replacement surgery: Secondary | ICD-10-CM | POA: Diagnosis not present

## 2020-11-16 DIAGNOSIS — M6281 Muscle weakness (generalized): Secondary | ICD-10-CM | POA: Diagnosis not present

## 2020-11-16 DIAGNOSIS — S72002D Fracture of unspecified part of neck of left femur, subsequent encounter for closed fracture with routine healing: Secondary | ICD-10-CM | POA: Diagnosis not present

## 2020-11-16 DIAGNOSIS — R2681 Unsteadiness on feet: Secondary | ICD-10-CM | POA: Diagnosis not present

## 2020-11-16 DIAGNOSIS — M25552 Pain in left hip: Secondary | ICD-10-CM | POA: Diagnosis not present

## 2020-11-16 DIAGNOSIS — M810 Age-related osteoporosis without current pathological fracture: Secondary | ICD-10-CM | POA: Diagnosis not present

## 2020-11-19 DIAGNOSIS — S72002D Fracture of unspecified part of neck of left femur, subsequent encounter for closed fracture with routine healing: Secondary | ICD-10-CM | POA: Diagnosis not present

## 2020-11-19 DIAGNOSIS — M810 Age-related osteoporosis without current pathological fracture: Secondary | ICD-10-CM | POA: Diagnosis not present

## 2020-11-19 DIAGNOSIS — R2681 Unsteadiness on feet: Secondary | ICD-10-CM | POA: Diagnosis not present

## 2020-11-19 DIAGNOSIS — M25552 Pain in left hip: Secondary | ICD-10-CM | POA: Diagnosis not present

## 2020-11-19 DIAGNOSIS — Z471 Aftercare following joint replacement surgery: Secondary | ICD-10-CM | POA: Diagnosis not present

## 2020-11-19 DIAGNOSIS — M6281 Muscle weakness (generalized): Secondary | ICD-10-CM | POA: Diagnosis not present

## 2020-11-20 DIAGNOSIS — M6281 Muscle weakness (generalized): Secondary | ICD-10-CM | POA: Diagnosis not present

## 2020-11-20 DIAGNOSIS — S72002D Fracture of unspecified part of neck of left femur, subsequent encounter for closed fracture with routine healing: Secondary | ICD-10-CM | POA: Diagnosis not present

## 2020-11-20 DIAGNOSIS — Z471 Aftercare following joint replacement surgery: Secondary | ICD-10-CM | POA: Diagnosis not present

## 2020-11-20 DIAGNOSIS — M25552 Pain in left hip: Secondary | ICD-10-CM | POA: Diagnosis not present

## 2020-11-20 DIAGNOSIS — R2681 Unsteadiness on feet: Secondary | ICD-10-CM | POA: Diagnosis not present

## 2020-11-20 DIAGNOSIS — M810 Age-related osteoporosis without current pathological fracture: Secondary | ICD-10-CM | POA: Diagnosis not present

## 2020-11-21 DIAGNOSIS — S72002D Fracture of unspecified part of neck of left femur, subsequent encounter for closed fracture with routine healing: Secondary | ICD-10-CM | POA: Diagnosis not present

## 2020-11-21 DIAGNOSIS — R2681 Unsteadiness on feet: Secondary | ICD-10-CM | POA: Diagnosis not present

## 2020-11-21 DIAGNOSIS — Z471 Aftercare following joint replacement surgery: Secondary | ICD-10-CM | POA: Diagnosis not present

## 2020-11-21 DIAGNOSIS — M6281 Muscle weakness (generalized): Secondary | ICD-10-CM | POA: Diagnosis not present

## 2020-11-21 DIAGNOSIS — M810 Age-related osteoporosis without current pathological fracture: Secondary | ICD-10-CM | POA: Diagnosis not present

## 2020-11-21 DIAGNOSIS — M25552 Pain in left hip: Secondary | ICD-10-CM | POA: Diagnosis not present

## 2020-11-22 DIAGNOSIS — M25552 Pain in left hip: Secondary | ICD-10-CM | POA: Diagnosis not present

## 2020-11-22 DIAGNOSIS — R2681 Unsteadiness on feet: Secondary | ICD-10-CM | POA: Diagnosis not present

## 2020-11-22 DIAGNOSIS — M6281 Muscle weakness (generalized): Secondary | ICD-10-CM | POA: Diagnosis not present

## 2020-11-22 DIAGNOSIS — Z471 Aftercare following joint replacement surgery: Secondary | ICD-10-CM | POA: Diagnosis not present

## 2020-11-22 DIAGNOSIS — S72002D Fracture of unspecified part of neck of left femur, subsequent encounter for closed fracture with routine healing: Secondary | ICD-10-CM | POA: Diagnosis not present

## 2020-11-22 DIAGNOSIS — M810 Age-related osteoporosis without current pathological fracture: Secondary | ICD-10-CM | POA: Diagnosis not present

## 2020-11-24 DIAGNOSIS — M810 Age-related osteoporosis without current pathological fracture: Secondary | ICD-10-CM | POA: Diagnosis not present

## 2020-11-24 DIAGNOSIS — R2681 Unsteadiness on feet: Secondary | ICD-10-CM | POA: Diagnosis not present

## 2020-11-24 DIAGNOSIS — M6281 Muscle weakness (generalized): Secondary | ICD-10-CM | POA: Diagnosis not present

## 2020-11-24 DIAGNOSIS — Z471 Aftercare following joint replacement surgery: Secondary | ICD-10-CM | POA: Diagnosis not present

## 2020-11-24 DIAGNOSIS — M25552 Pain in left hip: Secondary | ICD-10-CM | POA: Diagnosis not present

## 2020-11-24 DIAGNOSIS — S72002D Fracture of unspecified part of neck of left femur, subsequent encounter for closed fracture with routine healing: Secondary | ICD-10-CM | POA: Diagnosis not present

## 2020-11-27 DIAGNOSIS — R2681 Unsteadiness on feet: Secondary | ICD-10-CM | POA: Diagnosis not present

## 2020-11-27 DIAGNOSIS — M6281 Muscle weakness (generalized): Secondary | ICD-10-CM | POA: Diagnosis not present

## 2020-11-27 DIAGNOSIS — S72002D Fracture of unspecified part of neck of left femur, subsequent encounter for closed fracture with routine healing: Secondary | ICD-10-CM | POA: Diagnosis not present

## 2020-11-27 DIAGNOSIS — M810 Age-related osteoporosis without current pathological fracture: Secondary | ICD-10-CM | POA: Diagnosis not present

## 2020-11-27 DIAGNOSIS — M25552 Pain in left hip: Secondary | ICD-10-CM | POA: Diagnosis not present

## 2020-11-27 DIAGNOSIS — Z471 Aftercare following joint replacement surgery: Secondary | ICD-10-CM | POA: Diagnosis not present

## 2020-11-28 DIAGNOSIS — M25552 Pain in left hip: Secondary | ICD-10-CM | POA: Diagnosis not present

## 2020-11-28 DIAGNOSIS — Z471 Aftercare following joint replacement surgery: Secondary | ICD-10-CM | POA: Diagnosis not present

## 2020-11-28 DIAGNOSIS — S72002D Fracture of unspecified part of neck of left femur, subsequent encounter for closed fracture with routine healing: Secondary | ICD-10-CM | POA: Diagnosis not present

## 2020-11-28 DIAGNOSIS — M810 Age-related osteoporosis without current pathological fracture: Secondary | ICD-10-CM | POA: Diagnosis not present

## 2020-11-28 DIAGNOSIS — R2681 Unsteadiness on feet: Secondary | ICD-10-CM | POA: Diagnosis not present

## 2020-11-28 DIAGNOSIS — M6281 Muscle weakness (generalized): Secondary | ICD-10-CM | POA: Diagnosis not present

## 2020-11-30 DIAGNOSIS — M810 Age-related osteoporosis without current pathological fracture: Secondary | ICD-10-CM | POA: Diagnosis not present

## 2020-11-30 DIAGNOSIS — M6281 Muscle weakness (generalized): Secondary | ICD-10-CM | POA: Diagnosis not present

## 2020-11-30 DIAGNOSIS — S72002D Fracture of unspecified part of neck of left femur, subsequent encounter for closed fracture with routine healing: Secondary | ICD-10-CM | POA: Diagnosis not present

## 2020-11-30 DIAGNOSIS — R29898 Other symptoms and signs involving the musculoskeletal system: Secondary | ICD-10-CM | POA: Diagnosis not present

## 2020-11-30 DIAGNOSIS — M25552 Pain in left hip: Secondary | ICD-10-CM | POA: Diagnosis not present

## 2020-11-30 DIAGNOSIS — R2681 Unsteadiness on feet: Secondary | ICD-10-CM | POA: Diagnosis not present

## 2020-11-30 DIAGNOSIS — Z471 Aftercare following joint replacement surgery: Secondary | ICD-10-CM | POA: Diagnosis not present

## 2020-12-01 DIAGNOSIS — M810 Age-related osteoporosis without current pathological fracture: Secondary | ICD-10-CM | POA: Diagnosis not present

## 2020-12-01 DIAGNOSIS — S72002D Fracture of unspecified part of neck of left femur, subsequent encounter for closed fracture with routine healing: Secondary | ICD-10-CM | POA: Diagnosis not present

## 2020-12-01 DIAGNOSIS — R2681 Unsteadiness on feet: Secondary | ICD-10-CM | POA: Diagnosis not present

## 2020-12-01 DIAGNOSIS — M25552 Pain in left hip: Secondary | ICD-10-CM | POA: Diagnosis not present

## 2020-12-01 DIAGNOSIS — M6281 Muscle weakness (generalized): Secondary | ICD-10-CM | POA: Diagnosis not present

## 2020-12-01 DIAGNOSIS — Z471 Aftercare following joint replacement surgery: Secondary | ICD-10-CM | POA: Diagnosis not present

## 2020-12-04 DIAGNOSIS — M6281 Muscle weakness (generalized): Secondary | ICD-10-CM | POA: Diagnosis not present

## 2020-12-04 DIAGNOSIS — Z471 Aftercare following joint replacement surgery: Secondary | ICD-10-CM | POA: Diagnosis not present

## 2020-12-04 DIAGNOSIS — S72002D Fracture of unspecified part of neck of left femur, subsequent encounter for closed fracture with routine healing: Secondary | ICD-10-CM | POA: Diagnosis not present

## 2020-12-04 DIAGNOSIS — M25552 Pain in left hip: Secondary | ICD-10-CM | POA: Diagnosis not present

## 2020-12-04 DIAGNOSIS — M810 Age-related osteoporosis without current pathological fracture: Secondary | ICD-10-CM | POA: Diagnosis not present

## 2020-12-04 DIAGNOSIS — R2681 Unsteadiness on feet: Secondary | ICD-10-CM | POA: Diagnosis not present

## 2020-12-05 DIAGNOSIS — S72002D Fracture of unspecified part of neck of left femur, subsequent encounter for closed fracture with routine healing: Secondary | ICD-10-CM | POA: Diagnosis not present

## 2020-12-05 DIAGNOSIS — M25552 Pain in left hip: Secondary | ICD-10-CM | POA: Diagnosis not present

## 2020-12-05 DIAGNOSIS — R2681 Unsteadiness on feet: Secondary | ICD-10-CM | POA: Diagnosis not present

## 2020-12-05 DIAGNOSIS — M810 Age-related osteoporosis without current pathological fracture: Secondary | ICD-10-CM | POA: Diagnosis not present

## 2020-12-05 DIAGNOSIS — M6281 Muscle weakness (generalized): Secondary | ICD-10-CM | POA: Diagnosis not present

## 2020-12-05 DIAGNOSIS — Z471 Aftercare following joint replacement surgery: Secondary | ICD-10-CM | POA: Diagnosis not present

## 2020-12-06 DIAGNOSIS — Z471 Aftercare following joint replacement surgery: Secondary | ICD-10-CM | POA: Diagnosis not present

## 2020-12-06 DIAGNOSIS — M25552 Pain in left hip: Secondary | ICD-10-CM | POA: Diagnosis not present

## 2020-12-06 DIAGNOSIS — M810 Age-related osteoporosis without current pathological fracture: Secondary | ICD-10-CM | POA: Diagnosis not present

## 2020-12-06 DIAGNOSIS — S72002D Fracture of unspecified part of neck of left femur, subsequent encounter for closed fracture with routine healing: Secondary | ICD-10-CM | POA: Diagnosis not present

## 2020-12-06 DIAGNOSIS — R2681 Unsteadiness on feet: Secondary | ICD-10-CM | POA: Diagnosis not present

## 2020-12-06 DIAGNOSIS — M6281 Muscle weakness (generalized): Secondary | ICD-10-CM | POA: Diagnosis not present

## 2020-12-07 DIAGNOSIS — M6281 Muscle weakness (generalized): Secondary | ICD-10-CM | POA: Diagnosis not present

## 2020-12-07 DIAGNOSIS — M25552 Pain in left hip: Secondary | ICD-10-CM | POA: Diagnosis not present

## 2020-12-07 DIAGNOSIS — S72002D Fracture of unspecified part of neck of left femur, subsequent encounter for closed fracture with routine healing: Secondary | ICD-10-CM | POA: Diagnosis not present

## 2020-12-07 DIAGNOSIS — M810 Age-related osteoporosis without current pathological fracture: Secondary | ICD-10-CM | POA: Diagnosis not present

## 2020-12-07 DIAGNOSIS — Z471 Aftercare following joint replacement surgery: Secondary | ICD-10-CM | POA: Diagnosis not present

## 2020-12-07 DIAGNOSIS — R2681 Unsteadiness on feet: Secondary | ICD-10-CM | POA: Diagnosis not present

## 2020-12-08 DIAGNOSIS — R2681 Unsteadiness on feet: Secondary | ICD-10-CM | POA: Diagnosis not present

## 2020-12-08 DIAGNOSIS — S72002D Fracture of unspecified part of neck of left femur, subsequent encounter for closed fracture with routine healing: Secondary | ICD-10-CM | POA: Diagnosis not present

## 2020-12-08 DIAGNOSIS — M810 Age-related osteoporosis without current pathological fracture: Secondary | ICD-10-CM | POA: Diagnosis not present

## 2020-12-08 DIAGNOSIS — M6281 Muscle weakness (generalized): Secondary | ICD-10-CM | POA: Diagnosis not present

## 2020-12-08 DIAGNOSIS — M25552 Pain in left hip: Secondary | ICD-10-CM | POA: Diagnosis not present

## 2020-12-08 DIAGNOSIS — Z471 Aftercare following joint replacement surgery: Secondary | ICD-10-CM | POA: Diagnosis not present

## 2020-12-11 DIAGNOSIS — Z471 Aftercare following joint replacement surgery: Secondary | ICD-10-CM | POA: Diagnosis not present

## 2020-12-11 DIAGNOSIS — M810 Age-related osteoporosis without current pathological fracture: Secondary | ICD-10-CM | POA: Diagnosis not present

## 2020-12-11 DIAGNOSIS — S72002D Fracture of unspecified part of neck of left femur, subsequent encounter for closed fracture with routine healing: Secondary | ICD-10-CM | POA: Diagnosis not present

## 2020-12-11 DIAGNOSIS — M6281 Muscle weakness (generalized): Secondary | ICD-10-CM | POA: Diagnosis not present

## 2020-12-11 DIAGNOSIS — R2681 Unsteadiness on feet: Secondary | ICD-10-CM | POA: Diagnosis not present

## 2020-12-11 DIAGNOSIS — M25552 Pain in left hip: Secondary | ICD-10-CM | POA: Diagnosis not present

## 2020-12-12 DIAGNOSIS — Z471 Aftercare following joint replacement surgery: Secondary | ICD-10-CM | POA: Diagnosis not present

## 2020-12-12 DIAGNOSIS — M810 Age-related osteoporosis without current pathological fracture: Secondary | ICD-10-CM | POA: Diagnosis not present

## 2020-12-12 DIAGNOSIS — M25552 Pain in left hip: Secondary | ICD-10-CM | POA: Diagnosis not present

## 2020-12-12 DIAGNOSIS — S72002D Fracture of unspecified part of neck of left femur, subsequent encounter for closed fracture with routine healing: Secondary | ICD-10-CM | POA: Diagnosis not present

## 2020-12-12 DIAGNOSIS — R2681 Unsteadiness on feet: Secondary | ICD-10-CM | POA: Diagnosis not present

## 2020-12-12 DIAGNOSIS — M6281 Muscle weakness (generalized): Secondary | ICD-10-CM | POA: Diagnosis not present

## 2020-12-14 DIAGNOSIS — R2681 Unsteadiness on feet: Secondary | ICD-10-CM | POA: Diagnosis not present

## 2020-12-14 DIAGNOSIS — M25552 Pain in left hip: Secondary | ICD-10-CM | POA: Diagnosis not present

## 2020-12-14 DIAGNOSIS — Z471 Aftercare following joint replacement surgery: Secondary | ICD-10-CM | POA: Diagnosis not present

## 2020-12-14 DIAGNOSIS — S72002D Fracture of unspecified part of neck of left femur, subsequent encounter for closed fracture with routine healing: Secondary | ICD-10-CM | POA: Diagnosis not present

## 2020-12-14 DIAGNOSIS — M6281 Muscle weakness (generalized): Secondary | ICD-10-CM | POA: Diagnosis not present

## 2020-12-14 DIAGNOSIS — M810 Age-related osteoporosis without current pathological fracture: Secondary | ICD-10-CM | POA: Diagnosis not present

## 2020-12-15 DIAGNOSIS — Z471 Aftercare following joint replacement surgery: Secondary | ICD-10-CM | POA: Diagnosis not present

## 2020-12-15 DIAGNOSIS — M6281 Muscle weakness (generalized): Secondary | ICD-10-CM | POA: Diagnosis not present

## 2020-12-15 DIAGNOSIS — M810 Age-related osteoporosis without current pathological fracture: Secondary | ICD-10-CM | POA: Diagnosis not present

## 2020-12-15 DIAGNOSIS — S72002D Fracture of unspecified part of neck of left femur, subsequent encounter for closed fracture with routine healing: Secondary | ICD-10-CM | POA: Diagnosis not present

## 2020-12-15 DIAGNOSIS — M25552 Pain in left hip: Secondary | ICD-10-CM | POA: Diagnosis not present

## 2020-12-15 DIAGNOSIS — R2681 Unsteadiness on feet: Secondary | ICD-10-CM | POA: Diagnosis not present

## 2021-01-11 ENCOUNTER — Non-Acute Institutional Stay: Payer: MEDICARE | Admitting: Nurse Practitioner

## 2021-01-11 DIAGNOSIS — M8949 Other hypertrophic osteoarthropathy, multiple sites: Secondary | ICD-10-CM

## 2021-01-11 DIAGNOSIS — R350 Frequency of micturition: Secondary | ICD-10-CM | POA: Diagnosis not present

## 2021-01-11 DIAGNOSIS — D649 Anemia, unspecified: Secondary | ICD-10-CM | POA: Diagnosis not present

## 2021-01-11 DIAGNOSIS — F32A Depression, unspecified: Secondary | ICD-10-CM

## 2021-01-11 DIAGNOSIS — M159 Polyosteoarthritis, unspecified: Secondary | ICD-10-CM | POA: Insufficient documentation

## 2021-01-11 DIAGNOSIS — I872 Venous insufficiency (chronic) (peripheral): Secondary | ICD-10-CM | POA: Diagnosis not present

## 2021-01-11 DIAGNOSIS — K5901 Slow transit constipation: Secondary | ICD-10-CM | POA: Diagnosis not present

## 2021-01-11 DIAGNOSIS — I10 Essential (primary) hypertension: Secondary | ICD-10-CM | POA: Diagnosis not present

## 2021-01-11 DIAGNOSIS — F419 Anxiety disorder, unspecified: Secondary | ICD-10-CM

## 2021-01-11 DIAGNOSIS — K219 Gastro-esophageal reflux disease without esophagitis: Secondary | ICD-10-CM

## 2021-01-11 NOTE — Assessment & Plan Note (Signed)
Depression/anxiety, lifelong issue, but sleeps/eats at her baseline.   

## 2021-01-11 NOTE — Assessment & Plan Note (Signed)
6x/last night, overall nocturnal urinary frequency has trended up from 1-2x 5o 5-6x per night, may consider Myrbetriq 30m qd, update CBC/diff, CMP/eGFR, UA C/S

## 2021-01-11 NOTE — Assessment & Plan Note (Signed)
Anemia, Hgb 10.5 11/07/20  takes Fe.

## 2021-01-11 NOTE — Assessment & Plan Note (Signed)
S/p left bipolar hemiarthroplasty 10/12/20 for left hip fracture. Takes Tylenol, Oxycodone, Methocarbamol for pain. Walking with therapy. 10/24/20 Ortho WBAT 

## 2021-01-11 NOTE — Assessment & Plan Note (Signed)
Constipation, takes MiraLax qd.  

## 2021-01-11 NOTE — Assessment & Plan Note (Signed)
GERD, stable, on Omeprazole  

## 2021-01-11 NOTE — Assessment & Plan Note (Signed)
HTN, blood pressure is controlled, on amlodipine 5mg  qd, Metoprolol 37.5mg  qd. Bun/creat 12/0.55 11/07/20

## 2021-01-11 NOTE — Progress Notes (Signed)
Location:   [place] Room Number: 741 Place of Service:  ALF (13) Provider: Lennie Odor Oscar Forman NP  [name], MD  Patient Care Team: [name], MD as PCP - General (Internal Medicine) [name], MD as Consulting Physician (Orthopedic Surgery)  Extended Emergency Contact Information Primary Emergency Contact: [name] Address: [address]          [place] [address] [name] of [place] Phone: [phone] Relation: Relative Secondary Emergency Contact: Rocky Crafts Address: [address]          [place], [place] [address] [name] of [place] Phone: [phone] Mobile Phone: [phone] Relation: Relative  Code Status:  DNR Goals of care: Advanced Directive information Advanced Directives 11/07/2020  Does Patient Have a Medical Advance Directive? Yes  Type of Paramedic of [place];Living will;Out of facility DNR (pink MOST or yellow form)  Does patient want to make changes to medical advance directive? No - Patient declined  Copy of [place] in Chart? Yes - validated most recent copy scanned in chart (See row information)  Would patient like information on creating a medical advance directive? -  Pre-existing out of facility DNR order (yellow form or pink MOST form) Pink MOST form placed in chart (order not valid for inpatient use);Yellow form placed in chart (order not valid for inpatient use)     Chief Complaint  Patient presents with  . Medical Management of Chronic Issues    HPI:  Pt is a 87 y.o. female seen today for medical management of chronic diseases.     Anemia, Hgb 10.5 11/07/20  takes Fe.              S/p left bipolar hemiarthroplasty 10/12/20 for left hip fracture. Takes Tylenol, Oxycodone, Methocarbamol for pain. Walking with therapy. 10/24/20 Ortho WBAT HTN, blood pressure is controlled, on amlodipine 32m qd, Metoprolol 37.563m qd. Bun/creat 12/0.55 11/07/20 CHF/PVD, peripheral edema, on Furosemide 4065md. Cardiology 11/06/20 GERD, stable, on Omeprazole.             Depression/anxiety, lifelong issue, but sleeps/eats at her baseline.              Constipation, takes MiraLax qd Past Medical History:  Diagnosis Date  . Aortic heart murmur    AS/AI on 2D ECHO ; SBE prophylaxis Rxed  . Chest pain 2007 ; 2011   negative cardiac assessment  . GERD (gastroesophageal reflux disease)   . Hyperlipidemia   . Hypertension [  . Interstitial cystitis    Resolved  . Ocular migraine 04/10/2012  . Osteoporosis    T score - 3.0 @ femoral neck; Fosamax affected swallowing  . [place]ipheral vascular disease [place] . Postmenopausal    No PMH or HRT   Past Surgical History:  Procedure Laterality Date  . ANTERIOR APPROACH HEMI HIP ARTHROPLASTY Right 09/03/2019   Procedure: ANTERIOR APPROACH HEMI HIP ARTHROPLASTY BIPOLAR;  Surgeon: GraDorna LeitzD;  Location: [facility];  Service: Orthopedics;  Laterality: Right;  . COLONOSCOPY  2011   Dr PatSharlett Iles HIP ARTHROPLASTY Left 10/11/2020   Procedure: ARTHROPLASTY BIPOLAR HIP (HEMIARTHROPLASTY) ANTERIOR APPROACH;  Surgeon: GraDorna LeitzD;  Location: [facility];  Service: Orthopedics;  Laterality: Left;    Allergies  Allergen Reactions  . Amitid [Amitriptyline Hcl]     Patient experienced a dizzy, spinning feeling   . Metronidazole  REACTION: throat swells  . Penicillins     REACTION: rash & fever  . Alendronate Sodium     REACTION: unspecified  . Amoxicillin Nausea And Vomiting  . Clarithromycin     GI intolerance  . Flonase [Fluticasone Propionate]     Slight Headache   . Promethazine Hcl     REACTION: unspecified  . Robitussin (Alcohol Free) [Guaifenesin] Other (See Comments)    unknown hyperactive  . Spironolactone     09/21/13 weakness in legs  . Singulair [Montelukast Sodium]     "spacey"; ineffective    Allergies as of 01/11/2021       Reactions   Amitid [amitriptyline Hcl]    Patient experienced a dizzy, spinning feeling    Metronidazole    REACTION: throat swells   Penicillins    REACTION: rash & fever   Alendronate Sodium    REACTION: unspecified   Amoxicillin Nausea And Vomiting   Clarithromycin    GI intolerance   Flonase [fluticasone Propionate]    Slight Headache    Promethazine Hcl    REACTION: unspecified   Robitussin (alcohol Free) [guaifenesin] Other (See Comments)   unknown hyperactive   Spironolactone    09/21/13 weakness in legs   Singulair [montelukast Sodium]    "spacey"; ineffective      Medication List       Accurate as of January 11, 2021 11:59 PM. If you have any questions, ask your nurse or doctor.        acetaminophen 500 MG tablet Commonly known as: TYLENOL Take 1,000 mg by mouth every 6 (six) hours as needed for moderate pain.   acetaminophen 325 MG tablet Commonly known as: TYLENOL Take 650 mg by mouth in the morning and at bedtime. Not to exceed 3000 mg per 24 hours.   amLODipine 5 MG tablet Commonly known as: NORVASC TAKE 1 TABLET BY MOUTH EVERY DAY IN THE EVENING What changed:   how much to take  how to take this  when to take this   aspirin EC 81 MG tablet Take 2 tablets (162 mg total) by mouth 2 (two) times daily after a meal.   Biofreeze 4 % Gel Generic drug: Menthol (Topical Analgesic) Apply 1 application topically 2 (two) times daily as needed (left knee pain).   docusate sodium 100 MG capsule Commonly known as: COLACE Take 1 capsule (100 mg total) by mouth 2 (two) times daily as needed (for constipation).   ferrous sulfate 325 (65 FE) MG tablet Take 325 mg by mouth daily with breakfast. Once A Day on Mon, Wed, Fri   fexofenadine 180 MG tablet Commonly known as: ALLEGRA Take 180 mg by mouth daily as needed for allergies or rhinitis.   furosemide 40 MG tablet Commonly known as: LASIX Take 1 tablet (40 mg total) by mouth daily.    HYDROcodone-acetaminophen 5-325 MG tablet Commonly known as: NORCO/VICODIN Take 1 tablet by mouth every 6 (six) hours as needed for moderate pain. Start date : 10/09/20   lactose free nutrition Liqd Take 237 mLs by mouth daily.   metoprolol succinate 25 MG 24 hr tablet Commonly known as: TOPROL-XL TAKE 1.5 TABLETS (37.5 MG TOTAL) BY MOUTH DAILY.   multivitamin tablet Take 1 tablet by mouth daily. chewable   omeprazole 20 MG capsule Commonly known as: PRILOSEC Take 20 mg by mouth daily.   oxycodone 5 MG capsule Commonly known as: OXY-IR Take 5 mg by mouth in the morning and at bedtime.   polyethylene  glycol 17 g packet Commonly known as: MIRALAX / GLYCOLAX Take 17 g by mouth daily.   potassium chloride SA 20 MEQ tablet Commonly known as: KLOR-CON Take 20 mEq by mouth daily.   ROBAXIN PO Take 250 mg by mouth every 8 (eight) hours as needed.       Review of Systems  Constitutional: Negative for appetite change, fatigue and fever.  HENT: Positive for hearing loss. Negative for congestion and trouble swallowing.   Eyes: Negative for visual disturbance.  Respiratory: Negative for cough, shortness of breath and wheezing.   Cardiovascular: Positive for leg swelling.  Gastrointestinal: Negative for abdominal pain and constipation.       GERD  Genitourinary: Negative for difficulty urinating, dysuria, hematuria and urgency.       Increased nocturnal urinary frequency, 6x/last night. .   Musculoskeletal: Positive for arthralgias and gait problem.       R hip pain is chronic, surgery about a year ago. Left hip pain, s/p hemiarthroplasty, WBAT  Skin: Positive for pallor. Negative for color change.  Neurological: Negative for speech difficulty, weakness and light-headedness.  Psychiatric/Behavioral: Negative for behavioral problems and sleep disturbance. The patient is not nervous/anxious.     Immunization History  Administered Date(s) Administered  . Fluad Quad(high Dose  65+) 10/05/2019  . Influenza Split 12/10/2011, 09/29/2012  . Influenza, High Dose Seasonal PF 09/21/2014, 08/27/2016, 08/27/2017, 09/23/2018  . Influenza,inj,Quad PF,6+ Mos 10/08/2013, 09/12/2015  . Influenza-Unspecified 10/27/2020  . PFIZER(Purple Top)SARS-COV-2 Vaccination 02/28/2020, 03/29/2020  . Pneumococcal Conjugate-13 08/22/2015  . Pneumococcal Polysaccharide-23 11/23/2008  . Td 03/23/2004  . Tdap 07/12/2015   Pertinent  Health Maintenance Due  Topic Date Due  . INFLUENZA VACCINE  Completed  . DEXA SCAN  Completed  . PNA vac Low Risk Adult  Completed   Fall Risk  09/23/2018 09/19/2017 09/04/2016 08/20/2016 12/20/2014  Falls in the past year? No No No No No  Comment - - [place] Telephone Survey: data to providers prior to load - -   Functional Status Survey:    Vitals:   01/11/21 1425  BP: (!) 146/64  Pulse: 80  Resp: 18  Temp: (!) 97.5 F (36.4 C)  SpO2: 96%   There is no height or weight on file to calculate BMI. Physical Exam Vitals and nursing note reviewed.  Constitutional:      Appearance: Normal appearance.  HENT:     Head: Normocephalic and atraumatic.     Nose: Nose normal.     Mouth/Throat:     Mouth: Mucous membranes are moist.  Eyes:     Extraocular Movements: Extraocular movements intact.     Conjunctiva/sclera: Conjunctivae normal.     Pupils: Pupils are equal, round, and reactive to light.  Cardiovascular:     Rate and Rhythm: Normal rate and regular rhythm.     Heart sounds: No murmur heard.   Pulmonary:     Effort: Pulmonary effort is normal.     Breath sounds: No rales.  Abdominal:     General: Bowel sounds are normal.     Palpations: Abdomen is soft.     Tenderness: There is no abdominal tenderness. There is no right CVA tenderness, left CVA tenderness or guarding.  Musculoskeletal:     Cervical back: Normal range of motion and neck supple.     Right lower leg: Edema present.     Left lower leg: Edema present.     Comments: Minimal   edema BLE. Left hip-s/p hemiarthroplasty is healed, feels  sore sometimes.   Skin:    General: Skin is warm and dry.  Neurological:     General: No focal deficit present.     Mental Status: She is alert and oriented to person, place, and time. Mental status is at baseline.     Motor: No weakness.     Coordination: Coordination normal.     Gait: Gait abnormal.  Psychiatric:        Mood and Affect: Mood normal.        Behavior: Behavior normal.        Thought Content: Thought content normal.        Judgment: Judgment normal.     Comments: Admitted feeling depressed or sad at times, but declined antidepressant, stated she sleeps well, has supportive family, is happy at [facility].      Labs reviewed: Recent Labs    10/11/20 0340 10/12/20 0020 10/12/20 0656 10/25/20 0000  NA 135 131* 133* 140  K 3.3* 4.0 3.8 3.8  CL 99 98 98 101  CO2 27 27 27  32*  GLUCOSE 103* 197* 148*  --   BUN 19 16 12 7   CREATININE 0.47 0.44 0.44 0.5  CALCIUM 9.0 8.8* 8.9 8.9   Recent Labs    09/01/20 0000 10/10/20 1806 10/25/20 0000  AST 11* 14* 10*  ALT 11 16 10   ALKPHOS 135* 114 108  BILITOT  --  0.6  --   PROT  --  6.5  --   ALBUMIN 3.9 4.0 2.7*   Recent Labs    05/11/20 0000 09/01/20 0000 09/01/20 0000 10/10/20 1806 10/11/20 0340 10/12/20 0656 10/13/20 0309 10/25/20 0000 10/31/20 0000  WBC 5.7 6.8   < > 8.9 7.4 12.1* 11.1* 5.8 4.4  NEUTROABS 3,523 4,481  --  7.4  --   --   --   --   --   HGB 12.2 12.3  --  13.3 11.5* 10.8* 10.5* 8.9* 8.9*  HCT 36 37  --  40.9 34.9* 32.7* 32.5* 27* 26*  MCV  --   --    < > 94.9 92.6 94.2 93.9  --   --   PLT 239 322  --  353 284 287 273 375  --    < > = values in this interval not displayed.   Lab Results  Component Value Date   TSH 1.90 05/11/2020   Lab Results  Component Value Date   HGBA1C 5.5 07/12/2015   Lab Results  Component Value Date   CHOL 227 (H) 06/24/2013   HDL 89.10 06/24/2013   LDLDIRECT 114.7 06/24/2013   TRIG 80.0  06/24/2013   CHOLHDL 3 06/24/2013    Significant Diagnostic Results in last 30 days:  No results found.  Assessment/Plan  Urinary frequency 6x/last night, overall nocturnal urinary frequency has trended up from 1-2x 5o 5-6x per night, may consider Myrbetriq 64m qd, update CBC/diff, CMP/eGFR, UA C/S  ANEMIA, MILD Anemia, Hgb 10.5 11/07/20  takes Fe.   Osteoarthritis, multiple sites S/p left bipolar hemiarthroplasty 10/12/20 for left hip fracture. Takes Tylenol, Oxycodone, Methocarbamol for pain. Walking with therapy. 10/24/20 Ortho WBAT   Essential hypertension HTN, blood pressure is controlled, on amlodipine 54mqd, Metoprolol 37.67m70md. Bun/creat 12/0.55 11/07/20   Venous (peripheral) insufficiency CHF/PVD, peripheral edema, on Furosemide 3m62m. Cardiology 11/06/20  Esophageal reflux GERD, stable, on Omeprazole.  Anxiety and depression Depression/anxiety, lifelong issue, but sleeps/eats at her baseline.   Slow transit constipation Constipation, takes MiraLax qd  Family/ staff Communication: plan of care reviewed with the patient and charge nurse.   Labs/tests ordered: CBC/diff, CMP/eGFR, UA C/S   Time spend 40 minutes.

## 2021-01-11 NOTE — Assessment & Plan Note (Signed)
CHF/PVD, peripheral edema, on Furosemide 40mg  qd. Cardiology 11/06/20

## 2021-01-12 DIAGNOSIS — N760 Acute vaginitis: Secondary | ICD-10-CM | POA: Diagnosis not present

## 2021-01-12 DIAGNOSIS — I10 Essential (primary) hypertension: Secondary | ICD-10-CM | POA: Diagnosis not present

## 2021-01-12 DIAGNOSIS — R509 Fever, unspecified: Secondary | ICD-10-CM | POA: Diagnosis not present

## 2021-01-12 DIAGNOSIS — R350 Frequency of micturition: Secondary | ICD-10-CM | POA: Diagnosis not present

## 2021-01-12 LAB — CBC AND DIFFERENTIAL
HCT: 34 — AB (ref 36–46)
Hemoglobin: 11.2 — AB (ref 12.0–16.0)
Neutrophils Absolute: 3265
Platelets: 237 (ref 150–399)
WBC: 5.8

## 2021-01-12 LAB — HEPATIC FUNCTION PANEL
ALT: 11 (ref 7–35)
AST: 11 — AB (ref 13–35)
Alkaline Phosphatase: 103 (ref 25–125)
Bilirubin, Total: 0.3

## 2021-01-12 LAB — COMPREHENSIVE METABOLIC PANEL
Albumin: 3.6 (ref 3.5–5.0)
Calcium: 9.1 (ref 8.7–10.7)
GFR calc Af Amer: 109136
GFR calc non Af Amer: 94
Globulin: 1.4

## 2021-01-12 LAB — BASIC METABOLIC PANEL
BUN: 10 (ref 4–21)
CO2: 26 — AB (ref 13–22)
Chloride: 104 (ref 99–108)
Creatinine: 0.4 — AB (ref 0.5–1.1)
Glucose: 91
Potassium: 4.2 (ref 3.4–5.3)
Sodium: 136 — AB (ref 137–147)

## 2021-01-12 LAB — CBC: RBC: 3.69 — AB (ref 3.87–5.11)

## 2021-01-17 ENCOUNTER — Encounter: Payer: Self-pay | Admitting: Nurse Practitioner

## 2021-01-23 DIAGNOSIS — M25551 Pain in right hip: Secondary | ICD-10-CM | POA: Diagnosis not present

## 2021-01-23 DIAGNOSIS — M5441 Lumbago with sciatica, right side: Secondary | ICD-10-CM | POA: Diagnosis not present

## 2021-01-23 DIAGNOSIS — M5442 Lumbago with sciatica, left side: Secondary | ICD-10-CM | POA: Diagnosis not present

## 2021-01-23 DIAGNOSIS — M545 Low back pain, unspecified: Secondary | ICD-10-CM | POA: Diagnosis not present

## 2021-01-23 DIAGNOSIS — M25552 Pain in left hip: Secondary | ICD-10-CM | POA: Diagnosis not present

## 2021-04-08 ENCOUNTER — Emergency Department (HOSPITAL_BASED_OUTPATIENT_CLINIC_OR_DEPARTMENT_OTHER)
Admission: EM | Admit: 2021-04-08 | Discharge: 2021-04-08 | Disposition: A | Discharge status: Home / Self Care | Payer: MEDICARE | Attending: Emergency Medicine | Admitting: Emergency Medicine

## 2021-04-08 ENCOUNTER — Encounter (HOSPITAL_BASED_OUTPATIENT_CLINIC_OR_DEPARTMENT_OTHER): Payer: Self-pay

## 2021-04-08 ENCOUNTER — Emergency Department (HOSPITAL_BASED_OUTPATIENT_CLINIC_OR_DEPARTMENT_OTHER): Payer: MEDICARE

## 2021-04-08 DIAGNOSIS — I10 Essential (primary) hypertension: Secondary | ICD-10-CM | POA: Diagnosis not present

## 2021-04-08 DIAGNOSIS — S0003XA Contusion of scalp, initial encounter: Secondary | ICD-10-CM | POA: Insufficient documentation

## 2021-04-08 DIAGNOSIS — W19XXXA Unspecified fall, initial encounter: Secondary | ICD-10-CM | POA: Diagnosis not present

## 2021-04-08 DIAGNOSIS — Y9301 Activity, walking, marching and hiking: Secondary | ICD-10-CM | POA: Insufficient documentation

## 2021-04-08 DIAGNOSIS — S0990XA Unspecified injury of head, initial encounter: Secondary | ICD-10-CM | POA: Diagnosis present

## 2021-04-08 DIAGNOSIS — Z96642 Presence of left artificial hip joint: Secondary | ICD-10-CM | POA: Insufficient documentation

## 2021-04-08 DIAGNOSIS — Z79899 Other long term (current) drug therapy: Secondary | ICD-10-CM | POA: Insufficient documentation

## 2021-04-08 DIAGNOSIS — Y9389 Activity, other specified: Secondary | ICD-10-CM | POA: Diagnosis not present

## 2021-04-08 DIAGNOSIS — Y92013 Bedroom of single-family (private) house as the place of occurrence of the external cause: Secondary | ICD-10-CM | POA: Diagnosis not present

## 2021-04-08 DIAGNOSIS — W010XXA Fall on same level from slipping, tripping and stumbling without subsequent striking against object, initial encounter: Secondary | ICD-10-CM | POA: Insufficient documentation

## 2021-04-08 DIAGNOSIS — Z7982 Long term (current) use of aspirin: Secondary | ICD-10-CM | POA: Diagnosis not present

## 2021-04-08 NOTE — ED Triage Notes (Signed)
Pt. Reports falling upon getting out of her shower. She tells [place] she fells backward. Her only c/o pain is at occiput, at which she has an area of edema. She is awake, alert and oriented x 4 with clear speech. She denies l.o.c. and is in no distress.

## 2021-04-08 NOTE — ED Provider Notes (Signed)
[facility] EMERGENCY DEPT Provider Note   CSN: [id number] Arrival date & time: 04/08/21  1435     History Chief Complaint  Patient presents with  . Fall    Alicia Boyd is a 87 y.o. female.   Fall This is a new problem. The current episode started 1 to 2 hours ago. The problem occurs constantly. The problem has not changed since onset.Associated symptoms include headaches (mild). Pertinent negatives include no chest pain and no shortness of breath. Nothing aggravates the symptoms. Nothing relieves the symptoms. She has tried nothing for the symptoms. The treatment provided no relief.       Past Medical History:  Diagnosis Date  . Aortic heart murmur    AS/AI on 2D ECHO ; SBE prophylaxis Rxed  . Chest pain 2007 ; 2011   negative cardiac assessment  . GERD (gastroesophageal reflux disease)   . Hyperlipidemia   . Hypertension [  . Interstitial cystitis    Resolved  . Ocular migraine 04/10/2012  . Osteoporosis    T score - 3.0 @ femoral neck; Fosamax affected swallowing  . Peripheral vascular disease ([place])   . Postmenopausal    No PMH or HRT    Patient Active Problem List   Diagnosis Date Noted  . Urinary frequency 01/11/2021  . Osteoarthritis, multiple sites 01/11/2021  . Weight gain 11/03/2020  . s/p left hip hemiarthroplasty 10/13/2020  . S/P hip hemiarthroplasty 10/11/2020  . Displaced fracture of left femoral neck ([place]) 10/10/2020  . Left displaced femoral neck fracture ([place]) 10/10/2020  . DNR (do not resuscitate) discussion 10/10/2020  . Nondisplaced fracture of body of right calcaneus, initial encounter for closed fracture 08/30/2020  . Left knee pain 06/27/2020  . Contact dermatitis 06/19/2020  . Left hip pain 05/10/2020  . Unsteady gait 05/10/2020  . Bilateral pleural effusion 10/03/2019  . Hypokalemia 10/03/2019  . Malnutrition of moderate degree 09/08/2019  . Displaced fracture of right femoral neck ([place]) 09/03/2019  . Failure to  thrive in adult 09/01/2019  . Quadriceps strain, right, initial encounter 09/01/2019  . Anxiety and depression 07/05/2019  . Greater trochanteric bursitis of left hip 06/22/2019  . Closed fracture of multiple pubic rami, right, initial encounter ([place]) 05/28/2019  . Greater trochanteric bursitis of right hip 01/05/2019  . Hypertensive retinopathy, bilateral 04/29/2018  . Nuclear sclerosis of both eyes 04/29/2018  . Degenerative arthritis of left knee 04/16/2018  . Mild carpal tunnel syndrome of right wrist 09/24/2017  . Ecchymoses, spontaneous 07/29/2016  . Varicose veins 07/29/2016  . Nasal congestion 07/29/2016  . Hyperglycemia 07/12/2015  . Retinal hemorrhage, right 06/06/2014  . Paresthesia of hand 03/22/2014  . Undiagnosed cardiac murmurs 03/22/2014  . Cataract 04/10/2012  . Ocular migraine 04/10/2012  . Palpitations 03/03/2012  . ANEMIA, MILD 07/27/2010  . Venous (peripheral) insufficiency 07/27/2010  . Slow transit constipation 04/17/2010  . Essential hypertension 04/06/2010  . RHINOCONJUNCTIVITIS, ALLERGIC 04/04/2010  . PERSONAL HISTORY OTHER DISORDER URINARY SYSTEM 01/25/2010  . OTHER DYSPHAGIA 04/11/2009  . Esophageal reflux 01/24/2009  . OTHER AND UNSPECIFIED HYPERLIPIDEMIA 11/23/2008  . Osteoporosis 11/23/2008  . NONSPECIFIC ABNORMAL ELECTROCARDIOGRAM 11/23/2008    Past Surgical History:  Procedure Laterality Date  . ANTERIOR APPROACH HEMI HIP ARTHROPLASTY Right 09/03/2019   Procedure: ANTERIOR APPROACH HEMI HIP ARTHROPLASTY BIPOLAR;  Surgeon: [name], MD;  Location: [facility];  Service: Orthopedics;  Laterality: Right;  . COLONOSCOPY  2011   Dr Sharlett Iles  . HIP ARTHROPLASTY Left 10/11/2020   Procedure: ARTHROPLASTY BIPOLAR  HIP (HEMIARTHROPLASTY) ANTERIOR APPROACH;  Surgeon: [name], MD;  Location: [facility];  Service: Orthopedics;  Laterality: Left;     OB History   No obstetric history on file.     Family History  Problem Relation Age of Onset  .  Diabetes Father        Pancreatitic insufficiency post MVA  . Hypertension Mother   . Osteoporosis Mother   . Heart disease Mother        AF  . Thyroid disease Sister        hypothyroidism  . Coronary artery disease Sister        AF  . Stroke Sister 64       also pulmonary disease    Social History   Tobacco Use  . Smoking status: Never Smoker  . Smokeless tobacco: Never Used  Substance Use Topics  . Alcohol use: No  . Drug use: No    Home Medications Prior to Admission medications   Medication Sig Start Date End Date Taking? Authorizing Provider  acetaminophen (TYLENOL) 325 MG tablet Take 650 mg by mouth in the morning and at bedtime. Not to exceed 3000 mg per 24 hours.    [provider]  acetaminophen (TYLENOL) 500 MG tablet Take 1,000 mg by mouth every 6 (six) hours as needed for moderate pain.    [provider]  amLODipine (NORVASC) 5 MG tablet TAKE 1 TABLET BY MOUTH EVERY DAY IN THE EVENING Patient taking differently: Take 5 mg by mouth daily.  07/12/20   [name], NP  aspirin EC 81 MG tablet Take 2 tablets (162 mg total) by mouth 2 (two) times daily after a meal. 10/13/20   Dereck Leep, PA-C  docusate sodium (COLACE) 100 MG capsule Take 1 capsule (100 mg total) by mouth 2 (two) times daily as needed (for constipation). 10/13/20   Dereck Leep, PA-C  ferrous sulfate 325 (65 FE) MG tablet Take 325 mg by mouth daily with breakfast. Once A Day on Mon, Wed, Fri    [provider]  fexofenadine (ALLEGRA) 180 MG tablet Take 180 mg by mouth daily as needed for allergies or rhinitis.     [provider]  furosemide (LASIX) 40 MG tablet Take 1 tablet (40 mg total) by mouth daily. 03/28/20   Nafziger, [name], NP  HYDROcodone-acetaminophen (NORCO/VICODIN) 5-325 MG tablet Take 1 tablet by mouth every 6 (six) hours as needed for moderate pain. Start date : 10/09/20    [provider]  lactose free nutrition (BOOST PLUS) LIQD Take  237 mLs by mouth daily.    [provider]  Menthol, Topical Analgesic, (BIOFREEZE) 4 % GEL Apply 1 application topically 2 (two) times daily as needed (left knee pain).     [provider]  Methocarbamol (ROBAXIN PO) Take 250 mg by mouth every 8 (eight) hours as needed.    [provider]  metoprolol succinate (TOPROL-XL) 25 MG 24 hr tablet TAKE 1.5 TABLETS (37.5 MG TOTAL) BY MOUTH DAILY. 05/17/20   Nafziger, [name], NP  Multiple Vitamin (MULTIVITAMIN) tablet Take 1 tablet by mouth daily. chewable    [provider]  omeprazole (PRILOSEC) 20 MG capsule Take 20 mg by mouth daily.    [provider]  oxycodone (OXY-IR) 5 MG capsule Take 5 mg by mouth in the morning and at bedtime.    [provider]  polyethylene glycol (MIRALAX / GLYCOLAX) 17 g packet Take 17 g by mouth daily.  [provider]  potassium chloride SA (KLOR-CON) 20 MEQ tablet Take 20 mEq by mouth daily.    [provider]    Allergies    Amitid [amitriptyline hcl], Metronidazole, Penicillins, Alendronate sodium, Amoxicillin, Clarithromycin, Flonase [fluticasone propionate], Promethazine hcl, Robitussin (alcohol free) [guaifenesin], Spironolactone, and Singulair [montelukast sodium]  Review of Systems   Review of Systems  Constitutional: Negative for chills and fever.  HENT: Negative for congestion and rhinorrhea.   Respiratory: Negative for cough and shortness of breath.   Cardiovascular: Negative for chest pain and palpitations.  Gastrointestinal: Negative for diarrhea, nausea and vomiting.  Genitourinary: Negative for difficulty urinating and dysuria.  Musculoskeletal: Negative for arthralgias, back pain, neck pain and neck stiffness.  Skin: Positive for color change and wound. Negative for rash.  Neurological: Positive for headaches (mild). Negative for light-headedness.    Physical Exam Updated Vital Signs BP (!) 160/82 (BP Location: Right Arm)    Pulse 85   Temp 98.9 F (37.2 C)   Resp (!) 22   SpO2 97%   Physical Exam Vitals and nursing note reviewed. Exam conducted with a chaperone present.  Constitutional:      General: She is not in acute distress.    Appearance: Normal appearance.  HENT:     Head: Normocephalic.     Comments: 3 cm circular hematoma on the right occipital region mild tenderness to palpation no underlying crepitus or deformity    Nose: No rhinorrhea.  Eyes:     General:        Right eye: No discharge.        Left eye: No discharge.     Conjunctiva/sclera: Conjunctivae normal.  Cardiovascular:     Rate and Rhythm: Normal rate and regular rhythm.  Pulmonary:     Effort: Pulmonary effort is normal. No respiratory distress.     Breath sounds: No stridor.  Abdominal:     General: Abdomen is flat. There is no distension.     Palpations: Abdomen is soft.     Tenderness: There is no abdominal tenderness.  Musculoskeletal:        General: No swelling, tenderness, deformity or signs of injury.     Cervical back: Normal range of motion. No tenderness.  Skin:    General: Skin is warm and dry.  Neurological:     General: No focal deficit present.     Mental Status: She is alert. Mental status is at baseline.     Cranial Nerves: No cranial nerve deficit.     Sensory: No sensory deficit.     Motor: No weakness.  Psychiatric:        Mood and Affect: Mood normal.        Behavior: Behavior normal.     ED Results / Procedures / Treatments   Labs (all labs ordered are listed, but only abnormal results are displayed) Labs Reviewed - No data to display  EKG None  Radiology No results found.  Procedures Procedures   Medications Ordered in ED Medications - No data to display  ED Course  I have reviewed the triage vital signs and the nursing notes.  Pertinent labs & imaging results that were available during my care of the patient were reviewed by me and considered in my medical decision making  (see chart for details).    MDM Rules/Calculators/A&P  Fall from standing.  Lost footing while walking from bed to get a phone.  Hit the occiput.  No loss of conscious on baby aspirin but no blood thinners.  Normal neurologic exam.  No tenderness or injury reported or found elsewhere.  Will get CT scan of the head and then reevaluate patient.  Tylenol offered and declined by patient.  Hemodynamically stable.  Resting comfortably well-appearing.  Pt care was handed off to on coming provider at 1500.  Complete history and physical and current plan have been communicated.  Please refer to their note for the remainder of ED care and ultimate disposition.  Pt seen in conjunction with Dr. Tomi Bamberger    Final Clinical Impression(s) / ED Diagnoses Final diagnoses:  Fall, initial encounter  Hematoma of scalp, initial encounter    Rx / DC Orders ED Discharge Orders    None       Breck Coons, MD 04/08/21 1455

## 2021-04-08 NOTE — ED Provider Notes (Signed)
Patient was initially seen by Dr. Ron Parker.  Please see his note.  Patient's head head CT shows a cephalhematoma but no other worrisome findings.  Patient is feeling well and is ready for discharge    Dorie Rank, MD 04/08/21 1635

## 2021-04-08 NOTE — Discharge Instructions (Addendum)
Apply ice to the area of swelling.  Take Tylenol as needed for pain.  Return to the ER for worsening symptoms

## 2021-04-09 ENCOUNTER — Non-Acute Institutional Stay: Payer: MEDICARE | Admitting: Nurse Practitioner

## 2021-04-09 ENCOUNTER — Encounter: Payer: Self-pay | Admitting: Nurse Practitioner

## 2021-04-09 DIAGNOSIS — K5901 Slow transit constipation: Secondary | ICD-10-CM | POA: Diagnosis not present

## 2021-04-09 DIAGNOSIS — I10 Essential (primary) hypertension: Secondary | ICD-10-CM | POA: Diagnosis not present

## 2021-04-09 DIAGNOSIS — S0003XD Contusion of scalp, subsequent encounter: Secondary | ICD-10-CM | POA: Insufficient documentation

## 2021-04-09 DIAGNOSIS — F419 Anxiety disorder, unspecified: Secondary | ICD-10-CM | POA: Diagnosis not present

## 2021-04-09 DIAGNOSIS — F32A Depression, unspecified: Secondary | ICD-10-CM | POA: Diagnosis not present

## 2021-04-09 DIAGNOSIS — M25552 Pain in left hip: Secondary | ICD-10-CM | POA: Diagnosis not present

## 2021-04-09 DIAGNOSIS — R2681 Unsteadiness on feet: Secondary | ICD-10-CM

## 2021-04-09 DIAGNOSIS — I872 Venous insufficiency (chronic) (peripheral): Secondary | ICD-10-CM | POA: Diagnosis not present

## 2021-04-09 DIAGNOSIS — D649 Anemia, unspecified: Secondary | ICD-10-CM | POA: Diagnosis not present

## 2021-04-09 DIAGNOSIS — K219 Gastro-esophageal reflux disease without esophagitis: Secondary | ICD-10-CM

## 2021-04-09 NOTE — Assessment & Plan Note (Signed)
stable, on Omeprazole.  

## 2021-04-09 NOTE — Assessment & Plan Note (Signed)
Stable,  takes MiraLax qd. 

## 2021-04-09 NOTE — Assessment & Plan Note (Signed)
peripheral edema, on Furosemide 40mg  qd. Cardiology f/u

## 2021-04-09 NOTE — Assessment & Plan Note (Signed)
the right occiput hematoma sustained from a mechanical fall in her room when she went to her phone and lost balance. ED eval 04/08/21, CT head showed right occipital scalp hematoma. The patient c/o mild scalp pain, no focal neurological deficits.  Encouraged safety awareness, update CBC/diff, CMP/eGFR, observe.

## 2021-04-09 NOTE — Progress Notes (Signed)
Location:   [place] Room Number: [place] Number [phone] [id number] Place of Service:  ALF ([phone] Provider: [facility] Jaculin Rasmus NP  [name], MD  Patient Care Team: [name], MD as PCP - General (Internal Medicine) [name], MD as Consulting Physician (Orthopedic Surgery)  Extended Emergency Contact Information Primary Emergency Contact: [name] Address: [address]          [place] [address] [name] of [place] Phone: [phone] Relation: Relative Secondary Emergency Contact: Rocky Crafts Address: [address]          [place], [place] [address] [name] of [place] Phone: [phone] Mobile Phone: [phone] Relation: Relative  Code Status: DNR Goals of care: Advanced Directive information Advanced Directives 04/09/2021  Does Patient Have a Medical Advance Directive? Yes  Type of Paramedic of [place];Out of facility DNR (pink MOST or yellow form)  Does patient want to make changes to medical advance directive? No - Patient declined  Copy of [place] in Chart? Yes - validated most recent copy scanned in chart (See row information)  Would patient like information on creating a medical advance directive? -  Pre-existing out of facility DNR order (yellow form or pink MOST form) Yellow form placed in chart (order not valid for inpatient use);Pink MOST form placed in chart (order not valid for inpatient use)     Chief Complaint  Patient presents with  . Follow-up    ED follow up visit. Discuss need for COVID booster (3rd)    HPI:  Pt is a 87 y.o. female seen today for an acute visit for the right occiput hematoma sustained from a mechanical fall in her room when she went to her phone and lost balance. ED eval 04/08/21, CT head showed right occipital scalp hematoma. The patient c/o mild scalp pain, no focal neurological deficits.   Anemia, Hgb 10.5  11/07/20  takes Fe. S/p left bipolar hemiarthroplasty 10/12/20 for left hip fracture, healed.  HTN, blood pressure is controlled, on amlodipine 47m qd, Metoprolol 37.598mqd. Bun/creat 12/0.55 11/07/20 CHF/PVD, peripheral edema, on Furosemide 4043md. Cardiology f/u GERD, stable, on Omeprazole. Depression/anxiety, lifelong issue, but sleeps/eats at her baseline.  Constipation, takes MiraLax qd     Past Medical History:  Diagnosis Date  . Aortic heart murmur    AS/AI on 2D ECHO ; SBE prophylaxis Rxed  . Chest pain 2007 ; 2011   negative cardiac assessment  . GERD (gastroesophageal reflux disease)   . Hyperlipidemia   . Hypertension [  . Interstitial cystitis    Resolved  . Ocular migraine 04/10/2012  . Osteoporosis    T score - 3.0 @ femoral neck; Fosamax affected swallowing  . [place]ipheral vascular disease [place] . Postmenopausal    No PMH or HRT   Past Surgical History:  Procedure Laterality Date  . ANTERIOR APPROACH HEMI HIP ARTHROPLASTY Right 09/03/2019   Procedure: ANTERIOR APPROACH HEMI HIP ARTHROPLASTY BIPOLAR;  Surgeon: GraDorna LeitzD;  Location: [facility];  Service: Orthopedics;  Laterality: Right;  . COLONOSCOPY  2011   Dr PatSharlett Iles HIP ARTHROPLASTY Left 10/11/2020   Procedure: ARTHROPLASTY BIPOLAR HIP (HEMIARTHROPLASTY) ANTERIOR APPROACH;  Surgeon: GraDorna LeitzD;  Location: [facility];  Service: Orthopedics;  Laterality: Left;    Allergies  Allergen Reactions  . Amitid [Amitriptyline Hcl]     Patient experienced a dizzy,  spinning feeling   . Metronidazole     REACTION: throat swells  . Penicillins     REACTION: rash & fever  . Alendronate Sodium     REACTION: unspecified  . Amoxicillin Nausea And Vomiting  . Clarithromycin     GI intolerance  . Flonase [Fluticasone Propionate]     Slight Headache   . Promethazine Hcl     REACTION: unspecified  . Robitussin (Alcohol Free) [Guaifenesin]  Other (See Comments)    unknown hyperactive  . Spironolactone     09/21/13 weakness in legs  . Singulair [Montelukast Sodium]     "spacey"; ineffective    Allergies as of 04/09/2021      Reactions   Amitid [amitriptyline Hcl]    Patient experienced a dizzy, spinning feeling    Metronidazole    REACTION: throat swells   Penicillins    REACTION: rash & fever   Alendronate Sodium    REACTION: unspecified   Amoxicillin Nausea And Vomiting   Clarithromycin    GI intolerance   Flonase [fluticasone Propionate]    Slight Headache    Promethazine Hcl    REACTION: unspecified   Robitussin (alcohol Free) [guaifenesin] Other (See Comments)   unknown hyperactive   Spironolactone    09/21/13 weakness in legs   Singulair [montelukast Sodium]    "spacey"; ineffective      Medication List       Accurate as of April 09, 2021 11:59 PM. If you have any questions, ask your nurse or doctor.        STOP taking these medications   lactose free nutrition Liqd Stopped by: Messi Twedt X Romulus Hanrahan, NP   oxycodone 5 MG capsule Commonly known as: OXY-IR Stopped by: Manuelito Poage X Ancil Dewan, NP     TAKE these medications   acetaminophen 500 MG tablet Commonly known as: TYLENOL Take 1,000 mg by mouth every 6 (six) hours as needed for moderate pain.   acetaminophen 325 MG tablet Commonly known as: TYLENOL Take 650 mg by mouth in the morning and at bedtime. Not to exceed 3000 mg per 24 hours.   amLODipine 5 MG tablet Commonly known as: NORVASC TAKE 1 TABLET BY MOUTH EVERY DAY IN THE EVENING What changed:   how much to take  how to take this  when to take this   aspirin EC 81 MG tablet Take 2 tablets (162 mg total) by mouth 2 (two) times daily after a meal.   Biofreeze 4 % Gel Generic drug: Menthol (Topical Analgesic) Apply 1 application topically 2 (two) times daily as needed (left knee pain).   docusate sodium 100 MG capsule Commonly known as: COLACE Take 1 capsule (100 mg total) by mouth 2 (two)  times daily as needed (for constipation).   ferrous sulfate 325 (65 FE) MG tablet Take 325 mg by mouth daily with breakfast. Once A Day on Mon, Wed, Fri   fexofenadine 180 MG tablet Commonly known as: ALLEGRA Take 180 mg by mouth daily as needed for allergies or rhinitis.   furosemide 40 MG tablet Commonly known as: LASIX Take 1 tablet (40 mg total) by mouth daily.   HYDROcodone-acetaminophen 5-325 MG tablet Commonly known as: NORCO/VICODIN Take 1 tablet by mouth every 6 (six) hours as needed for moderate pain. Start date : 10/09/20   metoprolol succinate 25 MG 24 hr tablet Commonly known as: TOPROL-XL TAKE 1.5 TABLETS (37.5 MG TOTAL) BY MOUTH DAILY.   multivitamin tablet Take 1 tablet by mouth daily. chewable  omeprazole 20 MG capsule Commonly known as: PRILOSEC Take 20 mg by mouth daily.   polyethylene glycol 17 g packet Commonly known as: MIRALAX / GLYCOLAX Take 17 g by mouth daily.   potassium chloride SA 20 MEQ tablet Commonly known as: KLOR-CON Take 20 mEq by mouth daily.   ROBAXIN PO Take 250 mg by mouth every 8 (eight) hours as needed.       Review of Systems  Constitutional: Negative for appetite change, fatigue and fever.  HENT: Positive for hearing loss. Negative for congestion and trouble swallowing.   Eyes: Negative for visual disturbance.  Respiratory: Negative for cough, shortness of breath and wheezing.   Cardiovascular: Positive for leg swelling.  Gastrointestinal: Negative for abdominal pain and constipation.       GERD  Genitourinary: Negative for difficulty urinating, dysuria, hematuria and urgency.       Increased nocturnal urinary frequency, 6x/last night. .   Musculoskeletal: Positive for arthralgias and gait problem.       R hip pain is chronic, surgery about a year ago. Left hip pain, s/p hemiarthroplasty, WBAT  Skin: Negative for color change.       Right occiput hematoma.   Neurological: Positive for headaches. Negative for speech  difficulty, weakness and light-headedness.       Mild HA at the right occiput hematoma region.   Psychiatric/Behavioral: Negative for behavioral problems and sleep disturbance. The patient is not nervous/anxious.     Immunization History  Administered Date(s) Administered  . Fluad Quad(high Dose 65+) 10/05/2019  . Influenza Split 12/10/2011, 09/29/2012  . Influenza, High Dose Seasonal PF 09/21/2014, 08/27/2016, 08/27/2017, 09/23/2018  . Influenza,inj,Quad PF,6+ Mos 10/08/2013, 09/12/2015  . Influenza-Unspecified 10/27/2020  . PFIZER(Purple Top)SARS-COV-2 Vaccination 02/28/2020, 03/29/2020  . Pneumococcal Conjugate-13 08/22/2015  . Pneumococcal Polysaccharide-23 11/23/2008  . Td 03/23/2004  . Tdap 07/12/2015   Pertinent  Health Maintenance Due  Topic Date Due  . INFLUENZA VACCINE  07/30/2021  . DEXA SCAN  Completed  . PNA vac Low Risk Adult  Completed   Fall Risk  09/23/2018 09/19/2017 09/04/2016 08/20/2016 12/20/2014  Falls in the past year? No No No No No  Comment - - Emmi Telephone Survey: data to providers prior to load - -   Functional Status Survey:    Vitals:   04/09/21 1433  BP: 120/85  Pulse: 71  Resp: 18  Temp: 98.1 F (36.7 C)  SpO2: 96%  Weight: 125 lb 9.6 oz (57 kg)  Height: 5' 4"  (1.626 m)   Body mass index is 21.56 kg/m. Physical Exam Vitals and nursing note reviewed.  Constitutional:      Appearance: Normal appearance.  HENT:     Head: Normocephalic and atraumatic.     Nose: Nose normal.     Mouth/Throat:     Mouth: Mucous membranes are moist.  Eyes:     Extraocular Movements: Extraocular movements intact.     Conjunctiva/sclera: Conjunctivae normal.     Pupils: Pupils are equal, round, and reactive to light.  Cardiovascular:     Rate and Rhythm: Normal rate and regular rhythm.     Heart sounds: No murmur heard.   Pulmonary:     Effort: Pulmonary effort is normal.     Breath sounds: No rales.  Abdominal:     General: Bowel sounds are normal.      Palpations: Abdomen is soft.     Tenderness: There is no abdominal tenderness.  Musculoskeletal:     Cervical back: Normal range  of motion and neck supple.     Right lower leg: Edema present.     Left lower leg: Edema present.     Comments: Minimal  edema BLE. Left hip-s/p hemiarthroplasty is healed, feels sore sometimes  Skin:    General: Skin is warm and dry.     Findings: Bruising present.     Comments: About a quarter sized hematoma right occiput.   Neurological:     General: No focal deficit present.     Mental Status: She is alert and oriented to person, place, and time. Mental status is at baseline.     Motor: No weakness.     Coordination: Coordination normal.     Gait: Gait abnormal.  Psychiatric:        Mood and Affect: Mood normal.        Behavior: Behavior normal.        Thought Content: Thought content normal.        Judgment: Judgment normal.     Comments: Admitted feeling depressed or sad at times, but declined antidepressant, stated she sleeps well, has supportive family, is happy at [facility].      Labs reviewed: Recent Labs    10/11/20 0340 10/12/20 0020 10/12/20 0656 10/25/20 0000 01/12/21 0000  NA 135 131* 133* 140 136*  K 3.3* 4.0 3.8 3.8 4.2  CL 99 98 98 101 104  CO2 27 27 27  32* 26*  GLUCOSE 103* 197* 148*  --   --   BUN 19 16 12 7 10   CREATININE 0.47 0.44 0.44 0.5 0.4*  CALCIUM 9.0 8.8* 8.9 8.9 9.1   Recent Labs    10/10/20 1806 10/25/20 0000 01/12/21 0000  AST 14* 10* 11*  ALT 16 10 11   ALKPHOS 114 108 103  BILITOT 0.6  --   --   PROT 6.5  --   --   ALBUMIN 4.0 2.7* 3.6   Recent Labs    09/01/20 0000 09/01/20 0000 10/10/20 1806 10/11/20 0340 10/12/20 0656 10/13/20 0309 10/25/20 0000 10/31/20 0000 01/12/21 0000  WBC 6.8   < > 8.9 7.4 12.1* 11.1* 5.8 4.4 5.8  NEUTROABS 4,481  --  7.4  --   --   --   --   --  3,265.00  HGB 12.3  --  13.3 11.5* 10.8* 10.5* 8.9* 8.9* 11.2*  HCT 37  --  40.9 34.9* 32.7* 32.5* 27* 26* 34*   MCV  --    < > 94.9 92.6 94.2 93.9  --   --   --   PLT 322  --  353 284 287 273 375  --  237   < > = values in this interval not displayed.   Lab Results  Component Value Date   TSH 1.90 05/11/2020   Lab Results  Component Value Date   HGBA1C 5.5 07/12/2015   Lab Results  Component Value Date   CHOL 227 (H) 06/24/2013   HDL 89.10 06/24/2013   LDLDIRECT 114.7 06/24/2013   TRIG 80.0 06/24/2013   CHOLHDL 3 06/24/2013    Significant Diagnostic Results in last 30 days:  CT Head Wo Contrast  Result Date: 04/08/2021 CLINICAL DATA:  Head trauma, moderate to severe. EXAM: CT HEAD WITHOUT CONTRAST TECHNIQUE: Contiguous axial images were obtained from the base of the skull through the vertex without intravenous contrast. COMPARISON:  Maxillofacial CT April 27, 2013 FINDINGS: Brain: No evidence of acute infarction, hemorrhage, hydrocephalus, extra-axial collection or mass lesion/mass effect. Moderate  brain parenchymal volume loss and deep white matter microangiopathy. Vascular: No hyperdense vessel or unexpected calcification. Skull: Normal. Negative for fracture or focal lesion. Sinuses/Orbits: No acute finding. Other: Large right occipital scalp hematoma. IMPRESSION: 1. No acute intracranial abnormality. 2. Moderate brain parenchymal atrophy and chronic microvascular disease. 3. Large right occipital scalp hematoma. Electronically Signed   By: Fidela Salisbury M.D.   On: 04/08/2021 16:07    Assessment/Plan: Scalp hematoma, subsequent encounter the right occiput hematoma sustained from a mechanical fall in her room when she went to her phone and lost balance. ED eval 04/08/21, CT head showed right occipital scalp hematoma. The patient c/o mild scalp pain, no focal neurological deficits.  Encouraged safety awareness, update CBC/diff, CMP/eGFR, observe.   Unsteady gait Safety awareness needed  ANEMIA, MILD Hgb 10.5 11/07/20  takes Fe.  Left hip pain S/p left bipolar hemiarthroplasty  10/12/20 for left hip fracture, healed.   Essential hypertension blood pressure is controlled, on amlodipine 43m qd, Metoprolol 37.545mqd. Bun/creat 12/0.55 11/07/20  Venous (peripheral) insufficiency peripheral edema, on Furosemide 4058md. Cardiology f/u   Esophageal reflux stable, on Omeprazole.   Anxiety and depression Depression/anxiety, lifelong issue, but sleeps/eats at her baseline.    Slow transit constipation Stable,  takes MiraLax qd     Family/ staff Communication: plan of care reviewed with the patient and charge nurse.   Labs/tests ordered:  None  Time spend 40 minutes.

## 2021-04-09 NOTE — Assessment & Plan Note (Signed)
Hgb 10.5 11/07/20  takes Fe.

## 2021-04-09 NOTE — Assessment & Plan Note (Signed)
blood pressure is controlled, on amlodipine 5mg  qd, Metoprolol 37.5mg  qd. Bun/creat 12/0.55 11/07/20

## 2021-04-09 NOTE — Assessment & Plan Note (Signed)
Depression/anxiety, lifelong issue, but sleeps/eats at her baseline.

## 2021-04-09 NOTE — Assessment & Plan Note (Signed)
S/p left bipolar hemiarthroplasty 10/12/20 for left hip fracture, healed.

## 2021-04-09 NOTE — Assessment & Plan Note (Signed)
Safety awareness needed

## 2021-04-10 ENCOUNTER — Encounter: Payer: Self-pay | Admitting: Nurse Practitioner

## 2021-04-10 DIAGNOSIS — I10 Essential (primary) hypertension: Secondary | ICD-10-CM | POA: Diagnosis not present

## 2021-04-11 LAB — BASIC METABOLIC PANEL
BUN: 18 (ref 4–21)
CO2: 26 — AB (ref 13–22)
Chloride: 103 (ref 99–108)
Creatinine: 0.5 (ref 0.5–1.1)
Glucose: 81
Potassium: 4.3 (ref 3.4–5.3)
Sodium: 137 (ref 137–147)

## 2021-04-11 LAB — HEPATIC FUNCTION PANEL
ALT: 14 (ref 7–35)
AST: 13 (ref 13–35)
Alkaline Phosphatase: 101 (ref 25–125)
Bilirubin, Total: 0.7

## 2021-04-11 LAB — COMPREHENSIVE METABOLIC PANEL
Albumin: 4 (ref 3.5–5.0)
Calcium: 9.2 (ref 8.7–10.7)
Globulin: 1.7

## 2021-04-11 LAB — CBC AND DIFFERENTIAL
HCT: 39 (ref 36–46)
Hemoglobin: 12.7 (ref 12.0–16.0)
Neutrophils Absolute: 4115
Platelets: 268 (ref 150–399)
WBC: 6.5

## 2021-04-11 LAB — CBC: RBC: 4.18 (ref 3.87–5.11)

## 2021-05-25 ENCOUNTER — Encounter: Payer: Self-pay | Admitting: Internal Medicine

## 2021-05-25 ENCOUNTER — Non-Acute Institutional Stay: Payer: MEDICARE | Admitting: Internal Medicine

## 2021-05-25 DIAGNOSIS — M810 Age-related osteoporosis without current pathological fracture: Secondary | ICD-10-CM

## 2021-05-25 DIAGNOSIS — F32A Depression, unspecified: Secondary | ICD-10-CM

## 2021-05-25 DIAGNOSIS — D649 Anemia, unspecified: Secondary | ICD-10-CM

## 2021-05-25 DIAGNOSIS — I10 Essential (primary) hypertension: Secondary | ICD-10-CM

## 2021-05-25 DIAGNOSIS — F419 Anxiety disorder, unspecified: Secondary | ICD-10-CM

## 2021-05-25 DIAGNOSIS — Z96649 Presence of unspecified artificial hip joint: Secondary | ICD-10-CM

## 2021-05-25 DIAGNOSIS — K219 Gastro-esophageal reflux disease without esophagitis: Secondary | ICD-10-CM | POA: Diagnosis not present

## 2021-05-25 NOTE — Progress Notes (Signed)
Location:  [place] Room Number: [phone] Place of Service:  ALF [phone])  Provider:   Code Status: DNR Goals of Care:  Advanced Directives 05/25/2021  Does Patient Have a Medical Advance Directive? Yes  Type of Paramedic of [place];Out of facility DNR (pink MOST or yellow form)  Does patient want to make changes to medical advance directive? No - Patient declined  Copy of [place] in Chart? Yes - validated most recent copy scanned in chart (See row information)  Would patient like information on creating a medical advance directive? -  Pre-existing out of facility DNR order (yellow form or pink MOST form) Yellow form placed in chart (order not valid for inpatient use);Pink MOST form placed in chart (order not valid for inpatient use)     Chief Complaint  Patient presents with  . Medical Management of Chronic Issues  . Health Maintenance    Zoster vaccine needed    HPI: Patient is a 87 y.o. female seen today for medical management of chronic diseases.   Has history of diastolic CHF with echo showing LVH and severe left atrial enlargement possible amyloidosis hypertension, history of palpitations,CAD, history of right hip fracture in 9/20,osteoporosis and anemia,hypertensive retinopathy  Doing well in AL walks with her walker No Pain or any other discomfort Has gained some weight  Denies SOB or Chest congestion Mood is stable  Past Medical History:  Diagnosis Date  . Aortic heart murmur    AS/AI on 2D ECHO ; SBE prophylaxis Rxed  . Chest pain 2007 ; 2011   negative cardiac assessment  . GERD (gastroesophageal reflux disease)   . Hyperlipidemia   . Hypertension [  . Interstitial cystitis    Resolved  . Ocular migraine 04/10/2012  . Osteoporosis    T score - 3.0 @ femoral neck; Fosamax affected swallowing  . Peripheral vascular disease ([place])   . Postmenopausal    No PMH or HRT    Past Surgical  History:  Procedure Laterality Date  . ANTERIOR APPROACH HEMI HIP ARTHROPLASTY Right 09/03/2019   Procedure: ANTERIOR APPROACH HEMI HIP ARTHROPLASTY BIPOLAR;  Surgeon: [name], MD;  Location: [facility];  Service: Orthopedics;  Laterality: Right;  . COLONOSCOPY  2011   Dr Sharlett Iles  . HIP ARTHROPLASTY Left 10/11/2020   Procedure: ARTHROPLASTY BIPOLAR HIP (HEMIARTHROPLASTY) ANTERIOR APPROACH;  Surgeon: [name], MD;  Location: [facility];  Service: Orthopedics;  Laterality: Left;    Allergies  Allergen Reactions  . Amitid [Amitriptyline Hcl]     Patient experienced a dizzy, spinning feeling   . Metronidazole     REACTION: throat swells  . Penicillins     REACTION: rash & fever  . Alendronate Sodium     REACTION: unspecified  . Amoxicillin Nausea And Vomiting  . Clarithromycin     GI intolerance  . Flonase [Fluticasone Propionate]     Slight Headache   . Promethazine Hcl     REACTION: unspecified  . Robitussin (Alcohol Free) [Guaifenesin] Other (See Comments)    unknown hyperactive  . Spironolactone     09/21/13 weakness in legs  . Singulair [Montelukast Sodium]     "spacey"; ineffective    Outpatient Encounter Medications as of 05/25/2021  Medication Sig  . acetaminophen (TYLENOL) 325 MG tablet Take 650 mg by mouth in the morning and at bedtime. Not to exceed 3000 mg per 24 hours.  Marland Kitchen acetaminophen (TYLENOL) 500 MG tablet Take 1,000 mg by  mouth every 6 (six) hours as needed for moderate pain.  Marland Kitchen amLODipine (NORVASC) 5 MG tablet TAKE 1 TABLET BY MOUTH EVERY DAY IN THE EVENING  . aspirin EC 81 MG tablet Take 2 tablets (162 mg total) by mouth 2 (two) times daily after a meal.  . docusate sodium (COLACE) 100 MG capsule Take 1 capsule (100 mg total) by mouth 2 (two) times daily as needed (for constipation).  . ferrous sulfate 325 (65 FE) MG tablet Take 325 mg by mouth daily with breakfast. Once A Day on Mon, Wed, Fri  . fexofenadine (ALLEGRA) 180 MG tablet Take 180 mg by mouth daily  as needed for allergies or rhinitis.   . furosemide (LASIX) 40 MG tablet Take 1 tablet (40 mg total) by mouth daily.  Marland Kitchen HYDROcodone-acetaminophen (NORCO/VICODIN) 5-325 MG tablet Take 1 tablet by mouth every 6 (six) hours as needed for moderate pain. Start date : 10/09/20  . Menthol, Topical Analgesic, (BIOFREEZE) 4 % GEL Apply 1 application topically 2 (two) times daily as needed (left knee pain).   . Methocarbamol (ROBAXIN PO) Take 250 mg by mouth every 8 (eight) hours as needed.  . metoprolol succinate (TOPROL-XL) 25 MG 24 hr tablet TAKE 1.5 TABLETS (37.5 MG TOTAL) BY MOUTH DAILY.  . Multiple Vitamin (MULTIVITAMIN) tablet Take 1 tablet by mouth daily. chewable  . omeprazole (PRILOSEC) 20 MG capsule Take 20 mg by mouth daily.  . polyethylene glycol (MIRALAX / GLYCOLAX) 17 g packet Take 17 g by mouth daily.  . potassium chloride SA (KLOR-CON) 20 MEQ tablet Take 20 mEq by mouth daily.   No facility-administered encounter medications on file as of 05/25/2021.    Review of Systems:  Review of Systems Review of Systems  Constitutional: Negative for activity change, appetite change, chills, diaphoresis, fatigue and fever.  HENT: Negative for mouth sores, postnasal drip, rhinorrhea, sinus pain and sore throat.   Respiratory: Negative for apnea, cough, chest tightness, shortness of breath and wheezing.   Cardiovascular: Negative for chest pain, palpitations and leg swelling.  Gastrointestinal: Negative for abdominal distention, abdominal pain, constipation, diarrhea, nausea and vomiting.  Genitourinary: Negative for dysuria and frequency.  Musculoskeletal: Negative for arthralgias, joint swelling and myalgias.  Skin: Negative for rash.  Neurological: Negative for dizziness, syncope, weakness, light-headedness and numbness.  Psychiatric/Behavioral: Negative for behavioral problems, confusion and sleep disturbance.    Health Maintenance  Topic Date Due  . Zoster Vaccines- Shingrix (1 of 2) Never  done  . INFLUENZA VACCINE  07/30/2021  . TETANUS/TDAP  07/11/2025  . DEXA SCAN  Completed  . COVID-19 Vaccine  Completed  . PNA vac Low Risk Adult  Completed  . HPV VACCINES  Aged Out    Physical Exam: Vitals:   05/25/21 1424  BP: (!) 168/84  Pulse: 72  Resp: 18  Temp: 98.3 F (36.8 C)  SpO2: 98%  Weight: 125 lb 9.6 oz (57 kg)  Height: 5\' 4"  (1.626 m)   Body mass index is 21.56 kg/m. Physical Exam Constitutional: Oriented to person, place, and time. Well-developed and well-nourished.  HENT:  Head: Normocephalic.  Mouth/Throat: Oropharynx is clear and moist.  Eyes: Pupils are equal, round, and reactive to light.  Neck: Neck supple.  Cardiovascular: Normal rate and normal heart sounds.  No murmur heard. Pulmonary/Chest: Effort normal and breath sounds normal. No respiratory distress. No wheezes. She has no rales.  Abdominal: Soft. Bowel sounds are normal. No distension. There is no tenderness. There is no rebound.  Musculoskeletal: No  edema.  Lymphadenopathy: none Neurological: Alert and oriented to person, place, and time.  Skin: Skin is warm and dry.  Psychiatric: Normal mood and affect. Behavior is normal. Thought content normal.   Labs reviewed: Basic Metabolic Panel: Recent Labs    10/11/20 0340 10/12/20 0020 10/12/20 0656 10/25/20 0000 01/12/21 0000 04/11/21 0000  NA 135 131* 133* 140 136* 137  K 3.3* 4.0 3.8 3.8 4.2 4.3  CL 99 98 98 101 104 103  CO2 27 27 27  32* 26* 26*  GLUCOSE 103* 197* 148*  --   --   --   BUN 19 16 12 7 10 18   CREATININE 0.47 0.44 0.44 0.5 0.4* 0.5  CALCIUM 9.0 8.8* 8.9 8.9 9.1 9.2   Liver Function Tests: Recent Labs    10/10/20 1806 10/25/20 0000 01/12/21 0000 04/11/21 0000  AST 14* 10* 11* 13  ALT 16 10 11 14   ALKPHOS 114 108 103 101  BILITOT 0.6  --   --   --   PROT 6.5  --   --   --   ALBUMIN 4.0 2.7* 3.6 4.0   No results for input(s): LIPASE, AMYLASE in the last 8760 hours. No results for input(s): AMMONIA in the  last 8760 hours. CBC: Recent Labs    10/10/20 1806 10/11/20 0340 10/12/20 0656 10/13/20 0309 10/25/20 0000 10/31/20 0000 01/12/21 0000 04/11/21 0000  WBC 8.9 7.4 12.1* 11.1* 5.8 4.4 5.8 6.5  NEUTROABS 7.4  --   --   --   --   --  3,265.00 4,115.00  HGB 13.3 11.5* 10.8* 10.5* 8.9* 8.9* 11.2* 12.7  HCT 40.9 34.9* 32.7* 32.5* 27* 26* 34* 39  MCV 94.9 92.6 94.2 93.9  --   --   --   --   PLT 353 284 287 273 375  --  237 268   Lipid Panel: No results for input(s): CHOL, HDL, LDLCALC, TRIG, CHOLHDL, LDLDIRECT in the last 8760 hours. Lab Results  Component Value Date   HGBA1C 5.5 07/12/2015    Procedures since last visit: No results found.  Assessment/Plan 1. Essential hypertension BP Running High. ? Had slow HR. Will not increase Toprol  Will increase her Norvasc to 7.5 mg QD Check Her BP and HR qod for few readings 2. Anxiety and depression On no Meds Staying good  3. Gastroesophageal reflux disease without esophagitis On Omeprazole  4. Anemia, unspecified type Repeat CBC Discontinue Iron  5. Age-related osteoporosis without current pathological fracture Refuses Prolia or Fosamax Wants to continue Calcium and Vit D  6. S/P hip hemiarthroplasty Pain is Controlled Discontinue NOrco and Robaxin  7 Diaasolic CHF with possible Amyloidosis Doing well on Lasix Repeat CMP  Labs/tests ordered: CBC,CMP,TSH

## 2021-05-25 NOTE — Progress Notes (Deleted)
Location:    Room Number: [place] of Service:  [facility] [phone]) Provider:  Veleta Miners MD  [name], MD  Patient Care Team: [name], MD as PCP - General (Internal Medicine) [name], MD as Consulting Physician (Orthopedic Surgery)  Extended Emergency Contact Information Primary Emergency Contact: Alicia Alicia Boyd Address: [place]          [place] [address] Alicia Alicia Boyd of [place] Phone: [phone] Relation: Relative Secondary Emergency Contact: Rocky Crafts Address: [address]          [place], [place] [address] Alicia Alicia Boyd of [place] Phone: [phone] Mobile Phone: [phone] Relation: Relative  Code Status:  Full Code Goals of care: Advanced Directive information Advanced Directives 05/25/2021  Does Patient Have a Medical Advance Directive? Yes  Type of Paramedic of [place];Out of facility DNR (pink MOST or yellow form)  Does patient want to make changes to medical advance directive? No - Patient declined  Copy of [place] in Chart? Yes - validated most recent copy scanned in chart (See row information)  Would patient like information on creating a medical advance directive? -  Pre-existing out of facility DNR order (yellow form or pink MOST form) Yellow form placed in chart (order not valid for inpatient use);Pink MOST form placed in chart (order not valid for inpatient use)     Chief Complaint  Patient presents with  . Medical Management of Chronic Issues  . Health Maintenance    Zoster vaccine needed    HPI:  Pt is a 87 y.o. Alicia Boyd seen today for medical management of chronic diseases.     Past Medical History:  Diagnosis Date  . Aortic heart murmur    AS/AI on 2D ECHO ; SBE prophylaxis Rxed  . Chest pain 2007 ; 2011   negative cardiac assessment  . GERD (gastroesophageal reflux disease)   . Hyperlipidemia   .  Hypertension [  . Interstitial cystitis    Resolved  . Ocular migraine 04/10/2012  . Osteoporosis    T score - 3.0 @ femoral neck; Fosamax affected swallowing  . Peripheral vascular disease ([place])   . Postmenopausal    No PMH or HRT   Past Surgical History:  Procedure Laterality Date  . ANTERIOR APPROACH HEMI HIP ARTHROPLASTY Right 09/03/2019   Procedure: ANTERIOR APPROACH HEMI HIP ARTHROPLASTY BIPOLAR;  Surgeon: [name], MD;  Location: [facility];  Service: Orthopedics;  Laterality: Right;  . COLONOSCOPY  2011   Dr Sharlett Iles  . HIP ARTHROPLASTY Left 10/11/2020   Procedure: ARTHROPLASTY BIPOLAR HIP (HEMIARTHROPLASTY) ANTERIOR APPROACH;  Surgeon: [name], MD;  Location: [facility];  Service: Orthopedics;  Laterality: Left;    Allergies  Allergen Reactions  . Amitid [Amitriptyline Hcl]     Patient experienced a dizzy, spinning feeling   . Metronidazole     REACTION: throat swells  . Penicillins     REACTION: rash & fever  . Alendronate Sodium     REACTION: unspecified  . Amoxicillin Nausea And Vomiting  . Clarithromycin     GI intolerance  . Flonase [Fluticasone Propionate]     Slight Headache   . Promethazine Hcl     REACTION: unspecified  . Robitussin (Alcohol Free) [Guaifenesin] Other (See Comments)    unknown hyperactive  . Spironolactone     09/21/13 weakness in legs  . Singulair [Montelukast Sodium]     "  spacey"; ineffective    Allergies as of 05/25/2021      Reactions   Amitid [amitriptyline Hcl]    Patient experienced a dizzy, spinning feeling    Metronidazole    REACTION: throat swells   Penicillins    REACTION: rash & fever   Alendronate Sodium    REACTION: unspecified   Amoxicillin Nausea And Vomiting   Clarithromycin    GI intolerance   Flonase [fluticasone Propionate]    Slight Headache    Promethazine Hcl    REACTION: unspecified   Robitussin (alcohol Free) [guaifenesin] Other (See Comments)   unknown hyperactive   Spironolactone    09/21/13  weakness in legs   Singulair [montelukast Sodium]    "spacey"; ineffective      Medication List       Accurate as of May 25, 2021  2:38 PM. If you have any questions, ask your nurse or doctor.        acetaminophen 500 MG tablet Commonly known as: TYLENOL Take 1,000 mg by mouth every 6 (six) hours as needed for moderate pain.   acetaminophen 325 MG tablet Commonly known as: TYLENOL Take 650 mg by mouth in the morning and at bedtime. Not to exceed 3000 mg per 24 hours.   amLODipine 5 MG tablet Commonly known as: NORVASC TAKE 1 TABLET BY MOUTH EVERY DAY IN THE EVENING   aspirin EC 81 MG tablet Take 2 tablets (162 mg total) by mouth 2 (two) times daily after a meal.   Biofreeze 4 % Gel Generic drug: Menthol (Topical Analgesic) Apply 1 application topically 2 (two) times daily as needed (left knee pain).   docusate sodium 100 MG capsule Commonly known as: COLACE Take 1 capsule (100 mg total) by mouth 2 (two) times daily as needed (for constipation).   ferrous sulfate 325 (65 FE) MG tablet Take 325 mg by mouth daily with breakfast. Once A Day on Mon, Wed, Fri   fexofenadine 180 MG tablet Commonly known as: ALLEGRA Take 180 mg by mouth daily as needed for allergies or rhinitis.   furosemide 40 MG tablet Commonly known as: LASIX Take 1 tablet (40 mg total) by mouth daily.   HYDROcodone-acetaminophen 5-325 MG tablet Commonly known as: NORCO/VICODIN Take 1 tablet by mouth every 6 (six) hours as needed for moderate pain. Start date : 10/09/20   metoprolol succinate 25 MG 24 hr tablet Commonly known as: TOPROL-XL TAKE 1.5 TABLETS (37.5 MG TOTAL) BY MOUTH DAILY.   multivitamin tablet Take 1 tablet by mouth daily. chewable   omeprazole 20 MG capsule Commonly known as: PRILOSEC Take 20 mg by mouth daily.   polyethylene glycol 17 g packet Commonly known as: MIRALAX / GLYCOLAX Take 17 g by mouth daily.   potassium chloride SA 20 MEQ tablet Commonly known as:  KLOR-CON Take 20 mEq by mouth daily.   ROBAXIN PO Take 250 mg by mouth every 8 (eight) hours as needed.       Review of Systems  Immunization History  Administered Date(s) Administered  . Fluad Quad(high Dose 65+) 10/05/2019  . Influenza Split 12/10/2011, 09/29/2012  . Influenza, High Dose Seasonal PF 09/21/2014, 08/27/2016, 08/27/2017, 09/23/2018  . Influenza,inj,Quad PF,6+ Mos 10/08/2013, 09/12/2015  . Influenza-Unspecified 10/27/2020  . Moderna Sars-Covid-2 Vaccination 11/07/2020  . PFIZER(Purple Top)SARS-COV-2 Vaccination 02/28/2020, 03/29/2020  . Pneumococcal Conjugate-13 08/22/2015  . Pneumococcal Polysaccharide-23 11/23/2008  . Td 03/23/2004  . Tdap 07/12/2015   Pertinent  Health Maintenance Due  Topic Date Due  . INFLUENZA  VACCINE  07/30/2021  . DEXA SCAN  Completed  . PNA vac Low Risk Adult  Completed   Fall Risk  09/23/2018 09/19/2017 09/04/2016 08/20/2016 12/20/2014  Falls in the past year? No No No No No  Comment - - Emmi Telephone Survey: data to providers prior to load - -   Functional Status Survey:    Vitals:   05/25/21 1424  BP: (!) 168/84  Pulse: 72  Resp: 18  Temp: 98.3 F (36.8 C)  SpO2: 98%  Weight: 125 lb 9.6 oz (57 kg)  Height: 5\' 4"  (1.626 m)   Body mass index is 21.56 kg/m. Physical Exam  Labs reviewed: Recent Labs    10/11/20 0340 10/12/20 0020 10/12/20 0656 10/25/20 0000 01/12/21 0000 04/11/21 0000  NA 135 131* 133* 140 136* 137  K 3.3* 4.0 3.8 3.8 4.2 4.3  CL 99 98 98 101 104 103  CO2 27 27 27  32* 26* 26*  GLUCOSE 103* 197* 148*  --   --   --   BUN 19 16 12 7 10 18   CREATININE 0.47 0.44 0.44 0.5 0.4* 0.5  CALCIUM 9.0 8.8* 8.9 8.9 9.1 9.2   Recent Labs    10/10/20 1806 10/25/20 0000 01/12/21 0000 04/11/21 0000  AST 14* 10* 11* 13  ALT 16 10 11 14   ALKPHOS 114 108 103 101  BILITOT 0.6  --   --   --   PROT 6.5  --   --   --   ALBUMIN 4.0 2.7* 3.6 4.0   Recent Labs    10/10/20 1806 10/11/20 0340 10/12/20 0656  10/13/20 0309 10/25/20 0000 10/31/20 0000 01/12/21 0000 04/11/21 0000  WBC 8.9 7.4 12.1* 11.1* 5.8 4.4 5.8 6.5  NEUTROABS 7.4  --   --   --   --   --  3,265.00 4,115.00  HGB 13.3 11.5* 10.8* 10.5* 8.9* 8.9* 11.2* 12.7  HCT 40.9 34.9* 32.7* 32.5* 27* 26* 34* 39  MCV 94.9 92.6 94.2 93.9  --   --   --   --   PLT 353 284 287 273 375  --  237 268   Lab Results  Component Value Date   TSH 1.90 05/11/2020   Lab Results  Component Value Date   HGBA1C 5.5 07/12/2015   Lab Results  Component Value Date   CHOL 227 (H) 06/24/2013   HDL 89.10 06/24/2013   LDLDIRECT 114.7 06/24/2013   TRIG 80.0 06/24/2013   CHOLHDL 3 06/24/2013    Significant Diagnostic Results in last 30 days:  No results found.  Assessment/Plan There are no diagnoses linked to this encounter.   Family/ staff Communication:   Labs/tests ordered:

## 2021-06-01 NOTE — Progress Notes (Signed)
HPI:  FU CHF and possible amyloidosis. Pt previously admitted with CHF symptoms following prior admission for repair of right hip fracture. CTA showed no pulmonary embolus, small bilateral pleural effusions, pulmonary edema and three-vessel coronary calcification. Echocardiogram 10/20 showed normal LV function, moderate left ventricular hypertrophy, grade 2 diastolic dysfunction, severe left atrial enlargement; consider amyloidosis.  At previous office visit patient requested no further evaluation for amyloid which I felt was appropriate given her age. Had repair of left femoral neck fracture October 2021. Since last seen she denies dyspnea, chest pain or syncope.  No pedal edema.  She does note occasional palpitations that are very brief and infrequent.  Described as a flutter.  Current Outpatient Medications  Medication Sig Dispense Refill   acetaminophen (TYLENOL) 325 MG tablet Take 650 mg by mouth in the morning and at bedtime. Not to exceed 3000 mg per 24 hours.     acetaminophen (TYLENOL) 500 MG tablet Take 1,000 mg by mouth every 6 (six) hours as needed for moderate pain.     amLODipine (NORVASC) 5 MG tablet TAKE 1 TABLET BY MOUTH EVERY DAY IN THE EVENING (Patient taking differently: 7.5 mg.) 90 tablet 1   aspirin EC 81 MG tablet Take 2 tablets (162 mg total) by mouth 2 (two) times daily after a meal. (Patient taking differently: Take 81 mg by mouth daily.) 60 tablet 0   docusate sodium (COLACE) 100 MG capsule Take 1 capsule (100 mg total) by mouth 2 (two) times daily as needed (for constipation). 30 capsule 0   ferrous sulfate 325 (65 FE) MG tablet Take 325 mg by mouth daily with breakfast.     fexofenadine (ALLEGRA) 180 MG tablet Take 180 mg by mouth daily as needed for allergies or rhinitis.      furosemide (LASIX) 40 MG tablet Take 1 tablet (40 mg total) by mouth daily. 90 tablet 1   HYDROcodone-acetaminophen (NORCO/VICODIN) 5-325 MG tablet Take 1 tablet by mouth every 6 (six) hours  as needed for moderate pain.     Menthol, Topical Analgesic, (BIOFREEZE) 4 % GEL Apply 1 application topically 2 (two) times daily as needed (left knee pain).      metoprolol succinate (TOPROL-XL) 25 MG 24 hr tablet TAKE 1.5 TABLETS (37.5 MG TOTAL) BY MOUTH DAILY. 90 tablet 2   Multiple Vitamin (MULTIVITAMIN) tablet Take 1 tablet by mouth daily. chewable     omeprazole (PRILOSEC) 20 MG capsule Take 20 mg by mouth daily.     polyethylene glycol (MIRALAX / GLYCOLAX) 17 g packet Take 17 g by mouth daily.     potassium chloride SA (KLOR-CON) 20 MEQ tablet Take 20 mEq by mouth daily.     No current facility-administered medications for this visit.     Past Medical History:  Diagnosis Date   Aortic heart murmur    AS/AI on 2D ECHO ; SBE prophylaxis Rxed   Chest pain 2007 ; 2011   negative cardiac assessment   GERD (gastroesophageal reflux disease)    Hyperlipidemia    Hypertension [   Interstitial cystitis    Resolved   Ocular migraine 04/10/2012   Osteoporosis    T score - 3.0 @ femoral neck; Fosamax affected swallowing   Peripheral vascular disease ([facility])    Postmenopausal    No PMH or HRT    Past Surgical History:  Procedure Laterality Date   ANTERIOR APPROACH HEMI HIP ARTHROPLASTY Right 09/03/2019   Procedure: ANTERIOR APPROACH HEMI HIP ARTHROPLASTY BIPOLAR;  Surgeon: [name], MD;  Location: [facility];  Service: Orthopedics;  Laterality: Right;   COLONOSCOPY  2011   Dr Sharlett Iles   HIP ARTHROPLASTY Left 10/11/2020   Procedure: ARTHROPLASTY BIPOLAR HIP (HEMIARTHROPLASTY) ANTERIOR APPROACH;  Surgeon: [name], MD;  Location: [facility];  Service: Orthopedics;  Laterality: Left;    Social History   Socioeconomic History   Marital status: Widowed    Spouse name: Not on file   Number of children: 1   Years of education: Not on file   Highest education level: Not on file  Occupational History   Not on file  Tobacco Use   Smoking status: Never   Smokeless tobacco: Never   Substance and Sexual Activity   Alcohol use: No   Drug use: No   Sexual activity: Not Currently    Birth control/protection: None  Other Topics Concern   Not on file  Social History Narrative   Not on file   Social Determinants of Health   Financial Resource Strain: Not on file  Food Insecurity: Not on file  Transportation Needs: Not on file  Physical Activity: Not on file  Stress: Not on file  Social Connections: Not on file  Intimate Partner Violence: Not on file    Family History  Problem Relation Age of Onset   Diabetes Father        Pancreatitic insufficiency post MVA   Hypertension Mother    Osteoporosis Mother    Heart disease Mother        AF   Thyroid disease Sister        hypothyroidism   Coronary artery disease Sister        AF   Stroke Sister 64       also pulmonary disease    ROS: no fevers or chills, productive cough, hemoptysis, dysphasia, odynophagia, melena, hematochezia, dysuria, hematuria, rash, seizure activity, orthopnea, PND, pedal edema, claudication. Remaining systems are negative.  Physical Exam: Well-developed well-nourished in no acute distress.  Skin is warm and dry.  HEENT is normal.  Neck is supple.  Chest is clear to auscultation with normal expansion.  Cardiovascular exam is regular rate and rhythm.  Abdominal exam nontender or distended. No masses palpated. Extremities show no edema. neuro grossly intact  A/P  1 chronic diastolic congestive heart failure-patient appears to be euvolemic on examination.  Continue Lasix.  Continue fluid restriction and low-sodium diet.  Check potassium and renal function.  2 hypertension-blood pressure elevated.  Increase amlodipine to 10 mg daily and follow.  3 coronary artery disease-based on calcification noted on previous CT scan-we will continue aspirin.  4 possible amyloid-questioned on previous echocardiogram.  However given her age she wanted only conservative measures with no  aggressive evaluation and I feel this is appropriate.  5 palpitations-occasional brief palpitations.  Last seconds.  We will consider an event monitor in the future if her symptoms worsen.  Kirk Ruths, MD

## 2021-06-15 ENCOUNTER — Encounter: Payer: Self-pay | Admitting: Cardiology

## 2021-06-15 ENCOUNTER — Ambulatory Visit (INDEPENDENT_AMBULATORY_CARE_PROVIDER_SITE_OTHER): Payer: MEDICARE | Admitting: Cardiology

## 2021-06-15 ENCOUNTER — Other Ambulatory Visit: Payer: Self-pay

## 2021-06-15 VITALS — BP 158/74 | HR 80 | Ht 65.0 in | Wt 129.9 lb

## 2021-06-15 DIAGNOSIS — R002 Palpitations: Secondary | ICD-10-CM

## 2021-06-15 DIAGNOSIS — I10 Essential (primary) hypertension: Secondary | ICD-10-CM

## 2021-06-15 DIAGNOSIS — I5032 Chronic diastolic (congestive) heart failure: Secondary | ICD-10-CM | POA: Diagnosis not present

## 2021-06-15 LAB — BASIC METABOLIC PANEL
BUN/Creatinine Ratio: 32 — ABNORMAL HIGH (ref 12–28)
BUN: 16 mg/dL (ref 8–27)
CO2: 25 mmol/L (ref 20–29)
Calcium: 9.9 mg/dL (ref 8.7–10.3)
Chloride: 101 mmol/L (ref 96–106)
Creatinine, Ser: 0.5 mg/dL — ABNORMAL LOW (ref 0.57–1.00)
Glucose: 87 mg/dL (ref 65–99)
Potassium: 5 mmol/L (ref 3.5–5.2)
Sodium: 139 mmol/L (ref 134–144)
eGFR: 90 mL/min/{1.73_m2} (ref >59)

## 2021-06-15 MED ORDER — AMLODIPINE BESYLATE 10 MG PO TABS: 10.0000 mg | ORAL_TABLET | Freq: Every day | ORAL | 3 refills | Status: AC

## 2021-06-15 NOTE — Patient Instructions (Addendum)
Medications:  INCREASE AMLODIPINE TO 10 MG ONCE DAILY  Follow-Up: At [facility], you and your health needs are our priority.  As part of our continuing mission to provide you with exceptional heart care, we have created designated Provider Care Teams.  These Care Teams include your primary Cardiologist (physician) and Advanced Practice Providers (APPs -  Physician Assistants and Nurse Practitioners) who all work together to provide you with the care you need, when you need it.  We recommend signing up for the patient portal called "MyChart".  Sign up information is provided on this After Visit Summary.  MyChart is used to connect with patients for Virtual Visits (Telemedicine).  Patients are able to view lab/test results, encounter notes, upcoming appointments, etc.  Non-urgent messages can be sent to your provider as well.   To learn more about what you can do with MyChart, go to [email or web address].    Your next appointment:   6 month(s)  The format for your next appointment:   In Person  Provider:   Kirk Ruths, MD

## 2021-06-18 ENCOUNTER — Encounter: Payer: Self-pay | Admitting: *Deleted

## 2021-08-09 DIAGNOSIS — M25552 Pain in left hip: Secondary | ICD-10-CM | POA: Diagnosis not present

## 2021-08-09 DIAGNOSIS — M25551 Pain in right hip: Secondary | ICD-10-CM | POA: Diagnosis not present

## 2021-09-20 ENCOUNTER — Non-Acute Institutional Stay: Payer: MEDICARE | Admitting: Orthopedic Surgery

## 2021-09-20 ENCOUNTER — Encounter: Payer: Self-pay | Admitting: Orthopedic Surgery

## 2021-09-20 DIAGNOSIS — K219 Gastro-esophageal reflux disease without esophagitis: Secondary | ICD-10-CM | POA: Diagnosis not present

## 2021-09-20 DIAGNOSIS — I10 Essential (primary) hypertension: Secondary | ICD-10-CM

## 2021-09-20 DIAGNOSIS — F419 Anxiety disorder, unspecified: Secondary | ICD-10-CM

## 2021-09-20 DIAGNOSIS — M79605 Pain in left leg: Secondary | ICD-10-CM | POA: Diagnosis not present

## 2021-09-20 DIAGNOSIS — M810 Age-related osteoporosis without current pathological fracture: Secondary | ICD-10-CM | POA: Diagnosis not present

## 2021-09-20 DIAGNOSIS — K5901 Slow transit constipation: Secondary | ICD-10-CM

## 2021-09-20 DIAGNOSIS — D692 Other nonthrombocytopenic purpura: Secondary | ICD-10-CM

## 2021-09-20 DIAGNOSIS — F32A Depression, unspecified: Secondary | ICD-10-CM

## 2021-09-20 NOTE — Progress Notes (Signed)
Location:   [facility] Room Number: 905-A Place of Service:  ALF [phone]) Provider:  Windell Moulding, NP    Patient Care Team: [name], MD as PCP - General (Internal Medicine) [name], MD as Consulting Physician (Orthopedic Surgery)  Extended Emergency Contact Information Primary Emergency Contact: [name] Address: [address]          [place] [address] [name] of [place] Phone: [phone] Relation: Relative Secondary Emergency Contact: Rocky Crafts Address: [address]          [place], [place] [address] [name] of [place] Phone: [phone] Mobile Phone: [phone] Relation: Relative  Code Status:  FULL CODE Goals of care: Advanced Directive information Advanced Directives 09/20/2021  Does Patient Have a Medical Advance Directive? Yes  Type of Paramedic of [place];Living will  Does patient want to make changes to medical advance directive? No - Patient declined  Copy of [place] in Chart? Yes - validated most recent copy scanned in chart (See row information)  Would patient like information on creating a medical advance directive? -  Pre-existing out of facility DNR order (yellow form or pink MOST form) -     Chief Complaint  Patient presents with   Medical Management of Chronic Issues    Routine Visit.   Immunizations    Discuss the need for Shingrix vaccine, and Influenza vaccine.    HPI:  Pt is a 87 y.o. female seen today for medical management of chronic diseases.    She currently resides on the assisted living unit at [facility]. Past medical history: HTN, CHF,venous insufficiency, reflux, dysphagia, constipation, OA, osteoporosis, anemia, unsteady gait.   HTN- BUN/creat 16/0.50 06/15/2021, metoprolol and Norvasc, regular diet GERD- no increased reflux, hgb 12.7 03/2021, Prilosec Depression/anxiety- tearful this  morning during encounter, nephew shared poor health prognosis with her, happy in AL, not on medication Osteoporosis- DEXA 2021, not on supplementation OA- denies joint pain today, tylenol prn, Biofreeze left knee prn Constipation- LBM 09/22, colace and miralax daily Bruising left arm- she has noticed painless bruises to left forearm, reports mother has same spots with age  No recent falls, injuries or behavioral outbursts.   Recent blood pressures:  09/19- 137/81  09/12- 139/73  09/05- 130/64       Past Medical History:  Diagnosis Date   Aortic heart murmur    AS/AI on 2D ECHO ; SBE prophylaxis Rxed   Chest pain 2007 ; 2011   negative cardiac assessment   GERD (gastroesophageal reflux disease)    Hyperlipidemia    Hypertension [   Interstitial cystitis    Resolved   Ocular migraine 04/10/2012   Osteoporosis    T score - 3.0 @ femoral neck; Fosamax affected swallowing   Peripheral vascular disease ([place])    Postmenopausal    No PMH or HRT   Past Surgical History:  Procedure Laterality Date   ANTERIOR APPROACH HEMI HIP ARTHROPLASTY Right 09/03/2019   Procedure: ANTERIOR APPROACH HEMI HIP ARTHROPLASTY BIPOLAR;  Surgeon: [name], MD;  Location: [facility];  Service: Orthopedics;  Laterality: Right;   COLONOSCOPY  2011   Dr Sharlett Iles   HIP ARTHROPLASTY Left 10/11/2020   Procedure: ARTHROPLASTY BIPOLAR HIP (HEMIARTHROPLASTY) ANTERIOR APPROACH;  Surgeon: [name], MD;  Location: [facility];  Service: Orthopedics;  Laterality: Left;    Allergies  Allergen Reactions  Amitid [Amitriptyline Hcl]     Patient experienced a dizzy, spinning feeling    Metronidazole     REACTION: throat swells   Penicillins     REACTION: rash & fever   Alendronate Sodium     REACTION: unspecified   Amoxicillin Nausea And Vomiting   Clarithromycin     GI intolerance   Flonase [Fluticasone Propionate]     Slight Headache    Promethazine Hcl     REACTION: unspecified   Robitussin (Alcohol  Free) [Guaifenesin] Other (See Comments)    unknown hyperactive   Spironolactone     09/21/13 weakness in legs   Singulair [Montelukast Sodium]     "spacey"; ineffective    Allergies as of 09/20/2021       Reactions   Amitid [amitriptyline Hcl]    Patient experienced a dizzy, spinning feeling    Metronidazole    REACTION: throat swells   Penicillins    REACTION: rash & fever   Alendronate Sodium    REACTION: unspecified   Amoxicillin Nausea And Vomiting   Clarithromycin    GI intolerance   Flonase [fluticasone Propionate]    Slight Headache    Promethazine Hcl    REACTION: unspecified   Robitussin (alcohol Free) [guaifenesin] Other (See Comments)   unknown hyperactive   Spironolactone    09/21/13 weakness in legs   Singulair [montelukast Sodium]    "spacey"; ineffective        Medication List        Accurate as of September 20, 2021 10:38 AM. If you have any questions, ask your nurse or doctor.          STOP taking these medications    ferrous sulfate 325 (65 FE) MG tablet Stopped by: Yvonna Alanis, NP   HYDROcodone-acetaminophen 5-325 MG tablet Commonly known as: NORCO/VICODIN Stopped by: Yvonna Alanis, NP       TAKE these medications    acetaminophen 500 MG tablet Commonly known as: TYLENOL Take 1,000 mg by mouth every 6 (six) hours as needed for moderate pain.   acetaminophen 325 MG tablet Commonly known as: TYLENOL Take 650 mg by mouth in the morning and at bedtime. Not to exceed 3000 mg per 24 hours.   amLODipine 10 MG tablet Commonly known as: NORVASC Take 1 tablet (10 mg total) by mouth daily.   aspirin EC 81 MG tablet Take 81 mg by mouth daily. Swallow whole.   Biofreeze 4 % Gel Generic drug: Menthol (Topical Analgesic) Apply 1 application topically 2 (two) times daily as needed (left knee pain).   docusate sodium 100 MG capsule Commonly known as: COLACE Take 1 capsule (100 mg total) by mouth 2 (two) times daily as needed (for  constipation).   fexofenadine 180 MG tablet Commonly known as: ALLEGRA Take 180 mg by mouth daily as needed for allergies or rhinitis.   furosemide 40 MG tablet Commonly known as: LASIX Take 1 tablet (40 mg total) by mouth daily.   metoprolol succinate 25 MG 24 hr tablet Commonly known as: TOPROL-XL TAKE 1.5 TABLETS (37.5 MG TOTAL) BY MOUTH DAILY.   Multivitamin Adult Chew Chew 400 mg by mouth daily.   omeprazole 20 MG capsule Commonly known as: PRILOSEC Take 20 mg by mouth daily.   polyethylene glycol 17 g packet Commonly known as: MIRALAX / GLYCOLAX Take 17 g by mouth daily.   potassium chloride SA 20 MEQ tablet Commonly known as: KLOR-CON Take 20 mEq by mouth daily.  Review of Systems  Constitutional:  Negative for activity change, appetite change, chills, fatigue and fever.  HENT:  Negative for dental problem, hearing loss and trouble swallowing.   Eyes:  Negative for visual disturbance.  Respiratory:  Negative for cough, shortness of breath and wheezing.   Cardiovascular:  Negative for chest pain and leg swelling.  Gastrointestinal:  Positive for constipation. Negative for abdominal distention, abdominal pain, diarrhea and nausea.  Genitourinary:  Negative for dysuria, frequency and hematuria.  Musculoskeletal:  Positive for arthralgias and gait problem.  Skin:        Bruising to left arm  Neurological:  Positive for weakness. Negative for dizziness and headaches.  Psychiatric/Behavioral:  Positive for dysphoric mood. Negative for confusion and sleep disturbance. The patient is not nervous/anxious.    Immunization History  Administered Date(s) Administered   Fluad Quad(high Dose 65+) 10/05/2019   Influenza Split 12/10/2011, 09/29/2012   Influenza, High Dose Seasonal PF 09/21/2014, 08/27/2016, 08/27/2017, 09/23/2018   Influenza,inj,Quad PF,6+ Mos 10/08/2013, 09/12/2015   Influenza-Unspecified 10/27/2020   Moderna Sars-Covid-2 Vaccination 11/07/2020    PFIZER(Purple Top)SARS-COV-2 Vaccination 02/28/2020, 03/29/2020, 11/07/2020, 05/29/2021, 09/18/2021   Pneumococcal Conjugate-13 08/22/2015   Pneumococcal Polysaccharide-23 11/23/2008   Td 03/23/2004   Tdap 07/12/2015   Pertinent  Health Maintenance Due  Topic Date Due   INFLUENZA VACCINE  07/30/2021   DEXA SCAN  Completed   Fall Risk  09/23/2018 09/19/2017 09/04/2016 08/20/2016 12/20/2014  Falls in the past year? No No No No No  Comment - - Emmi Telephone Survey: data to providers prior to load - -   Functional Status Survey:    Vitals:   09/20/21 1030  BP: 137/81  Pulse: 87  Resp: 20  Temp: 98.4 F (36.9 C)  SpO2: 95%  Weight: 135 lb 12.8 oz (61.6 kg)  Height: 5\' 5"  (1.651 m)   Body mass index is 22.6 kg/m. Physical Exam Vitals reviewed.  Constitutional:      General: She is not in acute distress. HENT:     Head: Normocephalic.     Right Ear: There is no impacted cerumen.     Left Ear: There is no impacted cerumen.     Nose: Nose normal.     Mouth/Throat:     Mouth: Mucous membranes are moist.  Eyes:     General:        Right eye: No discharge.        Left eye: No discharge.     Pupils: Pupils are equal, round, and reactive to light.  Neck:     Vascular: No carotid bruit.  Cardiovascular:     Rate and Rhythm: Normal rate and regular rhythm.     Pulses: Normal pulses.     Heart sounds: Normal heart sounds. No murmur heard. Pulmonary:     Effort: Pulmonary effort is normal. No respiratory distress.     Breath sounds: Normal breath sounds. No wheezing.  Abdominal:     General: Bowel sounds are normal. There is no distension.     Palpations: Abdomen is soft.     Tenderness: There is no abdominal tenderness.  Musculoskeletal:     Cervical back: Normal range of motion.     Right lower leg: No edema.     Left lower leg: No edema.  Lymphadenopathy:     Cervical: No cervical adenopathy.  Skin:    General: Skin is warm and dry.     Capillary Refill: Capillary  refill takes less than 2 seconds.  Neurological:     General: No focal deficit present.     Mental Status: She is alert and oriented to person, place, and time.     Motor: Weakness present.     Gait: Gait abnormal.     Comments: walker  Psychiatric:        Mood and Affect: Mood normal.        Behavior: Behavior normal.    Labs reviewed: Recent Labs    10/12/20 0020 10/12/20 0656 10/25/20 0000 01/12/21 0000 04/11/21 0000 06/15/21 0959  NA 131* 133*   < > 136* 137 139  K 4.0 3.8   < > 4.2 4.3 5.0  CL 98 98   < > 104 103 101  CO2 27 27   < > 26* 26* 25  GLUCOSE 197* 148*  --   --   --  87  BUN 16 12   < > 10 18 16   CREATININE 0.44 0.44   < > 0.4* 0.5 0.50*  CALCIUM 8.8* 8.9   < > 9.1 9.2 9.9   < > = values in this interval not displayed.   Recent Labs    10/10/20 1806 10/25/20 0000 01/12/21 0000 04/11/21 0000  AST 14* 10* 11* 13  ALT 16 10 11 14   ALKPHOS 114 108 103 101  BILITOT 0.6  --   --   --   PROT 6.5  --   --   --   ALBUMIN 4.0 2.7* 3.6 4.0   Recent Labs    10/10/20 1806 10/11/20 0340 10/12/20 0656 10/13/20 0309 10/13/20 0309 10/25/20 0000 10/31/20 0000 01/12/21 0000 04/11/21 0000  WBC 8.9 7.4 12.1* 11.1*   < > 5.8 4.4 5.8 6.5  NEUTROABS 7.4  --   --   --   --   --   --  3,265.00 4,115.00  HGB 13.3 11.5* 10.8* 10.5*  --  8.9* 8.9* 11.2* 12.7  HCT 40.9 34.9* 32.7* 32.5*  --  27* 26* 34* 39  MCV 94.9 92.6 94.2 93.9  --   --   --   --   --   PLT 353 284 287 273  --  375  --  237 268   < > = values in this interval not displayed.   Lab Results  Component Value Date   TSH 1.90 05/11/2020   Lab Results  Component Value Date   HGBA1C 5.5 07/12/2015   Lab Results  Component Value Date   CHOL 227 (H) 06/24/2013   HDL 89.10 06/24/2013   LDLDIRECT 114.7 06/24/2013   TRIG 80.0 06/24/2013   CHOLHDL 3 06/24/2013    Significant Diagnostic Results in last 30 days:  No results found.  Assessment/Plan 1. Essential hypertension - controlled -  cont metoprolol and Norvasc - cbc/diff - cmp  2. Gastroesophageal reflux disease without esophagitis - hgb > 12 03/2021 - cont Prilosec  3. Anxiety and depression - tearful today, received sad news about nephew health - not on medication at this time  4. Age-related osteoporosis without current pathological fracture - DEXA 2021 - not on supplementation  5. Left leg pain - intermittent left knee pain - cont tylenol and Biofreeze prn  6. Slow transit constipation - LBM 09/22 - cont daily colace and miralax  7. Senile purpura (H[facility]) - painless red-purple bruising to left forearm  - education done with patient    Family/ staff Communication: plan discussed with patient and nurse  Labs/tests ordered:  cbc/diff, cmp

## 2021-09-25 DIAGNOSIS — I10 Essential (primary) hypertension: Secondary | ICD-10-CM | POA: Diagnosis not present

## 2021-09-25 LAB — CBC AND DIFFERENTIAL
HCT: 37 (ref 36–46)
Hemoglobin: 12.4 (ref 12.0–16.0)
Neutrophils Absolute: 3723
Platelets: 263 (ref 150–399)
WBC: 5.9

## 2021-09-25 LAB — BASIC METABOLIC PANEL
BUN: 17 (ref 4–21)
CO2: 26 — AB (ref 13–22)
Chloride: 105 (ref 99–108)
Creatinine: 0.6 (ref 0.5–1.1)
Glucose: 84
Potassium: 4.3 (ref 3.4–5.3)
Sodium: 139 (ref 137–147)

## 2021-09-25 LAB — HEPATIC FUNCTION PANEL
ALT: 13 (ref 7–35)
AST: 12 — AB (ref 13–35)
Alkaline Phosphatase: 80 (ref 25–125)
Bilirubin, Total: 0.4

## 2021-09-25 LAB — COMPREHENSIVE METABOLIC PANEL
Albumin: 3.9 (ref 3.5–5.0)
Calcium: 9.5 (ref 8.7–10.7)
Globulin: 1.7

## 2021-09-25 LAB — CBC: RBC: 3.89 (ref 3.87–5.11)

## 2021-11-16 DIAGNOSIS — M1712 Unilateral primary osteoarthritis, left knee: Secondary | ICD-10-CM | POA: Diagnosis not present

## 2021-11-16 DIAGNOSIS — M25562 Pain in left knee: Secondary | ICD-10-CM | POA: Diagnosis not present

## 2021-11-16 DIAGNOSIS — M6281 Muscle weakness (generalized): Secondary | ICD-10-CM | POA: Diagnosis not present

## 2021-11-16 DIAGNOSIS — Z9181 History of falling: Secondary | ICD-10-CM | POA: Diagnosis not present

## 2021-11-19 DIAGNOSIS — M6281 Muscle weakness (generalized): Secondary | ICD-10-CM | POA: Diagnosis not present

## 2021-11-19 DIAGNOSIS — Z9181 History of falling: Secondary | ICD-10-CM | POA: Diagnosis not present

## 2021-11-19 DIAGNOSIS — M1712 Unilateral primary osteoarthritis, left knee: Secondary | ICD-10-CM | POA: Diagnosis not present

## 2021-11-19 DIAGNOSIS — M25562 Pain in left knee: Secondary | ICD-10-CM | POA: Diagnosis not present

## 2021-11-21 DIAGNOSIS — M25562 Pain in left knee: Secondary | ICD-10-CM | POA: Diagnosis not present

## 2021-11-21 DIAGNOSIS — Z9181 History of falling: Secondary | ICD-10-CM | POA: Diagnosis not present

## 2021-11-21 DIAGNOSIS — M1712 Unilateral primary osteoarthritis, left knee: Secondary | ICD-10-CM | POA: Diagnosis not present

## 2021-11-21 DIAGNOSIS — M6281 Muscle weakness (generalized): Secondary | ICD-10-CM | POA: Diagnosis not present

## 2021-11-23 DIAGNOSIS — M6281 Muscle weakness (generalized): Secondary | ICD-10-CM | POA: Diagnosis not present

## 2021-11-23 DIAGNOSIS — M25562 Pain in left knee: Secondary | ICD-10-CM | POA: Diagnosis not present

## 2021-11-23 DIAGNOSIS — M1712 Unilateral primary osteoarthritis, left knee: Secondary | ICD-10-CM | POA: Diagnosis not present

## 2021-11-23 DIAGNOSIS — Z9181 History of falling: Secondary | ICD-10-CM | POA: Diagnosis not present

## 2021-11-26 DIAGNOSIS — M25562 Pain in left knee: Secondary | ICD-10-CM | POA: Diagnosis not present

## 2021-11-26 DIAGNOSIS — M1712 Unilateral primary osteoarthritis, left knee: Secondary | ICD-10-CM | POA: Diagnosis not present

## 2021-11-26 DIAGNOSIS — Z9181 History of falling: Secondary | ICD-10-CM | POA: Diagnosis not present

## 2021-11-26 DIAGNOSIS — M6281 Muscle weakness (generalized): Secondary | ICD-10-CM | POA: Diagnosis not present

## 2021-11-28 DIAGNOSIS — M25562 Pain in left knee: Secondary | ICD-10-CM | POA: Diagnosis not present

## 2021-11-28 DIAGNOSIS — M6281 Muscle weakness (generalized): Secondary | ICD-10-CM | POA: Diagnosis not present

## 2021-11-28 DIAGNOSIS — Z9181 History of falling: Secondary | ICD-10-CM | POA: Diagnosis not present

## 2021-11-28 DIAGNOSIS — M1712 Unilateral primary osteoarthritis, left knee: Secondary | ICD-10-CM | POA: Diagnosis not present

## 2021-11-30 DIAGNOSIS — R296 Repeated falls: Secondary | ICD-10-CM | POA: Diagnosis not present

## 2021-11-30 DIAGNOSIS — M1712 Unilateral primary osteoarthritis, left knee: Secondary | ICD-10-CM | POA: Diagnosis not present

## 2021-11-30 DIAGNOSIS — Z9181 History of falling: Secondary | ICD-10-CM | POA: Diagnosis not present

## 2021-11-30 DIAGNOSIS — M25562 Pain in left knee: Secondary | ICD-10-CM | POA: Diagnosis not present

## 2021-11-30 DIAGNOSIS — R278 Other lack of coordination: Secondary | ICD-10-CM | POA: Diagnosis not present

## 2021-11-30 DIAGNOSIS — M810 Age-related osteoporosis without current pathological fracture: Secondary | ICD-10-CM | POA: Diagnosis not present

## 2021-11-30 DIAGNOSIS — M6281 Muscle weakness (generalized): Secondary | ICD-10-CM | POA: Diagnosis not present

## 2021-12-03 DIAGNOSIS — R296 Repeated falls: Secondary | ICD-10-CM | POA: Diagnosis not present

## 2021-12-03 DIAGNOSIS — M25562 Pain in left knee: Secondary | ICD-10-CM | POA: Diagnosis not present

## 2021-12-03 DIAGNOSIS — R278 Other lack of coordination: Secondary | ICD-10-CM | POA: Diagnosis not present

## 2021-12-03 DIAGNOSIS — M1712 Unilateral primary osteoarthritis, left knee: Secondary | ICD-10-CM | POA: Diagnosis not present

## 2021-12-03 DIAGNOSIS — Z9181 History of falling: Secondary | ICD-10-CM | POA: Diagnosis not present

## 2021-12-03 DIAGNOSIS — M6281 Muscle weakness (generalized): Secondary | ICD-10-CM | POA: Diagnosis not present

## 2021-12-05 DIAGNOSIS — M1712 Unilateral primary osteoarthritis, left knee: Secondary | ICD-10-CM | POA: Diagnosis not present

## 2021-12-05 DIAGNOSIS — M25562 Pain in left knee: Secondary | ICD-10-CM | POA: Diagnosis not present

## 2021-12-05 DIAGNOSIS — Z9181 History of falling: Secondary | ICD-10-CM | POA: Diagnosis not present

## 2021-12-05 DIAGNOSIS — M6281 Muscle weakness (generalized): Secondary | ICD-10-CM | POA: Diagnosis not present

## 2021-12-05 DIAGNOSIS — R278 Other lack of coordination: Secondary | ICD-10-CM | POA: Diagnosis not present

## 2021-12-05 DIAGNOSIS — R296 Repeated falls: Secondary | ICD-10-CM | POA: Diagnosis not present

## 2021-12-07 DIAGNOSIS — R296 Repeated falls: Secondary | ICD-10-CM | POA: Diagnosis not present

## 2021-12-07 DIAGNOSIS — R278 Other lack of coordination: Secondary | ICD-10-CM | POA: Diagnosis not present

## 2021-12-07 DIAGNOSIS — Z9181 History of falling: Secondary | ICD-10-CM | POA: Diagnosis not present

## 2021-12-07 DIAGNOSIS — M25562 Pain in left knee: Secondary | ICD-10-CM | POA: Diagnosis not present

## 2021-12-07 DIAGNOSIS — M6281 Muscle weakness (generalized): Secondary | ICD-10-CM | POA: Diagnosis not present

## 2021-12-07 DIAGNOSIS — M1712 Unilateral primary osteoarthritis, left knee: Secondary | ICD-10-CM | POA: Diagnosis not present

## 2021-12-10 DIAGNOSIS — R296 Repeated falls: Secondary | ICD-10-CM | POA: Diagnosis not present

## 2021-12-10 DIAGNOSIS — Z9181 History of falling: Secondary | ICD-10-CM | POA: Diagnosis not present

## 2021-12-10 DIAGNOSIS — M6281 Muscle weakness (generalized): Secondary | ICD-10-CM | POA: Diagnosis not present

## 2021-12-10 DIAGNOSIS — M1712 Unilateral primary osteoarthritis, left knee: Secondary | ICD-10-CM | POA: Diagnosis not present

## 2021-12-10 DIAGNOSIS — R278 Other lack of coordination: Secondary | ICD-10-CM | POA: Diagnosis not present

## 2021-12-10 DIAGNOSIS — M25562 Pain in left knee: Secondary | ICD-10-CM | POA: Diagnosis not present

## 2021-12-10 NOTE — Progress Notes (Deleted)
HPI:FU CHF and possible amyloidosis. Pt previously admitted with CHF symptoms following prior admission for repair of right hip fracture. CTA showed no pulmonary embolus, small bilateral pleural effusions, pulmonary edema and three-vessel coronary calcification. Echocardiogram 10/20 showed normal LV function, moderate left ventricular hypertrophy, grade 2 diastolic dysfunction, severe left atrial enlargement; consider amyloidosis.  At previous office visit patient requested no further evaluation for amyloid which I felt was appropriate given her age. Had repair of left femoral neck fracture October 2021. Since last seen   Current Outpatient Medications  Medication Sig Dispense Refill   acetaminophen (TYLENOL) 325 MG tablet Take 650 mg by mouth in the morning and at bedtime. Not to exceed 3000 mg per 24 hours.     acetaminophen (TYLENOL) 500 MG tablet Take 1,000 mg by mouth every 6 (six) hours as needed for moderate pain.     amLODipine (NORVASC) 10 MG tablet Take 1 tablet (10 mg total) by mouth daily. 180 tablet 3   aspirin EC 81 MG tablet Take 81 mg by mouth daily. Swallow whole.     docusate sodium (COLACE) 100 MG capsule Take 1 capsule (100 mg total) by mouth 2 (two) times daily as needed (for constipation). 30 capsule 0   fexofenadine (ALLEGRA) 180 MG tablet Take 180 mg by mouth daily as needed for allergies or rhinitis.      furosemide (LASIX) 40 MG tablet Take 1 tablet (40 mg total) by mouth daily. 90 tablet 1   Menthol, Topical Analgesic, (BIOFREEZE) 4 % GEL Apply 1 application topically 2 (two) times daily as needed (left knee pain).      metoprolol succinate (TOPROL-XL) 25 MG 24 hr tablet TAKE 1.5 TABLETS (37.5 MG TOTAL) BY MOUTH DAILY. 90 tablet 2   Multiple Vitamins-Minerals (MULTIVITAMIN ADULT) CHEW Chew 400 mg by mouth daily.     omeprazole (PRILOSEC) 20 MG capsule Take 20 mg by mouth daily.     polyethylene glycol (MIRALAX / GLYCOLAX) 17 g packet Take 17 g by mouth daily.      potassium chloride SA (KLOR-CON) 20 MEQ tablet Take 20 mEq by mouth daily.     No current facility-administered medications for this visit.     Past Medical History:  Diagnosis Date   Aortic heart murmur    AS/AI on 2D ECHO ; SBE prophylaxis Rxed   Chest pain 2007 ; 2011   negative cardiac assessment   GERD (gastroesophageal reflux disease)    Hyperlipidemia    Hypertension [   Interstitial cystitis    Resolved   Ocular migraine 04/10/2012   Osteoporosis    T score - 3.0 @ femoral neck; Fosamax affected swallowing   Peripheral vascular disease ([place])    Postmenopausal    No PMH or HRT    Past Surgical History:  Procedure Laterality Date   ANTERIOR APPROACH HEMI HIP ARTHROPLASTY Right 09/03/2019   Procedure: ANTERIOR APPROACH HEMI HIP ARTHROPLASTY BIPOLAR;  Surgeon: [name], MD;  Location: [facility];  Service: Orthopedics;  Laterality: Right;   COLONOSCOPY  2011   Dr Sharlett Iles   HIP ARTHROPLASTY Left 10/11/2020   Procedure: ARTHROPLASTY BIPOLAR HIP (HEMIARTHROPLASTY) ANTERIOR APPROACH;  Surgeon: [name], MD;  Location: [facility];  Service: Orthopedics;  Laterality: Left;    Social History   Socioeconomic History   Marital status: Widowed    Spouse name: Not on file   Number of children: 1   Years of education: Not on file   Highest education level:  Not on file  Occupational History   Not on file  Tobacco Use   Smoking status: Never   Smokeless tobacco: Never  Substance and Sexual Activity   Alcohol use: No   Drug use: No   Sexual activity: Not Currently    Birth control/protection: None  Other Topics Concern   Not on file  Social History Narrative   Not on file   Social Determinants of Health   Financial Resource Strain: Not on file  Food Insecurity: Not on file  Transportation Needs: Not on file  Physical Activity: Not on file  Stress: Not on file  Social Connections: Not on file  Intimate Partner Violence: Not on file    Family History  Problem  Relation Age of Onset   Diabetes Father        Pancreatitic insufficiency post MVA   Hypertension Mother    Osteoporosis Mother    Heart disease Mother        AF   Thyroid disease Sister        hypothyroidism   Coronary artery disease Sister        AF   Stroke Sister 96       also pulmonary disease    ROS: no fevers or chills, productive cough, hemoptysis, dysphasia, odynophagia, melena, hematochezia, dysuria, hematuria, rash, seizure activity, orthopnea, PND, pedal edema, claudication. Remaining systems are negative.  Physical Exam: Well-developed well-nourished in no acute distress.  Skin is warm and dry.  HEENT is normal.  Neck is supple.  Chest is clear to auscultation with normal expansion.  Cardiovascular exam is regular rate and rhythm.  Abdominal exam nontender or distended. No masses palpated. Extremities show no edema. neuro grossly intact  ECG- personally reviewed  A/P  1 chronic diastolic congestive heart failure-she is euvolemic.  We will continue diuretic at present dose.  Check potassium and renal function.  2 coronary artery disease-based on previous CT scan showing coronary calcification.  We will continue with aspirin.  3 hypertension-blood pressure controlled.  Continue present medications.  4 palpitations-symptoms have improved.  Can consider beta-blockade in the future if necessary.  5 history of question amyloid-given patient's age she only wanted conservative measures with no aggressive evaluation.  I think this is appropriate.  Kirk Ruths, MD

## 2021-12-11 ENCOUNTER — Encounter: Payer: Self-pay | Admitting: Nurse Practitioner

## 2021-12-11 ENCOUNTER — Non-Acute Institutional Stay (INDEPENDENT_AMBULATORY_CARE_PROVIDER_SITE_OTHER): Payer: MEDICARE | Admitting: Nurse Practitioner

## 2021-12-11 DIAGNOSIS — R296 Repeated falls: Secondary | ICD-10-CM | POA: Diagnosis not present

## 2021-12-11 DIAGNOSIS — Z9181 History of falling: Secondary | ICD-10-CM | POA: Diagnosis not present

## 2021-12-11 DIAGNOSIS — M25562 Pain in left knee: Secondary | ICD-10-CM | POA: Diagnosis not present

## 2021-12-11 DIAGNOSIS — M1712 Unilateral primary osteoarthritis, left knee: Secondary | ICD-10-CM | POA: Diagnosis not present

## 2021-12-11 DIAGNOSIS — R278 Other lack of coordination: Secondary | ICD-10-CM | POA: Diagnosis not present

## 2021-12-11 DIAGNOSIS — Z0000 Encounter for general adult medical examination without abnormal findings: Secondary | ICD-10-CM | POA: Diagnosis not present

## 2021-12-11 DIAGNOSIS — M6281 Muscle weakness (generalized): Secondary | ICD-10-CM | POA: Diagnosis not present

## 2021-12-12 DIAGNOSIS — R278 Other lack of coordination: Secondary | ICD-10-CM | POA: Diagnosis not present

## 2021-12-12 DIAGNOSIS — R296 Repeated falls: Secondary | ICD-10-CM | POA: Diagnosis not present

## 2021-12-12 DIAGNOSIS — M1712 Unilateral primary osteoarthritis, left knee: Secondary | ICD-10-CM | POA: Diagnosis not present

## 2021-12-12 DIAGNOSIS — M6281 Muscle weakness (generalized): Secondary | ICD-10-CM | POA: Diagnosis not present

## 2021-12-12 DIAGNOSIS — M25562 Pain in left knee: Secondary | ICD-10-CM | POA: Diagnosis not present

## 2021-12-12 DIAGNOSIS — Z9181 History of falling: Secondary | ICD-10-CM | POA: Diagnosis not present

## 2021-12-13 DIAGNOSIS — R296 Repeated falls: Secondary | ICD-10-CM | POA: Diagnosis not present

## 2021-12-13 DIAGNOSIS — Z9181 History of falling: Secondary | ICD-10-CM | POA: Diagnosis not present

## 2021-12-13 DIAGNOSIS — R278 Other lack of coordination: Secondary | ICD-10-CM | POA: Diagnosis not present

## 2021-12-13 DIAGNOSIS — M25562 Pain in left knee: Secondary | ICD-10-CM | POA: Diagnosis not present

## 2021-12-13 DIAGNOSIS — M1712 Unilateral primary osteoarthritis, left knee: Secondary | ICD-10-CM | POA: Diagnosis not present

## 2021-12-13 DIAGNOSIS — M6281 Muscle weakness (generalized): Secondary | ICD-10-CM | POA: Diagnosis not present

## 2021-12-14 ENCOUNTER — Ambulatory Visit: Payer: PRIVATE HEALTH INSURANCE | Admitting: Cardiology

## 2021-12-14 ENCOUNTER — Encounter: Payer: Self-pay | Admitting: Nurse Practitioner

## 2021-12-14 ENCOUNTER — Non-Acute Institutional Stay: Payer: MEDICARE | Admitting: Nurse Practitioner

## 2021-12-14 DIAGNOSIS — I10 Essential (primary) hypertension: Secondary | ICD-10-CM

## 2021-12-14 DIAGNOSIS — I872 Venous insufficiency (chronic) (peripheral): Secondary | ICD-10-CM | POA: Diagnosis not present

## 2021-12-14 DIAGNOSIS — F419 Anxiety disorder, unspecified: Secondary | ICD-10-CM

## 2021-12-14 DIAGNOSIS — K5901 Slow transit constipation: Secondary | ICD-10-CM

## 2021-12-14 DIAGNOSIS — M1712 Unilateral primary osteoarthritis, left knee: Secondary | ICD-10-CM

## 2021-12-14 DIAGNOSIS — F32A Depression, unspecified: Secondary | ICD-10-CM

## 2021-12-14 DIAGNOSIS — J111 Influenza due to unidentified influenza virus with other respiratory manifestations: Secondary | ICD-10-CM | POA: Insufficient documentation

## 2021-12-14 DIAGNOSIS — K219 Gastro-esophageal reflux disease without esophagitis: Secondary | ICD-10-CM

## 2021-12-14 NOTE — Assessment & Plan Note (Signed)
Stable, continue Omeprazole.  

## 2021-12-14 NOTE — Progress Notes (Addendum)
Location:   [facility] [place] Room Number: 655 A Place of Service:  [facility] (13) Provider: [facility] Tajon Moring NP  [name], MD  Patient Care Team: [name], MD as PCP - General (Internal Medicine) [name], MD as Consulting Physician (Orthopedic Surgery)  Extended Emergency Contact Information Primary Emergency Contact: [name] Address: [address]          [place] [address] [name] of [place] Phone: [phone] Relation: Relative Secondary Emergency Contact: Rocky Crafts Address: [address]          [place], [place] [address] [name] of [place] Phone: [phone] Mobile Phone: [phone] Relation: Relative  Code Status: DNR Goals of care: Advanced Directive information Advanced Directives 12/14/2021  Does Patient Have a Medical Advance Directive? Yes  Type of Paramedic of Post;Living will;Out of facility DNR (pink MOST or yellow form)  Does patient want to make changes to medical advance directive? No - Patient declined  Copy of [place] in Chart? Yes - validated most recent copy scanned in chart (See row information)  Would patient like information on creating a medical advance directive? -  Pre-existing out of facility DNR order (yellow form or pink MOST form) -     Chief Complaint  Patient presents with   Acute Visit    Acute visit for aching, Positive for Flu.    HPI:  Pt is a 87 y.o. female seen today for an acute visit for aching, malaise, congestion, fever, tested positive Flu, decreased appetite and oral intake    Anemia, Hgb 12.4 09/25/21             S/p left bipolar hemiarthroplasty 10/12/20 for left hip fracture, healed. left leg/knee sometimes, takes Tylenol             HTN, blood pressure is controlled, on amlodipine Metoprolol, Bun/creat 17/0.6 09/25/21             CHF/PVD, peripheral edema is not apparent today, on Furosemide 47m qd.  Cardiology f/u             GERD, stable, on Omeprazole.              Depression/anxiety, lifelong issue, but sleeps/eats at her baseline.              Constipation, takes MiraLax               Past Medical History:  Diagnosis Date   Aortic heart murmur    AS/AI on 2D ECHO ; SBE prophylaxis Rxed   Chest pain 2007 ; 2011   negative cardiac assessment   GERD (gastroesophageal reflux disease)    Hyperlipidemia    Hypertension [   Interstitial cystitis    Resolved   Ocular migraine 04/10/2012   Osteoporosis    T score - 3.0 @ femoral neck; Fosamax affected swallowing   [place] vascular disease [place]    Postmenopausal    No PMH or HRT   Past Surgical History:  Procedure Laterality Date   ANTERIOR APPROACH HEMI HIP ARTHROPLASTY Right 09/03/2019   Procedure: ANTERIOR APPROACH HEMI HIP ARTHROPLASTY BIPOLAR;  Surgeon: GDorna Leitz MD;  Location: [facility];  Service: Orthopedics;  Laterality: Right;   COLONOSCOPY  2011   Dr PSharlett Iles  HIP ARTHROPLASTY Left 10/11/2020   Procedure: ARTHROPLASTY BIPOLAR HIP (HEMIARTHROPLASTY) ANTERIOR APPROACH;  Surgeon: GDorna Leitz MD;  Location: [facility];  Service: Orthopedics;  Laterality: Left;    Allergies  Allergen Reactions   Amitid [Amitriptyline Hcl]     Patient experienced a dizzy, spinning feeling    Metronidazole     REACTION: throat swells   Penicillins     REACTION: rash & fever   Alendronate Sodium     REACTION: unspecified   Amoxicillin Nausea And Vomiting   Clarithromycin     GI intolerance   Flonase [Fluticasone Propionate]     Slight Headache    Promethazine Hcl     REACTION: unspecified   Robitussin (Alcohol Free) [Guaifenesin] Other (See Comments)    unknown hyperactive   Spironolactone     09/21/13 weakness in legs   Singulair [Montelukast Sodium]     "spacey"; ineffective    Allergies as of 12/14/2021       Reactions   Amitid [amitriptyline Hcl]    Patient experienced a dizzy, spinning  feeling    Metronidazole    REACTION: throat swells   Penicillins    REACTION: rash & fever   Alendronate Sodium    REACTION: unspecified   Amoxicillin Nausea And Vomiting   Clarithromycin    GI intolerance   Flonase [fluticasone Propionate]    Slight Headache    Promethazine Hcl    REACTION: unspecified   Robitussin (alcohol Free) [guaifenesin] Other (See Comments)   unknown hyperactive   Spironolactone    09/21/13 weakness in legs   Singulair [montelukast Sodium]    "spacey"; ineffective        Medication List        Accurate as of December 14, 2021 11:59 PM. If you have any questions, ask your nurse or doctor.          acetaminophen 500 MG tablet Commonly known as: TYLENOL Take 500 mg by mouth every 8 (eight) hours as needed for moderate pain.   acetaminophen 325 MG tablet Commonly known as: TYLENOL Take 650 mg by mouth in the morning and at bedtime. Not to exceed 3000 mg per 24 hours.   amLODipine 10 MG tablet Commonly known as: NORVASC Take 1 tablet (10 mg total) by mouth daily.   aspirin EC 81 MG tablet Take 81 mg by mouth daily. Swallow whole.   Biofreeze 4 % Gel Generic drug: Menthol (Topical Analgesic) Apply 1 application topically 2 (two) times daily as needed (left knee pain).   docusate sodium 100 MG capsule Commonly known as: COLACE Take 1 capsule (100 mg total) by mouth 2 (two) times daily as needed (for constipation).   fexofenadine 180 MG tablet Commonly known as: ALLEGRA Take 180 mg by mouth daily as needed for allergies or rhinitis.   furosemide 40 MG tablet Commonly known as: LASIX Take 1 tablet (40 mg total) by mouth daily.   metoprolol succinate 25 MG 24 hr tablet Commonly known as: TOPROL-XL TAKE 1.5 TABLETS (37.5 MG TOTAL) BY MOUTH DAILY.   Multivitamin Adult Chew Chew 400 mg by mouth daily.   omeprazole 20 MG capsule Commonly known as: PRILOSEC Take 20 mg by mouth daily.   oseltamivir 75 MG capsule Commonly known as:  TAMIFLU Take 75 mg by mouth 2 (two) times daily.   polyethylene glycol 17 g packet Commonly known as: MIRALAX / GLYCOLAX Take 17 g by mouth daily.   potassium chloride SA 20 MEQ tablet Commonly known as: KLOR-CON M Take 20 mEq by mouth daily.        Review of Systems  Constitutional:  Positive for activity change, appetite change and fatigue. Negative for fever.       Aches, malaise  HENT:  Positive for congestion and hearing loss. Negative for trouble swallowing.   Eyes:  Negative for visual disturbance.  Respiratory:  Negative for cough, shortness of breath and wheezing.   Cardiovascular:  Negative for leg swelling.  Gastrointestinal:  Negative for abdominal pain and constipation.       GERD  Genitourinary:  Negative for dysuria, hematuria and urgency.       Increased nocturnal urinary frequency, 6x/last night. .   Musculoskeletal:  Positive for arthralgias and gait problem.       R hip pain is chronic, surgery about a year ago. Left hip pain, s/p hemiarthroplasty, WBAT  Skin:  Negative for color change.       Right occiput hematoma.   Neurological:  Negative for speech difficulty, weakness and light-headedness.  Psychiatric/Behavioral:  Negative for behavioral problems and sleep disturbance. The patient is not nervous/anxious.    Immunization History  Administered Date(s) Administered   Fluad Quad(high Dose 65+) 10/05/2019   Influenza Split 12/10/2011, 09/29/2012   Influenza, High Dose Seasonal PF 09/21/2014, 08/27/2016, 08/27/2017, 09/23/2018   Influenza,inj,Quad PF,6+ Mos 10/08/2013, 09/12/2015   Influenza-Unspecified 10/27/2020, 10/18/2021   Moderna Sars-Covid-2 Vaccination 11/07/2020   PFIZER(Purple Top)SARS-COV-2 Vaccination 02/28/2020, 03/29/2020, 11/07/2020, 05/29/2021, 09/18/2021   Pneumococcal Conjugate-13 08/22/2015   Pneumococcal Polysaccharide-23 11/23/2008   Td 03/23/2004   Tdap 07/12/2015   Pertinent  Health Maintenance Due  Topic Date Due    INFLUENZA VACCINE  Completed   DEXA SCAN  Completed   Fall Risk 10/11/2020 10/12/2020 10/13/2020 10/13/2020 04/08/2021  Falls in the past year? - - - - -  Patient Fall Risk Level _0    Functional Status Survey:    Vitals:   12/14/21 1150  BP: (!) 158/86  Pulse: 82  Resp: 18  Temp: 100.1 F (37.8 C)  SpO2: 95%  Weight: 139 lb (63 kg)  Height: _1  (1.626 m)   Body mass index is 23.86 kg/m. Physical Exam Vitals and nursing note reviewed.  Constitutional:      Comments: Generalized weakness  HENT:     Head: Normocephalic and atraumatic.     Nose: Congestion and rhinorrhea present.     Mouth/Throat:     Mouth: Mucous membranes are dry.     Pharynx: No oropharyngeal exudate or posterior oropharyngeal erythema.  Eyes:     Extraocular Movements: Extraocular movements intact.     Conjunctiva/sclera: Conjunctivae normal.     Pupils: Pupils are equal, round, and reactive to light.  Cardiovascular:     Rate and Rhythm: Normal rate.     Heart sounds: No murmur heard.    Comments: HR 100 bpm Pulmonary:     Effort: Pulmonary effort is normal.     Breath sounds: No wheezing, rhonchi or rales.  Abdominal:     General: Bowel sounds are normal.     Palpations: Abdomen is soft.     Tenderness: There is no abdominal tenderness.  Musculoskeletal:     Cervical back: Normal range of motion and neck supple.     Right lower leg: No edema.     Left lower leg: No edema.  Skin:    General: Skin is warm and dry.     Findings: No bruising.  Neurological:     General: No focal deficit present.  Mental Status: She is alert and oriented to person, place, and time. Mental status is at baseline.     Motor: No weakness.     Coordination: Coordination normal.     Gait: Gait abnormal.  Psychiatric:        Mood and Affect: Mood normal.        Behavior: Behavior normal.        Thought Content: Thought content  normal.        Judgment: Judgment normal.     Comments: Admitted feeling depressed or sad at times, but declined antidepressant, stated she sleeps well, has supportive family, is happy at [facility].     Labs reviewed: Recent Labs    04/11/21 0000 06/15/21 0959 09/25/21 0000  NA 137 139 139  K 4.3 5.0 4.3  CL 103 101 105  CO2 26* 25 26*  GLUCOSE  --  87  --   BUN _0 CREATININE 0.5 0.50* 0.6  CALCIUM 9.2 9.9 9.5   Recent Labs    01/12/21 0000 04/11/21 0000 09/25/21 0000  AST 11* 13 12*  ALT _1 ALKPHOS 103 101 80  ALBUMIN 3.6 4.0 3.9   Recent Labs    01/12/21 0000 04/11/21 0000 09/25/21 0000  WBC 5.8 6.5 5.9  NEUTROABS 3,265.00 4,115.00 3,723.00  HGB 11.2* 12.7 12.4  HCT 34* 39 37  PLT 237 268 263   Lab Results  Component Value Date   TSH 1.90 05/11/2020   Lab Results  Component Value Date   HGBA1C 5.5 07/12/2015   Lab Results  Component Value Date   CHOL 227 (H) 06/24/2013   HDL 89.10 06/24/2013   LDLDIRECT 114.7 06/24/2013   TRIG 80.0 06/24/2013   CHOLHDL 3 06/24/2013    Significant Diagnostic Results in last 30 days:  No results found.  Assessment/Plan: Influenza aching, malaise, congestion, fever, tested positive Flu, decreased appetite and oral intake. Will increase Tylenol 641m tid with meals x 3 days, Mucinex 6062mbid x5 days, Hold Furosemide/Kcl x3 days, update CBC/diff, CMP/eGFR.  12/14/21 wbc 5.0, Hgb 11.3, plt 195, neutrophils 73, Na 138, K 3.7, Bun 15, creat 0.46, eGFR 91  Esophageal reflux Stable, continue Omeprazole.   Slow transit constipation Stable, continue MiraLax.   Degenerative arthritis of left knee S/p left bipolar hemiarthroplasty 10/12/20 for left hip fracture, healed. left leg/knee sometimes, takes Tylenol  Essential hypertension Sbp is in 150s, on amlodipine Metoprolol, observe  Venous (peripheral) insufficiency  peripheral edema is not apparent today, hold Furosemide 4045md x72 2/2 decreased  oral intake.  Cardiology f/u  Anxiety and depression  lifelong issue, but sleeps/eats at her baseline.     Family/ staff Communication: plan of care reviewed with the patient and charge nurse.   Labs/tests ordered:  CBC/diff, CMP/eGFR  Time spend 40 minutes.

## 2021-12-14 NOTE — Assessment & Plan Note (Signed)
lifelong issue, but sleeps/eats at her baseline.  

## 2021-12-14 NOTE — Assessment & Plan Note (Signed)
Stable, continue MiraLax.  °

## 2021-12-14 NOTE — Progress Notes (Addendum)
Subjective:   Alicia Boyd is a 87 y.o. female who presents for Medicare Annual (Subsequent) preventive examination at [facility].     Objective:    Today's Vitals   12/11/21 1646 12/14/21 1555  BP: (!) 158/86   Pulse: 82   Resp: 16   Temp: (!) 97.3 F (36.3 C)   SpO2: 95%   Weight: 139 lb (63 kg)   Height: 5\' 4"  (1.626 m)   PainSc:  2    Body mass index is 23.86 kg/m.  Advanced Directives 12/14/2021 12/11/2021 09/20/2021 05/25/2021 04/09/2021 04/08/2021 11/07/2020  Does Patient Have a Medical Advance Directive? Yes Yes Yes Yes Yes Yes Yes  Type of Paramedic of [place];Living will;Out of facility DNR (pink MOST or yellow form) [place];Living will;Out of facility DNR (pink MOST or yellow form) [place];Living will [place];Out of facility DNR (pink MOST or yellow form) [place];Out of facility DNR (pink MOST or yellow form) Out of facility DNR (pink MOST or yellow form) [place];Living will;Out of facility DNR (pink MOST or yellow form)  Does patient want to make changes to medical advance directive? No - Patient declined No - Patient declined No - Patient declined No - Patient declined No - Patient declined - No - Patient declined  Copy of [place] in Chart? Yes - validated most recent copy scanned in chart (See row information) Yes - validated most recent copy scanned in chart (See row information) Yes - validated most recent copy scanned in chart (See row information) Yes - validated most recent copy scanned in chart (See row information) Yes - validated most recent copy scanned in chart (See row information) - Yes - validated most recent copy scanned in chart (See row information)  Would patient like information on creating a medical advance directive? - - - - - No - Patient declined -  Pre-existing out of  facility DNR order (yellow form or pink MOST form) - - - Yellow form placed in chart (order not valid for inpatient use);Pink MOST form placed in chart (order not valid for inpatient use) Yellow form placed in chart (order not valid for inpatient use);Pink MOST form placed in chart (order not valid for inpatient use) Pink MOST form placed in chart (order not valid for inpatient use) Pink MOST form placed in chart (order not valid for inpatient use);Yellow form placed in chart (order not valid for inpatient use)    Current Medications (verified) Outpatient Encounter Medications as of 12/11/2021  Medication Sig   acetaminophen (TYLENOL) 325 MG tablet Take 650 mg by mouth in the morning and at bedtime. Not to exceed 3000 mg per 24 hours.   acetaminophen (TYLENOL) 500 MG tablet Take 500 mg by mouth every 8 (eight) hours as needed for moderate pain.   amLODipine (NORVASC) 10 MG tablet Take 1 tablet (10 mg total) by mouth daily.   aspirin EC 81 MG tablet Take 81 mg by mouth daily. Swallow whole.   docusate sodium (COLACE) 100 MG capsule Take 1 capsule (100 mg total) by mouth 2 (two) times daily as needed (for constipation).   fexofenadine (ALLEGRA) 180 MG tablet Take 180 mg by mouth daily as needed for allergies or rhinitis.    furosemide (LASIX) 40 MG tablet Take 1 tablet (40 mg total) by mouth daily.   Menthol, Topical Analgesic, (BIOFREEZE) 4 % GEL Apply 1 application  topically 2 (two) times daily as needed (left knee pain).    metoprolol succinate (TOPROL-XL) 25 MG 24 hr tablet TAKE 1.5 TABLETS (37.5 MG TOTAL) BY MOUTH DAILY.   Multiple Vitamins-Minerals (MULTIVITAMIN ADULT) CHEW Chew 400 mg by mouth daily.   omeprazole (PRILOSEC) 20 MG capsule Take 20 mg by mouth daily.   polyethylene glycol (MIRALAX / GLYCOLAX) 17 g packet Take 17 g by mouth daily.   potassium chloride SA (KLOR-CON) 20 MEQ tablet Take 20 mEq by mouth daily.   No facility-administered encounter medications on file as of  12/11/2021.    Allergies (verified) Amitid [amitriptyline hcl], Metronidazole, Penicillins, Alendronate sodium, Amoxicillin, Clarithromycin, Flonase [fluticasone propionate], Promethazine hcl, Robitussin (alcohol free) [guaifenesin], Spironolactone, and Singulair [montelukast sodium]   History: Past Medical History:  Diagnosis Date   Aortic heart murmur    AS/AI on 2D ECHO ; SBE prophylaxis Rxed   Chest pain 2007 ; 2011   negative cardiac assessment   GERD (gastroesophageal reflux disease)    Hyperlipidemia    Hypertension [   Interstitial cystitis    Resolved   Ocular migraine 04/10/2012   Osteoporosis    T score - 3.0 @ femoral neck; Fosamax affected swallowing   Peripheral vascular disease ([place])    Postmenopausal    No PMH or HRT   Past Surgical History:  Procedure Laterality Date   ANTERIOR APPROACH HEMI HIP ARTHROPLASTY Right 09/03/2019   Procedure: ANTERIOR APPROACH HEMI HIP ARTHROPLASTY BIPOLAR;  Surgeon: [name], MD;  Location: [facility];  Service: Orthopedics;  Laterality: Right;   COLONOSCOPY  2011   Dr Sharlett Iles   HIP ARTHROPLASTY Left 10/11/2020   Procedure: ARTHROPLASTY BIPOLAR HIP (HEMIARTHROPLASTY) ANTERIOR APPROACH;  Surgeon: [name], MD;  Location: [facility];  Service: Orthopedics;  Laterality: Left;   Family History  Problem Relation Age of Onset   Diabetes Father        Pancreatitic insufficiency post MVA   Hypertension Mother    Osteoporosis Mother    Heart disease Mother        AF   Thyroid disease Sister        hypothyroidism   Coronary artery disease Sister        AF   Stroke Sister 10       also pulmonary disease   Social History   Socioeconomic History   Marital status: Widowed    Spouse name: Not on file   Number of children: 1   Years of education: Not on file   Highest education level: Not on file  Occupational History   Not on file  Tobacco Use   Smoking status: Never   Smokeless tobacco: Never  Substance and Sexual Activity    Alcohol use: No   Drug use: No   Sexual activity: Not Currently    Birth control/protection: None  Other Topics Concern   Not on file  Social History Narrative   Not on file   Social Determinants of Health   Financial Resource Strain: Not on file  Food Insecurity: Not on file  Transportation Needs: Not on file  Physical Activity: Not on file  Stress: Not on file  Social Connections: Not on file    Tobacco Counseling Counseling given: Not Answered   Clinical Intake:  Pre-visit preparation completed: Yes  Pain : 0-10 Pain Score: 2  Pain Location: Knee Pain Orientation: Left Pain Descriptors / Indicators: Aching, Discomfort, Dull, Nagging Pain Onset: More than a month ago Pain Frequency: Several days a  week Pain Relieving Factors: Tylenol, rest Effect of Pain on Daily Activities: limited walking.  Pain Relieving Factors: Tylenol, rest  BMI - recorded: 23.86 Nutritional Risks: None Diabetes: No  How often do you need to have someone help you when you read instructions, pamphlets, or other written materials from your doctor or pharmacy?: 1 - Never What is the last grade level you completed in school?: college  Diabetic?no  Interpreter Needed?: No  Information entered by :: Jayren Cease Bretta Bang NP   Activities of Daily Living In your present state of health, do you have any difficulty performing the following activities: 12/14/2021  Hearing? N  Vision? N  Difficulty concentrating or making decisions? N  Walking or climbing stairs? Y  Dressing or bathing? Y  Doing errands, shopping? Y  Preparing Food and eating ? N  Using the Toilet? N  In the past six months, have you accidently leaked urine? Y  Do you have problems with loss of bowel control? N  Managing your Medications? Y  Managing your Finances? Y  Housekeeping or managing your Housekeeping? Y  Some recent data might be hidden    Patient Care Team: [name], MD as PCP - General (Internal  Medicine) [name], MD as Consulting Physician (Orthopedic Surgery)  Indicate any recent Medical Services you may have received from other than [facility] providers in the past year (date may be approximate).     Assessment:   This is a routine wellness examination for [facility]  Hearing/Vision screen No results found.  Dietary issues and exercise activities discussed: Current Exercise Habits: The patient does not participate in regular exercise at present, Exercise limited by: cardiac condition(s);orthopedic condition(s)   Goals Addressed             This Visit's Progress    Maintain Mobility and Function       Evidence-based guidance:  Emphasize the importance of physical activity and aerobic exercise as included in treatment plan; assess barriers to adherence; consider patient's abilities and preferences.  Encourage gradual increase in activity or exercise instead of stopping if pain occurs.  Reinforce individual therapy exercise prescription, such as strengthening, stabilization and stretching programs.  Promote optimal body mechanics to stabilize the spine with lifting and functional activity.  Encourage activity and mobility modifications to facilitate optimal function, such as using a log roll for bed mobility or dressing from a seated position.  Reinforce individual adaptive equipment recommendations to limit excessive spinal movements, such as a Systems analyst.  Assess adequacy of sleep; encourage use of sleep hygiene techniques, such as bedtime routine; use of white noise; dark, cool bedroom; avoiding daytime naps, heavy meals or exercise before bedtime.  Promote positions and modification to optimize sleep and sexual activity; consider pillows or positioning devices to assist in maintaining neutral spine.  Explore options for applying ergonomic principles at work and home, such as frequent position changes, using ergonomically designed equipment and working at optimal  height.  Promote modifications to increase comfort with driving such as lumbar support, optimizing seat and steering wheel position, using cruise control and taking frequent rest stops to stretch and walk.   Notes:        Depression Screen PHQ 2/9 Scores 11/03/2019 09/23/2018 09/19/2017 08/20/2016 12/20/2014 06/24/2013  PHQ - 2 Score 0 0 0 0 0 0  PHQ- 9 Score 0 - - - - -    Fall Risk Fall Risk  09/23/2018 09/19/2017 09/04/2016 08/20/2016 12/20/2014  Falls in the past year? No  No No No No  Comment - - Emmi Telephone Survey: data to providers prior to load - -    FALL RISK PREVENTION PERTAINING TO THE HOME:  Any stairs in or around the home? Yes  If so, are there any without handrails? No  Home free of loose throw rugs in walkways, pet beds, electrical cords, etc? Yes  Adequate lighting in your home to reduce risk of falls? Yes   ASSISTIVE DEVICES UTILIZED TO PREVENT FALLS:  Life alert? No  Use of a cane, walker or w/c? Yes  Grab bars in the bathroom? Yes  Shower chair or bench in shower? Yes  Elevated toilet seat or a handicapped toilet? Yes   TIMED UP AND GO:  Was the test performed? Yes .  Length of time to ambulate 10 feet: 12 sec.   Gait steady and fast with assistive device  Cognitive Function: MMSE - Mini Mental State Exam 12/27/2021 09/23/2018 09/19/2017 08/20/2016  Not completed: - (No Data) (No Data) (No Data)  Orientation to time 5 - - -  Orientation to Place 5 - - -  Registration 3 - - -  Attention/ Calculation 5 - - -  Recall 3 - - -  Language- name 2 objects 2 - - -  Language- repeat 1 - - -  Language- follow 3 step command 3 - - -  Language- read & follow direction 1 - - -  Write a sentence 1 - - -  Copy design 1 - - -  Total score 30 - - -        Immunizations Immunization History  Administered Date(s) Administered   Fluad Quad(high Dose 65+) 10/05/2019   Influenza Split 12/10/2011, 09/29/2012   Influenza, High Dose Seasonal PF 09/21/2014, 08/27/2016,  08/27/2017, 09/23/2018   Influenza,inj,Quad PF,6+ Mos 10/08/2013, 09/12/2015   Influenza-Unspecified 10/27/2020, 10/18/2021   Moderna Sars-Covid-2 Vaccination 11/07/2020   PFIZER(Purple Top)SARS-COV-2 Vaccination 02/28/2020, 03/29/2020, 11/07/2020, 05/29/2021, 09/18/2021   Pneumococcal Conjugate-13 08/22/2015   Pneumococcal Polysaccharide-23 11/23/2008   Td 03/23/2004   Tdap 07/12/2015    TDAP status: Up to date  Flu Vaccine status: Up to date  Pneumococcal vaccine status: Up to date  Covid-19 vaccine status: Completed vaccines  Qualifies for Shingles Vaccine? Yes   Zostavax completed No   Shingrix Completed?: No.    Education has been provided regarding the importance of this vaccine. Patient has been advised to call insurance company to determine out of pocket expense if they have not yet received this vaccine. Advised may also receive vaccine at local pharmacy or Health Dept. Verbalized acceptance and understanding.  Screening Tests Health Maintenance  Topic Date Due   Zoster Vaccines- Shingrix (1 of 2) Never done   TETANUS/TDAP  07/11/2025   Pneumonia Vaccine 71+ Years old  Completed   INFLUENZA VACCINE  Completed   DEXA SCAN  Completed   COVID-19 Vaccine  Completed   HPV VACCINES  Aged Out    Health Maintenance  Health Maintenance Due  Topic Date Due   Zoster Vaccines- Shingrix (1 of 2) Never done    Colorectal cancer screening: No longer required.   Mammogram status: No longer required due to aged out.  Bone Density status: Completed 2021. Results reflect: Bone density results: OSTEOPOROSIS. Repeat every 2-3 years.  Lung Cancer Screening: (Low Dose CT Chest recommended if Age 58-80 years, 30 pack-year currently smoking OR have quit w/in 15years.) does not qualify.    Hepatitis C Screening: does not qualify  Vision Screening:  Recommended annual ophthalmology exams for early detection of glaucoma and other disorders of the eye. Is the patient up to date with  their annual eye exam?  No  Who is the provider or what is the name of the office in which the patient attends annual eye exams? Referral to Ophthalmology  If pt is not established with a provider, would they like to be referred to a provider to establish care? Yes .   Dental Screening: Recommended annual dental exams for proper oral hygiene  Community Resource Referral / Chronic Care Management: CRR required this visit?  No   CCM required this visit?  No      Plan:     I have personally reviewed and noted the following in the patients chart:   Medical and social history Use of alcohol, tobacco or illicit drugs  Current medications and supplements including opioid prescriptions.  Functional ability and status Nutritional status Physical activity Advanced directives List of other physicians Hospitalizations, surgeries, and ER visits in previous 12 months Vitals Screenings to include cognitive, depression, and falls Referrals and appointments  In addition, I have reviewed and discussed with patient certain preventive protocols, quality metrics, and best practice recommendations. A written personalized care plan for preventive services as well as general preventive health recommendations were provided to patient.   Ophthalmology referral, Shingrix, MMSE order provided.   Charly Holcomb X Sian Joles, NP   12/27/2021

## 2021-12-14 NOTE — Assessment & Plan Note (Addendum)
aching, malaise, congestion, fever, tested positive Flu, decreased appetite and oral intake. Will increase Tylenol 610m tid with meals x 3 days, Mucinex 6056mbid x5 days, Hold Furosemide/Kcl x3 days, update CBC/diff, CMP/eGFR.  12/14/21 wbc 5.0, Hgb 11.3, plt 195, neutrophils 73, Na 138, K 3.7, Bun 15, creat 0.46, eGFR 91

## 2021-12-14 NOTE — Assessment & Plan Note (Signed)
peripheral edema is not apparent today, hold Furosemide 40mg  qd x72 2/2 decreased oral intake.  Cardiology f/u

## 2021-12-14 NOTE — Assessment & Plan Note (Addendum)
Sbp is in 150s, on amlodipine Metoprolol, observe

## 2021-12-14 NOTE — Assessment & Plan Note (Signed)
S/p left bipolar hemiarthroplasty 10/12/20 for left hip fracture, healed. left leg/knee sometimes, takes Tylenol

## 2021-12-15 ENCOUNTER — Encounter: Payer: Self-pay | Admitting: Nurse Practitioner

## 2021-12-17 DIAGNOSIS — R278 Other lack of coordination: Secondary | ICD-10-CM | POA: Diagnosis not present

## 2021-12-17 DIAGNOSIS — R296 Repeated falls: Secondary | ICD-10-CM | POA: Diagnosis not present

## 2021-12-17 DIAGNOSIS — M6281 Muscle weakness (generalized): Secondary | ICD-10-CM | POA: Diagnosis not present

## 2021-12-17 DIAGNOSIS — Z9181 History of falling: Secondary | ICD-10-CM | POA: Diagnosis not present

## 2021-12-17 DIAGNOSIS — M25562 Pain in left knee: Secondary | ICD-10-CM | POA: Diagnosis not present

## 2021-12-17 DIAGNOSIS — M1712 Unilateral primary osteoarthritis, left knee: Secondary | ICD-10-CM | POA: Diagnosis not present

## 2021-12-18 DIAGNOSIS — M6281 Muscle weakness (generalized): Secondary | ICD-10-CM | POA: Diagnosis not present

## 2021-12-18 DIAGNOSIS — Z9181 History of falling: Secondary | ICD-10-CM | POA: Diagnosis not present

## 2021-12-18 DIAGNOSIS — R296 Repeated falls: Secondary | ICD-10-CM | POA: Diagnosis not present

## 2021-12-18 DIAGNOSIS — M1712 Unilateral primary osteoarthritis, left knee: Secondary | ICD-10-CM | POA: Diagnosis not present

## 2021-12-18 DIAGNOSIS — M25562 Pain in left knee: Secondary | ICD-10-CM | POA: Diagnosis not present

## 2021-12-18 DIAGNOSIS — R278 Other lack of coordination: Secondary | ICD-10-CM | POA: Diagnosis not present

## 2021-12-20 DIAGNOSIS — Z9181 History of falling: Secondary | ICD-10-CM | POA: Diagnosis not present

## 2021-12-20 DIAGNOSIS — M25562 Pain in left knee: Secondary | ICD-10-CM | POA: Diagnosis not present

## 2021-12-20 DIAGNOSIS — M1712 Unilateral primary osteoarthritis, left knee: Secondary | ICD-10-CM | POA: Diagnosis not present

## 2021-12-20 DIAGNOSIS — M6281 Muscle weakness (generalized): Secondary | ICD-10-CM | POA: Diagnosis not present

## 2021-12-20 DIAGNOSIS — R278 Other lack of coordination: Secondary | ICD-10-CM | POA: Diagnosis not present

## 2021-12-20 DIAGNOSIS — R296 Repeated falls: Secondary | ICD-10-CM | POA: Diagnosis not present

## 2021-12-21 DIAGNOSIS — M6281 Muscle weakness (generalized): Secondary | ICD-10-CM | POA: Diagnosis not present

## 2021-12-21 DIAGNOSIS — M1712 Unilateral primary osteoarthritis, left knee: Secondary | ICD-10-CM | POA: Diagnosis not present

## 2021-12-21 DIAGNOSIS — R296 Repeated falls: Secondary | ICD-10-CM | POA: Diagnosis not present

## 2021-12-21 DIAGNOSIS — M25562 Pain in left knee: Secondary | ICD-10-CM | POA: Diagnosis not present

## 2021-12-21 DIAGNOSIS — R278 Other lack of coordination: Secondary | ICD-10-CM | POA: Diagnosis not present

## 2021-12-21 DIAGNOSIS — Z9181 History of falling: Secondary | ICD-10-CM | POA: Diagnosis not present

## 2021-12-24 DIAGNOSIS — M25562 Pain in left knee: Secondary | ICD-10-CM | POA: Diagnosis not present

## 2021-12-24 DIAGNOSIS — M1712 Unilateral primary osteoarthritis, left knee: Secondary | ICD-10-CM | POA: Diagnosis not present

## 2021-12-24 DIAGNOSIS — Z9181 History of falling: Secondary | ICD-10-CM | POA: Diagnosis not present

## 2021-12-24 DIAGNOSIS — R278 Other lack of coordination: Secondary | ICD-10-CM | POA: Diagnosis not present

## 2021-12-24 DIAGNOSIS — M6281 Muscle weakness (generalized): Secondary | ICD-10-CM | POA: Diagnosis not present

## 2021-12-24 DIAGNOSIS — R296 Repeated falls: Secondary | ICD-10-CM | POA: Diagnosis not present

## 2021-12-26 DIAGNOSIS — M1712 Unilateral primary osteoarthritis, left knee: Secondary | ICD-10-CM | POA: Diagnosis not present

## 2021-12-26 DIAGNOSIS — R278 Other lack of coordination: Secondary | ICD-10-CM | POA: Diagnosis not present

## 2021-12-26 DIAGNOSIS — M6281 Muscle weakness (generalized): Secondary | ICD-10-CM | POA: Diagnosis not present

## 2021-12-26 DIAGNOSIS — Z9181 History of falling: Secondary | ICD-10-CM | POA: Diagnosis not present

## 2021-12-26 DIAGNOSIS — M25562 Pain in left knee: Secondary | ICD-10-CM | POA: Diagnosis not present

## 2021-12-26 DIAGNOSIS — R296 Repeated falls: Secondary | ICD-10-CM | POA: Diagnosis not present

## 2021-12-27 DIAGNOSIS — R278 Other lack of coordination: Secondary | ICD-10-CM | POA: Diagnosis not present

## 2021-12-27 DIAGNOSIS — M25562 Pain in left knee: Secondary | ICD-10-CM | POA: Diagnosis not present

## 2021-12-27 DIAGNOSIS — Z9181 History of falling: Secondary | ICD-10-CM | POA: Diagnosis not present

## 2021-12-27 DIAGNOSIS — M1712 Unilateral primary osteoarthritis, left knee: Secondary | ICD-10-CM | POA: Diagnosis not present

## 2021-12-27 DIAGNOSIS — R296 Repeated falls: Secondary | ICD-10-CM | POA: Diagnosis not present

## 2021-12-27 DIAGNOSIS — M6281 Muscle weakness (generalized): Secondary | ICD-10-CM | POA: Diagnosis not present

## 2021-12-28 DIAGNOSIS — M25562 Pain in left knee: Secondary | ICD-10-CM | POA: Diagnosis not present

## 2021-12-28 DIAGNOSIS — M1712 Unilateral primary osteoarthritis, left knee: Secondary | ICD-10-CM | POA: Diagnosis not present

## 2021-12-28 DIAGNOSIS — M6281 Muscle weakness (generalized): Secondary | ICD-10-CM | POA: Diagnosis not present

## 2021-12-28 DIAGNOSIS — Z9181 History of falling: Secondary | ICD-10-CM | POA: Diagnosis not present

## 2021-12-28 DIAGNOSIS — R296 Repeated falls: Secondary | ICD-10-CM | POA: Diagnosis not present

## 2021-12-28 DIAGNOSIS — R278 Other lack of coordination: Secondary | ICD-10-CM | POA: Diagnosis not present

## 2021-12-31 DIAGNOSIS — M1712 Unilateral primary osteoarthritis, left knee: Secondary | ICD-10-CM | POA: Diagnosis not present

## 2021-12-31 DIAGNOSIS — R41841 Cognitive communication deficit: Secondary | ICD-10-CM | POA: Diagnosis not present

## 2021-12-31 DIAGNOSIS — R296 Repeated falls: Secondary | ICD-10-CM | POA: Diagnosis not present

## 2021-12-31 DIAGNOSIS — Z9181 History of falling: Secondary | ICD-10-CM | POA: Diagnosis not present

## 2021-12-31 DIAGNOSIS — M25562 Pain in left knee: Secondary | ICD-10-CM | POA: Diagnosis not present

## 2021-12-31 DIAGNOSIS — M810 Age-related osteoporosis without current pathological fracture: Secondary | ICD-10-CM | POA: Diagnosis not present

## 2021-12-31 DIAGNOSIS — R278 Other lack of coordination: Secondary | ICD-10-CM | POA: Diagnosis not present

## 2021-12-31 DIAGNOSIS — M6281 Muscle weakness (generalized): Secondary | ICD-10-CM | POA: Diagnosis not present

## 2022-01-02 DIAGNOSIS — M6281 Muscle weakness (generalized): Secondary | ICD-10-CM | POA: Diagnosis not present

## 2022-01-02 DIAGNOSIS — M25562 Pain in left knee: Secondary | ICD-10-CM | POA: Diagnosis not present

## 2022-01-02 DIAGNOSIS — R41841 Cognitive communication deficit: Secondary | ICD-10-CM | POA: Diagnosis not present

## 2022-01-02 DIAGNOSIS — R278 Other lack of coordination: Secondary | ICD-10-CM | POA: Diagnosis not present

## 2022-01-02 DIAGNOSIS — M1712 Unilateral primary osteoarthritis, left knee: Secondary | ICD-10-CM | POA: Diagnosis not present

## 2022-01-02 DIAGNOSIS — Z9181 History of falling: Secondary | ICD-10-CM | POA: Diagnosis not present

## 2022-01-03 DIAGNOSIS — Z9181 History of falling: Secondary | ICD-10-CM | POA: Diagnosis not present

## 2022-01-03 DIAGNOSIS — M25562 Pain in left knee: Secondary | ICD-10-CM | POA: Diagnosis not present

## 2022-01-03 DIAGNOSIS — R41841 Cognitive communication deficit: Secondary | ICD-10-CM | POA: Diagnosis not present

## 2022-01-03 DIAGNOSIS — M1712 Unilateral primary osteoarthritis, left knee: Secondary | ICD-10-CM | POA: Diagnosis not present

## 2022-01-03 DIAGNOSIS — M6281 Muscle weakness (generalized): Secondary | ICD-10-CM | POA: Diagnosis not present

## 2022-01-03 DIAGNOSIS — R278 Other lack of coordination: Secondary | ICD-10-CM | POA: Diagnosis not present

## 2022-01-07 DIAGNOSIS — M6281 Muscle weakness (generalized): Secondary | ICD-10-CM | POA: Diagnosis not present

## 2022-01-07 DIAGNOSIS — R41841 Cognitive communication deficit: Secondary | ICD-10-CM | POA: Diagnosis not present

## 2022-01-07 DIAGNOSIS — M25562 Pain in left knee: Secondary | ICD-10-CM | POA: Diagnosis not present

## 2022-01-07 DIAGNOSIS — Z9181 History of falling: Secondary | ICD-10-CM | POA: Diagnosis not present

## 2022-01-07 DIAGNOSIS — R278 Other lack of coordination: Secondary | ICD-10-CM | POA: Diagnosis not present

## 2022-01-07 DIAGNOSIS — M1712 Unilateral primary osteoarthritis, left knee: Secondary | ICD-10-CM | POA: Diagnosis not present

## 2022-01-08 DIAGNOSIS — R41841 Cognitive communication deficit: Secondary | ICD-10-CM | POA: Diagnosis not present

## 2022-01-08 DIAGNOSIS — Z9181 History of falling: Secondary | ICD-10-CM | POA: Diagnosis not present

## 2022-01-08 DIAGNOSIS — M1712 Unilateral primary osteoarthritis, left knee: Secondary | ICD-10-CM | POA: Diagnosis not present

## 2022-01-08 DIAGNOSIS — M25562 Pain in left knee: Secondary | ICD-10-CM | POA: Diagnosis not present

## 2022-01-08 DIAGNOSIS — R278 Other lack of coordination: Secondary | ICD-10-CM | POA: Diagnosis not present

## 2022-01-08 DIAGNOSIS — M6281 Muscle weakness (generalized): Secondary | ICD-10-CM | POA: Diagnosis not present

## 2022-01-09 DIAGNOSIS — R41841 Cognitive communication deficit: Secondary | ICD-10-CM | POA: Diagnosis not present

## 2022-01-09 DIAGNOSIS — Z9181 History of falling: Secondary | ICD-10-CM | POA: Diagnosis not present

## 2022-01-09 DIAGNOSIS — M1712 Unilateral primary osteoarthritis, left knee: Secondary | ICD-10-CM | POA: Diagnosis not present

## 2022-01-09 DIAGNOSIS — M25562 Pain in left knee: Secondary | ICD-10-CM | POA: Diagnosis not present

## 2022-01-09 DIAGNOSIS — R278 Other lack of coordination: Secondary | ICD-10-CM | POA: Diagnosis not present

## 2022-01-09 DIAGNOSIS — M6281 Muscle weakness (generalized): Secondary | ICD-10-CM | POA: Diagnosis not present

## 2022-01-10 DIAGNOSIS — R41841 Cognitive communication deficit: Secondary | ICD-10-CM | POA: Diagnosis not present

## 2022-01-10 DIAGNOSIS — M25562 Pain in left knee: Secondary | ICD-10-CM | POA: Diagnosis not present

## 2022-01-10 DIAGNOSIS — M6281 Muscle weakness (generalized): Secondary | ICD-10-CM | POA: Diagnosis not present

## 2022-01-10 DIAGNOSIS — R278 Other lack of coordination: Secondary | ICD-10-CM | POA: Diagnosis not present

## 2022-01-10 DIAGNOSIS — M1712 Unilateral primary osteoarthritis, left knee: Secondary | ICD-10-CM | POA: Diagnosis not present

## 2022-01-10 DIAGNOSIS — Z9181 History of falling: Secondary | ICD-10-CM | POA: Diagnosis not present

## 2022-01-11 DIAGNOSIS — M25562 Pain in left knee: Secondary | ICD-10-CM | POA: Diagnosis not present

## 2022-01-11 DIAGNOSIS — M6281 Muscle weakness (generalized): Secondary | ICD-10-CM | POA: Diagnosis not present

## 2022-01-11 DIAGNOSIS — R41841 Cognitive communication deficit: Secondary | ICD-10-CM | POA: Diagnosis not present

## 2022-01-11 DIAGNOSIS — Z9181 History of falling: Secondary | ICD-10-CM | POA: Diagnosis not present

## 2022-01-11 DIAGNOSIS — R278 Other lack of coordination: Secondary | ICD-10-CM | POA: Diagnosis not present

## 2022-01-11 DIAGNOSIS — M1712 Unilateral primary osteoarthritis, left knee: Secondary | ICD-10-CM | POA: Diagnosis not present

## 2022-01-14 DIAGNOSIS — Z9181 History of falling: Secondary | ICD-10-CM | POA: Diagnosis not present

## 2022-01-14 DIAGNOSIS — M25562 Pain in left knee: Secondary | ICD-10-CM | POA: Diagnosis not present

## 2022-01-14 DIAGNOSIS — M1712 Unilateral primary osteoarthritis, left knee: Secondary | ICD-10-CM | POA: Diagnosis not present

## 2022-01-14 DIAGNOSIS — R278 Other lack of coordination: Secondary | ICD-10-CM | POA: Diagnosis not present

## 2022-01-14 DIAGNOSIS — M6281 Muscle weakness (generalized): Secondary | ICD-10-CM | POA: Diagnosis not present

## 2022-01-14 DIAGNOSIS — R41841 Cognitive communication deficit: Secondary | ICD-10-CM | POA: Diagnosis not present

## 2022-01-15 DIAGNOSIS — M1712 Unilateral primary osteoarthritis, left knee: Secondary | ICD-10-CM | POA: Diagnosis not present

## 2022-01-15 DIAGNOSIS — M6281 Muscle weakness (generalized): Secondary | ICD-10-CM | POA: Diagnosis not present

## 2022-01-15 DIAGNOSIS — R278 Other lack of coordination: Secondary | ICD-10-CM | POA: Diagnosis not present

## 2022-01-15 DIAGNOSIS — R41841 Cognitive communication deficit: Secondary | ICD-10-CM | POA: Diagnosis not present

## 2022-01-15 DIAGNOSIS — Z9181 History of falling: Secondary | ICD-10-CM | POA: Diagnosis not present

## 2022-01-15 DIAGNOSIS — M25562 Pain in left knee: Secondary | ICD-10-CM | POA: Diagnosis not present

## 2022-01-17 DIAGNOSIS — M6281 Muscle weakness (generalized): Secondary | ICD-10-CM | POA: Diagnosis not present

## 2022-01-17 DIAGNOSIS — M1712 Unilateral primary osteoarthritis, left knee: Secondary | ICD-10-CM | POA: Diagnosis not present

## 2022-01-17 DIAGNOSIS — M25562 Pain in left knee: Secondary | ICD-10-CM | POA: Diagnosis not present

## 2022-01-17 DIAGNOSIS — R41841 Cognitive communication deficit: Secondary | ICD-10-CM | POA: Diagnosis not present

## 2022-01-17 DIAGNOSIS — Z9181 History of falling: Secondary | ICD-10-CM | POA: Diagnosis not present

## 2022-01-17 DIAGNOSIS — R278 Other lack of coordination: Secondary | ICD-10-CM | POA: Diagnosis not present

## 2022-01-18 DIAGNOSIS — R41841 Cognitive communication deficit: Secondary | ICD-10-CM | POA: Diagnosis not present

## 2022-01-18 DIAGNOSIS — M6281 Muscle weakness (generalized): Secondary | ICD-10-CM | POA: Diagnosis not present

## 2022-01-18 DIAGNOSIS — Z9181 History of falling: Secondary | ICD-10-CM | POA: Diagnosis not present

## 2022-01-18 DIAGNOSIS — M25562 Pain in left knee: Secondary | ICD-10-CM | POA: Diagnosis not present

## 2022-01-18 DIAGNOSIS — M1712 Unilateral primary osteoarthritis, left knee: Secondary | ICD-10-CM | POA: Diagnosis not present

## 2022-01-18 DIAGNOSIS — R278 Other lack of coordination: Secondary | ICD-10-CM | POA: Diagnosis not present

## 2022-01-21 DIAGNOSIS — M1712 Unilateral primary osteoarthritis, left knee: Secondary | ICD-10-CM | POA: Diagnosis not present

## 2022-01-21 DIAGNOSIS — Z9181 History of falling: Secondary | ICD-10-CM | POA: Diagnosis not present

## 2022-01-21 DIAGNOSIS — M25562 Pain in left knee: Secondary | ICD-10-CM | POA: Diagnosis not present

## 2022-01-21 DIAGNOSIS — R41841 Cognitive communication deficit: Secondary | ICD-10-CM | POA: Diagnosis not present

## 2022-01-21 DIAGNOSIS — R278 Other lack of coordination: Secondary | ICD-10-CM | POA: Diagnosis not present

## 2022-01-21 DIAGNOSIS — M6281 Muscle weakness (generalized): Secondary | ICD-10-CM | POA: Diagnosis not present

## 2022-01-22 DIAGNOSIS — R278 Other lack of coordination: Secondary | ICD-10-CM | POA: Diagnosis not present

## 2022-01-22 DIAGNOSIS — M6281 Muscle weakness (generalized): Secondary | ICD-10-CM | POA: Diagnosis not present

## 2022-01-22 DIAGNOSIS — R41841 Cognitive communication deficit: Secondary | ICD-10-CM | POA: Diagnosis not present

## 2022-01-22 DIAGNOSIS — Z9181 History of falling: Secondary | ICD-10-CM | POA: Diagnosis not present

## 2022-01-22 DIAGNOSIS — M1712 Unilateral primary osteoarthritis, left knee: Secondary | ICD-10-CM | POA: Diagnosis not present

## 2022-01-22 DIAGNOSIS — M25562 Pain in left knee: Secondary | ICD-10-CM | POA: Diagnosis not present

## 2022-01-23 DIAGNOSIS — R41841 Cognitive communication deficit: Secondary | ICD-10-CM | POA: Diagnosis not present

## 2022-01-23 DIAGNOSIS — M6281 Muscle weakness (generalized): Secondary | ICD-10-CM | POA: Diagnosis not present

## 2022-01-23 DIAGNOSIS — M1712 Unilateral primary osteoarthritis, left knee: Secondary | ICD-10-CM | POA: Diagnosis not present

## 2022-01-23 DIAGNOSIS — Z9181 History of falling: Secondary | ICD-10-CM | POA: Diagnosis not present

## 2022-01-23 DIAGNOSIS — M25562 Pain in left knee: Secondary | ICD-10-CM | POA: Diagnosis not present

## 2022-01-23 DIAGNOSIS — R278 Other lack of coordination: Secondary | ICD-10-CM | POA: Diagnosis not present

## 2022-01-24 DIAGNOSIS — M1712 Unilateral primary osteoarthritis, left knee: Secondary | ICD-10-CM | POA: Diagnosis not present

## 2022-01-24 DIAGNOSIS — M6281 Muscle weakness (generalized): Secondary | ICD-10-CM | POA: Diagnosis not present

## 2022-01-24 DIAGNOSIS — R41841 Cognitive communication deficit: Secondary | ICD-10-CM | POA: Diagnosis not present

## 2022-01-24 DIAGNOSIS — M25562 Pain in left knee: Secondary | ICD-10-CM | POA: Diagnosis not present

## 2022-01-24 DIAGNOSIS — R278 Other lack of coordination: Secondary | ICD-10-CM | POA: Diagnosis not present

## 2022-01-24 DIAGNOSIS — Z9181 History of falling: Secondary | ICD-10-CM | POA: Diagnosis not present

## 2022-01-28 DIAGNOSIS — R41841 Cognitive communication deficit: Secondary | ICD-10-CM | POA: Diagnosis not present

## 2022-01-28 DIAGNOSIS — R278 Other lack of coordination: Secondary | ICD-10-CM | POA: Diagnosis not present

## 2022-01-28 DIAGNOSIS — M25562 Pain in left knee: Secondary | ICD-10-CM | POA: Diagnosis not present

## 2022-01-28 DIAGNOSIS — M1712 Unilateral primary osteoarthritis, left knee: Secondary | ICD-10-CM | POA: Diagnosis not present

## 2022-01-28 DIAGNOSIS — Z9181 History of falling: Secondary | ICD-10-CM | POA: Diagnosis not present

## 2022-01-28 DIAGNOSIS — M6281 Muscle weakness (generalized): Secondary | ICD-10-CM | POA: Diagnosis not present

## 2022-01-29 DIAGNOSIS — R41841 Cognitive communication deficit: Secondary | ICD-10-CM | POA: Diagnosis not present

## 2022-01-29 DIAGNOSIS — M25562 Pain in left knee: Secondary | ICD-10-CM | POA: Diagnosis not present

## 2022-01-29 DIAGNOSIS — Z9181 History of falling: Secondary | ICD-10-CM | POA: Diagnosis not present

## 2022-01-29 DIAGNOSIS — M6281 Muscle weakness (generalized): Secondary | ICD-10-CM | POA: Diagnosis not present

## 2022-01-29 DIAGNOSIS — R278 Other lack of coordination: Secondary | ICD-10-CM | POA: Diagnosis not present

## 2022-01-29 DIAGNOSIS — M1712 Unilateral primary osteoarthritis, left knee: Secondary | ICD-10-CM | POA: Diagnosis not present

## 2022-01-30 DIAGNOSIS — M810 Age-related osteoporosis without current pathological fracture: Secondary | ICD-10-CM | POA: Diagnosis not present

## 2022-01-30 DIAGNOSIS — R278 Other lack of coordination: Secondary | ICD-10-CM | POA: Diagnosis not present

## 2022-01-30 DIAGNOSIS — M1712 Unilateral primary osteoarthritis, left knee: Secondary | ICD-10-CM | POA: Diagnosis not present

## 2022-01-30 DIAGNOSIS — R41841 Cognitive communication deficit: Secondary | ICD-10-CM | POA: Diagnosis not present

## 2022-01-30 DIAGNOSIS — M25562 Pain in left knee: Secondary | ICD-10-CM | POA: Diagnosis not present

## 2022-01-30 DIAGNOSIS — Z9181 History of falling: Secondary | ICD-10-CM | POA: Diagnosis not present

## 2022-01-30 DIAGNOSIS — R296 Repeated falls: Secondary | ICD-10-CM | POA: Diagnosis not present

## 2022-01-30 DIAGNOSIS — M6281 Muscle weakness (generalized): Secondary | ICD-10-CM | POA: Diagnosis not present

## 2022-01-31 DIAGNOSIS — R296 Repeated falls: Secondary | ICD-10-CM | POA: Diagnosis not present

## 2022-01-31 DIAGNOSIS — R278 Other lack of coordination: Secondary | ICD-10-CM | POA: Diagnosis not present

## 2022-01-31 DIAGNOSIS — M1712 Unilateral primary osteoarthritis, left knee: Secondary | ICD-10-CM | POA: Diagnosis not present

## 2022-01-31 DIAGNOSIS — R41841 Cognitive communication deficit: Secondary | ICD-10-CM | POA: Diagnosis not present

## 2022-01-31 DIAGNOSIS — Z9181 History of falling: Secondary | ICD-10-CM | POA: Diagnosis not present

## 2022-01-31 DIAGNOSIS — M25562 Pain in left knee: Secondary | ICD-10-CM | POA: Diagnosis not present

## 2022-02-04 DIAGNOSIS — R41841 Cognitive communication deficit: Secondary | ICD-10-CM | POA: Diagnosis not present

## 2022-02-04 DIAGNOSIS — R296 Repeated falls: Secondary | ICD-10-CM | POA: Diagnosis not present

## 2022-02-04 DIAGNOSIS — M25562 Pain in left knee: Secondary | ICD-10-CM | POA: Diagnosis not present

## 2022-02-04 DIAGNOSIS — M1712 Unilateral primary osteoarthritis, left knee: Secondary | ICD-10-CM | POA: Diagnosis not present

## 2022-02-04 DIAGNOSIS — Z9181 History of falling: Secondary | ICD-10-CM | POA: Diagnosis not present

## 2022-02-04 DIAGNOSIS — R278 Other lack of coordination: Secondary | ICD-10-CM | POA: Diagnosis not present

## 2022-02-05 DIAGNOSIS — R296 Repeated falls: Secondary | ICD-10-CM | POA: Diagnosis not present

## 2022-02-05 DIAGNOSIS — R278 Other lack of coordination: Secondary | ICD-10-CM | POA: Diagnosis not present

## 2022-02-05 DIAGNOSIS — Z9181 History of falling: Secondary | ICD-10-CM | POA: Diagnosis not present

## 2022-02-05 DIAGNOSIS — M1712 Unilateral primary osteoarthritis, left knee: Secondary | ICD-10-CM | POA: Diagnosis not present

## 2022-02-05 DIAGNOSIS — R41841 Cognitive communication deficit: Secondary | ICD-10-CM | POA: Diagnosis not present

## 2022-02-05 DIAGNOSIS — M25562 Pain in left knee: Secondary | ICD-10-CM | POA: Diagnosis not present

## 2022-02-06 DIAGNOSIS — M1712 Unilateral primary osteoarthritis, left knee: Secondary | ICD-10-CM | POA: Diagnosis not present

## 2022-02-06 DIAGNOSIS — R41841 Cognitive communication deficit: Secondary | ICD-10-CM | POA: Diagnosis not present

## 2022-02-06 DIAGNOSIS — R296 Repeated falls: Secondary | ICD-10-CM | POA: Diagnosis not present

## 2022-02-06 DIAGNOSIS — R278 Other lack of coordination: Secondary | ICD-10-CM | POA: Diagnosis not present

## 2022-02-06 DIAGNOSIS — M25562 Pain in left knee: Secondary | ICD-10-CM | POA: Diagnosis not present

## 2022-02-06 DIAGNOSIS — Z9181 History of falling: Secondary | ICD-10-CM | POA: Diagnosis not present

## 2022-02-07 DIAGNOSIS — R41841 Cognitive communication deficit: Secondary | ICD-10-CM | POA: Diagnosis not present

## 2022-02-07 DIAGNOSIS — R278 Other lack of coordination: Secondary | ICD-10-CM | POA: Diagnosis not present

## 2022-02-07 DIAGNOSIS — M25562 Pain in left knee: Secondary | ICD-10-CM | POA: Diagnosis not present

## 2022-02-07 DIAGNOSIS — R296 Repeated falls: Secondary | ICD-10-CM | POA: Diagnosis not present

## 2022-02-07 DIAGNOSIS — Z9181 History of falling: Secondary | ICD-10-CM | POA: Diagnosis not present

## 2022-02-07 DIAGNOSIS — M1712 Unilateral primary osteoarthritis, left knee: Secondary | ICD-10-CM | POA: Diagnosis not present

## 2022-02-11 DIAGNOSIS — Z9181 History of falling: Secondary | ICD-10-CM | POA: Diagnosis not present

## 2022-02-11 DIAGNOSIS — R296 Repeated falls: Secondary | ICD-10-CM | POA: Diagnosis not present

## 2022-02-11 DIAGNOSIS — M1712 Unilateral primary osteoarthritis, left knee: Secondary | ICD-10-CM | POA: Diagnosis not present

## 2022-02-11 DIAGNOSIS — M25562 Pain in left knee: Secondary | ICD-10-CM | POA: Diagnosis not present

## 2022-02-11 DIAGNOSIS — R278 Other lack of coordination: Secondary | ICD-10-CM | POA: Diagnosis not present

## 2022-02-11 DIAGNOSIS — R41841 Cognitive communication deficit: Secondary | ICD-10-CM | POA: Diagnosis not present

## 2022-02-12 DIAGNOSIS — Z9181 History of falling: Secondary | ICD-10-CM | POA: Diagnosis not present

## 2022-02-12 DIAGNOSIS — R41841 Cognitive communication deficit: Secondary | ICD-10-CM | POA: Diagnosis not present

## 2022-02-12 DIAGNOSIS — M1712 Unilateral primary osteoarthritis, left knee: Secondary | ICD-10-CM | POA: Diagnosis not present

## 2022-02-12 DIAGNOSIS — M25562 Pain in left knee: Secondary | ICD-10-CM | POA: Diagnosis not present

## 2022-02-12 DIAGNOSIS — R278 Other lack of coordination: Secondary | ICD-10-CM | POA: Diagnosis not present

## 2022-02-12 DIAGNOSIS — R296 Repeated falls: Secondary | ICD-10-CM | POA: Diagnosis not present

## 2022-02-13 DIAGNOSIS — H25813 Combined forms of age-related cataract, bilateral: Secondary | ICD-10-CM | POA: Diagnosis not present

## 2022-02-13 DIAGNOSIS — H52203 Unspecified astigmatism, bilateral: Secondary | ICD-10-CM | POA: Diagnosis not present

## 2022-02-13 DIAGNOSIS — H524 Presbyopia: Secondary | ICD-10-CM | POA: Diagnosis not present

## 2022-02-13 DIAGNOSIS — H5213 Myopia, bilateral: Secondary | ICD-10-CM | POA: Diagnosis not present

## 2022-02-14 DIAGNOSIS — M1712 Unilateral primary osteoarthritis, left knee: Secondary | ICD-10-CM | POA: Diagnosis not present

## 2022-02-14 DIAGNOSIS — R41841 Cognitive communication deficit: Secondary | ICD-10-CM | POA: Diagnosis not present

## 2022-02-14 DIAGNOSIS — R296 Repeated falls: Secondary | ICD-10-CM | POA: Diagnosis not present

## 2022-02-14 DIAGNOSIS — R278 Other lack of coordination: Secondary | ICD-10-CM | POA: Diagnosis not present

## 2022-02-14 DIAGNOSIS — M25562 Pain in left knee: Secondary | ICD-10-CM | POA: Diagnosis not present

## 2022-02-14 DIAGNOSIS — Z9181 History of falling: Secondary | ICD-10-CM | POA: Diagnosis not present

## 2022-02-18 DIAGNOSIS — M1712 Unilateral primary osteoarthritis, left knee: Secondary | ICD-10-CM | POA: Diagnosis not present

## 2022-02-18 DIAGNOSIS — R296 Repeated falls: Secondary | ICD-10-CM | POA: Diagnosis not present

## 2022-02-18 DIAGNOSIS — R41841 Cognitive communication deficit: Secondary | ICD-10-CM | POA: Diagnosis not present

## 2022-02-18 DIAGNOSIS — R278 Other lack of coordination: Secondary | ICD-10-CM | POA: Diagnosis not present

## 2022-02-18 DIAGNOSIS — M25562 Pain in left knee: Secondary | ICD-10-CM | POA: Diagnosis not present

## 2022-02-18 DIAGNOSIS — Z9181 History of falling: Secondary | ICD-10-CM | POA: Diagnosis not present

## 2022-02-19 DIAGNOSIS — M1712 Unilateral primary osteoarthritis, left knee: Secondary | ICD-10-CM | POA: Diagnosis not present

## 2022-02-19 DIAGNOSIS — Z9181 History of falling: Secondary | ICD-10-CM | POA: Diagnosis not present

## 2022-02-19 DIAGNOSIS — R41841 Cognitive communication deficit: Secondary | ICD-10-CM | POA: Diagnosis not present

## 2022-02-19 DIAGNOSIS — R296 Repeated falls: Secondary | ICD-10-CM | POA: Diagnosis not present

## 2022-02-19 DIAGNOSIS — R278 Other lack of coordination: Secondary | ICD-10-CM | POA: Diagnosis not present

## 2022-02-19 DIAGNOSIS — M25562 Pain in left knee: Secondary | ICD-10-CM | POA: Diagnosis not present

## 2022-02-21 DIAGNOSIS — M1712 Unilateral primary osteoarthritis, left knee: Secondary | ICD-10-CM | POA: Diagnosis not present

## 2022-02-21 DIAGNOSIS — R41841 Cognitive communication deficit: Secondary | ICD-10-CM | POA: Diagnosis not present

## 2022-02-21 DIAGNOSIS — R296 Repeated falls: Secondary | ICD-10-CM | POA: Diagnosis not present

## 2022-02-21 DIAGNOSIS — R278 Other lack of coordination: Secondary | ICD-10-CM | POA: Diagnosis not present

## 2022-02-21 DIAGNOSIS — M25562 Pain in left knee: Secondary | ICD-10-CM | POA: Diagnosis not present

## 2022-02-21 DIAGNOSIS — Z9181 History of falling: Secondary | ICD-10-CM | POA: Diagnosis not present

## 2022-02-25 DIAGNOSIS — M1712 Unilateral primary osteoarthritis, left knee: Secondary | ICD-10-CM | POA: Diagnosis not present

## 2022-02-25 DIAGNOSIS — Z9181 History of falling: Secondary | ICD-10-CM | POA: Diagnosis not present

## 2022-02-25 DIAGNOSIS — R41841 Cognitive communication deficit: Secondary | ICD-10-CM | POA: Diagnosis not present

## 2022-02-25 DIAGNOSIS — R278 Other lack of coordination: Secondary | ICD-10-CM | POA: Diagnosis not present

## 2022-02-25 DIAGNOSIS — M25562 Pain in left knee: Secondary | ICD-10-CM | POA: Diagnosis not present

## 2022-02-25 DIAGNOSIS — R296 Repeated falls: Secondary | ICD-10-CM | POA: Diagnosis not present

## 2022-02-27 DIAGNOSIS — R296 Repeated falls: Secondary | ICD-10-CM | POA: Diagnosis not present

## 2022-02-27 DIAGNOSIS — M810 Age-related osteoporosis without current pathological fracture: Secondary | ICD-10-CM | POA: Diagnosis not present

## 2022-02-27 DIAGNOSIS — R278 Other lack of coordination: Secondary | ICD-10-CM | POA: Diagnosis not present

## 2022-02-27 DIAGNOSIS — R41841 Cognitive communication deficit: Secondary | ICD-10-CM | POA: Diagnosis not present

## 2022-02-28 DIAGNOSIS — R296 Repeated falls: Secondary | ICD-10-CM | POA: Diagnosis not present

## 2022-02-28 DIAGNOSIS — R41841 Cognitive communication deficit: Secondary | ICD-10-CM | POA: Diagnosis not present

## 2022-02-28 DIAGNOSIS — R278 Other lack of coordination: Secondary | ICD-10-CM | POA: Diagnosis not present

## 2022-02-28 DIAGNOSIS — M810 Age-related osteoporosis without current pathological fracture: Secondary | ICD-10-CM | POA: Diagnosis not present

## 2022-03-04 DIAGNOSIS — M810 Age-related osteoporosis without current pathological fracture: Secondary | ICD-10-CM | POA: Diagnosis not present

## 2022-03-04 DIAGNOSIS — R278 Other lack of coordination: Secondary | ICD-10-CM | POA: Diagnosis not present

## 2022-03-04 DIAGNOSIS — R41841 Cognitive communication deficit: Secondary | ICD-10-CM | POA: Diagnosis not present

## 2022-03-04 DIAGNOSIS — R296 Repeated falls: Secondary | ICD-10-CM | POA: Diagnosis not present

## 2022-03-05 DIAGNOSIS — R278 Other lack of coordination: Secondary | ICD-10-CM | POA: Diagnosis not present

## 2022-03-05 DIAGNOSIS — M810 Age-related osteoporosis without current pathological fracture: Secondary | ICD-10-CM | POA: Diagnosis not present

## 2022-03-05 DIAGNOSIS — R41841 Cognitive communication deficit: Secondary | ICD-10-CM | POA: Diagnosis not present

## 2022-03-05 DIAGNOSIS — R296 Repeated falls: Secondary | ICD-10-CM | POA: Diagnosis not present

## 2022-03-07 DIAGNOSIS — M810 Age-related osteoporosis without current pathological fracture: Secondary | ICD-10-CM | POA: Diagnosis not present

## 2022-03-07 DIAGNOSIS — R296 Repeated falls: Secondary | ICD-10-CM | POA: Diagnosis not present

## 2022-03-07 DIAGNOSIS — R278 Other lack of coordination: Secondary | ICD-10-CM | POA: Diagnosis not present

## 2022-03-07 DIAGNOSIS — R41841 Cognitive communication deficit: Secondary | ICD-10-CM | POA: Diagnosis not present

## 2022-03-11 DIAGNOSIS — R278 Other lack of coordination: Secondary | ICD-10-CM | POA: Diagnosis not present

## 2022-03-11 DIAGNOSIS — R41841 Cognitive communication deficit: Secondary | ICD-10-CM | POA: Diagnosis not present

## 2022-03-11 DIAGNOSIS — M810 Age-related osteoporosis without current pathological fracture: Secondary | ICD-10-CM | POA: Diagnosis not present

## 2022-03-11 DIAGNOSIS — R296 Repeated falls: Secondary | ICD-10-CM | POA: Diagnosis not present

## 2022-03-14 DIAGNOSIS — R41841 Cognitive communication deficit: Secondary | ICD-10-CM | POA: Diagnosis not present

## 2022-03-14 DIAGNOSIS — R278 Other lack of coordination: Secondary | ICD-10-CM | POA: Diagnosis not present

## 2022-03-14 DIAGNOSIS — M810 Age-related osteoporosis without current pathological fracture: Secondary | ICD-10-CM | POA: Diagnosis not present

## 2022-03-14 DIAGNOSIS — R296 Repeated falls: Secondary | ICD-10-CM | POA: Diagnosis not present

## 2022-03-18 DIAGNOSIS — M810 Age-related osteoporosis without current pathological fracture: Secondary | ICD-10-CM | POA: Diagnosis not present

## 2022-03-18 DIAGNOSIS — R41841 Cognitive communication deficit: Secondary | ICD-10-CM | POA: Diagnosis not present

## 2022-03-18 DIAGNOSIS — R296 Repeated falls: Secondary | ICD-10-CM | POA: Diagnosis not present

## 2022-03-18 DIAGNOSIS — R278 Other lack of coordination: Secondary | ICD-10-CM | POA: Diagnosis not present

## 2022-03-21 DIAGNOSIS — R278 Other lack of coordination: Secondary | ICD-10-CM | POA: Diagnosis not present

## 2022-03-21 DIAGNOSIS — R41841 Cognitive communication deficit: Secondary | ICD-10-CM | POA: Diagnosis not present

## 2022-03-21 DIAGNOSIS — R296 Repeated falls: Secondary | ICD-10-CM | POA: Diagnosis not present

## 2022-03-21 DIAGNOSIS — M810 Age-related osteoporosis without current pathological fracture: Secondary | ICD-10-CM | POA: Diagnosis not present

## 2022-03-25 DIAGNOSIS — R278 Other lack of coordination: Secondary | ICD-10-CM | POA: Diagnosis not present

## 2022-03-25 DIAGNOSIS — R41841 Cognitive communication deficit: Secondary | ICD-10-CM | POA: Diagnosis not present

## 2022-03-25 DIAGNOSIS — M810 Age-related osteoporosis without current pathological fracture: Secondary | ICD-10-CM | POA: Diagnosis not present

## 2022-03-25 DIAGNOSIS — R296 Repeated falls: Secondary | ICD-10-CM | POA: Diagnosis not present

## 2022-04-22 NOTE — Progress Notes (Signed)
? ? ? ? ?HPI: FU CHF and possible amyloidosis. Pt previously admitted with CHF symptoms following prior admission for repair of right hip fracture. CTA showed no pulmonary embolus, small bilateral pleural effusions, pulmonary edema and three-vessel coronary calcification. Echocardiogram 10/20 showed normal LV function, moderate left ventricular hypertrophy, grade 2 diastolic dysfunction, severe left atrial enlargement; consider amyloidosis.  At previous office visit patient requested no further evaluation for amyloid which I felt was appropriate given her age. Had repair of left femoral neck fracture October 2021. Since last seen she denies dyspnea, chest pain or syncope.  She has had 2 occasions where she felt her heart flutter transiently. ? ?Current Outpatient Medications  ?Medication Sig Dispense Refill  ? acetaminophen (TYLENOL) 325 MG tablet Take 650 mg by mouth in the morning and at bedtime. Not to exceed 3000 mg per 24 hours.    ? acetaminophen (TYLENOL) 500 MG tablet Take 500 mg by mouth every 8 (eight) hours as needed for moderate pain.    ? amLODipine (NORVASC) 10 MG tablet Take 1 tablet (10 mg total) by mouth daily. 180 tablet 3  ? aspirin EC 81 MG tablet Take 81 mg by mouth daily. Swallow whole.    ? docusate sodium (COLACE) 100 MG capsule Take 1 capsule (100 mg total) by mouth 2 (two) times daily as needed (for constipation). 30 capsule 0  ? fexofenadine (ALLEGRA) 180 MG tablet Take 180 mg by mouth daily as needed for allergies or rhinitis.     ? furosemide (LASIX) 40 MG tablet Take 1 tablet (40 mg total) by mouth daily. 90 tablet 1  ? Menthol, Topical Analgesic, (BIOFREEZE) 4 % GEL Apply 1 application topically 2 (two) times daily as needed (left knee pain).     ? metoprolol succinate (TOPROL-XL) 25 MG 24 hr tablet TAKE 1.5 TABLETS (37.5 MG TOTAL) BY MOUTH DAILY. 90 tablet 2  ? Multiple Vitamins-Minerals (MULTIVITAMIN ADULT) CHEW Chew 400 mg by mouth daily.    ? omeprazole (PRILOSEC) 20 MG capsule  Take 20 mg by mouth daily.    ? polyethylene glycol (MIRALAX / GLYCOLAX) 17 g packet Take 17 g by mouth daily.    ? potassium chloride SA (KLOR-CON) 20 MEQ tablet Take 20 mEq by mouth daily.    ? ?No current facility-administered medications for this visit.  ? ? ? ?Past Medical History:  ?Diagnosis Date  ? Aortic heart murmur   ? AS/AI on 2D ECHO ; SBE prophylaxis Rxed  ? Chest pain 2007 ; 2011  ? negative cardiac assessment  ? GERD (gastroesophageal reflux disease)   ? Hyperlipidemia   ? Hypertension [  ? Interstitial cystitis   ? Resolved  ? Ocular migraine 04/10/2012  ? Osteoporosis   ? T score - 3.0 @ femoral neck; Fosamax affected swallowing  ? Peripheral vascular disease ([place])   ? Postmenopausal   ? No PMH or HRT  ? ? ?Past Surgical History:  ?Procedure Laterality Date  ? ANTERIOR APPROACH HEMI HIP ARTHROPLASTY Right 09/03/2019  ? Procedure: ANTERIOR APPROACH HEMI HIP ARTHROPLASTY BIPOLAR;  Surgeon: [name], MD;  Location: [facility];  Service: Orthopedics;  Laterality: Right;  ? COLONOSCOPY  2011  ? Dr Sharlett Iles  ? HIP ARTHROPLASTY Left 10/11/2020  ? Procedure: ARTHROPLASTY BIPOLAR HIP (HEMIARTHROPLASTY) ANTERIOR APPROACH;  Surgeon: [name], MD;  Location: [facility];  Service: Orthopedics;  Laterality: Left;  ? ? ?Social History  ? ?Socioeconomic History  ? Marital status: Widowed  ?  Spouse name: Not on  file  ? Number of children: 1  ? Years of education: Not on file  ? Highest education level: Not on file  ?Occupational History  ? Not on file  ?Tobacco Use  ? Smoking status: Never  ? Smokeless tobacco: Never  ?Substance and Sexual Activity  ? Alcohol use: No  ? Drug use: No  ? Sexual activity: Not Currently  ?  Birth control/protection: None  ?Other Topics Concern  ? Not on file  ?Social History Narrative  ? Not on file  ? ?Social Determinants of Health  ? ?Financial Resource Strain: Not on file  ?Food Insecurity: Not on file  ?Transportation Needs: Not on file  ?Physical Activity: Not on file  ?Stress: Not  on file  ?Social Connections: Not on file  ?Intimate Partner Violence: Not on file  ? ? ?Family History  ?Problem Relation Age of Onset  ? Diabetes Father   ?     Pancreatitic insufficiency post MVA  ? Hypertension Mother   ? Osteoporosis Mother   ? Heart disease Mother   ?     AF  ? Thyroid disease Sister   ?     hypothyroidism  ? Coronary artery disease Sister   ?     AF  ? Stroke Sister 44  ?     also pulmonary disease  ? ? ?ROS: no fevers or chills, productive cough, hemoptysis, dysphasia, odynophagia, melena, hematochezia, dysuria, hematuria, rash, seizure activity, orthopnea, PND, pedal edema, claudication. Remaining systems are negative. ? ?Physical Exam: ?Well-developed well-nourished in no acute distress.  ?Skin is warm and dry.  ?HEENT is normal.  ?Neck is supple.  ?Chest is clear to auscultation with normal expansion.  ?Cardiovascular exam is regular rate and rhythm.  ?Abdominal exam nontender or distended. No masses palpated. ?Extremities show no edema. ?neuro grossly intact ? ?ECG-normal sinus rhythm, cannot rule out septal infarct, nonspecific ST changes.  Personally reviewed ? ?A/P ? ?1 chronic diastolic congestive heart failure-patient is euvolemic today.  We will continue diuretic at present dose.  Check potassium and renal function. ? ?2 hypertension-blood pressure controlled.  Continue present medical regimen. ? ?3 coronary artery disease-based on previous CT scan demonstrating calcification.  We will continue aspirin. ? ?4 possible amyloid-this was question on a prior echocardiogram.  However given patient's age she wanted only conservative measures.  I think this is appropriate. ? ?5 palpitations-rare brief palpitations.  If they worsen we will consider a monitor in the future. ? ?Kirk Ruths, MD ? ? ? ?

## 2022-05-06 ENCOUNTER — Encounter: Payer: Self-pay | Admitting: Cardiology

## 2022-05-06 ENCOUNTER — Ambulatory Visit (INDEPENDENT_AMBULATORY_CARE_PROVIDER_SITE_OTHER): Payer: MEDICARE | Admitting: Cardiology

## 2022-05-06 VITALS — BP 122/66 | HR 81 | Ht 65.0 in | Wt 145.0 lb

## 2022-05-06 DIAGNOSIS — I10 Essential (primary) hypertension: Secondary | ICD-10-CM

## 2022-05-06 DIAGNOSIS — R002 Palpitations: Secondary | ICD-10-CM | POA: Diagnosis not present

## 2022-05-06 DIAGNOSIS — I5032 Chronic diastolic (congestive) heart failure: Secondary | ICD-10-CM

## 2022-05-06 NOTE — Patient Instructions (Signed)

## 2022-05-07 ENCOUNTER — Encounter: Payer: Self-pay | Admitting: *Deleted

## 2022-05-07 LAB — BASIC METABOLIC PANEL
BUN/Creatinine Ratio: 23 (ref 12–28)
BUN: 14 mg/dL (ref 8–27)
CO2: 23 mmol/L (ref 20–29)
Calcium: 9.7 mg/dL (ref 8.7–10.3)
Chloride: 99 mmol/L (ref 96–106)
Creatinine, Ser: 0.61 mg/dL (ref 0.57–1.00)
Glucose: 92 mg/dL (ref 70–99)
Potassium: 4.2 mmol/L (ref 3.5–5.2)
Sodium: 138 mmol/L (ref 134–144)
eGFR: 85 mL/min/{1.73_m2} (ref >59)

## 2022-08-07 DIAGNOSIS — R41841 Cognitive communication deficit: Secondary | ICD-10-CM | POA: Diagnosis not present

## 2022-08-07 DIAGNOSIS — M6281 Muscle weakness (generalized): Secondary | ICD-10-CM | POA: Diagnosis not present

## 2022-08-07 DIAGNOSIS — Z9181 History of falling: Secondary | ICD-10-CM | POA: Diagnosis not present

## 2022-08-09 DIAGNOSIS — Z9181 History of falling: Secondary | ICD-10-CM | POA: Diagnosis not present

## 2022-08-09 DIAGNOSIS — M6281 Muscle weakness (generalized): Secondary | ICD-10-CM | POA: Diagnosis not present

## 2022-08-09 DIAGNOSIS — R41841 Cognitive communication deficit: Secondary | ICD-10-CM | POA: Diagnosis not present

## 2022-08-12 DIAGNOSIS — Z9181 History of falling: Secondary | ICD-10-CM | POA: Diagnosis not present

## 2022-08-12 DIAGNOSIS — R41841 Cognitive communication deficit: Secondary | ICD-10-CM | POA: Diagnosis not present

## 2022-08-12 DIAGNOSIS — M6281 Muscle weakness (generalized): Secondary | ICD-10-CM | POA: Diagnosis not present

## 2022-08-13 ENCOUNTER — Non-Acute Institutional Stay: Payer: MEDICARE | Admitting: Nurse Practitioner

## 2022-08-13 ENCOUNTER — Encounter: Payer: Self-pay | Admitting: Nurse Practitioner

## 2022-08-13 DIAGNOSIS — D649 Anemia, unspecified: Secondary | ICD-10-CM | POA: Diagnosis not present

## 2022-08-13 DIAGNOSIS — K219 Gastro-esophageal reflux disease without esophagitis: Secondary | ICD-10-CM | POA: Diagnosis not present

## 2022-08-13 DIAGNOSIS — I10 Essential (primary) hypertension: Secondary | ICD-10-CM

## 2022-08-13 DIAGNOSIS — G3184 Mild cognitive impairment, so stated: Secondary | ICD-10-CM

## 2022-08-13 DIAGNOSIS — M159 Polyosteoarthritis, unspecified: Secondary | ICD-10-CM

## 2022-08-13 DIAGNOSIS — I872 Venous insufficiency (chronic) (peripheral): Secondary | ICD-10-CM | POA: Diagnosis not present

## 2022-08-13 DIAGNOSIS — F419 Anxiety disorder, unspecified: Secondary | ICD-10-CM

## 2022-08-13 DIAGNOSIS — F32A Depression, unspecified: Secondary | ICD-10-CM | POA: Diagnosis not present

## 2022-08-13 DIAGNOSIS — K5901 Slow transit constipation: Secondary | ICD-10-CM

## 2022-08-13 NOTE — Progress Notes (Addendum)
Location:  [facility] Room Number: NO/905/A Place of Service:  ALF (13) Provider: Jatavious Peppard X, NP  [name], MD  Patient Care Team: [name], MD as PCP - General (Internal Medicine) [name], MD as Consulting Physician (Orthopedic Surgery)  Extended Emergency Contact Information Primary Emergency Contact: [name] Address: [address]          [place] [address] [name] of [place] Phone: [phone] Relation: Relative Secondary Emergency Contact: Rocky Crafts Address: [address]          [place], [place] [address] [name] of [place] Phone: [phone] Mobile Phone: [phone] Relation: Relative  Code Status:  DNR Goals of care: Advanced Directive information    08/13/2022   10:43 AM  Advanced Directives  Does Patient Have a Medical Advance Directive? Yes  Type of Paramedic of [place];Out of facility DNR (pink MOST or yellow form)  Does patient want to make changes to medical advance directive? No - Patient declined  Copy of [place] in Chart? Yes - validated most recent copy scanned in chart (See row information)  Pre-existing out of facility DNR order (yellow form or pink MOST form) Yellow form placed in chart (order not valid for inpatient use)     Chief Complaint  Patient presents with   Medical Management of Chronic Issues    Patient is here for a follow up for chronic conditions, discuss need for updated vaccines, patient also needs fall screening and 6cit     HPI:  Pt is a 88 y.o. female seen today for managing chronic medical conditions  Anemia, Hgb 12.4 09/25/21             S/p left bipolar hemiarthroplasty 10/12/20 for left hip fracture, healed. left leg/knee sometimes, takes Tylenol             HTN, blood pressure is controlled, on amlodipine Metoprolol, ASA, Bun/creat 14/0.61 05/06/22             CHF/PVD,  peripheral edema is not apparent today, on Furosemide, Cardiology f/u, 2020 EF 50-55%             GERD, stable, on Omeprazole.              Depression/anxiety, lifelong issue, but sleeps/eats at her baseline.              Constipation, takes MiraLax   Mild cognitive impairment, functioning well in [facility]  Past Medical History:  Diagnosis Date   Aortic heart murmur    AS/AI on 2D ECHO ; SBE prophylaxis Rxed   Chest pain 2007 ; 2011   negative cardiac assessment   GERD (gastroesophageal reflux disease)    Hyperlipidemia    Hypertension [   Interstitial cystitis    Resolved   Ocular migraine 04/10/2012   Osteoporosis    T score - 3.0 @ femoral neck; Fosamax affected swallowing   Peripheral vascular disease ([place])    Postmenopausal    No PMH or HRT   Past Surgical History:  Procedure Laterality Date   ANTERIOR APPROACH HEMI HIP ARTHROPLASTY Right 09/03/2019   Procedure: ANTERIOR APPROACH HEMI HIP ARTHROPLASTY BIPOLAR;  Surgeon: [name], MD;  Location: [facility];  Service: Orthopedics;  Laterality: Right;   COLONOSCOPY  2011   Dr Sharlett Iles   HIP ARTHROPLASTY Left 10/11/2020   Procedure: ARTHROPLASTY BIPOLAR  HIP (HEMIARTHROPLASTY) ANTERIOR APPROACH;  Surgeon: [name], MD;  Location: [facility];  Service: Orthopedics;  Laterality: Left;    Allergies  Allergen Reactions   Amitid [Amitriptyline Hcl]     Patient experienced a dizzy, spinning feeling    Metronidazole     REACTION: throat swells   Penicillins     REACTION: rash & fever   Alendronate Sodium     REACTION: unspecified   Amoxicillin Nausea And Vomiting   Clarithromycin     GI intolerance   Flonase [Fluticasone Propionate]     Slight Headache    Promethazine Hcl     REACTION: unspecified   Robitussin (Alcohol Free) [Guaifenesin] Other (See Comments)    unknown hyperactive   Spironolactone     09/21/13 weakness in legs   Singulair [Montelukast Sodium]     "spacey"; ineffective    Outpatient Encounter  Medications as of 08/13/2022  Medication Sig   acetaminophen (TYLENOL) 325 MG tablet Take 650 mg by mouth in the morning and at bedtime. Not to exceed 3000 mg per 24 hours.   acetaminophen (TYLENOL) 500 MG tablet Take 500 mg by mouth every 8 (eight) hours as needed for moderate pain.   amLODipine (NORVASC) 10 MG tablet Take 1 tablet (10 mg total) by mouth daily.   aspirin EC 81 MG tablet Take 81 mg by mouth daily. Swallow whole.   docusate sodium (COLACE) 100 MG capsule Take 1 capsule (100 mg total) by mouth 2 (two) times daily as needed (for constipation).   fexofenadine (ALLEGRA) 180 MG tablet Take 180 mg by mouth daily as needed for allergies or rhinitis.    furosemide (LASIX) 40 MG tablet Take 1 tablet (40 mg total) by mouth daily.   Menthol, Topical Analgesic, (BIOFREEZE) 4 % GEL Apply 1 application topically 2 (two) times daily as needed (left knee pain).    metoprolol succinate (TOPROL-XL) 25 MG 24 hr tablet TAKE 1.5 TABLETS (37.5 MG TOTAL) BY MOUTH DAILY.   Multiple Vitamins-Minerals (MULTIVITAMIN ADULT) CHEW Chew 400 mg by mouth daily.   omeprazole (PRILOSEC) 20 MG capsule Take 20 mg by mouth daily.   polyethylene glycol (MIRALAX / GLYCOLAX) 17 g packet Take 17 g by mouth daily.   potassium chloride SA (KLOR-CON) 20 MEQ tablet Take 20 mEq by mouth daily.   No facility-administered encounter medications on file as of 08/13/2022.    Review of Systems  Constitutional:  Negative for appetite change, fatigue and fever.  HENT:  Positive for hearing loss. Negative for congestion and trouble swallowing.   Eyes:  Negative for visual disturbance.  Respiratory:  Negative for cough, shortness of breath and wheezing.   Cardiovascular:  Negative for leg swelling.  Gastrointestinal:  Negative for abdominal pain and constipation.       GERD  Genitourinary:  Negative for dysuria, frequency and urgency.       Increased nocturnal urinary frequency, 6x/last night. .   Musculoskeletal:  Positive for  arthralgias and gait problem.       Knee pain, mostly in the left, ambulates with walker   Skin:  Negative for color change.  Neurological:  Negative for speech difficulty, weakness and light-headedness.       Memory lapses.   Psychiatric/Behavioral:  Negative for behavioral problems and sleep disturbance. The patient is not nervous/anxious.     Immunization History  Administered Date(s) Administered   Fluad Quad(high Dose 65+) 10/05/2019   Influenza Split 12/10/2011, 09/29/2012   Influenza, High Dose Seasonal PF 09/21/2014,  08/27/2016, 08/27/2017, 09/23/2018   Influenza,inj,Quad PF,6+ Mos 10/08/2013, 09/12/2015   Influenza-Unspecified 10/27/2020, 10/18/2021   Moderna Sars-Covid-2 Vaccination 11/07/2020   PFIZER(Purple Top)SARS-COV-2 Vaccination 02/28/2020, 03/29/2020, 11/07/2020, 05/29/2021, 09/18/2021   Pneumococcal Conjugate-13 08/22/2015   Pneumococcal Polysaccharide-23 11/23/2008   Td 03/23/2004   Tdap 07/12/2015   Pertinent  Health Maintenance Due  Topic Date Due   INFLUENZA VACCINE  07/30/2022   DEXA SCAN  Completed      10/11/2020    8:00 PM 10/12/2020    9:00 AM 10/13/2020    1:00 AM 10/13/2020    8:15 AM 04/08/2021    2:44 PM  Fall Risk  Patient Fall Risk Level _0    Functional Status Survey:    Vitals:   08/13/22 1042  BP: 132/72  Pulse: 81  Resp: 16  Temp: (!) 96.7 F (35.9 C)  SpO2: 97%  Weight: 141 lb (64 kg)  Height: _1  (1.626 m)   Body mass index is 24.2 kg/m. Physical Exam  Labs reviewed: Recent Labs    09/25/21 0000 05/06/22 1137  NA 139 138  K 4.3 4.2  CL 105 99  CO2 26* 23  GLUCOSE  --  92  BUN 17 14  CREATININE 0.6 0.61  CALCIUM 9.5 9.7   Recent Labs    09/25/21 0000  AST 12*  ALT 13  ALKPHOS 80  ALBUMIN 3.9   Recent Labs    09/25/21 0000  WBC 5.9  NEUTROABS 3,723.00  HGB 12.4  HCT 37  PLT 263   Lab Results  Component Value Date   TSH  1.90 05/11/2020   Lab Results  Component Value Date   HGBA1C 5.5 07/12/2015   Lab Results  Component Value Date   CHOL 227 (H) 06/24/2013   HDL 89.10 06/24/2013   LDLDIRECT 114.7 06/24/2013   TRIG 80.0 06/24/2013   CHOLHDL 3 06/24/2013    Significant Diagnostic Results in last 30 days:  No results found.  Assessment/Plan Mild cognitive impairment Functioning well in [facility], staff reported the patient is less out of her room than usual, needs more cueing throughout day. Will update MMSE, CBC/diff, CMP/eGFR, lipid panel, Vit B12, TSH 08/22/22 LDL 114, Na 137, K 4.3, Bun 14, creat 0.60, TSH 1.53, Vit B12 562, wbc 6.4, Hb 12.9, plt 259, neutrophils 60  ANEMIA, MILD Hgb 12.4 09/25/21, update CBC/diff.   Osteoarthritis, multiple sites   S/p left bipolar hemiarthroplasty 10/12/20 for left hip fracture, healed. left leg/knee sometimes, takes Tylenol Ambulates with walker.   Essential hypertension blood pressure is controlled, on amlodipine Metoprolol, ASA, Bun/creat 14/0.61 05/06/22  Venous (peripheral) insufficiency  CHF/PVD, peripheral edema is not apparent today, on Furosemide, Cardiology f/u  Esophageal reflux stable, on Omeprazole.   Anxiety and depression  lifelong issue, but sleeps/eats at her baseline.   Slow transit constipation Stable, takes Cytogeneticist Communication: plan of care reviewed with the patient and charge nurse.   Labs/tests ordered:  MMSE, CBC/diff, CMP/eGFR, TSH, Vit B12, lipid panel.   Time spend 40 minutes.

## 2022-08-14 DIAGNOSIS — R41841 Cognitive communication deficit: Secondary | ICD-10-CM | POA: Diagnosis not present

## 2022-08-14 DIAGNOSIS — Z9181 History of falling: Secondary | ICD-10-CM | POA: Diagnosis not present

## 2022-08-14 DIAGNOSIS — M6281 Muscle weakness (generalized): Secondary | ICD-10-CM | POA: Diagnosis not present

## 2022-08-16 DIAGNOSIS — R41841 Cognitive communication deficit: Secondary | ICD-10-CM | POA: Diagnosis not present

## 2022-08-16 DIAGNOSIS — M6281 Muscle weakness (generalized): Secondary | ICD-10-CM | POA: Diagnosis not present

## 2022-08-16 DIAGNOSIS — Z9181 History of falling: Secondary | ICD-10-CM | POA: Diagnosis not present

## 2022-08-19 DIAGNOSIS — R41841 Cognitive communication deficit: Secondary | ICD-10-CM | POA: Diagnosis not present

## 2022-08-19 DIAGNOSIS — M6281 Muscle weakness (generalized): Secondary | ICD-10-CM | POA: Diagnosis not present

## 2022-08-19 DIAGNOSIS — Z9181 History of falling: Secondary | ICD-10-CM | POA: Diagnosis not present

## 2022-08-20 ENCOUNTER — Encounter: Payer: Self-pay | Admitting: Nurse Practitioner

## 2022-08-20 DIAGNOSIS — G3184 Mild cognitive impairment, so stated: Secondary | ICD-10-CM | POA: Insufficient documentation

## 2022-08-20 NOTE — Assessment & Plan Note (Signed)
Hgb 12.4 09/25/21, update CBC/diff.

## 2022-08-20 NOTE — Assessment & Plan Note (Signed)
stable, on Omeprazole.  

## 2022-08-20 NOTE — Assessment & Plan Note (Addendum)
Functioning well in [facility], staff reported the patient is less out of her room than usual, needs more cueing throughout day. Will update MMSE, CBC/diff, CMP/eGFR, lipid panel, Vit B12, TSH 08/22/22 LDL 114, Na 137, K 4.3, Bun 14, creat 0.60, TSH 1.53, Vit B12 562, wbc 6.4, Hb 12.9, plt 259, neutrophils 60

## 2022-08-20 NOTE — Assessment & Plan Note (Signed)
blood pressure is controlled, on amlodipine Metoprolol, ASA, Bun/creat 14/0.61 05/06/22

## 2022-08-20 NOTE — Assessment & Plan Note (Signed)
S/p left bipolar hemiarthroplasty 10/12/20 for left hip fracture, healed. left leg/knee sometimes, takes Tylenol Ambulates with walker.

## 2022-08-20 NOTE — Assessment & Plan Note (Signed)
CHF/PVD, peripheral edema is not apparent today, on Furosemide, Cardiology f/u

## 2022-08-20 NOTE — Assessment & Plan Note (Signed)
Stable, takes MiraLax  

## 2022-08-20 NOTE — Assessment & Plan Note (Signed)
lifelong issue, but sleeps/eats at her baseline.  

## 2022-08-22 DIAGNOSIS — E785 Hyperlipidemia, unspecified: Secondary | ICD-10-CM | POA: Diagnosis not present

## 2022-08-22 DIAGNOSIS — Z7901 Long term (current) use of anticoagulants: Secondary | ICD-10-CM | POA: Diagnosis not present

## 2022-08-22 DIAGNOSIS — Z9181 History of falling: Secondary | ICD-10-CM | POA: Diagnosis not present

## 2022-08-22 DIAGNOSIS — M6281 Muscle weakness (generalized): Secondary | ICD-10-CM | POA: Diagnosis not present

## 2022-08-22 DIAGNOSIS — R41841 Cognitive communication deficit: Secondary | ICD-10-CM | POA: Diagnosis not present

## 2022-08-22 DIAGNOSIS — I10 Essential (primary) hypertension: Secondary | ICD-10-CM | POA: Diagnosis not present

## 2022-08-22 LAB — BASIC METABOLIC PANEL
BUN: 14 (ref 4–21)
CO2: 29 — AB (ref 13–22)
Chloride: 102 (ref 99–108)
Creatinine: 0.6 (ref 0.5–1.1)
Glucose: 92
Potassium: 4.3 mEq/L (ref 3.5–5.1)
Sodium: 137 (ref 137–147)

## 2022-08-22 LAB — COMPREHENSIVE METABOLIC PANEL
Albumin: 4 (ref 3.5–5.0)
Calcium: 9.5 (ref 8.7–10.7)
Globulin: 1.6
eGFR: 86

## 2022-08-22 LAB — HEPATIC FUNCTION PANEL
ALT: 14 U/L (ref 7–35)
AST: 10 — AB (ref 13–35)
Alkaline Phosphatase: 89 (ref 25–125)
Bilirubin, Total: 0.5

## 2022-08-22 LAB — LIPID PANEL
Cholesterol: 211 — AB (ref 0–200)
HDL: 27 — AB (ref 35–70)
LDL Cholesterol: 114
Triglycerides: 110 (ref 40–160)

## 2022-08-22 LAB — CBC AND DIFFERENTIAL
HCT: 38 (ref 36–46)
Hemoglobin: 12.9 (ref 12.0–16.0)
Neutrophils Absolute: 3840
Platelets: 259 10*3/uL (ref 150–400)
WBC: 6.4

## 2022-08-22 LAB — VITAMIN B12: Vitamin B-12: 562

## 2022-08-22 LAB — CBC: RBC: 4.02 (ref 3.87–5.11)

## 2022-08-22 LAB — TSH: TSH: 1.53 (ref 0.41–5.90)

## 2022-08-26 DIAGNOSIS — Z9181 History of falling: Secondary | ICD-10-CM | POA: Diagnosis not present

## 2022-08-26 DIAGNOSIS — R41841 Cognitive communication deficit: Secondary | ICD-10-CM | POA: Diagnosis not present

## 2022-08-26 DIAGNOSIS — M6281 Muscle weakness (generalized): Secondary | ICD-10-CM | POA: Diagnosis not present

## 2022-08-27 DIAGNOSIS — M6281 Muscle weakness (generalized): Secondary | ICD-10-CM | POA: Diagnosis not present

## 2022-08-27 DIAGNOSIS — R41841 Cognitive communication deficit: Secondary | ICD-10-CM | POA: Diagnosis not present

## 2022-08-27 DIAGNOSIS — Z9181 History of falling: Secondary | ICD-10-CM | POA: Diagnosis not present

## 2022-08-29 DIAGNOSIS — M6281 Muscle weakness (generalized): Secondary | ICD-10-CM | POA: Diagnosis not present

## 2022-08-29 DIAGNOSIS — R41841 Cognitive communication deficit: Secondary | ICD-10-CM | POA: Diagnosis not present

## 2022-08-29 DIAGNOSIS — Z9181 History of falling: Secondary | ICD-10-CM | POA: Diagnosis not present

## 2022-09-02 DIAGNOSIS — M6281 Muscle weakness (generalized): Secondary | ICD-10-CM | POA: Diagnosis not present

## 2022-09-02 DIAGNOSIS — R41841 Cognitive communication deficit: Secondary | ICD-10-CM | POA: Diagnosis not present

## 2022-09-02 DIAGNOSIS — Z9181 History of falling: Secondary | ICD-10-CM | POA: Diagnosis not present

## 2022-09-03 DIAGNOSIS — M6281 Muscle weakness (generalized): Secondary | ICD-10-CM | POA: Diagnosis not present

## 2022-09-03 DIAGNOSIS — R41841 Cognitive communication deficit: Secondary | ICD-10-CM | POA: Diagnosis not present

## 2022-09-03 DIAGNOSIS — Z9181 History of falling: Secondary | ICD-10-CM | POA: Diagnosis not present

## 2022-09-04 DIAGNOSIS — Z9181 History of falling: Secondary | ICD-10-CM | POA: Diagnosis not present

## 2022-09-04 DIAGNOSIS — R41841 Cognitive communication deficit: Secondary | ICD-10-CM | POA: Diagnosis not present

## 2022-09-04 DIAGNOSIS — M6281 Muscle weakness (generalized): Secondary | ICD-10-CM | POA: Diagnosis not present

## 2022-09-05 DIAGNOSIS — M6281 Muscle weakness (generalized): Secondary | ICD-10-CM | POA: Diagnosis not present

## 2022-09-05 DIAGNOSIS — Z9181 History of falling: Secondary | ICD-10-CM | POA: Diagnosis not present

## 2022-09-05 DIAGNOSIS — R41841 Cognitive communication deficit: Secondary | ICD-10-CM | POA: Diagnosis not present

## 2022-09-06 DIAGNOSIS — R41841 Cognitive communication deficit: Secondary | ICD-10-CM | POA: Diagnosis not present

## 2022-09-06 DIAGNOSIS — Z9181 History of falling: Secondary | ICD-10-CM | POA: Diagnosis not present

## 2022-09-06 DIAGNOSIS — M6281 Muscle weakness (generalized): Secondary | ICD-10-CM | POA: Diagnosis not present

## 2022-09-09 DIAGNOSIS — M6281 Muscle weakness (generalized): Secondary | ICD-10-CM | POA: Diagnosis not present

## 2022-09-09 DIAGNOSIS — Z9181 History of falling: Secondary | ICD-10-CM | POA: Diagnosis not present

## 2022-09-09 DIAGNOSIS — R41841 Cognitive communication deficit: Secondary | ICD-10-CM | POA: Diagnosis not present

## 2022-09-10 DIAGNOSIS — M6281 Muscle weakness (generalized): Secondary | ICD-10-CM | POA: Diagnosis not present

## 2022-09-10 DIAGNOSIS — Z9181 History of falling: Secondary | ICD-10-CM | POA: Diagnosis not present

## 2022-09-10 DIAGNOSIS — R41841 Cognitive communication deficit: Secondary | ICD-10-CM | POA: Diagnosis not present

## 2022-09-11 DIAGNOSIS — Z9181 History of falling: Secondary | ICD-10-CM | POA: Diagnosis not present

## 2022-09-11 DIAGNOSIS — M6281 Muscle weakness (generalized): Secondary | ICD-10-CM | POA: Diagnosis not present

## 2022-09-11 DIAGNOSIS — R41841 Cognitive communication deficit: Secondary | ICD-10-CM | POA: Diagnosis not present

## 2022-09-12 DIAGNOSIS — M6281 Muscle weakness (generalized): Secondary | ICD-10-CM | POA: Diagnosis not present

## 2022-09-12 DIAGNOSIS — Z9181 History of falling: Secondary | ICD-10-CM | POA: Diagnosis not present

## 2022-09-12 DIAGNOSIS — R41841 Cognitive communication deficit: Secondary | ICD-10-CM | POA: Diagnosis not present

## 2022-09-13 DIAGNOSIS — R41841 Cognitive communication deficit: Secondary | ICD-10-CM | POA: Diagnosis not present

## 2022-09-13 DIAGNOSIS — Z9181 History of falling: Secondary | ICD-10-CM | POA: Diagnosis not present

## 2022-09-13 DIAGNOSIS — M6281 Muscle weakness (generalized): Secondary | ICD-10-CM | POA: Diagnosis not present

## 2022-09-17 DIAGNOSIS — R41841 Cognitive communication deficit: Secondary | ICD-10-CM | POA: Diagnosis not present

## 2022-09-17 DIAGNOSIS — Z9181 History of falling: Secondary | ICD-10-CM | POA: Diagnosis not present

## 2022-09-17 DIAGNOSIS — M6281 Muscle weakness (generalized): Secondary | ICD-10-CM | POA: Diagnosis not present

## 2022-09-18 DIAGNOSIS — M6281 Muscle weakness (generalized): Secondary | ICD-10-CM | POA: Diagnosis not present

## 2022-09-18 DIAGNOSIS — Z9181 History of falling: Secondary | ICD-10-CM | POA: Diagnosis not present

## 2022-09-18 DIAGNOSIS — R41841 Cognitive communication deficit: Secondary | ICD-10-CM | POA: Diagnosis not present

## 2022-09-19 DIAGNOSIS — M6281 Muscle weakness (generalized): Secondary | ICD-10-CM | POA: Diagnosis not present

## 2022-09-19 DIAGNOSIS — R41841 Cognitive communication deficit: Secondary | ICD-10-CM | POA: Diagnosis not present

## 2022-09-19 DIAGNOSIS — Z9181 History of falling: Secondary | ICD-10-CM | POA: Diagnosis not present

## 2022-09-23 DIAGNOSIS — Z9181 History of falling: Secondary | ICD-10-CM | POA: Diagnosis not present

## 2022-09-23 DIAGNOSIS — R41841 Cognitive communication deficit: Secondary | ICD-10-CM | POA: Diagnosis not present

## 2022-09-23 DIAGNOSIS — M6281 Muscle weakness (generalized): Secondary | ICD-10-CM | POA: Diagnosis not present

## 2022-09-25 DIAGNOSIS — Z9181 History of falling: Secondary | ICD-10-CM | POA: Diagnosis not present

## 2022-09-25 DIAGNOSIS — R41841 Cognitive communication deficit: Secondary | ICD-10-CM | POA: Diagnosis not present

## 2022-09-25 DIAGNOSIS — M6281 Muscle weakness (generalized): Secondary | ICD-10-CM | POA: Diagnosis not present

## 2022-09-26 DIAGNOSIS — R41841 Cognitive communication deficit: Secondary | ICD-10-CM | POA: Diagnosis not present

## 2022-09-26 DIAGNOSIS — M6281 Muscle weakness (generalized): Secondary | ICD-10-CM | POA: Diagnosis not present

## 2022-09-26 DIAGNOSIS — Z9181 History of falling: Secondary | ICD-10-CM | POA: Diagnosis not present

## 2022-10-30 DIAGNOSIS — R262 Difficulty in walking, not elsewhere classified: Secondary | ICD-10-CM | POA: Diagnosis not present

## 2022-10-30 DIAGNOSIS — M25572 Pain in left ankle and joints of left foot: Secondary | ICD-10-CM | POA: Diagnosis not present

## 2022-10-30 DIAGNOSIS — M6281 Muscle weakness (generalized): Secondary | ICD-10-CM | POA: Diagnosis not present

## 2022-11-04 DIAGNOSIS — M6281 Muscle weakness (generalized): Secondary | ICD-10-CM | POA: Diagnosis not present

## 2022-11-04 DIAGNOSIS — R262 Difficulty in walking, not elsewhere classified: Secondary | ICD-10-CM | POA: Diagnosis not present

## 2022-11-04 DIAGNOSIS — M25572 Pain in left ankle and joints of left foot: Secondary | ICD-10-CM | POA: Diagnosis not present

## 2022-11-08 ENCOUNTER — Non-Acute Institutional Stay: Payer: MEDICARE | Admitting: Nurse Practitioner

## 2022-11-08 ENCOUNTER — Encounter: Payer: Self-pay | Admitting: Nurse Practitioner

## 2022-11-08 DIAGNOSIS — M25572 Pain in left ankle and joints of left foot: Secondary | ICD-10-CM | POA: Diagnosis not present

## 2022-11-08 DIAGNOSIS — D649 Anemia, unspecified: Secondary | ICD-10-CM

## 2022-11-08 DIAGNOSIS — G3184 Mild cognitive impairment, so stated: Secondary | ICD-10-CM | POA: Diagnosis not present

## 2022-11-08 DIAGNOSIS — I872 Venous insufficiency (chronic) (peripheral): Secondary | ICD-10-CM | POA: Diagnosis not present

## 2022-11-08 DIAGNOSIS — F419 Anxiety disorder, unspecified: Secondary | ICD-10-CM

## 2022-11-08 DIAGNOSIS — F32A Depression, unspecified: Secondary | ICD-10-CM | POA: Diagnosis not present

## 2022-11-08 DIAGNOSIS — K219 Gastro-esophageal reflux disease without esophagitis: Secondary | ICD-10-CM | POA: Diagnosis not present

## 2022-11-08 DIAGNOSIS — K5901 Slow transit constipation: Secondary | ICD-10-CM

## 2022-11-08 DIAGNOSIS — I10 Essential (primary) hypertension: Secondary | ICD-10-CM | POA: Diagnosis not present

## 2022-11-08 DIAGNOSIS — M159 Polyosteoarthritis, unspecified: Secondary | ICD-10-CM

## 2022-11-08 DIAGNOSIS — E559 Vitamin D deficiency, unspecified: Secondary | ICD-10-CM

## 2022-11-08 DIAGNOSIS — M79672 Pain in left foot: Secondary | ICD-10-CM | POA: Diagnosis not present

## 2022-11-08 NOTE — Progress Notes (Addendum)
Location:  [facility] Room Number: NO/905/A Place of Service:  ALF (13) Provider:  Diavian Furgason X, NP [name], MD  Patient Care Team: [name], MD as PCP - General (Internal Medicine) [name], MD as Consulting Physician (Orthopedic Surgery)  Extended Emergency Contact Information Primary Emergency Contact: [name] Address: [address]          [place] [address] [name] of [place] Phone: [phone] Relation: Relative Secondary Emergency Contact: Rocky Crafts Address: [address]          [place],  [address] [name] of [place] Phone: [phone] Mobile Phone: [phone] Relation: Relative  Code Status:  DNR Goals of care: Advanced Directive information    08/13/2022   10:43 AM  Advanced Directives  Does Patient Have a Medical Advance Directive? Yes  Type of Paramedic of [place];Out of facility DNR (pink MOST or yellow form)  Does patient want to make changes to medical advance directive? No - Patient declined  Copy of [place] in Chart? Yes - validated most recent copy scanned in chart (See row information)  Pre-existing out of facility DNR order (yellow form or pink MOST form) Yellow form placed in chart (order not valid for inpatient use)     Chief Complaint  Patient presents with   Acute Visit    Patient is being seen for left ankle pain    HPI:  Pt is a 88 y.o. female seen today for an acute visit for c/o lateral left foot/ankle below the lateral malleolus, aches, not new, worsens when walking, mild warmth and swelling noted. The patient declined po pain meds, desires Lidoderm patch for trial.      Anemia, Hgb 12.9 08/22/22             S/p left bipolar hemiarthroplasty 10/12/20 for left hip fracture, healed. left leg/knee sometimes, takes Tylenol             HTN, blood pressure is controlled, on amlodipine  Metoprolol, Bun/creat 14/0.6 08/22/22             CHF/PVD, peripheral edema is mild left foot, on Furosemide 60m qd. Cardiology f/u             GERD, stable, on Omeprazole.              Depression/anxiety, lifelong issue, but sleeps/eats at her baseline.              Constipation, takes MiraLax   Cognitive impairment, functioning well [facility], TSH 1.53, Vit B12 562 08/22/22  Past Medical History:  Diagnosis Date   Aortic heart murmur    AS/AI on 2D ECHO ; SBE prophylaxis Rxed   Chest pain 2007 ; 2011   negative cardiac assessment   GERD (gastroesophageal reflux disease)    Hyperlipidemia    Hypertension [   Interstitial cystitis    Resolved   Ocular migraine 04/10/2012   Osteoporosis    T score - 3.0 @ femoral neck; Fosamax affected swallowing   Peripheral vascular disease ([place]    Postmenopausal    No PMH or HRT   Past Surgical History:  Procedure Laterality Date   ANTERIOR APPROACH HEMI HIP ARTHROPLASTY Right 09/03/2019   Procedure: ANTERIOR APPROACH HEMI HIP ARTHROPLASTY BIPOLAR;  Surgeon: GDorna Leitz MD;  Location: [facility];  Service: Orthopedics;  Laterality: Right;  COLONOSCOPY  2011   Dr Sharlett Iles   HIP ARTHROPLASTY Left 10/11/2020   Procedure: ARTHROPLASTY BIPOLAR HIP (HEMIARTHROPLASTY) ANTERIOR APPROACH;  Surgeon: [name], MD;  Location: [facility];  Service: Orthopedics;  Laterality: Left;    Allergies  Allergen Reactions   Amitid [Amitriptyline Hcl]     Patient experienced a dizzy, spinning feeling    Metronidazole     REACTION: throat swells   Penicillins     REACTION: rash & fever   Alendronate Sodium     REACTION: unspecified   Amoxicillin Nausea And Vomiting   Clarithromycin     GI intolerance   Flonase [Fluticasone Propionate]     Slight Headache    Promethazine Hcl     REACTION: unspecified   Robitussin (Alcohol Free) [Guaifenesin] Other (See Comments)    unknown hyperactive   Spironolactone     09/21/13 weakness in legs   Singulair [Montelukast  Sodium]     "spacey"; ineffective    Outpatient Encounter Medications as of 11/08/2022  Medication Sig   acetaminophen (TYLENOL) 325 MG tablet Take 650 mg by mouth in the morning and at bedtime. Not to exceed 3000 mg per 24 hours.   acetaminophen (TYLENOL) 500 MG tablet Take 500 mg by mouth every 8 (eight) hours as needed for moderate pain.   amLODipine (NORVASC) 10 MG tablet Take 1 tablet (10 mg total) by mouth daily.   aspirin EC 81 MG tablet Take 81 mg by mouth daily. Swallow whole.   docusate sodium (COLACE) 100 MG capsule Take 1 capsule (100 mg total) by mouth 2 (two) times daily as needed (for constipation).   fexofenadine (ALLEGRA) 180 MG tablet Take 180 mg by mouth daily as needed for allergies or rhinitis.    furosemide (LASIX) 40 MG tablet Take 1 tablet (40 mg total) by mouth daily.   Menthol, Topical Analgesic, (BIOFREEZE) 4 % GEL Apply 1 application topically 2 (two) times daily as needed (left knee pain).    metoprolol succinate (TOPROL-XL) 25 MG 24 hr tablet TAKE 1.5 TABLETS (37.5 MG TOTAL) BY MOUTH DAILY.   Multiple Vitamins-Minerals (MULTIVITAMIN ADULT) CHEW Chew 400 mg by mouth daily.   omeprazole (PRILOSEC) 20 MG capsule Take 20 mg by mouth daily.   polyethylene glycol (MIRALAX / GLYCOLAX) 17 g packet Take 17 g by mouth daily.   potassium chloride SA (KLOR-CON) 20 MEQ tablet Take 20 mEq by mouth daily.   No facility-administered encounter medications on file as of 11/08/2022.    Review of Systems  Constitutional:  Negative for appetite change, fatigue and fever.  HENT:  Positive for hearing loss. Negative for congestion and trouble swallowing.   Eyes:  Negative for visual disturbance.  Respiratory:  Negative for cough, shortness of breath and wheezing.   Cardiovascular:  Positive for leg swelling.  Gastrointestinal:  Negative for abdominal pain and constipation.       GERD  Genitourinary:  Negative for dysuria, frequency and urgency.       Increased nocturnal urinary  frequency, 6x/last night. .   Musculoskeletal:  Positive for arthralgias and gait problem.       Knee pain, mostly in the left, ambulates with walker. C/o left ankle pain.   Skin:  Negative for color change.  Neurological:  Negative for speech difficulty, weakness and light-headedness.       Memory lapses.   Psychiatric/Behavioral:  Negative for behavioral problems and sleep disturbance. The patient is not nervous/anxious.     Immunization History  Administered Date(s)  Administered   Fluad Quad(high Dose 65+) 10/05/2019, 10/21/2022   Influenza Split 12/10/2011, 09/29/2012   Influenza, High Dose Seasonal PF 09/21/2014, 08/27/2016, 08/27/2017, 09/23/2018   Influenza,inj,Quad PF,6+ Mos 10/08/2013, 09/12/2015   Influenza-Unspecified 10/27/2020, 10/18/2021   Moderna Sars-Covid-2 Vaccination 11/07/2020   PFIZER(Purple Top)SARS-COV-2 Vaccination 02/28/2020, 03/29/2020, 11/07/2020, 05/29/2021, 09/18/2021   Pfizer Covid-19 Vaccine Bivalent Booster 20yr & up 10/31/2022   Pneumococcal Conjugate-13 08/22/2015   Pneumococcal Polysaccharide-23 11/23/2008   Td 03/23/2004   Tdap 07/12/2015   Zoster Recombinat (Shingrix) 01/01/2022, 03/26/2022   Pertinent  Health Maintenance Due  Topic Date Due   INFLUENZA VACCINE  Completed   DEXA SCAN  Completed      10/11/2020    8:00 PM 10/12/2020    9:00 AM 10/13/2020    1:00 AM 10/13/2020    8:15 AM 04/08/2021    2:44 PM  Fall Risk  Patient Fall Risk Level _0    Functional Status Survey:    Vitals:   11/08/22 1104  BP: 121/68  Pulse: 78  Resp: 18  Temp: (!) 97.5 F (36.4 C)  SpO2: 95%  Weight: 139 lb (63 kg)  Height: _1  (1.626 m)   Body mass index is 23.86 kg/m. Physical Exam Vitals and nursing note reviewed.  Constitutional:      Comments: Generalized weakness  HENT:     Head: Normocephalic and atraumatic.     Nose: Congestion and rhinorrhea present.      Mouth/Throat:     Mouth: Mucous membranes are dry.     Pharynx: No oropharyngeal exudate or posterior oropharyngeal erythema.  Eyes:     Extraocular Movements: Extraocular movements intact.     Conjunctiva/sclera: Conjunctivae normal.     Pupils: Pupils are equal, round, and reactive to light.  Cardiovascular:     Rate and Rhythm: Normal rate.     Heart sounds: No murmur heard.    Comments: HR 100 bpm Pulmonary:     Effort: Pulmonary effort is normal.     Breath sounds: No wheezing, rhonchi or rales.  Abdominal:     General: Bowel sounds are normal.     Palpations: Abdomen is soft.     Tenderness: There is no abdominal tenderness.  Musculoskeletal:        General: Tenderness present. No deformity or signs of injury.     Cervical back: Normal range of motion and neck supple.     Right lower leg: No edema.     Left lower leg: Edema present.     Comments: lateral left foot/ankle below the lateral malleolus, aches, not new, worsens when walking, mild warmth and swelling noted.   Skin:    General: Skin is warm and dry.     Findings: No bruising.     Comments: No cord findings or pain in R+L calf.  Neurological:     General: No focal deficit present.     Mental Status: She is alert and oriented to person, place, and time. Mental status is at baseline.     Motor: No weakness.     Coordination: Coordination normal.     Gait: Gait abnormal.  Psychiatric:        Mood and Affect: Mood normal.        Behavior: Behavior normal.        Thought Content: Thought content normal.        Judgment: Judgment normal.  Comments: Admitted feeling depressed or sad at times, but declined antidepressant, stated she sleeps well, has supportive family, is happy at [facility].      Labs reviewed: Recent Labs    05/06/22 1137  NA 138  K 4.2  CL 99  CO2 23  GLUCOSE 92  BUN 14  CREATININE 0.61  CALCIUM 9.7   No results for input(s): "AST", "ALT", "ALKPHOS", "BILITOT", "PROT", "ALBUMIN"  in the last 8760 hours. No results for input(s): "WBC", "NEUTROABS", "HGB", "HCT", "MCV", "PLT" in the last 8760 hours. Lab Results  Component Value Date   TSH 1.90 05/11/2020   Lab Results  Component Value Date   HGBA1C 5.5 07/12/2015   Lab Results  Component Value Date   CHOL 227 (H) 06/24/2013   HDL 89.10 06/24/2013   LDLDIRECT 114.7 06/24/2013   TRIG 80.0 06/24/2013   CHOLHDL 3 06/24/2013    Significant Diagnostic Results in last 30 days:  No results found.  Assessment/Plan Osteoarthritis, multiple sites   c/o lateral left foot/ankle below the lateral malleolus, aches, no new, worsens when walking, mild warmth and swelling noted. The patient declined po pain meds, desires Lidoderm patch for trial.    S/p left bipolar hemiarthroplasty 10/12/20 for left hip fracture, healed. left leg/knee sometimes, takes Tylenol  May consider X-ray if no better, obtain uric acid, ESR, CRP, Vit D, X-ray left ankle/foot 3 views  11/08/22 X-ray left ankle/foot no acute fx  01/02/23 Na 139, K 4.5, Bun 13, creat 0.45, Uric acid 4.3, ESR 2, wbc 6.1, Hgb 12.4, plt 295, neutrophils 66.7, , CRP 0.7, Vit D 23  ANEMIA, MILD update CBC/diff.   Essential hypertension blood pressure is controlled, on amlodipine Metoprolol, update CMP/eGFR  Venous (peripheral) insufficiency peripheral edema is mild left foot, on Furosemide 1m qd. Cardiology f/u  Esophageal reflux  stable, on Omeprazole.   Anxiety and depression  lifelong issue, but sleeps/eats at her baseline.   Slow transit constipation Stable, takes MiraLax   Mild cognitive impairment functioning well [facility], TSH 1.53, Vit B12 562 08/22/22  Vitamin D deficiency 01/02/23 Vit D 23, adding Vit D 2000u po daily.      Family/ staff Communication: plan of care reviewed with the patient and charge nurse.   Labs/tests ordered: CBC/diff, CMP/eGFR, uric acid, ESR, CRP, X-ray left ankle/foot 3 views  Time spend 40 minutes.

## 2022-11-08 NOTE — Assessment & Plan Note (Signed)
Stable, takes MiraLax

## 2022-11-08 NOTE — Assessment & Plan Note (Signed)
stable, on Omeprazole.

## 2022-11-08 NOTE — Assessment & Plan Note (Signed)
functioning well [facility], TSH 1.53, Vit B12 562 08/22/22

## 2022-11-08 NOTE — Assessment & Plan Note (Addendum)
blood pressure is controlled, on amlodipine Metoprolol, update CMP/eGFR

## 2022-11-08 NOTE — Assessment & Plan Note (Addendum)
c/o lateral left foot/ankle below the lateral malleolus, aches, no new, worsens when walking, mild warmth and swelling noted. The patient declined po pain meds, desires Lidoderm patch for trial.    S/p left bipolar hemiarthroplasty 10/12/20 for left hip fracture, healed. left leg/knee sometimes, takes Tylenol  May consider X-ray if no better, obtain uric acid, ESR, CRP, Vit D, X-ray left ankle/foot 3 views  11/08/22 X-ray left ankle/foot no acute fx  01/02/23 Na 139, K 4.5, Bun 13, creat 0.45, Uric acid 4.3, ESR 2, wbc 6.1, Hgb 12.4, plt 295, neutrophils 66.7, , CRP 0.7, Vit D 23

## 2022-11-08 NOTE — Assessment & Plan Note (Signed)
peripheral edema is mild left foot, on Furosemide '40mg'$  qd. Cardiology f/u

## 2022-11-08 NOTE — Assessment & Plan Note (Addendum)
update CBC/diff.

## 2022-11-08 NOTE — Assessment & Plan Note (Signed)
lifelong issue, but sleeps/eats at her baseline.

## 2022-11-15 DIAGNOSIS — R262 Difficulty in walking, not elsewhere classified: Secondary | ICD-10-CM | POA: Diagnosis not present

## 2022-11-15 DIAGNOSIS — M6281 Muscle weakness (generalized): Secondary | ICD-10-CM | POA: Diagnosis not present

## 2022-11-15 DIAGNOSIS — M25572 Pain in left ankle and joints of left foot: Secondary | ICD-10-CM | POA: Diagnosis not present

## 2022-11-18 DIAGNOSIS — M25572 Pain in left ankle and joints of left foot: Secondary | ICD-10-CM | POA: Diagnosis not present

## 2022-11-18 DIAGNOSIS — M6281 Muscle weakness (generalized): Secondary | ICD-10-CM | POA: Diagnosis not present

## 2022-11-18 DIAGNOSIS — R262 Difficulty in walking, not elsewhere classified: Secondary | ICD-10-CM | POA: Diagnosis not present

## 2022-11-19 DIAGNOSIS — R262 Difficulty in walking, not elsewhere classified: Secondary | ICD-10-CM | POA: Diagnosis not present

## 2022-11-19 DIAGNOSIS — M6281 Muscle weakness (generalized): Secondary | ICD-10-CM | POA: Diagnosis not present

## 2022-11-19 DIAGNOSIS — M25572 Pain in left ankle and joints of left foot: Secondary | ICD-10-CM | POA: Diagnosis not present

## 2022-11-22 DIAGNOSIS — M6281 Muscle weakness (generalized): Secondary | ICD-10-CM | POA: Diagnosis not present

## 2022-11-22 DIAGNOSIS — R262 Difficulty in walking, not elsewhere classified: Secondary | ICD-10-CM | POA: Diagnosis not present

## 2022-11-22 DIAGNOSIS — M25572 Pain in left ankle and joints of left foot: Secondary | ICD-10-CM | POA: Diagnosis not present

## 2022-11-25 DIAGNOSIS — R262 Difficulty in walking, not elsewhere classified: Secondary | ICD-10-CM | POA: Diagnosis not present

## 2022-11-25 DIAGNOSIS — M6281 Muscle weakness (generalized): Secondary | ICD-10-CM | POA: Diagnosis not present

## 2022-11-25 DIAGNOSIS — M25572 Pain in left ankle and joints of left foot: Secondary | ICD-10-CM | POA: Diagnosis not present

## 2022-11-27 DIAGNOSIS — M25572 Pain in left ankle and joints of left foot: Secondary | ICD-10-CM | POA: Diagnosis not present

## 2022-11-27 DIAGNOSIS — R262 Difficulty in walking, not elsewhere classified: Secondary | ICD-10-CM | POA: Diagnosis not present

## 2022-11-27 DIAGNOSIS — M6281 Muscle weakness (generalized): Secondary | ICD-10-CM | POA: Diagnosis not present

## 2022-12-02 DIAGNOSIS — R262 Difficulty in walking, not elsewhere classified: Secondary | ICD-10-CM | POA: Diagnosis not present

## 2022-12-02 DIAGNOSIS — M25572 Pain in left ankle and joints of left foot: Secondary | ICD-10-CM | POA: Diagnosis not present

## 2022-12-04 DIAGNOSIS — M25572 Pain in left ankle and joints of left foot: Secondary | ICD-10-CM | POA: Diagnosis not present

## 2022-12-04 DIAGNOSIS — R262 Difficulty in walking, not elsewhere classified: Secondary | ICD-10-CM | POA: Diagnosis not present

## 2022-12-05 DIAGNOSIS — R262 Difficulty in walking, not elsewhere classified: Secondary | ICD-10-CM | POA: Diagnosis not present

## 2022-12-05 DIAGNOSIS — M25572 Pain in left ankle and joints of left foot: Secondary | ICD-10-CM | POA: Diagnosis not present

## 2022-12-06 DIAGNOSIS — R262 Difficulty in walking, not elsewhere classified: Secondary | ICD-10-CM | POA: Diagnosis not present

## 2022-12-06 DIAGNOSIS — M25572 Pain in left ankle and joints of left foot: Secondary | ICD-10-CM | POA: Diagnosis not present

## 2022-12-09 DIAGNOSIS — R262 Difficulty in walking, not elsewhere classified: Secondary | ICD-10-CM | POA: Diagnosis not present

## 2022-12-09 DIAGNOSIS — M25572 Pain in left ankle and joints of left foot: Secondary | ICD-10-CM | POA: Diagnosis not present

## 2022-12-11 DIAGNOSIS — R262 Difficulty in walking, not elsewhere classified: Secondary | ICD-10-CM | POA: Diagnosis not present

## 2022-12-11 DIAGNOSIS — M25572 Pain in left ankle and joints of left foot: Secondary | ICD-10-CM | POA: Diagnosis not present

## 2022-12-13 DIAGNOSIS — R262 Difficulty in walking, not elsewhere classified: Secondary | ICD-10-CM | POA: Diagnosis not present

## 2022-12-13 DIAGNOSIS — M25572 Pain in left ankle and joints of left foot: Secondary | ICD-10-CM | POA: Diagnosis not present

## 2022-12-16 DIAGNOSIS — M25572 Pain in left ankle and joints of left foot: Secondary | ICD-10-CM | POA: Diagnosis not present

## 2022-12-16 DIAGNOSIS — R262 Difficulty in walking, not elsewhere classified: Secondary | ICD-10-CM | POA: Diagnosis not present

## 2022-12-18 DIAGNOSIS — R262 Difficulty in walking, not elsewhere classified: Secondary | ICD-10-CM | POA: Diagnosis not present

## 2022-12-18 DIAGNOSIS — M25572 Pain in left ankle and joints of left foot: Secondary | ICD-10-CM | POA: Diagnosis not present

## 2023-01-02 DIAGNOSIS — M1712 Unilateral primary osteoarthritis, left knee: Secondary | ICD-10-CM | POA: Diagnosis not present

## 2023-01-02 DIAGNOSIS — Z7901 Long term (current) use of anticoagulants: Secondary | ICD-10-CM | POA: Diagnosis not present

## 2023-01-02 DIAGNOSIS — I10 Essential (primary) hypertension: Secondary | ICD-10-CM | POA: Diagnosis not present

## 2023-01-02 DIAGNOSIS — M1000 Idiopathic gout, unspecified site: Secondary | ICD-10-CM | POA: Diagnosis not present

## 2023-01-03 DIAGNOSIS — E559 Vitamin D deficiency, unspecified: Secondary | ICD-10-CM | POA: Insufficient documentation

## 2023-01-03 LAB — CBC AND DIFFERENTIAL
HCT: 37 (ref 36–46)
Hemoglobin: 12.4 (ref 12.0–16.0)
Neutrophils Absolute: 4069
Platelets: 295 10*3/uL (ref 150–400)
WBC: 6.1

## 2023-01-03 LAB — COMPREHENSIVE METABOLIC PANEL
Albumin: 3.8 (ref 3.5–5.0)
Calcium: 9.6 (ref 8.7–10.7)
Globulin: 1.7
eGFR: 91

## 2023-01-03 LAB — HEPATIC FUNCTION PANEL
ALT: 12 U/L (ref 7–35)
AST: 11 — AB (ref 13–35)
Alkaline Phosphatase: 91 (ref 25–125)
Bilirubin, Total: 0.5

## 2023-01-03 LAB — BASIC METABOLIC PANEL
BUN: 13 (ref 4–21)
CO2: 30 — AB (ref 13–22)
Chloride: 103 (ref 99–108)
Creatinine: 0.5 (ref 0.5–1.1)
Glucose: 101
Potassium: 4.5 mEq/L (ref 3.5–5.1)
Sodium: 139 (ref 137–147)

## 2023-01-03 LAB — VITAMIN D 25 HYDROXY (VIT D DEFICIENCY, FRACTURES): Vit D, 25-Hydroxy: 23

## 2023-01-03 LAB — CBC: RBC: 3.98 (ref 3.87–5.11)

## 2023-01-03 NOTE — Assessment & Plan Note (Signed)
01/02/23 Vit D 23, adding Vit D 2000u po daily.

## 2023-01-07 ENCOUNTER — Encounter: Payer: Self-pay | Admitting: Nurse Practitioner

## 2023-01-07 ENCOUNTER — Non-Acute Institutional Stay: Payer: MEDICARE | Admitting: Nurse Practitioner

## 2023-01-07 DIAGNOSIS — I872 Venous insufficiency (chronic) (peripheral): Secondary | ICD-10-CM

## 2023-01-07 DIAGNOSIS — I10 Essential (primary) hypertension: Secondary | ICD-10-CM

## 2023-01-07 DIAGNOSIS — M159 Polyosteoarthritis, unspecified: Secondary | ICD-10-CM | POA: Diagnosis not present

## 2023-01-07 DIAGNOSIS — F419 Anxiety disorder, unspecified: Secondary | ICD-10-CM | POA: Diagnosis not present

## 2023-01-07 DIAGNOSIS — E559 Vitamin D deficiency, unspecified: Secondary | ICD-10-CM | POA: Diagnosis not present

## 2023-01-07 DIAGNOSIS — D649 Anemia, unspecified: Secondary | ICD-10-CM

## 2023-01-07 DIAGNOSIS — K219 Gastro-esophageal reflux disease without esophagitis: Secondary | ICD-10-CM | POA: Diagnosis not present

## 2023-01-07 DIAGNOSIS — K5901 Slow transit constipation: Secondary | ICD-10-CM

## 2023-01-07 DIAGNOSIS — F32A Depression, unspecified: Secondary | ICD-10-CM | POA: Diagnosis not present

## 2023-01-07 DIAGNOSIS — G3184 Mild cognitive impairment, so stated: Secondary | ICD-10-CM

## 2023-01-07 NOTE — Assessment & Plan Note (Signed)
Stable, takes MiraLax

## 2023-01-07 NOTE — Assessment & Plan Note (Signed)
Hgb 12.4 01/03/23

## 2023-01-07 NOTE — Assessment & Plan Note (Signed)
Vit D level 23 01/03/23, started Vit D 2000u qd.

## 2023-01-07 NOTE — Assessment & Plan Note (Signed)
Depression/anxiety, lifelong issue, but sleeps/eats at her baseline. TSH 1.53 08/22/22

## 2023-01-07 NOTE — Assessment & Plan Note (Signed)
peripheral edema is mild left foot, on Furosemide. Cardiology f/u

## 2023-01-07 NOTE — Assessment & Plan Note (Signed)
functioning well [facility], TSH 1.53, Vit B12 562 08/22/22

## 2023-01-07 NOTE — Assessment & Plan Note (Signed)
blood pressure is controlled, on amlodipine Metoprolol, Bun/creat 13/0.5 01/03/23

## 2023-01-07 NOTE — Progress Notes (Signed)
Location:  [facility] Room Number: AL/905/A Place of Service:  ALF (13) Provider:  Saleh Ulbrich X, NP  Patient Care Team: [name], MD as PCP - General (Internal Medicine) [name], MD as Consulting Physician (Orthopedic Surgery)  Extended Emergency Contact Information Primary Emergency Contact: [name] Address: [address]          [place] [address] [name] of [place] Phone: [phone] Relation: Relative Secondary Emergency Contact: Rocky Crafts Address: [address]          [place], [place] [address] [name] of [place] Phone: [phone] Mobile Phone: [phone] Relation: Relative  Code Status:  DNR Goals of care: Advanced Directive information    01/07/2023    9:31 AM  Advanced Directives  Does Patient Have a Medical Advance Directive? Yes  Type of Paramedic of [place];Living will;Out of facility DNR (pink MOST or yellow form)  Does patient want to make changes to medical advance directive? No - Patient declined  Copy of [place] in Chart? Yes - validated most recent copy scanned in chart (See row information)  Pre-existing out of facility DNR order (yellow form or pink MOST form) Pink MOST form placed in chart (order not valid for inpatient use);Yellow form placed in chart (order not valid for inpatient use)     Chief Complaint  Patient presents with   Medical Management of Chronic Issues    Patient is here for a follow up for chronic conditions     HPI:  Pt is a 89 y.o. female seen today for medical management of chronic diseases.     Anemia, Hgb 12.4 01/03/23             OA, S/p left bipolar hemiarthroplasty 10/12/20 for left hip fracture, healed. left leg/knee pain sometimes, takes Tylenol.  Vit D deficiency, Vit D level 23 01/03/23, started Vit D 2000u qd.              HTN, blood pressure is controlled, on amlodipine  Metoprolol, Bun/creat 13/0.5 01/03/23             CHF/PVD, peripheral edema is mild left foot, on Furosemide. Cardiology f/u             GERD, stable, on Omeprazole.              Depression/anxiety, lifelong issue, but sleeps/eats at her baseline. TSH 1.53 08/22/22             Constipation, takes MiraLax              Cognitive impairment, functioning well [facility], TSH 1.53, Vit B12 562 08/22/22 Past Medical History:  Diagnosis Date   Aortic heart murmur    AS/AI on 2D ECHO ; SBE prophylaxis Rxed   Chest pain 2007 ; 2011   negative cardiac assessment   GERD (gastroesophageal reflux disease)    Hyperlipidemia    Hypertension [   Interstitial cystitis    Resolved   Ocular migraine 04/10/2012   Osteoporosis    T score - 3.0 @ femoral neck; Fosamax affected swallowing   Peripheral vascular disease ([place])    Postmenopausal    No PMH or HRT   Past Surgical History:  Procedure Laterality Date   ANTERIOR APPROACH HEMI HIP ARTHROPLASTY Right 09/03/2019   Procedure: ANTERIOR APPROACH HEMI HIP ARTHROPLASTY BIPOLAR;  Surgeon: [name], MD;  Location: [facility];  Service: Orthopedics;  Laterality: Right;   COLONOSCOPY  2011   Dr Sharlett Iles   HIP ARTHROPLASTY Left 10/11/2020   Procedure: ARTHROPLASTY BIPOLAR HIP (HEMIARTHROPLASTY) ANTERIOR APPROACH;  Surgeon: [name], MD;  Location: [facility];  Service: Orthopedics;  Laterality: Left;    Allergies  Allergen Reactions   Amitid [Amitriptyline Hcl]     Patient experienced a dizzy, spinning feeling    Metronidazole     REACTION: throat swells   Penicillins     REACTION: rash & fever   Alendronate Sodium     REACTION: unspecified   Amoxicillin Nausea And Vomiting   Clarithromycin     GI intolerance   Flonase [Fluticasone Propionate]     Slight Headache    Promethazine Hcl     REACTION: unspecified   Robitussin (Alcohol Free) [Guaifenesin] Other (See Comments)    unknown hyperactive   Spironolactone     09/21/13 weakness in legs    Singulair [Montelukast Sodium]     "spacey"; ineffective    Outpatient Encounter Medications as of 01/07/2023  Medication Sig   acetaminophen (TYLENOL) 325 MG tablet Take 650 mg by mouth in the morning and at bedtime. Not to exceed 3000 mg per 24 hours.   acetaminophen (TYLENOL) 500 MG tablet Take 500 mg by mouth every 8 (eight) hours as needed for moderate pain.   amLODipine (NORVASC) 10 MG tablet Take 1 tablet (10 mg total) by mouth daily.   aspirin EC 81 MG tablet Take 81 mg by mouth daily. Swallow whole.   docusate sodium (COLACE) 100 MG capsule Take 1 capsule (100 mg total) by mouth 2 (two) times daily as needed (for constipation).   fexofenadine (ALLEGRA) 180 MG tablet Take 180 mg by mouth daily as needed for allergies or rhinitis.    furosemide (LASIX) 40 MG tablet Take 1 tablet (40 mg total) by mouth daily.   Menthol, Topical Analgesic, (BIOFREEZE) 4 % GEL Apply 1 application topically 2 (two) times daily as needed (left knee pain).    metoprolol succinate (TOPROL-XL) 25 MG 24 hr tablet TAKE 1.5 TABLETS (37.5 MG TOTAL) BY MOUTH DAILY.   Multiple Vitamins-Minerals (MULTIVITAMIN ADULT) CHEW Chew 400 mg by mouth daily.   omeprazole (PRILOSEC) 20 MG capsule Take 20 mg by mouth daily.   polyethylene glycol (MIRALAX / GLYCOLAX) 17 g packet Take 17 g by mouth daily.   potassium chloride SA (KLOR-CON) 20 MEQ tablet Take 20 mEq by mouth daily.   No facility-administered encounter medications on file as of 01/07/2023.    Review of Systems  Constitutional:  Negative for appetite change, fatigue and fever.  HENT:  Positive for hearing loss. Negative for congestion and trouble swallowing.   Eyes:  Negative for visual disturbance.  Respiratory:  Negative for cough, shortness of breath and wheezing.   Cardiovascular:  Positive for leg swelling.  Gastrointestinal:  Negative for abdominal pain and constipation.       GERD  Genitourinary:  Positive for frequency. Negative for dysuria and urgency.   Musculoskeletal:  Positive for arthralgias and gait problem.       Knee pain, mostly in the left, ambulates with walker, better  Skin:  Negative for color change.  Neurological:  Negative for speech difficulty, weakness and light-headedness.       Memory lapses.   Psychiatric/Behavioral:  Negative for behavioral problems and sleep disturbance. The patient is not nervous/anxious.     Immunization History  Administered Date(s) Administered   Fluad Quad(high  Dose 65+) 10/05/2019, 10/21/2022   Influenza Split 12/10/2011, 09/29/2012   Influenza, High Dose Seasonal PF 09/21/2014, 08/27/2016, 08/27/2017, 09/23/2018   Influenza,inj,Quad PF,6+ Mos 10/08/2013, 09/12/2015   Influenza-Unspecified 10/27/2020, 10/18/2021   Moderna Sars-Covid-2 Vaccination 11/07/2020   PFIZER(Purple Top)SARS-COV-2 Vaccination 02/28/2020, 03/29/2020, 11/07/2020, 05/29/2021, 09/18/2021   Pfizer Covid-19 Vaccine Bivalent Booster 34yr & up 10/31/2022   Pneumococcal Conjugate-13 08/22/2015   Pneumococcal Polysaccharide-23 11/23/2008   Td 03/23/2004   Tdap 07/12/2015   Zoster Recombinat (Shingrix) 01/01/2022, 03/26/2022   Pertinent  Health Maintenance Due  Topic Date Due   INFLUENZA VACCINE  Completed   DEXA SCAN  Completed      10/12/2020    9:00 AM 10/13/2020    1:00 AM 10/13/2020    8:15 AM 04/08/2021    2:44 PM 01/07/2023    9:34 AM  Fall Risk  Falls in the past year?     0  Was there an injury with Fall?     0  Fall Risk Category Calculator     0  Fall Risk Category     Low  Patient Fall Risk Level High fall risk High fall risk High fall risk High fall risk Low fall risk  Patient at Risk for Falls Due to     No Fall Risks  Fall risk Follow up     Falls evaluation completed   Functional Status Survey:    Vitals:   01/07/23 0929  BP: (!) 130/58  Pulse: 77  Resp: 18  Temp: 98.5 F (36.9 C)  SpO2: 95%  Weight: 138 lb 3.2 oz (62.7 kg)  Height: '5\' 4"'$  (1.626 m)   Body mass index is 23.72  kg/m. Physical Exam Vitals and nursing note reviewed.  Constitutional:      Appearance: Normal appearance.  HENT:     Head: Normocephalic and atraumatic.     Nose: No congestion or rhinorrhea.     Mouth/Throat:     Mouth: Mucous membranes are moist.  Eyes:     Extraocular Movements: Extraocular movements intact.     Conjunctiva/sclera: Conjunctivae normal.     Pupils: Pupils are equal, round, and reactive to light.  Cardiovascular:     Rate and Rhythm: Normal rate.     Heart sounds: No murmur heard.    Comments: HR 100 bpm Pulmonary:     Effort: Pulmonary effort is normal.     Breath sounds: No rales.  Abdominal:     General: Bowel sounds are normal.     Palpations: Abdomen is soft.     Tenderness: There is no abdominal tenderness.  Musculoskeletal:     Cervical back: Normal range of motion and neck supple.     Right lower leg: No edema.     Left lower leg: Edema present.     Comments: Trace edema LLE  Skin:    General: Skin is warm and dry.  Neurological:     General: No focal deficit present.     Mental Status: She is alert and oriented to person, place, and time. Mental status is at baseline.     Motor: No weakness.     Coordination: Coordination normal.     Gait: Gait abnormal.  Psychiatric:        Mood and Affect: Mood normal.        Behavior: Behavior normal.        Thought Content: Thought content normal.        Judgment: Judgment normal.  Comments: Admitted feeling depressed or sad at times, but declined antidepressant, stated she sleeps well, has supportive family, is happy at [facility].      Labs reviewed: Recent Labs    05/06/22 1137 08/22/22 0000 01/03/23 0000  NA 138 137 139  K 4.2 4.3 4.5  CL 99 102 103  CO2 23 29* 30*  GLUCOSE 92  --   --   BUN '14 14 13  '$ CREATININE 0.61 0.6 0.5  CALCIUM 9.7 9.5 9.6   Recent Labs    08/22/22 0000 01/03/23 0000  AST 10* 11*  ALT 14 12  ALKPHOS 89 91  ALBUMIN 4.0 3.8   Recent Labs     08/22/22 0000 01/03/23 0000  WBC 6.4 6.1  NEUTROABS 3,840.00 4,069.00  HGB 12.9 12.4  HCT 38 37  PLT 259 295   Lab Results  Component Value Date   TSH 1.53 08/22/2022   Lab Results  Component Value Date   HGBA1C 5.5 07/12/2015   Lab Results  Component Value Date   CHOL 211 (A) 08/22/2022   HDL 27 (A) 08/22/2022   LDLCALC 114 08/22/2022   LDLDIRECT 114.7 06/24/2013   TRIG 110 08/22/2022   CHOLHDL 3 06/24/2013    Significant Diagnostic Results in last 30 days:  No results found.  Assessment/Plan Vitamin D deficiency Vit D level 23 01/03/23, started Vit D 2000u qd.   Essential hypertension  blood pressure is controlled, on amlodipine Metoprolol, Bun/creat 13/0.5 01/03/23  Venous (peripheral) insufficiency  peripheral edema is mild left foot, on Furosemide. Cardiology f/u  Esophageal reflux  stable, on Omeprazole.   Anxiety and depression  Depression/anxiety, lifelong issue, but sleeps/eats at her baseline. TSH 1.53 08/22/22  Slow transit constipation Stable, takes MiraLax   Mild cognitive impairment functioning well [facility], TSH 1.53, Vit B12 562 08/22/22  ANEMIA, MILD Hgb 12.4 01/03/23  Osteoarthritis, multiple sites S/p left bipolar hemiarthroplasty 10/12/20 for left hip fracture, healed. left leg/knee pain sometimes, takes Tylenol.    Family/ staff Communication: plan of care reviewed with the patient and charge nurse.   Labs/tests ordered:  none  Time spend 40 minutes.

## 2023-01-07 NOTE — Assessment & Plan Note (Signed)
stable, on Omeprazole.

## 2023-01-07 NOTE — Assessment & Plan Note (Signed)
S/p left bipolar hemiarthroplasty 10/12/20 for left hip fracture, healed. left leg/knee pain sometimes, takes Tylenol.

## 2023-05-16 ENCOUNTER — Encounter: Payer: Self-pay | Admitting: Nurse Practitioner

## 2023-05-16 DIAGNOSIS — W19XXXA Unspecified fall, initial encounter: Secondary | ICD-10-CM | POA: Insufficient documentation

## 2023-06-18 ENCOUNTER — Non-Acute Institutional Stay: Payer: MEDICARE | Admitting: Nurse Practitioner

## 2023-06-18 ENCOUNTER — Encounter: Payer: Self-pay | Admitting: Nurse Practitioner

## 2023-06-18 DIAGNOSIS — G3184 Mild cognitive impairment, so stated: Secondary | ICD-10-CM

## 2023-06-18 DIAGNOSIS — F32A Depression, unspecified: Secondary | ICD-10-CM | POA: Diagnosis not present

## 2023-06-18 DIAGNOSIS — M159 Polyosteoarthritis, unspecified: Secondary | ICD-10-CM

## 2023-06-18 DIAGNOSIS — K5901 Slow transit constipation: Secondary | ICD-10-CM

## 2023-06-18 DIAGNOSIS — I10 Essential (primary) hypertension: Secondary | ICD-10-CM

## 2023-06-18 DIAGNOSIS — K219 Gastro-esophageal reflux disease without esophagitis: Secondary | ICD-10-CM | POA: Diagnosis not present

## 2023-06-18 DIAGNOSIS — D649 Anemia, unspecified: Secondary | ICD-10-CM

## 2023-06-18 DIAGNOSIS — E559 Vitamin D deficiency, unspecified: Secondary | ICD-10-CM | POA: Diagnosis not present

## 2023-06-18 DIAGNOSIS — M150 Primary generalized (osteo)arthritis: Secondary | ICD-10-CM

## 2023-06-18 DIAGNOSIS — I872 Venous insufficiency (chronic) (peripheral): Secondary | ICD-10-CM | POA: Diagnosis not present

## 2023-06-18 DIAGNOSIS — F419 Anxiety disorder, unspecified: Secondary | ICD-10-CM

## 2023-06-18 NOTE — Assessment & Plan Note (Signed)
At baseline, will benefit from talk therapy,

## 2023-06-18 NOTE — Assessment & Plan Note (Signed)
blood pressure is controlled, on amlodipine Metoprolol, Bun/creat 13/0.5 01/03/23 

## 2023-06-18 NOTE — Assessment & Plan Note (Signed)
stable, on Omeprazole.  

## 2023-06-18 NOTE — Assessment & Plan Note (Signed)
functioning well [facility], TSH 1.53, Vit B12 562 08/22/22 

## 2023-06-18 NOTE — Assessment & Plan Note (Signed)
Vit D level 23 01/03/23, started Vit D 2000u qd.  

## 2023-06-18 NOTE — Assessment & Plan Note (Signed)
peripheral edema is mild left foot, on Furosemide. Cardiology f/u 

## 2023-06-18 NOTE — Progress Notes (Signed)
Location:  [facility] Room Number: AL/905/A Place of Service:  ALF (13) Provider:  Christropher [name] X, NP Mahlon Gammon, MD  Patient Care Team: Mahlon Gammon, MD as PCP - General (Internal Medicine) Jodi Geralds, MD as Consulting Physician (Orthopedic Surgery)  Extended Emergency Contact Information Primary Emergency Contact: Riley Lam Address: [address]          [place], [place] [address] Darden Amber of [place] Phone: [phone] Relation: Relative Secondary Emergency Contact: Trinidad Curet Address: [address]          [place], [place] [address] Darden Amber of [place] Home Phone: [phone] Mobile Phone: [phone] Relation: Relative  Code Status:  DNR Goals of care: Advanced Directive information    06/18/2023    9:16 AM  Advanced Directives  Does Patient Have a Medical Advance Directive? Yes  Type of Estate agent of [place];Living will;Out of facility DNR (pink MOST or yellow form)  Does patient want to make changes to medical advance directive? No - Patient declined  Copy of Healthcare Power of Attorney in Chart? Yes - validated most recent copy scanned in chart (See row information)  Pre-existing out of facility DNR order (yellow form or pink MOST form) Pink MOST form placed in chart (order not valid for inpatient use);Yellow form placed in chart (order not valid for inpatient use)     Chief Complaint  Patient presents with   Medical Management of Chronic Issues    Patient is here for a follow up for chronic conditions Elevated BP need 2nd reading for care gap metric     HPI:  Pt is a 89 y.o. female seen today for medical management of chronic diseases.      Anemia, Hgb 12.4 01/03/23             OA, S/p left bipolar hemiarthroplasty 10/12/20 for left hip fracture, healed. left leg/knee pain sometimes, takes Tylenol.             Vit D deficiency, Vit D level 23 01/03/23, started Vit  D 2000u qd.              HTN, blood pressure is controlled, on amlodipine Metoprolol, Bun/creat 13/0.5 01/03/23             CHF/PVD, peripheral edema is mild left foot, on Furosemide. Cardiology f/u             GERD, stable, on Omeprazole.              Depression/anxiety, lifelong issue, but sleeps/eats at her baseline. TSH 1.53 08/22/22             Constipation, takes MiraLax              Cognitive impairment, functioning well [facility], TSH 1.53, Vit B12 562 08/22/22  Past Medical History:  Diagnosis Date   Aortic heart murmur    AS/AI on 2D ECHO ; SBE prophylaxis Rxed   Chest pain 2007 ; 2011   negative cardiac assessment   GERD (gastroesophageal reflux disease)    Hyperlipidemia    Hypertension [   Interstitial cystitis    Resolved   Ocular migraine 04/10/2012   Osteoporosis    T score - 3.0 @ femoral neck; Fosamax affected swallowing   Peripheral vascular disease ([facility])    Postmenopausal    No PMH or HRT   Past Surgical History:  Procedure Laterality Date   ANTERIOR APPROACH HEMI HIP ARTHROPLASTY Right 09/03/2019   Procedure: ANTERIOR APPROACH HEMI HIP ARTHROPLASTY BIPOLAR;  Surgeon: Jodi Geralds, MD;  Location: [facility];  Service: Orthopedics;  Laterality: Right;   COLONOSCOPY  2011   Dr Jarold Motto   HIP ARTHROPLASTY Left 10/11/2020   Procedure: ARTHROPLASTY BIPOLAR HIP (HEMIARTHROPLASTY) ANTERIOR APPROACH;  Surgeon: Jodi Geralds, MD;  Location: [facility];  Service: Orthopedics;  Laterality: Left;    Allergies  Allergen Reactions   Amitid [Amitriptyline Hcl]     Patient experienced a dizzy, spinning feeling    Metronidazole     REACTION: throat swells   Penicillins     REACTION: rash & fever   Alendronate Sodium     REACTION: unspecified   Amoxicillin Nausea And Vomiting   Clarithromycin     GI intolerance   Flonase [Fluticasone Propionate]     Slight Headache    Promethazine Hcl     REACTION: unspecified   Robitussin (Alcohol Free) [Guaifenesin] Other (See Comments)     unknown hyperactive   Spironolactone     09/21/13 weakness in legs   Singulair [Montelukast Sodium]     "spacey"; ineffective    Outpatient Encounter Medications as of 06/18/2023  Medication Sig   acetaminophen (TYLENOL) 325 MG tablet Take 650 mg by mouth in the morning and at bedtime. Not to exceed 3000 mg per 24 hours.   acetaminophen (TYLENOL) 500 MG tablet Take 500 mg by mouth every 8 (eight) hours as needed for moderate pain.   amLODipine (NORVASC) 10 MG tablet Take 1 tablet (10 mg total) by mouth daily.   aspirin EC 81 MG tablet Take 81 mg by mouth daily. Swallow whole.   docusate sodium (COLACE) 100 MG capsule Take 1 capsule (100 mg total) by mouth 2 (two) times daily as needed (for constipation).   fexofenadine (ALLEGRA) 180 MG tablet Take 180 mg by mouth daily as needed for allergies or rhinitis.    furosemide (LASIX) 40 MG tablet Take 1 tablet (40 mg total) by mouth daily.   Menthol, Topical Analgesic, (BIOFREEZE) 4 % GEL Apply 1 application topically 2 (two) times daily as needed (left knee pain).    metoprolol succinate (TOPROL-XL) 25 MG 24 hr tablet TAKE 1.5 TABLETS (37.5 MG TOTAL) BY MOUTH DAILY.   Multiple Vitamins-Minerals (MULTIVITAMIN ADULT) CHEW Chew 400 mg by mouth daily.   omeprazole (PRILOSEC) 20 MG capsule Take 20 mg by mouth daily.   polyethylene glycol (MIRALAX / GLYCOLAX) 17 g packet Take 17 g by mouth daily.   potassium chloride SA (KLOR-CON) 20 MEQ tablet Take 20 mEq by mouth daily.   No facility-administered encounter medications on file as of 06/18/2023.    Review of Systems  Constitutional:  Negative for appetite change, fatigue and fever.  HENT:  Positive for hearing loss. Negative for congestion and trouble swallowing.   Eyes:  Negative for visual disturbance.  Respiratory:  Negative for cough, shortness of breath and wheezing.   Cardiovascular:  Positive for leg swelling.  Gastrointestinal:  Negative for abdominal pain and constipation.       GERD   Genitourinary:  Positive for frequency. Negative for dysuria and urgency.  Musculoskeletal:  Positive for arthralgias and gait problem.       Knee pain, mostly in the left, ambulates with walker, better  Skin:  Negative for color change.  Neurological:  Negative for speech difficulty, weakness and light-headedness.       Memory lapses.   Psychiatric/Behavioral:  Negative for behavioral problems and sleep disturbance. The patient is not nervous/anxious.     Immunization History  Administered Date(s) Administered   Fluad Quad(high Dose 65+) 10/05/2019, 10/21/2022   Influenza Split 12/10/2011, 09/29/2012   Influenza, High Dose Seasonal PF 09/21/2014, 08/27/2016, 08/27/2017, 09/23/2018   Influenza,inj,Quad PF,6+ Mos 10/08/2013, 09/12/2015   Influenza-Unspecified 10/27/2020, 10/18/2021   Moderna Sars-Covid-2 Vaccination 11/07/2020   PFIZER(Purple Top)SARS-COV-2 Vaccination 02/28/2020, 03/29/2020, 11/07/2020, 05/29/2021, 09/18/2021   Pfizer Covid-19 Vaccine Bivalent Booster 52yrs & up 10/31/2022   Pneumococcal Conjugate-13 08/22/2015   Pneumococcal Polysaccharide-23 11/23/2008   Td 03/23/2004   Tdap 07/12/2015   Zoster Recombinat (Shingrix) 01/01/2022, 03/26/2022   Pertinent  Health Maintenance Due  Topic Date Due   INFLUENZA VACCINE  07/31/2023   DEXA SCAN  Completed      10/13/2020    1:00 AM 10/13/2020    8:15 AM 04/08/2021    2:44 PM 01/07/2023    9:34 AM 06/18/2023    9:16 AM  Fall Risk  Falls in the past year?    0 1  Was there an injury with Fall?    0 0  Fall Risk Category Calculator    0 1  Fall Risk Category (Retired)    Low   (RETIRED) Patient Fall Risk Level High fall risk High fall risk High fall risk Low fall risk   Patient at Risk for Falls Due to    No Fall Risks History of fall(s);Impaired balance/gait;Impaired mobility;Impaired vision  Fall risk Follow up    Falls evaluation completed Falls evaluation completed  Fall risk Follow up - Comments     High Fall Risk    Functional Status Survey:    Vitals:   06/18/23 0914  BP: (!) 142/74  Pulse: 68  Resp: 18  Temp: 97.6 F (36.4 C)  SpO2: 97%  Weight: 137 lb 3.2 oz (62.2 kg)  Height: 5\' 4"  (1.626 m)   Body mass index is 23.55 kg/m. Physical Exam Vitals and nursing note reviewed.  Constitutional:      Appearance: Normal appearance.  HENT:     Head: Normocephalic and atraumatic.     Nose: Nose normal.     Mouth/Throat:     Mouth: Mucous membranes are moist.  Eyes:     Extraocular Movements: Extraocular movements intact.     Conjunctiva/sclera: Conjunctivae normal.     Pupils: Pupils are equal, round, and reactive to light.  Cardiovascular:     Rate and Rhythm: Normal rate.     Heart sounds: No murmur heard.    Comments: HR 100 bpm Pulmonary:     Effort: Pulmonary effort is normal.     Breath sounds: No rales.  Abdominal:     General: Bowel sounds are normal.     Palpations: Abdomen is soft.     Tenderness: There is no abdominal tenderness.  Musculoskeletal:     Cervical back: Normal range of motion and neck supple.     Right lower leg: No edema.     Left lower leg: Edema present.     Comments: Trace edema LLE  Skin:    General: Skin is warm and dry.  Neurological:     General: No focal deficit present.     Mental Status: She is alert and oriented to person, place, and time. Mental status is at baseline.     Motor: No weakness.     Coordination: Coordination normal.     Gait: Gait abnormal.  Psychiatric:  Mood and Affect: Mood normal.        Behavior: Behavior normal.        Thought Content: Thought content normal.        Judgment: Judgment normal.     Comments: Admitted feeling depressed or sad at times, but declined antidepressant, stated she sleeps well, has supportive family, is happy at [facility]      Labs reviewed: Recent Labs    08/22/22 0000 01/03/23 0000  NA 137 139  K 4.3 4.5  CL 102 103  CO2 29* 30*  BUN 14 13  CREATININE 0.6 0.5   CALCIUM 9.5 9.6   Recent Labs    08/22/22 0000 01/03/23 0000  AST 10* 11*  ALT 14 12  ALKPHOS 89 91  ALBUMIN 4.0 3.8   Recent Labs    08/22/22 0000 01/03/23 0000  WBC 6.4 6.1  NEUTROABS 3,840.00 4,069.00  HGB 12.9 12.4  HCT 38 37  PLT 259 295   Lab Results  Component Value Date   TSH 1.53 08/22/2022   Lab Results  Component Value Date   HGBA1C 5.5 07/12/2015   Lab Results  Component Value Date   CHOL 211 (A) 08/22/2022   HDL 27 (A) 08/22/2022   LDLCALC 114 08/22/2022   LDLDIRECT 114.7 06/24/2013   TRIG 110 08/22/2022   CHOLHDL 3 06/24/2013    Significant Diagnostic Results in last 30 days:  No results found.  Assessment/Plan Osteoarthritis, multiple sites  S/p left bipolar hemiarthroplasty 10/12/20 for left hip fracture, healed. left leg/knee pain sometimes with walking,  takes Tylenol, will try Lidoderm patch. Therapy to eval and tx.   ANEMIA, MILD  Hgb 12.4 01/03/23  Anxiety and depression At baseline, will benefit from talk therapy,   Vitamin D deficiency  Vit D level 23 01/03/23, started Vit D 2000u qd.   Essential hypertension  blood pressure is controlled, on amlodipine Metoprolol, Bun/creat 13/0.5 01/03/23  Venous (peripheral) insufficiency  peripheral edema is mild left foot, on Furosemide. Cardiology f/u  Esophageal reflux stable, on Omeprazole.   Slow transit constipation  takes MiraLax   Mild cognitive impairment  functioning well [facility], TSH 1.53, Vit B12 562 08/22/22     Family/ staff Communication: plan of care reviewed with the patient and charge nurse.   Labs/tests ordered:  none  Time spend 40 minutes.

## 2023-06-18 NOTE — Assessment & Plan Note (Signed)
takes MiraLax 

## 2023-06-18 NOTE — Assessment & Plan Note (Addendum)
S/p left bipolar hemiarthroplasty 10/12/20 for left hip fracture, healed. left leg/knee pain sometimes with walking,  takes Tylenol, will try Lidoderm patch. Therapy to eval and tx.

## 2023-06-18 NOTE — Assessment & Plan Note (Signed)
Hgb 12.4 01/03/23 

## 2023-06-25 DIAGNOSIS — M25562 Pain in left knee: Secondary | ICD-10-CM | POA: Diagnosis not present

## 2023-06-25 DIAGNOSIS — R2681 Unsteadiness on feet: Secondary | ICD-10-CM | POA: Diagnosis not present

## 2023-06-25 DIAGNOSIS — M6281 Muscle weakness (generalized): Secondary | ICD-10-CM | POA: Diagnosis not present

## 2023-06-27 DIAGNOSIS — R2681 Unsteadiness on feet: Secondary | ICD-10-CM | POA: Diagnosis not present

## 2023-06-27 DIAGNOSIS — M25562 Pain in left knee: Secondary | ICD-10-CM | POA: Diagnosis not present

## 2023-06-27 DIAGNOSIS — M6281 Muscle weakness (generalized): Secondary | ICD-10-CM | POA: Diagnosis not present

## 2023-07-01 DIAGNOSIS — M6281 Muscle weakness (generalized): Secondary | ICD-10-CM | POA: Diagnosis not present

## 2023-07-01 DIAGNOSIS — M25562 Pain in left knee: Secondary | ICD-10-CM | POA: Diagnosis not present

## 2023-07-01 DIAGNOSIS — R2681 Unsteadiness on feet: Secondary | ICD-10-CM | POA: Diagnosis not present

## 2023-07-02 ENCOUNTER — Encounter: Payer: Self-pay | Admitting: Adult Health

## 2023-07-02 ENCOUNTER — Non-Acute Institutional Stay: Payer: MEDICARE | Admitting: Adult Health

## 2023-07-02 DIAGNOSIS — M25562 Pain in left knee: Secondary | ICD-10-CM | POA: Diagnosis not present

## 2023-07-02 DIAGNOSIS — G8929 Other chronic pain: Secondary | ICD-10-CM | POA: Diagnosis not present

## 2023-07-02 DIAGNOSIS — K219 Gastro-esophageal reflux disease without esophagitis: Secondary | ICD-10-CM

## 2023-07-02 DIAGNOSIS — I10 Essential (primary) hypertension: Secondary | ICD-10-CM | POA: Diagnosis not present

## 2023-07-02 DIAGNOSIS — M1712 Unilateral primary osteoarthritis, left knee: Secondary | ICD-10-CM | POA: Diagnosis not present

## 2023-07-02 DIAGNOSIS — M6281 Muscle weakness (generalized): Secondary | ICD-10-CM | POA: Diagnosis not present

## 2023-07-02 DIAGNOSIS — M85862 Other specified disorders of bone density and structure, left lower leg: Secondary | ICD-10-CM | POA: Diagnosis not present

## 2023-07-02 DIAGNOSIS — R2681 Unsteadiness on feet: Secondary | ICD-10-CM | POA: Diagnosis not present

## 2023-07-02 NOTE — Progress Notes (Signed)
Location:  [facility] Room Number: 905 AL Place of Service:  ALF [phone]) Provider:  Kenard Gower, DNP, FNP-BC  Patient Care Team: Mahlon Gammon, MD as PCP - General (Internal Medicine) Jodi Geralds, MD as Consulting Physician (Orthopedic Surgery)  Extended Emergency Contact Information Primary Emergency Contact: Riley Lam Address: [address]          [address]          [place], [place] [address] Darden Amber of [place] Phone: [phone] Relation: Relative Secondary Emergency Contact: Trinidad Curet Address: [address]          [place], [place] [address] Darden Amber of [place] Home Phone: [phone] Mobile Phone: [phone] Relation: Relative  Code Status:  DNR  Goals of care: Advanced Directive information    07/02/2023   11:43 AM  Advanced Directives  Does Patient Have a Medical Advance Directive? Yes  Type of Estate agent of [place];Living will;Out of facility DNR (pink MOST or yellow form)  Does patient want to make changes to medical advance directive? No - Patient declined  Copy of Healthcare Power of Attorney in Chart? Yes - validated most recent copy scanned in chart (See row information)  Pre-existing out of facility DNR order (yellow form or pink MOST form) Pink MOST form placed in chart (order not valid for inpatient use);Yellow form placed in chart (order not valid for inpatient use)     Chief Complaint  Patient presents with   Acute Visit    Left knee pain    HPI:  Alicia Boyd is a 89 y.o. female seen today for an acute visit regarding left knee pain. She is a resident of [facility]. She was seen in the room today with Alicia Boyd in the room. She complains of left knee pain. She stated that in the past, she had her left knee drained and had steroid injections. Noted Left knee 1+edema. She denies recent trauma. Therapist has been using ultrasound heat therapy. She takes Acetaminophen and  Lidoderm patch for pain.  BP 112/56, takes Lasix, Metoprolol succinate and Lasix for hypertension  She takes Omeprazole for GERD.   Past Medical History:  Diagnosis Date   Aortic heart murmur    AS/AI on 2D ECHO ; SBE prophylaxis Rxed   Chest pain 2007 ; 2011   negative cardiac assessment   GERD (gastroesophageal reflux disease)    Hyperlipidemia    Hypertension [   Interstitial cystitis    Resolved   Ocular migraine 04/10/2012   Osteoporosis    T score - 3.0 @ femoral neck; Fosamax affected swallowing   Peripheral vascular disease (HCC)    Postmenopausal    No PMH or HRT   Past Surgical History:  Procedure Laterality Date   ANTERIOR APPROACH HEMI HIP ARTHROPLASTY Right 09/03/2019   Procedure: ANTERIOR APPROACH HEMI HIP ARTHROPLASTY BIPOLAR;  Surgeon: Jodi Geralds, MD;  Location: [facility];  Service: Orthopedics;  Laterality: Right;   COLONOSCOPY  2011   Dr Jarold Motto   HIP ARTHROPLASTY Left 10/11/2020   Procedure: ARTHROPLASTY BIPOLAR HIP (HEMIARTHROPLASTY) ANTERIOR APPROACH;  Surgeon: Jodi Geralds, MD;  Location: [facility];  Service: Orthopedics;  Laterality: Left;    Allergies  Allergen Reactions   Amitid [Amitriptyline Hcl]     Patient experienced a dizzy, spinning feeling    Metronidazole     REACTION: throat swells   Penicillins     REACTION: rash & fever   Alendronate Sodium  REACTION: unspecified   Amoxicillin Nausea And Vomiting   Clarithromycin     GI intolerance   Flonase [Fluticasone Propionate]     Slight Headache    Promethazine Hcl     REACTION: unspecified   Robitussin (Alcohol Free) [Guaifenesin] Other (See Comments)    unknown hyperactive   Spironolactone     09/21/13 weakness in legs   Singulair [Montelukast Sodium]     "spacey"; ineffective    Outpatient Encounter Medications as of 07/02/2023  Medication Sig   acetaminophen (TYLENOL) 325 MG tablet Take 650 mg by mouth in the morning and at bedtime. Not to exceed 3000 mg per 24 hours.    acetaminophen (TYLENOL) 500 MG tablet Take 500 mg by mouth every 8 (eight) hours as needed for moderate pain.   amLODipine (NORVASC) 10 MG tablet Take 1 tablet (10 mg total) by mouth daily.   aspirin EC 81 MG tablet Take 81 mg by mouth daily. Swallow whole.   docusate sodium (COLACE) 100 MG capsule Take 1 capsule (100 mg total) by mouth 2 (two) times daily as needed (for constipation).   fexofenadine (ALLEGRA) 180 MG tablet Take 180 mg by mouth daily as needed for allergies or rhinitis.    furosemide (LASIX) 40 MG tablet Take 1 tablet (40 mg total) by mouth daily.   lidocaine (LIDODERM) 5 % Place 1 patch onto the skin daily. Remove & Discard patch within 12 hours or as directed by MD   Menthol, Topical Analgesic, (BIOFREEZE) 4 % GEL Apply 1 application topically 2 (two) times daily as needed (left knee pain).    metoprolol succinate (TOPROL-XL) 25 MG 24 hr tablet TAKE 1.5 TABLETS (37.5 MG TOTAL) BY MOUTH DAILY.   Multiple Vitamins-Minerals (MULTIVITAMIN ADULT) CHEW Chew 400 mg by mouth daily.   omeprazole (PRILOSEC) 20 MG capsule Take 20 mg by mouth daily.   polyethylene glycol (MIRALAX / GLYCOLAX) 17 g packet Take 17 g by mouth daily.   potassium chloride SA (KLOR-CON) 20 MEQ tablet Take 20 mEq by mouth daily.   No facility-administered encounter medications on file as of 07/02/2023.    Review of Systems  Constitutional:  Negative for appetite change, chills, fatigue and fever.  HENT:  Negative for congestion, hearing loss, rhinorrhea and sore throat.   Eyes: Negative.   Respiratory:  Negative for cough, shortness of breath and wheezing.   Cardiovascular:  Negative for chest pain, palpitations and leg swelling.  Gastrointestinal:  Negative for abdominal pain, constipation, diarrhea, nausea and vomiting.  Genitourinary:  Negative for dysuria.  Musculoskeletal:  Positive for joint swelling. Negative for arthralgias, back pain and myalgias.       Knee pain  Skin:  Negative for color change,  rash and wound.  Neurological:  Negative for dizziness, weakness and headaches.  Psychiatric/Behavioral:  Negative for behavioral problems. The patient is not nervous/anxious.      Immunization History  Administered Date(s) Administered   Fluad Quad(high Dose 65+) 10/05/2019, 10/21/2022   Influenza Split 12/10/2011, 09/29/2012   Influenza, High Dose Seasonal PF 09/21/2014, 08/27/2016, 08/27/2017, 09/23/2018   Influenza,inj,Quad PF,6+ Mos 10/08/2013, 09/12/2015   Influenza-Unspecified 10/27/2020, 10/18/2021   Moderna Sars-Covid-2 Vaccination 11/07/2020   PFIZER(Purple Top)SARS-COV-2 Vaccination 02/28/2020, 03/29/2020, 11/07/2020, 05/29/2021, 09/18/2021   Pfizer Covid-19 Vaccine Bivalent Booster 38yrs & up 10/31/2022   Pneumococcal Conjugate-13 08/22/2015   Pneumococcal Polysaccharide-23 11/23/2008   Td 03/23/2004   Tdap 07/12/2015   Zoster Recombinant(Shingrix) 01/01/2022, 03/26/2022   Pertinent  Health Maintenance Due  Topic Date Due  INFLUENZA VACCINE  07/31/2023   DEXA SCAN  Completed      10/13/2020    1:00 AM 10/13/2020    8:15 AM 04/08/2021    2:44 PM 01/07/2023    9:34 AM 06/18/2023    9:16 AM  Fall Risk  Falls in the past year?    0 1  Was there an injury with Fall?    0 0  Fall Risk Category Calculator    0 1  Fall Risk Category (Retired)    Low   (RETIRED) Patient Fall Risk Level High fall risk High fall risk High fall risk Low fall risk   Patient at Risk for Falls Due to    No Fall Risks History of fall(s);Impaired balance/gait;Impaired mobility;Impaired vision  Fall risk Follow up    Falls evaluation completed Falls evaluation completed  Fall risk Follow up - Comments     High Fall Risk     Vitals:   07/02/23 1134  BP: (!) 112/56  Pulse: 72  Resp: 16  Temp: (!) 97 F (36.1 C)  SpO2: 93%  Weight: 134 lb 12.8 oz (61.1 kg)  Height: 5\' 4"  (1.626 m)   Body mass index is 23.14 kg/m.  Physical Exam Constitutional:      Appearance: Normal appearance.   HENT:     Head: Normocephalic and atraumatic.     Nose: Nose normal.     Mouth/Throat:     Mouth: Mucous membranes are moist.  Eyes:     Conjunctiva/sclera: Conjunctivae normal.  Cardiovascular:     Rate and Rhythm: Normal rate and regular rhythm.  Pulmonary:     Effort: Pulmonary effort is normal.     Breath sounds: Normal breath sounds.  Abdominal:     General: Bowel sounds are normal.     Palpations: Abdomen is soft.  Musculoskeletal:        General: Swelling present. Normal range of motion.     Cervical back: Normal range of motion.     Left lower leg: Edema present.     Comments: LLE 1+edema  Skin:    General: Skin is warm and dry.  Neurological:     General: No focal deficit present.     Mental Status: She is alert and oriented to person, place, and time.  Psychiatric:        Mood and Affect: Mood normal.        Behavior: Behavior normal.        Thought Content: Thought content normal.        Judgment: Judgment normal.        Labs reviewed: Recent Labs    08/22/22 0000 01/03/23 0000  NA 137 139  K 4.3 4.5  CL 102 103  CO2 29* 30*  BUN 14 13  CREATININE 0.6 0.5  CALCIUM 9.5 9.6   Recent Labs    08/22/22 0000 01/03/23 0000  AST 10* 11*  ALT 14 12  ALKPHOS 89 91  ALBUMIN 4.0 3.8   Recent Labs    08/22/22 0000 01/03/23 0000  WBC 6.4 6.1  NEUTROABS 3,840.00 4,069.00  HGB 12.9 12.4  HCT 38 37  PLT 259 295   Lab Results  Component Value Date   TSH 1.53 08/22/2022   Lab Results  Component Value Date   HGBA1C 5.5 07/12/2015   Lab Results  Component Value Date   CHOL 211 (A) 08/22/2022   HDL 27 (A) 08/22/2022   LDLCALC 114 08/22/2022   LDLDIRECT 114.7  06/24/2013   TRIG 110 08/22/2022   CHOLHDL 3 06/24/2013    Significant Diagnostic Results in last 30 days:  No results found.  Assessment/Plan  1. Chronic pain of left knee -  continue Acetaminophen and Lidoderm for pain -  x-ray of left knee -  orthopedic consult  2. Essential  hypertension -  BPs stable -  continue Lasix, Amlodipine and Metoprolol succinate  3. Gastroesophageal reflux disease without esophagitis -  stable -  continue Omeprazole     Family/ staff Communication: Discussed plan of care with resident and charge nurse.  Labs/tests ordered: x-ray of left knee    Kenard Gower, DNP, MSN, FNP-BC [facility] [phone] (Monday-Friday 8:00 a.m. - 5:00 p.m.) [phone] (after hours)

## 2023-07-04 DIAGNOSIS — R2681 Unsteadiness on feet: Secondary | ICD-10-CM | POA: Diagnosis not present

## 2023-07-04 DIAGNOSIS — M25562 Pain in left knee: Secondary | ICD-10-CM | POA: Diagnosis not present

## 2023-07-04 DIAGNOSIS — M6281 Muscle weakness (generalized): Secondary | ICD-10-CM | POA: Diagnosis not present

## 2023-07-07 DIAGNOSIS — M6281 Muscle weakness (generalized): Secondary | ICD-10-CM | POA: Diagnosis not present

## 2023-07-07 DIAGNOSIS — M25562 Pain in left knee: Secondary | ICD-10-CM | POA: Diagnosis not present

## 2023-07-07 DIAGNOSIS — R2681 Unsteadiness on feet: Secondary | ICD-10-CM | POA: Diagnosis not present

## 2023-07-10 DIAGNOSIS — M1712 Unilateral primary osteoarthritis, left knee: Secondary | ICD-10-CM | POA: Diagnosis not present

## 2023-07-10 DIAGNOSIS — R2681 Unsteadiness on feet: Secondary | ICD-10-CM | POA: Diagnosis not present

## 2023-07-10 DIAGNOSIS — M25562 Pain in left knee: Secondary | ICD-10-CM | POA: Diagnosis not present

## 2023-07-10 DIAGNOSIS — M6281 Muscle weakness (generalized): Secondary | ICD-10-CM | POA: Diagnosis not present

## 2023-07-11 DIAGNOSIS — M6281 Muscle weakness (generalized): Secondary | ICD-10-CM | POA: Diagnosis not present

## 2023-07-11 DIAGNOSIS — R2681 Unsteadiness on feet: Secondary | ICD-10-CM | POA: Diagnosis not present

## 2023-07-11 DIAGNOSIS — M25562 Pain in left knee: Secondary | ICD-10-CM | POA: Diagnosis not present

## 2023-07-14 DIAGNOSIS — R2681 Unsteadiness on feet: Secondary | ICD-10-CM | POA: Diagnosis not present

## 2023-07-14 DIAGNOSIS — M6281 Muscle weakness (generalized): Secondary | ICD-10-CM | POA: Diagnosis not present

## 2023-07-14 DIAGNOSIS — M25562 Pain in left knee: Secondary | ICD-10-CM | POA: Diagnosis not present

## 2023-07-17 DIAGNOSIS — M25562 Pain in left knee: Secondary | ICD-10-CM | POA: Diagnosis not present

## 2023-07-17 DIAGNOSIS — M6281 Muscle weakness (generalized): Secondary | ICD-10-CM | POA: Diagnosis not present

## 2023-07-17 DIAGNOSIS — R2681 Unsteadiness on feet: Secondary | ICD-10-CM | POA: Diagnosis not present

## 2023-07-18 DIAGNOSIS — R2681 Unsteadiness on feet: Secondary | ICD-10-CM | POA: Diagnosis not present

## 2023-07-18 DIAGNOSIS — M6281 Muscle weakness (generalized): Secondary | ICD-10-CM | POA: Diagnosis not present

## 2023-07-18 DIAGNOSIS — M25562 Pain in left knee: Secondary | ICD-10-CM | POA: Diagnosis not present

## 2023-07-22 DIAGNOSIS — M6281 Muscle weakness (generalized): Secondary | ICD-10-CM | POA: Diagnosis not present

## 2023-07-22 DIAGNOSIS — R2681 Unsteadiness on feet: Secondary | ICD-10-CM | POA: Diagnosis not present

## 2023-07-22 DIAGNOSIS — M25562 Pain in left knee: Secondary | ICD-10-CM | POA: Diagnosis not present

## 2023-07-23 DIAGNOSIS — M6281 Muscle weakness (generalized): Secondary | ICD-10-CM | POA: Diagnosis not present

## 2023-07-23 DIAGNOSIS — R2681 Unsteadiness on feet: Secondary | ICD-10-CM | POA: Diagnosis not present

## 2023-07-23 DIAGNOSIS — M25562 Pain in left knee: Secondary | ICD-10-CM | POA: Diagnosis not present

## 2023-07-24 DIAGNOSIS — M25562 Pain in left knee: Secondary | ICD-10-CM | POA: Diagnosis not present

## 2023-07-24 DIAGNOSIS — R2681 Unsteadiness on feet: Secondary | ICD-10-CM | POA: Diagnosis not present

## 2023-07-24 DIAGNOSIS — M6281 Muscle weakness (generalized): Secondary | ICD-10-CM | POA: Diagnosis not present

## 2023-07-29 DIAGNOSIS — R2681 Unsteadiness on feet: Secondary | ICD-10-CM | POA: Diagnosis not present

## 2023-07-29 DIAGNOSIS — M6281 Muscle weakness (generalized): Secondary | ICD-10-CM | POA: Diagnosis not present

## 2023-07-29 DIAGNOSIS — M25562 Pain in left knee: Secondary | ICD-10-CM | POA: Diagnosis not present

## 2023-07-31 DIAGNOSIS — M6281 Muscle weakness (generalized): Secondary | ICD-10-CM | POA: Diagnosis not present

## 2023-07-31 DIAGNOSIS — R2681 Unsteadiness on feet: Secondary | ICD-10-CM | POA: Diagnosis not present

## 2023-07-31 DIAGNOSIS — M25562 Pain in left knee: Secondary | ICD-10-CM | POA: Diagnosis not present

## 2023-08-04 DIAGNOSIS — R2681 Unsteadiness on feet: Secondary | ICD-10-CM | POA: Diagnosis not present

## 2023-08-04 DIAGNOSIS — M6281 Muscle weakness (generalized): Secondary | ICD-10-CM | POA: Diagnosis not present

## 2023-08-04 DIAGNOSIS — M25562 Pain in left knee: Secondary | ICD-10-CM | POA: Diagnosis not present

## 2023-08-05 DIAGNOSIS — R2681 Unsteadiness on feet: Secondary | ICD-10-CM | POA: Diagnosis not present

## 2023-08-05 DIAGNOSIS — M6281 Muscle weakness (generalized): Secondary | ICD-10-CM | POA: Diagnosis not present

## 2023-08-05 DIAGNOSIS — M25562 Pain in left knee: Secondary | ICD-10-CM | POA: Diagnosis not present

## 2023-08-07 DIAGNOSIS — R2681 Unsteadiness on feet: Secondary | ICD-10-CM | POA: Diagnosis not present

## 2023-08-07 DIAGNOSIS — M6281 Muscle weakness (generalized): Secondary | ICD-10-CM | POA: Diagnosis not present

## 2023-08-07 DIAGNOSIS — M25562 Pain in left knee: Secondary | ICD-10-CM | POA: Diagnosis not present

## 2023-08-08 ENCOUNTER — Encounter: Payer: Self-pay | Admitting: Nurse Practitioner

## 2023-08-08 ENCOUNTER — Non-Acute Institutional Stay: Payer: Self-pay | Admitting: Nurse Practitioner

## 2023-08-08 DIAGNOSIS — M159 Polyosteoarthritis, unspecified: Secondary | ICD-10-CM | POA: Diagnosis not present

## 2023-08-08 DIAGNOSIS — D649 Anemia, unspecified: Secondary | ICD-10-CM | POA: Diagnosis not present

## 2023-08-08 DIAGNOSIS — M25562 Pain in left knee: Secondary | ICD-10-CM | POA: Diagnosis not present

## 2023-08-08 DIAGNOSIS — U071 COVID-19: Secondary | ICD-10-CM | POA: Diagnosis not present

## 2023-08-08 DIAGNOSIS — R2681 Unsteadiness on feet: Secondary | ICD-10-CM | POA: Diagnosis not present

## 2023-08-08 DIAGNOSIS — I872 Venous insufficiency (chronic) (peripheral): Secondary | ICD-10-CM | POA: Diagnosis not present

## 2023-08-08 DIAGNOSIS — M6281 Muscle weakness (generalized): Secondary | ICD-10-CM | POA: Diagnosis not present

## 2023-08-08 DIAGNOSIS — I10 Essential (primary) hypertension: Secondary | ICD-10-CM

## 2023-08-08 NOTE — Assessment & Plan Note (Signed)
tested positive COVID, presentations: fatigue, scratched throat, hacking cough, denied chest pain, SOB, or phlegm production, she is afebrile and no O2 desaturation. The patient desires Paxlovid, started 08/07/23, tolerated 08/08/23

## 2023-08-08 NOTE — Progress Notes (Unsigned)
Location:  [facility] Room Number: 905 AL Place of Service:  ALF [phone]) Provider:  Ailsa Mireles X, NP    Patient Care Team: Mahlon Gammon, MD as PCP - General (Internal Medicine) Jodi Geralds, MD as Consulting Physician (Orthopedic Surgery)  Extended Emergency Contact Information Primary Emergency Contact: Riley Lam Address: [address]          [address]          [place], [place] [address] Darden Amber of [place] Phone: [phone] Relation: Relative Secondary Emergency Contact: Trinidad Curet Address: [address]          [place], [place] [address] Darden Amber of [place] Home Phone: [phone] Mobile Phone: [phone] Relation: Relative  Code Status:  DNR Goals of care: Advanced Directive information    08/08/2023   10:49 AM  Advanced Directives  Does Patient Have a Medical Advance Directive? Yes  Type of Estate agent of [place];Living will;Out of facility DNR (pink MOST or yellow form)  Does patient want to make changes to medical advance directive? No - Patient declined  Copy of Healthcare Power of Attorney in Chart? Yes - validated most recent copy scanned in chart (See row information)  Pre-existing out of facility DNR order (yellow form or pink MOST form) Pink MOST form placed in chart (order not valid for inpatient use);Yellow form placed in chart (order not valid for inpatient use)     Chief Complaint  Patient presents with  . Acute Visit     tested positive COVID, running nose.    HPI:  Pt is a 89 y.o. female seen today for an acute visit for tested positive COVID, presentations: fatigue, running nose, scratched throat, hacking cough, denied chest pain, SOB, or phlegm production, she is afebrile and no O2 desaturation.    Anemia, Hgb 12.4 01/03/23             OA, S/p left bipolar hemiarthroplasty 10/12/20 for left hip fracture, healed. left leg/knee pain sometimes, 07/02/23 X-ray left knee severe OA takes  Tylenol.              Vit D deficiency, Vit D level 23 01/03/23, started Vit D 2000u qd.              HTN, blood pressure is controlled, on amlodipine Metoprolol, Bun/creat 13/0.5 01/03/23             CHF/PVD, peripheral edema is mild left foot, on Furosemide. Cardiology f/u             GERD, stable, on Omeprazole.              Depression/anxiety, lifelong issue, but sleeps/eats at her baseline. TSH 1.53 08/22/22             Constipation, takes MiraLax              Cognitive impairment, functioning well [facility], TSH 1.53, Vit B12 562 08/22/22    Past Medical History:  Diagnosis Date  . Aortic heart murmur    AS/AI on 2D ECHO ; SBE prophylaxis Rxed  . Chest pain 2007 ; 2011   negative cardiac assessment  . GERD (gastroesophageal reflux disease)   . Hyperlipidemia   . Hypertension [  . Interstitial cystitis    Resolved  . Ocular migraine 04/10/2012  . Osteoporosis    T score - 3.0 @ femoral neck; Fosamax affected swallowing  . Peripheral vascular disease (HCC)   . Postmenopausal  No PMH or HRT   Past Surgical History:  Procedure Laterality Date  . ANTERIOR APPROACH HEMI HIP ARTHROPLASTY Right 09/03/2019   Procedure: ANTERIOR APPROACH HEMI HIP ARTHROPLASTY BIPOLAR;  Surgeon: Jodi Geralds, MD;  Location: [facility];  Service: Orthopedics;  Laterality: Right;  . COLONOSCOPY  2011   Dr Jarold Motto  . HIP ARTHROPLASTY Left 10/11/2020   Procedure: ARTHROPLASTY BIPOLAR HIP (HEMIARTHROPLASTY) ANTERIOR APPROACH;  Surgeon: Jodi Geralds, MD;  Location: [facility];  Service: Orthopedics;  Laterality: Left;    Allergies  Allergen Reactions  . Amitid [Amitriptyline Hcl]     Patient experienced a dizzy, spinning feeling   . Metronidazole     REACTION: throat swells  . Penicillins     REACTION: rash & fever  . Alendronate Sodium     REACTION: unspecified  . Amoxicillin Nausea And Vomiting  . Clarithromycin     GI intolerance  . Flonase [Fluticasone Propionate]     Slight Headache   . Promethazine  Hcl     REACTION: unspecified  . Robitussin (Alcohol Free) [Guaifenesin] Other (See Comments)    unknown hyperactive  . Spironolactone     09/21/13 weakness in legs  . Singulair [Montelukast Sodium]     "spacey"; ineffective    Outpatient Encounter Medications as of 08/08/2023  Medication Sig  . acetaminophen (TYLENOL) 325 MG tablet Take 650 mg by mouth in the morning and at bedtime. Not to exceed 3000 mg per 24 hours.  Marland Kitchen acetaminophen (TYLENOL) 500 MG tablet Take 500 mg by mouth every 8 (eight) hours as needed for moderate pain.  Marland Kitchen amLODipine (NORVASC) 10 MG tablet Take 1 tablet (10 mg total) by mouth daily.  Marland Kitchen aspirin EC 81 MG tablet Take 81 mg by mouth daily. Swallow whole.  . docusate sodium (COLACE) 100 MG capsule Take 1 capsule (100 mg total) by mouth 2 (two) times daily as needed (for constipation).  . fexofenadine (ALLEGRA) 180 MG tablet Take 180 mg by mouth daily as needed for allergies or rhinitis.   . furosemide (LASIX) 40 MG tablet Take 1 tablet (40 mg total) by mouth daily.  Marland Kitchen lidocaine (LIDODERM) 5 % Place 1 patch onto the skin daily. Remove & Discard patch within 12 hours or as directed by MD  . Menthol, Topical Analgesic, (BIOFREEZE) 4 % GEL Apply 1 application topically 2 (two) times daily as needed (left knee pain).   . metoprolol succinate (TOPROL-XL) 25 MG 24 hr tablet TAKE 1.5 TABLETS (37.5 MG TOTAL) BY MOUTH DAILY.  . Multiple Vitamins-Minerals (MULTIVITAMIN ADULT) CHEW Chew 400 mg by mouth daily.  . nirmatrelvir & ritonavir (PAXLOVID, 300/100,) 20 x 150 MG & 10 x 100MG  TBPK Take 2 tablets by mouth in the morning and at bedtime.  Marland Kitchen omeprazole (PRILOSEC) 20 MG capsule Take 20 mg by mouth daily.  . polyethylene glycol (MIRALAX / GLYCOLAX) 17 g packet Take 17 g by mouth daily.  . potassium chloride SA (KLOR-CON) 20 MEQ tablet Take 20 mEq by mouth daily.   No facility-administered encounter medications on file as of 08/08/2023.    Review of Systems  Constitutional:   Positive for fatigue. Negative for appetite change and fever.  HENT:  Positive for congestion, hearing loss, rhinorrhea and sore throat. Negative for trouble swallowing.        Scratchy throat  Eyes:  Negative for visual disturbance.  Respiratory:  Positive for cough. Negative for shortness of breath and wheezing.        Hacking cough  Cardiovascular:  Positive for leg swelling.  Gastrointestinal:  Negative for abdominal pain and constipation.       GERD  Genitourinary:  Positive for frequency. Negative for dysuria and urgency.  Musculoskeletal:  Positive for arthralgias and gait problem.       Knee pain, mostly in the left, ambulates with walker, better  Skin:  Negative for color change.  Neurological:  Negative for speech difficulty, weakness and light-headedness.       Memory lapses.   Psychiatric/Behavioral:  Negative for behavioral problems and sleep disturbance. The patient is not nervous/anxious.     Immunization History  Administered Date(s) Administered  . Fluad Quad(high Dose 65+) 10/05/2019, 10/21/2022  . Influenza Split 12/10/2011, 09/29/2012  . Influenza, High Dose Seasonal PF 09/21/2014, 08/27/2016, 08/27/2017, 09/23/2018  . Influenza,inj,Quad PF,6+ Mos 10/08/2013, 09/12/2015  . Influenza-Unspecified 10/27/2020, 10/18/2021  . Moderna Sars-Covid-2 Vaccination 11/07/2020  . PFIZER(Purple Top)SARS-COV-2 Vaccination 02/28/2020, 03/29/2020, 11/07/2020, 05/29/2021, 09/18/2021  . [employer] officer, [employer] 63yrs & up 10/31/2022  . Pneumococcal Conjugate-13 08/22/2015  . Pneumococcal Polysaccharide-23 11/23/2008  . Td 03/23/2004  . Tdap 07/12/2015  . Zoster Recombinant(Shingrix) 01/01/2022, 03/26/2022   Pertinent  Health Maintenance Due  Topic Date Due  . INFLUENZA VACCINE  07/31/2023  . DEXA SCAN  Completed      10/13/2020    1:00 AM 10/13/2020    8:15 AM 04/08/2021    2:44 PM 01/07/2023    9:34 AM 06/18/2023    9:16 AM  Fall Risk  Falls in the past  year?    0 1  Was there an injury with Fall?    0 0  Fall Risk Category Calculator    0 1  Fall Risk Category (Retired)    Low   (RETIRED) Patient Fall Risk Level High fall risk High fall risk High fall risk Low fall risk   Patient at Risk for Falls Due to    No Fall Risks History of fall(s);Impaired balance/gait;Impaired mobility;Impaired vision  Fall risk Follow up    Falls evaluation completed Falls evaluation completed  Fall risk Follow up - Comments     High Fall Risk   Functional Status Survey:    Vitals:   08/08/23 1034  BP: 132/74  Pulse: 76  Resp: 18  Temp: 97.6 F (36.4 C)  SpO2: 95%  Weight: 133 lb 6.4 oz (60.5 kg)  Height: 5\' 4"  (1.626 m)   Body mass index is 22.9 kg/m. Physical Exam Vitals and nursing note reviewed.  Constitutional:      Appearance: Normal appearance.  HENT:     Head: Normocephalic and atraumatic.     Nose: Congestion and rhinorrhea present.     Mouth/Throat:     Mouth: Mucous membranes are moist.     Pharynx: Posterior oropharyngeal erythema present.  Eyes:     Extraocular Movements: Extraocular movements intact.     Conjunctiva/sclera: Conjunctivae normal.     Pupils: Pupils are equal, round, and reactive to light.  Cardiovascular:     Rate and Rhythm: Normal rate.     Heart sounds: No murmur heard. Pulmonary:     Effort: Pulmonary effort is normal.     Breath sounds: No rales.  Abdominal:     General: Bowel sounds are normal.     Palpations: Abdomen is soft.     Tenderness: There is no abdominal tenderness.  Musculoskeletal:     Cervical back: Normal range of motion and neck supple.     Right lower leg: No  edema.     Left lower leg: Edema present.     Comments: Trace edema LLE  Skin:    General: Skin is warm and dry.  Neurological:     General: No focal deficit present.     Mental Status: She is alert and oriented to person, place, and time. Mental status is at baseline.     Motor: No weakness.     Coordination: Coordination  normal.     Gait: Gait abnormal.  Psychiatric:        Mood and Affect: Mood normal.        Behavior: Behavior normal.        Thought Content: Thought content normal.        Judgment: Judgment normal.     Comments: Admitted feeling depressed or sad at times, but declined antidepressant, stated she sleeps well, has supportive family, is happy at [facility].     Labs reviewed: Recent Labs    08/22/22 0000 01/03/23 0000  NA 137 139  K 4.3 4.5  CL 102 103  CO2 29* 30*  BUN 14 13  CREATININE 0.6 0.5  CALCIUM 9.5 9.6   Recent Labs    08/22/22 0000 01/03/23 0000  AST 10* 11*  ALT 14 12  ALKPHOS 89 91  ALBUMIN 4.0 3.8   Recent Labs    08/22/22 0000 01/03/23 0000  WBC 6.4 6.1  NEUTROABS 3,840.00 4,069.00  HGB 12.9 12.4  HCT 38 37  PLT 259 295   Lab Results  Component Value Date   TSH 1.53 08/22/2022   Lab Results  Component Value Date   HGBA1C 5.5 07/12/2015   Lab Results  Component Value Date   CHOL 211 (A) 08/22/2022   HDL 27 (A) 08/22/2022   LDLCALC 114 08/22/2022   LDLDIRECT 114.7 06/24/2013   TRIG 110 08/22/2022   CHOLHDL 3 06/24/2013    Significant Diagnostic Results in last 30 days:  No results found.  Assessment/Plan COVID-19 virus infection tested positive COVID, presentations: fatigue, scratched throat, hacking cough, denied chest pain, SOB, or phlegm production, she is afebrile and no O2 desaturation. The patient desires Paxlovid, started 08/07/23, tolerated 08/08/23  ANEMIA, MILD Hgb 12.4 01/03/23  Osteoarthritis, multiple sites S/p left bipolar hemiarthroplasty 10/12/20 for left hip fracture, healed. left leg/knee pain sometimes, takes Tylenol. 07/02/23 X-ray left knee severe OA  Essential hypertension blood pressure is controlled, on amlodipine Metoprolol, Bun/creat 13/0.5 01/03/23  Venous (peripheral) insufficiency peripheral edema is mild left foot, on Furosemide. Cardiology f/u     Family/ staff Communication: plan of care reviewed  with the patient and charge nurse   Labs/tests ordered:  none  Time spend 40 minutes.

## 2023-08-08 NOTE — Assessment & Plan Note (Signed)
peripheral edema is mild left foot, on Furosemide. Cardiology f/u

## 2023-08-08 NOTE — Assessment & Plan Note (Signed)
S/p left bipolar hemiarthroplasty 10/12/20 for left hip fracture, healed. left leg/knee pain sometimes, takes Tylenol. 07/02/23 X-ray left knee severe OA

## 2023-08-08 NOTE — Assessment & Plan Note (Signed)
blood pressure is controlled, on amlodipine Metoprolol, Bun/creat 13/0.5 01/03/23 

## 2023-08-08 NOTE — Assessment & Plan Note (Signed)
Hgb 12.4 01/03/23

## 2023-08-11 DIAGNOSIS — R2681 Unsteadiness on feet: Secondary | ICD-10-CM | POA: Diagnosis not present

## 2023-08-11 DIAGNOSIS — M6281 Muscle weakness (generalized): Secondary | ICD-10-CM | POA: Diagnosis not present

## 2023-08-11 DIAGNOSIS — M25562 Pain in left knee: Secondary | ICD-10-CM | POA: Diagnosis not present

## 2023-08-13 DIAGNOSIS — R2681 Unsteadiness on feet: Secondary | ICD-10-CM | POA: Diagnosis not present

## 2023-08-13 DIAGNOSIS — M6281 Muscle weakness (generalized): Secondary | ICD-10-CM | POA: Diagnosis not present

## 2023-08-13 DIAGNOSIS — M25562 Pain in left knee: Secondary | ICD-10-CM | POA: Diagnosis not present

## 2023-08-18 DIAGNOSIS — M6281 Muscle weakness (generalized): Secondary | ICD-10-CM | POA: Diagnosis not present

## 2023-08-18 DIAGNOSIS — R2681 Unsteadiness on feet: Secondary | ICD-10-CM | POA: Diagnosis not present

## 2023-08-18 DIAGNOSIS — M25562 Pain in left knee: Secondary | ICD-10-CM | POA: Diagnosis not present

## 2023-08-19 DIAGNOSIS — R2681 Unsteadiness on feet: Secondary | ICD-10-CM | POA: Diagnosis not present

## 2023-08-19 DIAGNOSIS — M6281 Muscle weakness (generalized): Secondary | ICD-10-CM | POA: Diagnosis not present

## 2023-08-19 DIAGNOSIS — M25562 Pain in left knee: Secondary | ICD-10-CM | POA: Diagnosis not present

## 2023-08-21 DIAGNOSIS — R2681 Unsteadiness on feet: Secondary | ICD-10-CM | POA: Diagnosis not present

## 2023-08-21 DIAGNOSIS — M6281 Muscle weakness (generalized): Secondary | ICD-10-CM | POA: Diagnosis not present

## 2023-08-21 DIAGNOSIS — M25562 Pain in left knee: Secondary | ICD-10-CM | POA: Diagnosis not present

## 2023-08-22 DIAGNOSIS — M25562 Pain in left knee: Secondary | ICD-10-CM | POA: Diagnosis not present

## 2023-08-22 DIAGNOSIS — R2681 Unsteadiness on feet: Secondary | ICD-10-CM | POA: Diagnosis not present

## 2023-08-22 DIAGNOSIS — M6281 Muscle weakness (generalized): Secondary | ICD-10-CM | POA: Diagnosis not present

## 2023-09-29 ENCOUNTER — Non-Acute Institutional Stay (SKILLED_NURSING_FACILITY): Payer: Self-pay | Admitting: Sports Medicine

## 2023-09-29 ENCOUNTER — Encounter: Payer: Self-pay | Admitting: Sports Medicine

## 2023-09-29 DIAGNOSIS — G3184 Mild cognitive impairment, so stated: Secondary | ICD-10-CM

## 2023-09-29 DIAGNOSIS — I10 Essential (primary) hypertension: Secondary | ICD-10-CM | POA: Diagnosis not present

## 2023-09-29 DIAGNOSIS — M159 Polyosteoarthritis, unspecified: Secondary | ICD-10-CM | POA: Diagnosis not present

## 2023-09-29 DIAGNOSIS — M150 Primary generalized (osteo)arthritis: Secondary | ICD-10-CM

## 2023-09-29 DIAGNOSIS — K219 Gastro-esophageal reflux disease without esophagitis: Secondary | ICD-10-CM | POA: Diagnosis not present

## 2023-09-29 DIAGNOSIS — E559 Vitamin D deficiency, unspecified: Secondary | ICD-10-CM | POA: Diagnosis not present

## 2023-09-29 NOTE — Progress Notes (Signed)
Provider:  Venita Sheffield MD Location:   [facility]   Place of Service:   Assisted living  905[address]  PCP: Mahlon Gammon, MD Patient Care Team: Mahlon Gammon, MD as PCP - General (Internal Medicine) Jodi Geralds, MD as Consulting Physician (Orthopedic Surgery)  Extended Emergency Contact Information Primary Emergency Contact: Riley Lam Address: [address]          [address]          [place], [place] [address] Darden Amber of [place] Phone: [phone] Relation: Relative Secondary Emergency Contact: Trinidad Curet Address: [address]          [place], [place] [address] Darden Amber of [place] Home Phone: [phone] Mobile Phone: [phone] Relation: Relative  Code Status:  Goals of Care: Advanced Directive information    08/08/2023   10:49 AM  Advanced Directives  Does Patient Have a Medical Advance Directive? Yes  Type of Estate agent of [place];Living will;Out of facility DNR (pink MOST or yellow form)  Does patient want to make changes to medical advance directive? No - Patient declined  Copy of Healthcare Power of Attorney in Chart? Yes - validated most recent copy scanned in chart (See row information)  Pre-existing out of facility DNR order (yellow form or pink MOST form) Pink MOST form placed in chart (order not valid for inpatient use);Yellow form placed in chart (order not valid for inpatient use)      No chief complaint on file.  LATE NOTE - pt seen  9/27 /24   HPI: Patient is a 89 y.o. female seen today for routine visit for follow up on chronic issues. Pt seen and examined in her room, she seems to be pleasant. States she is doing fine.  Has no concerns today.  She had covid infection in August. Denies fevers, chills, cough, SOB, abdominal pain, nausea, vomiting, dysuria, hematuria, bloody or dark stools.    Past Medical History:  Diagnosis Date   Aortic heart murmur    AS/AI on 2D ECHO ; SBE  prophylaxis Rxed   Chest pain 2007 ; 2011   negative cardiac assessment   GERD (gastroesophageal reflux disease)    Hyperlipidemia    Hypertension [   Interstitial cystitis    Resolved   Ocular migraine 04/10/2012   Osteoporosis    T score - 3.0 @ femoral neck; Fosamax affected swallowing   Peripheral vascular disease (HCC)    Postmenopausal    No PMH or HRT   Past Surgical History:  Procedure Laterality Date   ANTERIOR APPROACH HEMI HIP ARTHROPLASTY Right 09/03/2019   Procedure: ANTERIOR APPROACH HEMI HIP ARTHROPLASTY BIPOLAR;  Surgeon: Jodi Geralds, MD;  Location: [facility];  Service: Orthopedics;  Laterality: Right;   COLONOSCOPY  2011   Dr Jarold Motto   HIP ARTHROPLASTY Left 10/11/2020   Procedure: ARTHROPLASTY BIPOLAR HIP (HEMIARTHROPLASTY) ANTERIOR APPROACH;  Surgeon: Jodi Geralds, MD;  Location: [facility];  Service: Orthopedics;  Laterality: Left;    reports that she has never smoked. She has never used smokeless tobacco. She reports that she does not drink alcohol and does not use drugs. Social History   Socioeconomic History   Marital status: Widowed    Spouse name: Not on file   Number of children: 1   Years of education: Not on file   Highest education level: Not on file  Occupational History   Not on file  Tobacco Use   Smoking status: Never   Smokeless tobacco: Never  Substance and Sexual Activity   Alcohol use: No   Drug use: No   Sexual activity: Not Currently    Birth control/protection: None  Other Topics Concern   Not on file  Social History Narrative   Not on file   Social Determinants of Health   Financial Resource Strain: Not on file  Food Insecurity: Not on file  Transportation Needs: Not on file  Physical Activity: Not on file  Stress: Not on file  Social Connections: Not on file  Intimate Partner Violence: Not on file    Functional Status Survey:    Family History  Problem Relation Age of Onset   Diabetes Father        Pancreatitic  insufficiency post MVA   Hypertension Mother    Osteoporosis Mother    Heart disease Mother        AF   Thyroid disease Sister        hypothyroidism   Coronary artery disease Sister        AF   Stroke Sister 58       also pulmonary disease    Health Maintenance  Topic Date Due   INFLUENZA VACCINE  07/31/2023   COVID-19 Vaccine (8 - 2023-24 season) 08/31/2023   DTaP/Tdap/Td (3 - Td or Tdap) 07/11/2025   Pneumonia Vaccine 91+ Years old  Completed   DEXA SCAN  Completed   Zoster Vaccines- Shingrix  Completed   HPV VACCINES  Aged Out    Allergies  Allergen Reactions   Amitid [Amitriptyline Hcl]     Patient experienced a dizzy, spinning feeling    Metronidazole     REACTION: throat swells   Penicillins     REACTION: rash & fever   Alendronate Sodium     REACTION: unspecified   Amoxicillin Nausea And Vomiting   Clarithromycin     GI intolerance   Flonase [Fluticasone Propionate]     Slight Headache    Promethazine Hcl     REACTION: unspecified   Robitussin (Alcohol Free) [Guaifenesin] Other (See Comments)    unknown hyperactive   Spironolactone     09/21/13 weakness in legs   Singulair [Montelukast Sodium]     "spacey"; ineffective    Outpatient Encounter Medications as of 09/29/2023  Medication Sig   acetaminophen (TYLENOL) 325 MG tablet Take 650 mg by mouth in the morning and at bedtime. Not to exceed 3000 mg per 24 hours.   acetaminophen (TYLENOL) 500 MG tablet Take 500 mg by mouth every 8 (eight) hours as needed for moderate pain.   amLODipine (NORVASC) 10 MG tablet Take 1 tablet (10 mg total) by mouth daily.   aspirin EC 81 MG tablet Take 81 mg by mouth daily. Swallow whole.   docusate sodium (COLACE) 100 MG capsule Take 1 capsule (100 mg total) by mouth 2 (two) times daily as needed (for constipation).   fexofenadine (ALLEGRA) 180 MG tablet Take 180 mg by mouth daily as needed for allergies or rhinitis.    furosemide (LASIX) 40 MG tablet Take 1 tablet (40 mg  total) by mouth daily.   lidocaine (LIDODERM) 5 % Place 1 patch onto the skin daily. Remove & Discard patch within 12 hours or as directed by MD   Menthol, Topical Analgesic, (BIOFREEZE) 4 % GEL Apply 1 application topically 2 (two) times daily as needed (left knee pain).    metoprolol succinate (TOPROL-XL) 25 MG 24 hr tablet TAKE 1.5 TABLETS (37.5 MG TOTAL) BY MOUTH DAILY.  Multiple Vitamins-Minerals (MULTIVITAMIN ADULT) CHEW Chew 400 mg by mouth daily.   omeprazole (PRILOSEC) 20 MG capsule Take 20 mg by mouth daily.   polyethylene glycol (MIRALAX / GLYCOLAX) 17 g packet Take 17 g by mouth daily.   potassium chloride SA (KLOR-CON) 20 MEQ tablet Take 20 mEq by mouth daily.   No facility-administered encounter medications on file as of 09/29/2023.    Review of Systems  Constitutional:  Negative for chills and fever.  HENT:  Negative for sinus pressure and sore throat.   Respiratory:  Negative for cough, shortness of breath and wheezing.   Cardiovascular:  Negative for chest pain, palpitations and leg swelling.  Gastrointestinal:  Negative for abdominal distention, abdominal pain, blood in stool, constipation, diarrhea, nausea and vomiting.  Genitourinary:  Negative for dysuria, frequency and urgency.  Neurological:  Negative for dizziness, weakness and numbness.  Psychiatric/Behavioral:  Negative for confusion.     There were no vitals filed for this visit. There is no height or weight on file to calculate BMI. Physical Exam Constitutional:      Appearance: Normal appearance.  HENT:     Head: Normocephalic and atraumatic.  Cardiovascular:     Rate and Rhythm: Normal rate and regular rhythm.     Heart sounds: No murmur heard. Pulmonary:     Effort: Pulmonary effort is normal. No respiratory distress.     Breath sounds: Normal breath sounds. No wheezing.  Abdominal:     General: Bowel sounds are normal. There is no distension.     Tenderness: There is no abdominal tenderness. There  is no guarding or rebound.  Musculoskeletal:        General: No swelling or tenderness.  Skin:    General: Skin is dry.  Neurological:     Mental Status: She is alert. Mental status is at baseline.     Sensory: No sensory deficit.     Motor: No weakness.     Labs reviewed: Basic Metabolic Panel: Recent Labs    01/03/23 0000  NA 139  K 4.5  CL 103  CO2 30*  BUN 13  CREATININE 0.5  CALCIUM 9.6   Liver Function Tests: Recent Labs    01/03/23 0000  AST 11*  ALT 12  ALKPHOS 91  ALBUMIN 3.8   No results for input(s): "LIPASE", "AMYLASE" in the last 8760 hours. No results for input(s): "AMMONIA" in the last 8760 hours. CBC: Recent Labs    01/03/23 0000  WBC 6.1  NEUTROABS 4,069.00  HGB 12.4  HCT 37  PLT 295   Cardiac Enzymes: No results for input(s): "CKTOTAL", "CKMB", "CKMBINDEX", "TROPONINI" in the last 8760 hours. BNP: Invalid input(s): "POCBNP" Lab Results  Component Value Date   HGBA1C 5.5 07/12/2015   Lab Results  Component Value Date   TSH 1.53 08/22/2022   Lab Results  Component Value Date   VITAMINB12 562 08/22/2022   Lab Results  Component Value Date   FOLATE >20.0 ng/mL 07/27/2010   Lab Results  Component Value Date   IRON 81 07/27/2010   FERRITIN 50.5 04/17/2010    Imaging and Procedures obtained prior to SNF admission: CT Head Wo Contrast  Result Date: 04/08/2021 CLINICAL DATA:  Head trauma, moderate to severe. EXAM: CT HEAD WITHOUT CONTRAST TECHNIQUE: Contiguous axial images were obtained from the base of the skull through the vertex without intravenous contrast. COMPARISON:  Maxillofacial CT April 27, 2013 FINDINGS: Brain: No evidence of acute infarction, hemorrhage, hydrocephalus, extra-axial collection or mass lesion/mass  effect. Moderate brain parenchymal volume loss and deep white matter microangiopathy. Vascular: No hyperdense vessel or unexpected calcification. Skull: Normal. Negative for fracture or focal lesion.  Sinuses/Orbits: No acute finding. Other: Large right occipital scalp hematoma. IMPRESSION: 1. No acute intracranial abnormality. 2. Moderate brain parenchymal atrophy and chronic microvascular disease. 3. Large right occipital scalp hematoma. Electronically Signed   By: Ted Mcalpine M.D.   On: 04/08/2021 16:07    Assessment/Plan 1. Primary osteoarthritis involving multiple joints Cont with tylenol , lidocaine patch Increase physical activity    2. Essential hypertension Bp at goal  134/68 Cont with  amlodipine, metoprolol    Gastroesophageal reflux disease without esophagitis Cont with omeprazole Avoid laying on bed immediately after eating food Slight head elevation  Vitamin D deficiency Cont with vit d supplements    Mild cognitive impairment Increase physical activity  and cognitively engaging activities    Family/ staff Communication: care plan discussed with the nurse  Labs/tests ordered: none

## 2023-10-15 DIAGNOSIS — Z23 Encounter for immunization: Secondary | ICD-10-CM | POA: Diagnosis not present

## 2023-10-28 ENCOUNTER — Encounter: Payer: Self-pay | Admitting: Nurse Practitioner

## 2023-10-28 ENCOUNTER — Non-Acute Institutional Stay: Payer: MEDICARE | Admitting: Nurse Practitioner

## 2023-10-28 DIAGNOSIS — M810 Age-related osteoporosis without current pathological fracture: Secondary | ICD-10-CM | POA: Diagnosis not present

## 2023-10-28 DIAGNOSIS — Z0000 Encounter for general adult medical examination without abnormal findings: Secondary | ICD-10-CM

## 2023-10-28 NOTE — Progress Notes (Unsigned)
Location:   [facility] Room Number: 905-A Place of Service:  ALF [phone]) Provider: Chipper Oman NP  Patient Care Team: Mahlon Gammon, MD as PCP - General (Internal Medicine) Jodi Geralds, MD as Consulting Physician (Orthopedic Surgery)  Extended Emergency Contact Information Primary Emergency Contact: Riley Lam Address: [address]          [address]          [place], [place] [address] Darden [place] of [place] Phone: [phone] Relation: Relative Secondary Emergency Contact: Trinidad Curet Address: [address]          [place], [place] [address] Darden [place] of [place] Home Phone: [phone] Mobile Phone: [phone] Relation: Relative  Code Status: DNR Goals of Care: Advanced Directive information    10/28/2023   11:55 AM  Advanced Directives  Does Patient Have a Medical Advance Directive? Yes  Type of Estate agent of [place];Out of facility DNR (pink MOST or yellow form);Living will  Does patient want to make changes to medical advance directive? No - Patient declined  Copy of Healthcare Power of Attorney in Chart? Yes - validated most recent copy scanned in chart (See row information)  Pre-existing out of facility DNR order (yellow form or pink MOST form) Yellow form placed in chart (order not valid for inpatient use)     Chief Complaint  Patient presents with   Medicare Wellness    AWV    HPI: Patient is a 89 y.o. female seen in today for an annual wellness exam.       11/03/2019    1:42 PM 09/23/2018    2:59 PM 09/19/2017    2:42 PM 08/20/2016    3:28 PM 12/20/2014    4:36 PM  Depression screen PHQ 2/9  Decreased Interest 0 0 0 0 0  Down, Depressed, Hopeless 0 0 0 0 0  PHQ - 2 Score 0 0 0 0 0  Altered sleeping 0      Tired, decreased energy 0      Change in appetite 0      Feeling bad or failure about yourself  0      Trouble concentrating 0      Moving slowly or fidgety/restless 0       Suicidal thoughts 0      PHQ-9 Score 0      Difficult doing work/chores Not difficult at all           10/13/2020    1:00 AM 10/13/2020    8:15 AM 04/08/2021    2:44 PM 01/07/2023    9:34 AM 06/18/2023    9:16 AM  Fall Risk  Falls in the past year?    0 1  Was there an injury with Fall?    0 0  Fall Risk Category Calculator    0 1  Fall Risk Category (Retired)    Low   (RETIRED) Patient Fall Risk Level High fall risk High fall risk High fall risk Low fall risk   Patient at Risk for Falls Due to    No Fall Risks History of fall(s);Impaired balance/gait;Impaired mobility;Impaired vision  Fall risk Follow up    Falls evaluation completed Falls evaluation completed  Fall risk Follow up - Comments     High Fall Risk      12/27/2021   10:32 AM 09/23/2018    3:07 PM 09/19/2017    2:47 PM 08/20/2016    3:29 PM  MMSE -  Mini Mental State Exam  Not completed:  -- -- --  Orientation to time 5     Orientation to Place 5     Registration 3     Attention/ Calculation 5     Recall 3     Language- name 2 objects 2     Language- repeat 1     Language- follow 3 step command 3     Language- read & follow direction 1     Write a sentence 1     Copy design 1     Total score 30        Health Maintenance  Topic Date Due   COVID-19 Vaccine (9 - 2023-24 season) 12/10/2023   DTaP/Tdap/Td (3 - Td or Tdap) 07/11/2025   Pneumonia Vaccine 102+ Years old  Completed   INFLUENZA VACCINE  Completed   DEXA SCAN  Completed   Zoster Vaccines- Shingrix  Completed   HPV VACCINES  Aged Out    Urinary incontinence? Functional Status Survey:   Exercise? Diet? No results found. Dentition: Pain:  Past Medical History:  Diagnosis Date   Aortic heart murmur    AS/AI on 2D ECHO ; SBE prophylaxis Rxed   Chest pain 2007 ; 2011   negative cardiac assessment   GERD (gastroesophageal reflux disease)    Hyperlipidemia    Hypertension [   Interstitial cystitis    Resolved   Ocular migraine 04/10/2012    Osteoporosis    T score - 3.0 @ femoral neck; Fosamax affected swallowing   Peripheral vascular disease (HCC)    Postmenopausal    No PMH or HRT    Past Surgical History:  Procedure Laterality Date   ANTERIOR APPROACH HEMI HIP ARTHROPLASTY Right 09/03/2019   Procedure: ANTERIOR APPROACH HEMI HIP ARTHROPLASTY BIPOLAR;  Surgeon: Jodi Geralds, MD;  Location: [facility];  Service: Orthopedics;  Laterality: Right;   COLONOSCOPY  2011   Dr Jarold Motto   HIP ARTHROPLASTY Left 10/11/2020   Procedure: ARTHROPLASTY BIPOLAR HIP (HEMIARTHROPLASTY) ANTERIOR APPROACH;  Surgeon: Jodi Geralds, MD;  Location: [facility];  Service: Orthopedics;  Laterality: Left;    The patient has a family history of  shoulder Social History   Socioeconomic History   Marital status: Widowed    Spouse name: Not on file   Number of children: 1   Years of education: Not on file   Highest education level: Not on file  Occupational History   Not on file  Tobacco Use   Smoking status: Never   Smokeless tobacco: Never  Substance and Sexual Activity   Alcohol use: No   Drug use: No   Sexual activity: Not Currently    Birth control/protection: None  Other Topics Concern   Not on file  Social History Narrative   Not on file   Social Determinants of Health   Financial Resource Strain: Not on file  Food Insecurity: Not on file  Transportation Needs: Not on file  Physical Activity: Not on file  Stress: Not on file  Social Connections: Not on file  Intimate Partner Violence: Not on file    Allergies  Allergen Reactions   Amitid [Amitriptyline Hcl]     Patient experienced a dizzy, spinning feeling    Metronidazole     REACTION: throat swells   Penicillins     REACTION: rash & fever   Alendronate Sodium     REACTION: unspecified   Amoxicillin Nausea And Vomiting   Clarithromycin     GI  intolerance   Flonase [Fluticasone Propionate]     Slight Headache    Promethazine Hcl     REACTION: unspecified    Robitussin (Alcohol Free) [Guaifenesin] Other (See Comments)    unknown hyperactive   Spironolactone     09/21/13 weakness in legs   Singulair [Montelukast Sodium]     "spacey"; ineffective    Allergies as of 10/28/2023       Reactions   Amitid [amitriptyline Hcl]    Patient experienced a dizzy, spinning feeling    Metronidazole    REACTION: throat swells   Penicillins    REACTION: rash & fever   Alendronate Sodium    REACTION: unspecified   Amoxicillin Nausea And Vomiting   Clarithromycin    GI intolerance   Flonase [fluticasone Propionate]    Slight Headache    Promethazine Hcl    REACTION: unspecified   Robitussin (alcohol Free) [guaifenesin] Other (See Comments)   unknown hyperactive   Spironolactone    09/21/13 weakness in legs   Singulair [montelukast Sodium]    "spacey"; ineffective        Medication List        Accurate as of October 28, 2023 12:06 PM. If you have any questions, ask your nurse or doctor.          acetaminophen 500 MG tablet Commonly known as: TYLENOL Take 500 mg by mouth every 8 (eight) hours as needed for moderate pain.   acetaminophen 325 MG tablet Commonly known as: TYLENOL Take 650 mg by mouth in the morning and at bedtime. Not to exceed 3000 mg per 24 hours.   amLODipine 10 MG tablet Commonly known as: NORVASC Take 1 tablet (10 mg total) by mouth daily.   aspirin EC 81 MG tablet Take 81 mg by mouth daily. Swallow whole.   Biofreeze 4 % Gel Generic drug: Menthol (Topical Analgesic) Apply 1 application topically 2 (two) times daily as needed (left knee pain).   docusate sodium 100 MG capsule Commonly known as: COLACE Take 1 capsule (100 mg total) by mouth 2 (two) times daily as needed (for constipation).   fexofenadine 180 MG tablet Commonly known as: ALLEGRA Take 180 mg by mouth daily as needed for allergies or rhinitis.   furosemide 40 MG tablet Commonly known as: LASIX Take 1 tablet (40 mg total) by mouth  daily.   lidocaine 5 % Commonly known as: LIDODERM Place 1 patch onto the skin daily. Remove & Discard patch within 12 hours or as directed by MD   metoprolol succinate 25 MG 24 hr tablet Commonly known as: TOPROL-XL TAKE 1.5 TABLETS (37.5 MG TOTAL) BY MOUTH DAILY.   Multivitamin Adult Chew Chew 400 mg by mouth daily.   omeprazole 20 MG capsule Commonly known as: PRILOSEC Take 20 mg by mouth daily.   polyethylene glycol 17 g packet Commonly known as: MIRALAX / GLYCOLAX Take 17 g by mouth daily.   potassium chloride SA 20 MEQ tablet Commonly known as: KLOR-CON M Take 20 mEq by mouth daily.         Review of Systems:  Review of Systems  Physical Exam: Vitals:   10/28/23 1148  BP: 138/67  Pulse: 68  Resp: 18  Temp: 97.9 F (36.6 C)  SpO2: 96%  Weight: 127 lb 9.6 oz (57.9 kg)  Height: 5\' 4"  (1.626 m)   Body mass index is 21.9 kg/m. Physical Exam  Labs reviewed: Basic Metabolic Panel: Recent Labs    01/03/23 0000  NA  139  K 4.5  CL 103  CO2 30*  BUN 13  CREATININE 0.5  CALCIUM 9.6   Liver Function Tests: Recent Labs    01/03/23 0000  AST 11*  ALT 12  ALKPHOS 91  ALBUMIN 3.8   No results for input(s): "LIPASE", "AMYLASE" in the last 8760 hours. No results for input(s): "AMMONIA" in the last 8760 hours. CBC: Recent Labs    01/03/23 0000  WBC 6.1  NEUTROABS 4,069.00  HGB 12.4  HCT 37  PLT 295   Lipid Panel: No results for input(s): "CHOL", "HDL", "LDLCALC", "TRIG", "CHOLHDL", "LDLDIRECT" in the last 8760 hours. Lab Results  Component Value Date   HGBA1C 5.5 07/12/2015    Procedures: No results found.  Assessment/Plan  No problem-specific Assessment & Plan notes found for this encounter.   Labs/tests ordered:  * No order type specified * Next appt:  @NEXTENCTHIS  DEPT@

## 2023-10-29 ENCOUNTER — Encounter: Payer: Self-pay | Admitting: Nurse Practitioner

## 2023-12-16 ENCOUNTER — Non-Acute Institutional Stay: Payer: MEDICARE | Admitting: Nurse Practitioner

## 2023-12-16 ENCOUNTER — Encounter: Payer: Self-pay | Admitting: Nurse Practitioner

## 2023-12-16 DIAGNOSIS — D649 Anemia, unspecified: Secondary | ICD-10-CM

## 2023-12-16 DIAGNOSIS — E559 Vitamin D deficiency, unspecified: Secondary | ICD-10-CM | POA: Diagnosis not present

## 2023-12-16 DIAGNOSIS — I10 Essential (primary) hypertension: Secondary | ICD-10-CM | POA: Diagnosis not present

## 2023-12-16 DIAGNOSIS — I872 Venous insufficiency (chronic) (peripheral): Secondary | ICD-10-CM | POA: Diagnosis not present

## 2023-12-16 DIAGNOSIS — F32A Depression, unspecified: Secondary | ICD-10-CM | POA: Diagnosis not present

## 2023-12-16 DIAGNOSIS — M150 Primary generalized (osteo)arthritis: Secondary | ICD-10-CM

## 2023-12-16 DIAGNOSIS — K219 Gastro-esophageal reflux disease without esophagitis: Secondary | ICD-10-CM

## 2023-12-16 DIAGNOSIS — G3184 Mild cognitive impairment, so stated: Secondary | ICD-10-CM | POA: Diagnosis not present

## 2023-12-16 DIAGNOSIS — K5901 Slow transit constipation: Secondary | ICD-10-CM | POA: Diagnosis not present

## 2023-12-16 DIAGNOSIS — F419 Anxiety disorder, unspecified: Secondary | ICD-10-CM

## 2023-12-16 NOTE — Assessment & Plan Note (Addendum)
blood pressure is controlled, on amlodipine Metoprolol, Bun/creat 13/0.5 01/03/23, update CMP/eGFR, lipids.

## 2023-12-16 NOTE — Assessment & Plan Note (Signed)
S/p left bipolar hemiarthroplasty 10/12/20 for left hip fracture. Chronic left leg/knee pain, 07/02/23 X-ray left knee severe OA takes Tylenol.  Failed left knee brace, may try knee ortho sleeve.

## 2023-12-16 NOTE — Progress Notes (Unsigned)
Location:  [facility] Room Number: 905-A Place of Service:  ALF [phone]) Provider:  Kellon Chalk Hanley Seamen, MD  Patient Care Team: Mahlon Gammon, MD as PCP - General (Internal Medicine) Jodi Geralds, MD as Consulting Physician (Orthopedic Surgery)  Extended Emergency Contact Information Primary Emergency Contact: Riley Lam Address: [address]          [address]          [place], [place] [address] Darden Amber of [place] Phone: [phone] Relation: Relative Secondary Emergency Contact: Trinidad Curet Address: [address]          [place], [place] [address] Darden Amber of [place] Home Phone: [phone] Mobile Phone: [phone] Relation: Relative  Code Status:  DNR Goals of care: Advanced Directive information    12/16/2023    9:30 AM  Advanced Directives  Does Patient Have a Medical Advance Directive? Yes  Type of Estate agent of [place];Living will;Out of facility DNR (pink MOST or yellow form)  Does patient want to make changes to medical advance directive? No - Guardian declined  Copy of Healthcare Power of Attorney in Chart? Yes - validated most recent copy scanned in chart (See row information)  Pre-existing out of facility DNR order (yellow form or pink MOST form) Pink MOST/Yellow Form most recent copy in chart - Physician notified to receive inpatient order     Chief Complaint  Patient presents with   Routine    HPI:  Pt is a 89 y.o. female seen today for medical management of chronic diseases.    Anemia, Hgb 12.4 01/03/23             OA, S/p left bipolar hemiarthroplasty 10/12/20 for left hip fracture. Chronic left leg/knee pain, 07/02/23 X-ray left knee severe OA takes Tylenol.              Vit D deficiency, Vit D level 23 01/03/23, started Vit D 2000u qd.              HTN, blood pressure is controlled, on amlodipine Metoprolol, Bun/creat 13/0.5 01/03/23             CHF/PVD, peripheral edema is  mild left foot, on Furosemide. Cardiology f/u             GERD, stable, on Omeprazole.              Depression/anxiety, lifelong issue, but sleeps/eats at her baseline. TSH 1.53 08/22/22             Constipation, takes MiraLax              Cognitive impairment, functioning well [facility], TSH 1.53, Vit B12 562 08/22/22     Past Medical History:  Diagnosis Date   Aortic heart murmur    AS/AI on 2D ECHO ; SBE prophylaxis Rxed   Chest pain 2007 ; 2011   negative cardiac assessment   GERD (gastroesophageal reflux disease)    Hyperlipidemia    Hypertension [   Interstitial cystitis    Resolved   Ocular migraine 04/10/2012   Osteoporosis    T score - 3.0 @ femoral neck; Fosamax affected swallowing   Peripheral vascular disease (HCC)    Postmenopausal    No PMH or HRT   Past Surgical History:  Procedure Laterality Date   ANTERIOR APPROACH HEMI HIP ARTHROPLASTY Right 09/03/2019   Procedure: ANTERIOR APPROACH HEMI HIP ARTHROPLASTY BIPOLAR;  Surgeon: Jodi Geralds, MD;  Location: [facility];  Service: Orthopedics;  Laterality: Right;   COLONOSCOPY  2011   Dr Jarold Motto   HIP ARTHROPLASTY Left 10/11/2020   Procedure: ARTHROPLASTY BIPOLAR HIP (HEMIARTHROPLASTY) ANTERIOR APPROACH;  Surgeon: Jodi Geralds, MD;  Location: [facility];  Service: Orthopedics;  Laterality: Left;    Allergies  Allergen Reactions   Amitid [Amitriptyline Hcl]     Patient experienced a dizzy, spinning feeling    Metronidazole     REACTION: throat swells   Penicillins     REACTION: rash & fever   Alendronate Sodium     REACTION: unspecified   Amoxicillin Nausea And Vomiting   Clarithromycin     GI intolerance   Flonase [Fluticasone Propionate]     Slight Headache    Promethazine Hcl     REACTION: unspecified   Robitussin (Alcohol Free) [Guaifenesin] Other (See Comments)    unknown hyperactive   Spironolactone     09/21/13 weakness in legs   Singulair [Montelukast Sodium]     "spacey"; ineffective    Outpatient  Encounter Medications as of 12/16/2023  Medication Sig   acetaminophen (TYLENOL) 325 MG tablet Take 650 mg by mouth in the morning and at bedtime. Not to exceed 3000 mg per 24 hours.   acetaminophen (TYLENOL) 500 MG tablet Take 500 mg by mouth every 8 (eight) hours as needed for moderate pain.   amLODipine (NORVASC) 10 MG tablet Take 1 tablet (10 mg total) by mouth daily.   aspirin EC 81 MG tablet Take 81 mg by mouth daily. Swallow whole.   Cholecalciferol (VITAMIN D) 50 MCG (2000 UT) tablet Take 2,000 Units by mouth daily.   docusate sodium (COLACE) 100 MG capsule Take 1 capsule (100 mg total) by mouth 2 (two) times daily as needed (for constipation).   fexofenadine (ALLEGRA) 180 MG tablet Take 180 mg by mouth daily as needed for allergies or rhinitis.    furosemide (LASIX) 40 MG tablet Take 1 tablet (40 mg total) by mouth daily.   lidocaine (LIDODERM) 5 % Place 1 patch onto the skin daily. Remove & Discard patch within 12 hours or as directed by MD   Menthol, Topical Analgesic, (BIOFREEZE) 4 % GEL Apply 1 application topically 2 (two) times daily as needed (left knee pain).    metoprolol succinate (TOPROL-XL) 25 MG 24 hr tablet TAKE 1.5 TABLETS (37.5 MG TOTAL) BY MOUTH DAILY.   Multiple Vitamins-Minerals (MULTIVITAMIN ADULT) CHEW Chew 400 mg by mouth daily.   omeprazole (PRILOSEC) 20 MG capsule Take 20 mg by mouth daily.   polyethylene glycol (MIRALAX / GLYCOLAX) 17 g packet Take 17 g by mouth daily.   potassium chloride SA (KLOR-CON) 20 MEQ tablet Take 20 mEq by mouth daily.   No facility-administered encounter medications on file as of 12/16/2023.    Review of Systems  Constitutional:  Negative for appetite change, fatigue and fever.  HENT:  Positive for hearing loss. Negative for congestion and trouble swallowing.   Eyes:  Negative for visual disturbance.  Respiratory:  Negative for cough and wheezing.   Cardiovascular:  Positive for leg swelling.  Gastrointestinal:  Negative for  abdominal pain and constipation.       GERD  Genitourinary:  Positive for frequency. Negative for dysuria and urgency.  Musculoskeletal:  Positive for arthralgias and gait problem.       Knee pain, mostly in the left, ambulates with walker  Skin:  Negative for color change.  Neurological:  Negative for speech difficulty, weakness and light-headedness.  Memory lapses.   Psychiatric/Behavioral:  Negative for behavioral problems and sleep disturbance. The patient is not nervous/anxious.     Immunization History  Administered Date(s) Administered   Fluad Quad(high Dose 65+) 10/05/2019, 10/21/2022   Influenza Split 12/10/2011, 09/29/2012   Influenza, High Dose Seasonal PF 09/21/2014, 08/27/2016, 08/27/2017, 09/23/2018   Influenza,inj,Quad PF,6+ Mos 10/08/2013, 09/12/2015   Influenza-Unspecified 10/27/2020, 10/18/2021, 10/01/2023   Moderna Covid-19 Vaccine Bivalent Booster 45yrs & up 10/15/2023   Moderna Sars-Covid-2 Vaccination 11/07/2020   PFIZER(Purple Top)SARS-COV-2 Vaccination 02/28/2020, 03/29/2020, 11/07/2020, 05/29/2021, 09/18/2021   Pfizer Covid-19 Vaccine Bivalent Booster 69yrs & up 10/31/2022   Pneumococcal Conjugate-13 08/22/2015   Pneumococcal Polysaccharide-23 11/23/2008   Td 03/23/2004   Tdap 07/12/2015   Zoster Recombinant(Shingrix) 01/01/2022, 03/26/2022   Pertinent  Health Maintenance Due  Topic Date Due   INFLUENZA VACCINE  Completed   DEXA SCAN  Completed      10/13/2020    1:00 AM 10/13/2020    8:15 AM 04/08/2021    2:44 PM 01/07/2023    9:34 AM 06/18/2023    9:16 AM  Fall Risk  Falls in the past year?    0 1  Was there an injury with Fall?    0 0  Fall Risk Category Calculator    0 1  Fall Risk Category (Retired)    Low   (RETIRED) Patient Fall Risk Level High fall risk High fall risk High fall risk Low fall risk   Patient at Risk for Falls Due to    No Fall Risks History of fall(s);Impaired balance/gait;Impaired mobility;Impaired vision  Fall risk  Follow up    Falls evaluation completed Falls evaluation completed  Fall risk Follow up - Comments     High Fall Risk   Functional Status Survey:    Vitals:   12/16/23 0923  BP: 136/64  Pulse: 75  Resp: 18  Temp: (!) 97.2 F (36.2 C)  SpO2: 97%  Weight: 128 lb 6.4 oz (58.2 kg)  Height: 5\' 4"  (1.626 m)   Body mass index is 22.04 kg/m. Physical Exam Vitals and nursing note reviewed.  Constitutional:      Appearance: Normal appearance.  HENT:     Head: Normocephalic and atraumatic.     Nose: Nose normal.     Mouth/Throat:     Mouth: Mucous membranes are moist.  Eyes:     Extraocular Movements: Extraocular movements intact.     Conjunctiva/sclera: Conjunctivae normal.     Pupils: Pupils are equal, round, and reactive to light.  Cardiovascular:     Rate and Rhythm: Normal rate.     Heart sounds: No murmur heard. Pulmonary:     Effort: Pulmonary effort is normal.     Breath sounds: No rales.  Abdominal:     General: Bowel sounds are normal.     Palpations: Abdomen is soft.     Tenderness: There is no abdominal tenderness.  Musculoskeletal:     Cervical back: Normal range of motion and neck supple.     Right lower leg: No edema.     Left lower leg: Edema present.     Comments: Trace edema LLE Medial left knee is enlarged, slightly warmer than the right, no redness, ballottement, or reduced ROM.  Skin:    General: Skin is warm and dry.  Neurological:     General: No focal deficit present.     Mental Status: She is alert and oriented to person, place, and time. Mental status is at baseline.  Gait: Gait abnormal.  Psychiatric:        Mood and Affect: Mood normal.        Behavior: Behavior normal.        Thought Content: Thought content normal.        Judgment: Judgment normal.     Comments: Admitted feeling depressed or sad at times, but declined antidepressant, stated she sleeps well, has supportive family, is happy at [facility].      Labs  reviewed: Recent Labs    01/03/23 0000  NA 139  K 4.5  CL 103  CO2 30*  BUN 13  CREATININE 0.5  CALCIUM 9.6   Recent Labs    01/03/23 0000  AST 11*  ALT 12  ALKPHOS 91  ALBUMIN 3.8   Recent Labs    01/03/23 0000  WBC 6.1  NEUTROABS 4,069.00  HGB 12.4  HCT 37  PLT 295   Lab Results  Component Value Date   TSH 1.53 08/22/2022   Lab Results  Component Value Date   HGBA1C 5.5 07/12/2015   Lab Results  Component Value Date   CHOL 211 (A) 08/22/2022   HDL 27 (A) 08/22/2022   LDLCALC 114 08/22/2022   LDLDIRECT 114.7 06/24/2013   TRIG 110 08/22/2022   CHOLHDL 3 06/24/2013    Significant Diagnostic Results in last 30 days:  No results found.  Assessment/Plan Osteoarthritis, multiple sites  S/p left bipolar hemiarthroplasty 10/12/20 for left hip fracture. Chronic left leg/knee pain, 07/02/23 X-ray left knee severe OA takes Tylenol.  Failed left knee brace, may try knee ortho sleeve.   ANEMIA, MILD Hgb 12.4 01/03/23, update CBC/diff  Vitamin D deficiency Vit D level 23 01/03/23, started Vit D 2000u qd. Repeat Vit D level. ````````````````````````  Essential hypertension blood pressure is controlled, on amlodipine Metoprolol, Bun/creat 13/0.5 01/03/23, update CMP/eGFR, lipids.   Venous (peripheral) insufficiency blood pressure is controlled, on amlodipine Metoprolol, Bun/creat 13/0.5 01/03/23  Esophageal reflux  stable, on Omeprazole.   Anxiety and depression lifelong issue, but sleeps/eats at her baseline. TSH 1.53 08/22/22, repeat TSH  Slow transit constipation Stable,  takes MiraLax   Mild cognitive impairment functioning well [facility], TSH 1.53, Vit B12 562 08/22/22     Family/ staff Communication: plan of care reviewed with the patient and charge nurse.   Labs/tests ordered:  CBC/diff, CMP/eGFR, Vit D, TSH, lipids  Time spend 40 minutes.

## 2023-12-16 NOTE — Assessment & Plan Note (Signed)
Stable, takes MiraLax  

## 2023-12-16 NOTE — Assessment & Plan Note (Signed)
Vit D level 23 01/03/23, started Vit D 2000u qd. Repeat Vit D level. ````````````````````````

## 2023-12-16 NOTE — Assessment & Plan Note (Signed)
functioning well [facility], TSH 1.53, Vit B12 562 08/22/22 

## 2023-12-16 NOTE — Assessment & Plan Note (Signed)
Hgb 12.4 01/03/23, update CBC/diff

## 2023-12-16 NOTE — Assessment & Plan Note (Signed)
stable, on Omeprazole.  

## 2023-12-16 NOTE — Assessment & Plan Note (Signed)
blood pressure is controlled, on amlodipine Metoprolol, Bun/creat 13/0.5 01/03/23 

## 2023-12-16 NOTE — Assessment & Plan Note (Addendum)
lifelong issue, but sleeps/eats at her baseline. TSH 1.53 08/22/22, repeat TSH

## 2023-12-18 DIAGNOSIS — N183 Chronic kidney disease, stage 3 unspecified: Secondary | ICD-10-CM | POA: Diagnosis not present

## 2023-12-18 DIAGNOSIS — E785 Hyperlipidemia, unspecified: Secondary | ICD-10-CM | POA: Diagnosis not present

## 2023-12-18 DIAGNOSIS — I10 Essential (primary) hypertension: Secondary | ICD-10-CM | POA: Diagnosis not present

## 2024-03-29 ENCOUNTER — Non-Acute Institutional Stay: Payer: MEDICARE | Admitting: Sports Medicine

## 2024-03-29 DIAGNOSIS — M159 Polyosteoarthritis, unspecified: Secondary | ICD-10-CM | POA: Diagnosis not present

## 2024-03-29 DIAGNOSIS — E559 Vitamin D deficiency, unspecified: Secondary | ICD-10-CM

## 2024-03-29 DIAGNOSIS — I872 Venous insufficiency (chronic) (peripheral): Secondary | ICD-10-CM | POA: Diagnosis not present

## 2024-03-29 DIAGNOSIS — I10 Essential (primary) hypertension: Secondary | ICD-10-CM | POA: Diagnosis not present

## 2024-03-29 DIAGNOSIS — R4189 Other symptoms and signs involving cognitive functions and awareness: Secondary | ICD-10-CM | POA: Diagnosis not present

## 2024-03-29 NOTE — Progress Notes (Unsigned)
 Provider:  Dr. Venita Sheffield Location:  [facility] Place of Service:   Assisted living   PCP: Mahlon Gammon, MD Patient Care Team: Mahlon Gammon, MD as PCP - General (Internal Medicine) Jodi Geralds, MD as Consulting Physician (Orthopedic Surgery)  Extended Emergency Contact Information Primary Emergency Contact: Riley Lam Address: [address]          [address]          [place], [place] [address] Darden Amber of [place] Phone: [phone] Relation: Relative Secondary Emergency Contact: Trinidad Curet Address: [address]          [place], [place] [address] Darden Amber of [place] Home Phone: [phone] Mobile Phone: [phone] Relation: Relative  Goals of Care: Advanced Directive information    12/16/2023    9:30 AM  Advanced Directives  Does Patient Have a Medical Advance Directive? Yes  Type of Estate agent of [place];Living will;Out of facility DNR (pink MOST or yellow form)  Does patient want to make changes to medical advance directive? No - Guardian declined  Copy of Healthcare Power of Attorney in Chart? Yes - validated most recent copy scanned in chart (See row information)  Pre-existing out of facility DNR order (yellow form or pink MOST form) Pink MOST/Yellow Form most recent copy in chart - Physician notified to receive inpatient order       History of Present Illness           89 year old female with a past medical history of anemia, osteoarthritis, vitamin D deficiency, hypertension, CHF, GERD, depression is evaluated for the chronic disease management. Patient seen and examined in her room. Patient states she is doing fine has no concerns other than chronic bilateral knee pain. Patient ambulates with a walker.  Denies being dizzy, lightheaded No recent falls. Patient denies chest pain, palpitations, abdominal pain, nausea, vomiting, shortness of breath, dysuria, hematuria, bloody or dark-colored  stools. Patient is oriented to self, place but not time.  Patient states she enjoys activities and participates in them. Denies being depressed, anxious Reports she is sleeping fine and has no issues.  Past Medical History:  Diagnosis Date   Aortic heart murmur    AS/AI on 2D ECHO ; SBE prophylaxis Rxed   Chest pain 2007 ; 2011   negative cardiac assessment   GERD (gastroesophageal reflux disease)    Hyperlipidemia    Hypertension [   Interstitial cystitis    Resolved   Ocular migraine 04/10/2012   Osteoporosis    T score - 3.0 @ femoral neck; Fosamax affected swallowing   Peripheral vascular disease (HCC)    Postmenopausal    No PMH or HRT   Past Surgical History:  Procedure Laterality Date   ANTERIOR APPROACH HEMI HIP ARTHROPLASTY Right 09/03/2019   Procedure: ANTERIOR APPROACH HEMI HIP ARTHROPLASTY BIPOLAR;  Surgeon: Jodi Geralds, MD;  Location: [facility];  Service: Orthopedics;  Laterality: Right;   COLONOSCOPY  2011   Dr Jarold Motto   HIP ARTHROPLASTY Left 10/11/2020   Procedure: ARTHROPLASTY BIPOLAR HIP (HEMIARTHROPLASTY) ANTERIOR APPROACH;  Surgeon: Jodi Geralds, MD;  Location: [facility];  Service: Orthopedics;  Laterality: Left;    reports that she has never smoked. She has never used smokeless tobacco. She reports that she does not drink alcohol and does not use drugs. Social History   Socioeconomic History   Marital status: Widowed    Spouse name: Not on file   Number of children: 1   Years of education: Not  on file   Highest education level: Not on file  Occupational History   Not on file  Tobacco Use   Smoking status: Never   Smokeless tobacco: Never  Substance and Sexual Activity   Alcohol use: No   Drug use: No   Sexual activity: Not Currently    Birth control/protection: None  Other Topics Concern   Not on file  Social History Narrative   Not on file   Social Drivers of Health   Financial Resource Strain: Not on file  Food Insecurity: Not on file   Transportation Needs: Not on file  Physical Activity: Not on file  Stress: Not on file  Social Connections: Not on file  Intimate Partner Violence: Not on file    Functional Status Survey:    Family History  Problem Relation Age of Onset   Diabetes Father        Pancreatitic insufficiency post MVA   Hypertension Mother    Osteoporosis Mother    Heart disease Mother        AF   Thyroid disease Sister        hypothyroidism   Coronary artery disease Sister        AF   Stroke Sister 25       also pulmonary disease    Health Maintenance  Topic Date Due   COVID-19 Vaccine (9 - 2024-25 season) 12/10/2023   DTaP/Tdap/Td (3 - Td or Tdap) 07/11/2025   Pneumonia Vaccine 38+ Years old  Completed   INFLUENZA VACCINE  Completed   DEXA SCAN  Completed   Zoster Vaccines- Shingrix  Completed   HPV VACCINES  Aged Out    Allergies  Allergen Reactions   Amitid [Amitriptyline Hcl]     Patient experienced a dizzy, spinning feeling    Metronidazole     REACTION: throat swells   Penicillins     REACTION: rash & fever   Alendronate Sodium     REACTION: unspecified   Amoxicillin Nausea And Vomiting   Clarithromycin     GI intolerance   Flonase [Fluticasone Propionate]     Slight Headache    Promethazine Hcl     REACTION: unspecified   Robitussin (Alcohol Free) [Guaifenesin] Other (See Comments)    unknown hyperactive   Spironolactone     09/21/13 weakness in legs   Singulair [Montelukast Sodium]     "spacey"; ineffective    Outpatient Encounter Medications as of 03/29/2024  Medication Sig   acetaminophen (TYLENOL) 325 MG tablet Take 650 mg by mouth in the morning and at bedtime. Not to exceed 3000 mg per 24 hours.   acetaminophen (TYLENOL) 500 MG tablet Take 500 mg by mouth every 8 (eight) hours as needed for moderate pain.   amLODipine (NORVASC) 10 MG tablet Take 1 tablet (10 mg total) by mouth daily.   aspirin EC 81 MG tablet Take 81 mg by mouth daily. Swallow whole.    Cholecalciferol (VITAMIN D) 50 MCG (2000 UT) tablet Take 2,000 Units by mouth daily.   docusate sodium (COLACE) 100 MG capsule Take 1 capsule (100 mg total) by mouth 2 (two) times daily as needed (for constipation).   fexofenadine (ALLEGRA) 180 MG tablet Take 180 mg by mouth daily as needed for allergies or rhinitis.    furosemide (LASIX) 40 MG tablet Take 1 tablet (40 mg total) by mouth daily.   lidocaine (LIDODERM) 5 % Place 1 patch onto the skin daily. Remove & Discard patch within 12 hours or  as directed by MD   Menthol, Topical Analgesic, (BIOFREEZE) 4 % GEL Apply 1 application topically 2 (two) times daily as needed (left knee pain).    metoprolol succinate (TOPROL-XL) 25 MG 24 hr tablet TAKE 1.5 TABLETS (37.5 MG TOTAL) BY MOUTH DAILY.   Multiple Vitamins-Minerals (MULTIVITAMIN ADULT) CHEW Chew 400 mg by mouth daily.   omeprazole (PRILOSEC) 20 MG capsule Take 20 mg by mouth daily.   polyethylene glycol (MIRALAX / GLYCOLAX) 17 g packet Take 17 g by mouth daily.   potassium chloride SA (KLOR-CON) 20 MEQ tablet Take 20 mEq by mouth daily.   No facility-administered encounter medications on file as of 03/29/2024.    Review of Systems  Constitutional:  Negative for chills and fever.  HENT:  Negative for sinus pressure and sore throat.   Respiratory:  Negative for cough, shortness of breath and wheezing.   Cardiovascular:  Negative for chest pain, palpitations and leg swelling.  Gastrointestinal:  Negative for abdominal distention, abdominal pain, blood in stool, constipation, diarrhea, nausea and vomiting.  Genitourinary:  Negative for dysuria, frequency and urgency.  Musculoskeletal:  Positive for arthralgias.  Neurological:  Negative for dizziness, weakness and numbness.  Psychiatric/Behavioral:  Negative for confusion.    Negative unless indicated in HPI.  There were no vitals filed for this visit. There is no height or weight on file to calculate BMI. BP Readings from Last 3  Encounters:  12/16/23 136/64  10/28/23 138/67  09/29/23 134/68   Wt Readings from Last 3 Encounters:  12/16/23 128 lb 6.4 oz (58.2 kg)  10/28/23 127 lb 9.6 oz (57.9 kg)  09/29/23 132 lb 3.2 oz (60 kg)   Physical Exam Constitutional:      Appearance: Normal appearance.  HENT:     Head: Normocephalic and atraumatic.  Cardiovascular:     Rate and Rhythm: Normal rate and regular rhythm.  Pulmonary:     Effort: Pulmonary effort is normal. No respiratory distress.     Breath sounds: Normal breath sounds. No wheezing.  Abdominal:     General: Bowel sounds are normal. There is no distension.     Tenderness: There is no abdominal tenderness. There is no guarding or rebound.     Comments:    Musculoskeletal:        General: No swelling or tenderness.  Neurological:     Mental Status: She is alert. Mental status is at baseline.     Motor: No weakness.     Labs reviewed: Basic Metabolic Panel: No results for input(s): "NA", "K", "CL", "CO2", "GLUCOSE", "BUN", "CREATININE", "CALCIUM", "MG", "PHOS" in the last 8760 hours. Liver Function Tests: No results for input(s): "AST", "ALT", "ALKPHOS", "BILITOT", "PROT", "ALBUMIN" in the last 8760 hours. No results for input(s): "LIPASE", "AMYLASE" in the last 8760 hours. No results for input(s): "AMMONIA" in the last 8760 hours. CBC: No results for input(s): "WBC", "NEUTROABS", "HGB", "HCT", "MCV", "PLT" in the last 8760 hours. Cardiac Enzymes: No results for input(s): "CKTOTAL", "CKMB", "CKMBINDEX", "TROPONINI" in the last 8760 hours. BNP: Invalid input(s): "POCBNP" Lab Results  Component Value Date   HGBA1C 5.5 07/12/2015   Lab Results  Component Value Date   TSH 1.53 08/22/2022   Lab Results  Component Value Date   VITAMINB12 562 08/22/2022   Lab Results  Component Value Date   FOLATE >20.0 ng/mL 07/27/2010   Lab Results  Component Value Date   IRON 81 07/27/2010   FERRITIN 50.5 04/17/2010    Imaging and Procedures  obtained prior to SNF admission: CT Head Wo Contrast Result Date: 04/08/2021 CLINICAL DATA:  Head trauma, moderate to severe. EXAM: CT HEAD WITHOUT CONTRAST TECHNIQUE: Contiguous axial images were obtained from the base of the skull through the vertex without intravenous contrast. COMPARISON:  Maxillofacial CT April 27, 2013 FINDINGS: Brain: No evidence of acute infarction, hemorrhage, hydrocephalus, extra-axial collection or mass lesion/mass effect. Moderate brain parenchymal volume loss and deep white matter microangiopathy. Vascular: No hyperdense vessel or unexpected calcification. Skull: Normal. Negative for fracture or focal lesion. Sinuses/Orbits: No acute finding. Other: Large right occipital scalp hematoma. IMPRESSION: 1. No acute intracranial abnormality. 2. Moderate brain parenchymal atrophy and chronic microvascular disease. 3. Large right occipital scalp hematoma. Electronically Signed   By: Ted Mcalpine M.D.   On: 04/08/2021 16:07    Assessment and Plan               Family/ staff Communication:   Labs/tests ordered:  Venita Sheffield

## 2024-03-31 ENCOUNTER — Encounter: Payer: Self-pay | Admitting: Sports Medicine

## 2024-04-27 DIAGNOSIS — M2012 Hallux valgus (acquired), left foot: Secondary | ICD-10-CM | POA: Diagnosis not present

## 2024-04-27 DIAGNOSIS — L602 Onychogryphosis: Secondary | ICD-10-CM | POA: Diagnosis not present

## 2024-04-27 DIAGNOSIS — M2011 Hallux valgus (acquired), right foot: Secondary | ICD-10-CM | POA: Diagnosis not present

## 2024-06-22 ENCOUNTER — Non-Acute Institutional Stay: Payer: Self-pay | Admitting: Nurse Practitioner

## 2024-06-22 ENCOUNTER — Encounter: Payer: Self-pay | Admitting: Nurse Practitioner

## 2024-06-22 DIAGNOSIS — E785 Hyperlipidemia, unspecified: Secondary | ICD-10-CM | POA: Diagnosis not present

## 2024-06-22 DIAGNOSIS — K219 Gastro-esophageal reflux disease without esophagitis: Secondary | ICD-10-CM | POA: Diagnosis not present

## 2024-06-22 DIAGNOSIS — I872 Venous insufficiency (chronic) (peripheral): Secondary | ICD-10-CM

## 2024-06-22 DIAGNOSIS — I10 Essential (primary) hypertension: Secondary | ICD-10-CM | POA: Diagnosis not present

## 2024-06-22 DIAGNOSIS — F419 Anxiety disorder, unspecified: Secondary | ICD-10-CM | POA: Diagnosis not present

## 2024-06-22 DIAGNOSIS — E559 Vitamin D deficiency, unspecified: Secondary | ICD-10-CM

## 2024-06-22 DIAGNOSIS — F32A Depression, unspecified: Secondary | ICD-10-CM | POA: Diagnosis not present

## 2024-06-22 DIAGNOSIS — M150 Primary generalized (osteo)arthritis: Secondary | ICD-10-CM | POA: Diagnosis not present

## 2024-06-22 DIAGNOSIS — K5901 Slow transit constipation: Secondary | ICD-10-CM

## 2024-06-22 DIAGNOSIS — G3184 Mild cognitive impairment, so stated: Secondary | ICD-10-CM | POA: Diagnosis not present

## 2024-06-22 NOTE — Assessment & Plan Note (Signed)
 Vit D level 23 01/03/23, started Vit D 2000u qd.  Updated vitamin D  level

## 2024-06-22 NOTE — Assessment & Plan Note (Signed)
 stable, on Omeprazole.  Update CBC/diff

## 2024-06-22 NOTE — Assessment & Plan Note (Signed)
functioning well [facility], TSH 1.53, Vit B12 562 08/22/22 

## 2024-06-22 NOTE — Assessment & Plan Note (Signed)
 blood pressure is controlled, on amlodipine  Metoprolol , Bun/creat 13/0.5 01/03/23, update CMP EGFR

## 2024-06-22 NOTE — Assessment & Plan Note (Signed)
 not on statin, update lipid panel and hemoglobin A1c

## 2024-06-22 NOTE — Assessment & Plan Note (Signed)
Stable, takes MiraLax  

## 2024-06-22 NOTE — Progress Notes (Unsigned)
 Location:   [facility]  Nursing Home Room Number: 905-A Place of Service:  ALF [phone]) Provider:  Chenay Nesmith, NP  PCP: Charlanne Fredia CROME, MD  Patient Care Team: Charlanne Fredia CROME, MD as PCP - General (Internal Medicine) Yvone Rush, MD as Consulting Physician (Orthopedic Surgery)  Extended Emergency Contact Information Primary Emergency Contact: Eva SPEAK Address: [address]          [address]          [place], [place] [address] [place] Mobile Phone: [phone] Relation: Relative Secondary Emergency Contact: Kee Rush Address: [address]          [place], [place] [address] [place] Home Phone: [phone] Mobile Phone: [phone] Relation: Relative  Code Status: DNR  Goals of care: Advanced Directive information    06/22/2024    8:44 AM  Advanced Directives  Does Patient Have a Medical Advance Directive? Yes  Type of Estate agent of [place];Living will;Out of facility DNR (pink MOST or yellow form)  Does patient want to make changes to medical advance directive? No - Patient declined  Copy of Healthcare Power of Attorney in Chart? Yes - validated most recent copy scanned in chart (See row information)     Chief Complaint  Patient presents with  . Medical Management of Chronic Issues    Routine Visit. Discuss the need for Covid Booster.    HPI:  Pt is a 89 y.o. female seen today for medical management of chronic diseases.     Anemia, Hgb 12.4 01/03/23             OA, S/p left bipolar hemiarthroplasty 10/12/20 for left hip fracture. Chronic left leg/knee pain, 07/02/23 X-ray left knee severe OA takes Tylenol .              Vit D deficiency, Vit D level 23 01/03/23, started Vit D 2000u qd.   HLD, not on statin             HTN, blood pressure is controlled, on amlodipine  Metoprolol , Bun/creat 13/0.5 01/03/23             CHF/PVD, peripheral edema is mild left foot, on Furosemide . Cardiology f/u              GERD, stable, on Omeprazole.              Depression/anxiety, lifelong issue, but sleeps/eats at her baseline. TSH 1.53 08/22/22             Constipation, takes MiraLax               Cognitive impairment, functioning well [facility], TSH 1.53, Vit B12 562 08/22/22    Past Medical History:  Diagnosis Date  . Aortic heart murmur    AS/AI on 2D ECHO ; SBE prophylaxis Rxed  . Chest pain 2007 ; 2011   negative cardiac assessment  . GERD (gastroesophageal reflux disease)   . Hyperlipidemia   . Hypertension [  . Interstitial cystitis    Resolved  . Ocular migraine 04/10/2012  . Osteoporosis    T score - 3.0 @ femoral neck; Fosamax affected swallowing  . Peripheral vascular disease (HCC)   . Postmenopausal    No PMH or HRT   Past Surgical History:  Procedure Laterality Date  . ANTERIOR APPROACH HEMI HIP ARTHROPLASTY Right 09/03/2019   Procedure: ANTERIOR APPROACH HEMI HIP ARTHROPLASTY BIPOLAR;  Surgeon: Yvone Rush, MD;  Location: [facility];  Service:  Orthopedics;  Laterality: Right;  . COLONOSCOPY  2011   Dr Jakie  . HIP ARTHROPLASTY Left 10/11/2020   Procedure: ARTHROPLASTY BIPOLAR HIP (HEMIARTHROPLASTY) ANTERIOR APPROACH;  Surgeon: Yvone Rush, MD;  Location: [facility];  Service: Orthopedics;  Laterality: Left;    Allergies  Allergen Reactions  . Amitid  [Amitriptyline  Hcl]     Patient experienced a dizzy, spinning feeling   . Metronidazole     REACTION: throat swells  . Penicillins     REACTION: rash & fever  . Alendronate Sodium     REACTION: unspecified  . Amoxicillin Nausea And Vomiting  . Clarithromycin     GI intolerance  . Flonase  [Fluticasone  Propionate]     Slight Headache   . Promethazine Hcl     REACTION: unspecified  . Robitussin (Alcohol Free) [Guaifenesin] Other (See Comments)    unknown hyperactive  . Spironolactone      09/21/13 weakness in legs  . Singulair  [Montelukast  Sodium]     spacey; ineffective    Allergies as of 06/22/2024       Reactions    Amitid  [amitriptyline  Hcl]    Patient experienced a dizzy, spinning feeling    Metronidazole    REACTION: throat swells   Penicillins    REACTION: rash & fever   Alendronate Sodium    REACTION: unspecified   Amoxicillin Nausea And Vomiting   Clarithromycin    GI intolerance   Flonase  [fluticasone  Propionate]    Slight Headache    Promethazine Hcl    REACTION: unspecified   Robitussin (alcohol Free) [guaifenesin] Other (See Comments)   unknown hyperactive   Spironolactone     09/21/13 weakness in legs   Singulair  [montelukast  Sodium]    spacey; ineffective        Medication List        Accurate as of June 22, 2024 12:27 PM. If you have any questions, ask your nurse or doctor.          STOP taking these medications    metoprolol  succinate 25 MG 24 hr tablet Commonly known as: TOPROL -XL Stopped by: Katrina Daddona X Dustine Stickler       TAKE these medications    acetaminophen  500 MG tablet Commonly known as: TYLENOL  Take 500 mg by mouth every 8 (eight) hours as needed for moderate pain.   acetaminophen  325 MG tablet Commonly known as: TYLENOL  Take 650 mg by mouth in the morning and at bedtime. Not to exceed 3000 mg per 24 hours.   amLODipine  10 MG tablet Commonly known as: NORVASC  Take 1 tablet (10 mg total) by mouth daily.   aspirin  EC 81 MG tablet Take 81 mg by mouth daily. Swallow whole.   Biofreeze 4 % Gel Generic drug: Menthol (Topical Analgesic) Apply 1 application topically 2 (two) times daily as needed (left knee pain).   docusate sodium  100 MG capsule Commonly known as: COLACE Take 1 capsule (100 mg total) by mouth 2 (two) times daily as needed (for constipation).   fexofenadine 180 MG tablet Commonly known as: ALLEGRA Take 180 mg by mouth daily as needed for allergies or rhinitis.   furosemide  40 MG tablet Commonly known as: LASIX  Take 1 tablet (40 mg total) by mouth daily.   lidocaine  5 % Commonly known as: LIDODERM  Place 1 patch onto the skin daily.  Remove & Discard patch within 12 hours or as directed by MD   lidocaine  5 % Commonly known as: LIDODERM  Place 1 patch onto the skin every 12 (twelve) hours as needed.  Remove & Discard patch within 12 hours or as directed by MD   metoprolol  tartrate 25 MG tablet Commonly known as: LOPRESSOR  Take 25 mg by mouth daily.   Multivitamin Adult Chew Chew 400 mg by mouth daily.   omeprazole 20 MG capsule Commonly known as: PRILOSEC Take 20 mg by mouth daily.   polyethylene glycol 17 g packet Commonly known as: MIRALAX  / GLYCOLAX  Take 17 g by mouth as needed.   potassium chloride  SA 20 MEQ tablet Commonly known as: KLOR-CON  M Take 20 mEq by mouth daily.   Vitamin D  50 MCG (2000 UT) tablet Take 2,000 Units by mouth daily.        Review of Systems  Constitutional:  Negative for appetite change, fatigue and fever.  HENT:  Positive for hearing loss. Negative for congestion and trouble swallowing.   Eyes:  Negative for visual disturbance.  Respiratory:  Negative for cough and wheezing.   Cardiovascular:  Positive for leg swelling.  Gastrointestinal:  Negative for abdominal pain and constipation.       GERD  Genitourinary:  Positive for frequency. Negative for dysuria and urgency.  Musculoskeletal:  Positive for arthralgias and gait problem.       Knee pain, mostly in the left, ambulates with walker  Skin:  Negative for color change.  Neurological:  Negative for speech difficulty, weakness and light-headedness.       Memory lapses.   Psychiatric/Behavioral:  Negative for behavioral problems and sleep disturbance. The patient is not nervous/anxious.     Immunization History  Administered Date(s) Administered  . Fluad Quad(high Dose 65+) 10/05/2019, 10/21/2022  . Influenza Split 12/10/2011, 09/29/2012  . Influenza, High Dose Seasonal PF 09/21/2014, 08/27/2016, 08/27/2017, 09/23/2018  . Influenza,inj,Quad PF,6+ Mos 10/08/2013, 09/12/2015  . Influenza-Unspecified 10/27/2020,  10/18/2021, 10/01/2023  . Moderna Covid-19 Vaccine  Bivalent Booster 24yrs & up 10/15/2023  . Moderna Sars-Covid-2 Vaccination 11/07/2020, 05/16/2022  . PFIZER(Purple Top)SARS-COV-2 Vaccination 02/28/2020, 03/29/2020, 11/07/2020, 05/29/2021, 09/18/2021  . Pfizer Covid-19 Vaccine Bivalent Booster 32yrs & up 10/31/2022  . Pneumococcal Conjugate-13 08/22/2015  . Pneumococcal Polysaccharide-23 11/23/2008  . Td 03/23/2004  . Tdap 07/12/2015  . Zoster Recombinant(Shingrix) 01/01/2022, 03/26/2022   Pertinent  Health Maintenance Due  Topic Date Due  . INFLUENZA VACCINE  07/30/2024  . DEXA SCAN  Completed      10/13/2020    1:00 AM 10/13/2020    8:15 AM 04/08/2021    2:44 PM 01/07/2023    9:34 AM 06/18/2023    9:16 AM  Fall Risk  Falls in the past year?    0 1  Was there an injury with Fall?    0 0  Fall Risk Category Calculator    0 1  Fall Risk Category (Retired)    Low    (RETIRED) Patient Fall Risk Level High fall risk  High fall risk  High fall risk  Low fall risk    Patient at Risk for Falls Due to    No Fall Risks History of fall(s);Impaired balance/gait;Impaired mobility;Impaired vision  Fall risk Follow up    Falls evaluation completed  Falls evaluation completed  Fall risk Follow up - Comments     High Fall Risk     Data saved with a previous flowsheet row definition   Functional Status Survey:    Vitals:   06/22/24 0839  BP: 133/67  Pulse: 75  Resp: 19  Temp: (!) 96.2 F (35.7 C)  SpO2: 96%  Weight: 128 lb (58.1 kg)  Height:  5' 4 (1.626 m)   Body mass index is 21.97 kg/m. Physical Exam Vitals and nursing note reviewed.  Constitutional:      Appearance: Normal appearance.  HENT:     Head: Normocephalic and atraumatic.     Nose: Nose normal.     Mouth/Throat:     Mouth: Mucous membranes are moist.   Eyes:     Extraocular Movements: Extraocular movements intact.     Conjunctiva/sclera: Conjunctivae normal.     Pupils: Pupils are equal, round, and reactive to  light.    Cardiovascular:     Rate and Rhythm: Normal rate.     Heart sounds: No murmur heard. Pulmonary:     Effort: Pulmonary effort is normal.     Breath sounds: No rales.  Abdominal:     General: Bowel sounds are normal.     Palpations: Abdomen is soft.     Tenderness: There is no abdominal tenderness.   Musculoskeletal:     Cervical back: Normal range of motion and neck supple.     Right lower leg: No edema.     Left lower leg: Edema present.     Comments: Trace edema LLE Medial left knee is enlarged, slightly warmer than the right, no redness, ballottement, or reduced ROM.   Skin:    General: Skin is warm and dry.   Neurological:     General: No focal deficit present.     Mental Status: She is alert and oriented to person, place, and time. Mental status is at baseline.     Gait: Gait abnormal.   Psychiatric:        Mood and Affect: Mood normal.        Behavior: Behavior normal.        Thought Content: Thought content normal.        Judgment: Judgment normal.     Comments: Admitted feeling depressed or sad at times, but declined antidepressant, stated she sleeps well, has supportive family, is happy at [facility].     Labs reviewed: No results for input(s): NA, K, CL, CO2, GLUCOSE, BUN, CREATININE, CALCIUM, MG, PHOS in the last 8760 hours. No results for input(s): AST, ALT, ALKPHOS, BILITOT, PROT, ALBUMIN in the last 8760 hours. No results for input(s): WBC, NEUTROABS, HGB, HCT, MCV, PLT in the last 8760 hours. Lab Results  Component Value Date   TSH 1.53 08/22/2022   Lab Results  Component Value Date   HGBA1C 5.5 07/12/2015   Lab Results  Component Value Date   CHOL 211 (A) 08/22/2022   HDL 27 (A) 08/22/2022   LDLCALC 114 08/22/2022   LDLDIRECT 114.7 06/24/2013   TRIG 110 08/22/2022   CHOLHDL 3 06/24/2013    Significant Diagnostic Results in last 30 days:  No results found.  Assessment/Plan Essential  hypertension blood pressure is controlled, on amlodipine  Metoprolol , Bun/creat 13/0.5 01/03/23, update CMP EGFR  Venous (peripheral) insufficiency peripheral edema is mild left foot, on Furosemide . Cardiology f/u, compensated clinically.  Esophageal reflux stable, on Omeprazole.  Update CBC/diff  Anxiety and depression  lifelong issue, but sleeps/eats at her baseline. TSH 1.53 08/22/22, update TSH and vitamin B12  Slow transit constipation Stable, takes MiraLax    Mild cognitive impairment functioning well [facility], TSH 1.53, Vit B12 562 08/22/22    HLD (hyperlipidemia) not on statin, update lipid panel and hemoglobin A1c  Vitamin D  deficiency  Vit D level 23 01/03/23, started Vit D 2000u qd.  Updated vitamin D  level  Osteoarthritis,  multiple sites S/p left bipolar hemiarthroplasty 10/12/20 for left hip fracture. Chronic left leg/knee pain, 07/02/23 X-ray left knee severe OA takes Tylenol .  Ambulates with a walker     Family/ staff Communication: Plan of care reviewed with the patient and charge nurse  Labs/tests ordered: TSH, CBC/diff, CMP with EGFR, lipid panel, vitamin D , vitamin B12, and hemoglobin A1c

## 2024-06-22 NOTE — Assessment & Plan Note (Signed)
 peripheral edema is mild left foot, on Furosemide . Cardiology f/u, compensated clinically.

## 2024-06-22 NOTE — Assessment & Plan Note (Signed)
 S/p left bipolar hemiarthroplasty 10/12/20 for left hip fracture. Chronic left leg/knee pain, 07/02/23 X-ray left knee severe OA takes Tylenol .  Ambulates with a walker

## 2024-06-22 NOTE — Assessment & Plan Note (Signed)
 lifelong issue, but sleeps/eats at her baseline. TSH 1.53 08/22/22, update TSH and vitamin B12

## 2024-06-24 DIAGNOSIS — Z131 Encounter for screening for diabetes mellitus: Secondary | ICD-10-CM | POA: Diagnosis not present

## 2024-06-24 DIAGNOSIS — N183 Chronic kidney disease, stage 3 unspecified: Secondary | ICD-10-CM | POA: Diagnosis not present

## 2024-06-24 DIAGNOSIS — E785 Hyperlipidemia, unspecified: Secondary | ICD-10-CM | POA: Diagnosis not present

## 2024-06-24 DIAGNOSIS — E559 Vitamin D deficiency, unspecified: Secondary | ICD-10-CM | POA: Diagnosis not present

## 2024-06-24 DIAGNOSIS — I10 Essential (primary) hypertension: Secondary | ICD-10-CM | POA: Diagnosis not present

## 2024-06-24 DIAGNOSIS — M1712 Unilateral primary osteoarthritis, left knee: Secondary | ICD-10-CM | POA: Diagnosis not present

## 2024-06-24 DIAGNOSIS — Z1329 Encounter for screening for other suspected endocrine disorder: Secondary | ICD-10-CM | POA: Diagnosis not present

## 2024-06-25 ENCOUNTER — Encounter: Payer: Self-pay | Admitting: Nurse Practitioner

## 2024-06-25 DIAGNOSIS — R7303 Prediabetes: Secondary | ICD-10-CM | POA: Insufficient documentation

## 2024-09-20 ENCOUNTER — Non-Acute Institutional Stay: Payer: MEDICARE | Admitting: Sports Medicine

## 2024-09-20 DIAGNOSIS — I10 Essential (primary) hypertension: Secondary | ICD-10-CM | POA: Diagnosis not present

## 2024-09-20 DIAGNOSIS — M159 Polyosteoarthritis, unspecified: Secondary | ICD-10-CM

## 2024-09-20 DIAGNOSIS — E559 Vitamin D deficiency, unspecified: Secondary | ICD-10-CM

## 2024-09-20 DIAGNOSIS — R4189 Other symptoms and signs involving cognitive functions and awareness: Secondary | ICD-10-CM | POA: Diagnosis not present

## 2024-09-20 DIAGNOSIS — I509 Heart failure, unspecified: Secondary | ICD-10-CM | POA: Diagnosis not present

## 2024-09-20 DIAGNOSIS — K219 Gastro-esophageal reflux disease without esophagitis: Secondary | ICD-10-CM

## 2024-09-20 NOTE — Progress Notes (Unsigned)
 Provider:  Dr. Jackalyn Blazing Location:  [facility] Place of Service:   Assisted living   PCP: Charlanne Fredia CROME, MD Patient Care Team: Charlanne Fredia CROME, MD as PCP - General (Internal Medicine) Yvone Rush, MD as Consulting Physician (Orthopedic Surgery)  Extended Emergency Contact Information Primary Emergency Contact: Eva SPEAK Address: [address]          [address]          [place], [place] [address] [place] Mobile Phone: [phone] Relation: Relative Secondary Emergency Contact: Kee Rush Address: [address]          [place], [place] [address] [place] Home Phone: [phone] Mobile Phone: [phone] Relation: Relative  Goals of Care: Advanced Directive information    06/22/2024    8:44 AM  Advanced Directives  Does Patient Have a Medical Advance Directive? Yes  Type of Estate agent of [place];Living will;Out of facility DNR (pink MOST or yellow form)  Does patient want to make changes to medical advance directive? No - Patient declined  Copy of Healthcare Power of Attorney in Chart? Yes - validated most recent copy scanned in chart (See row information)         History of Present Illness  89 yr old F with h/o anemia, OA, HTN, HLD, VIT D deficiency, CHF , depression, cognitive impairment is evaluated for chronic disease management    Pt seen and examined in her room  She is sitting comfortably in her chair watching TV  Pt denies chest pain, palpitations, SOB, abdominal pain, nausea, vomiting, dysuria, hematuria, bloody or dark stools Ambulates with a walker Has no concerns today  Denies being depressed or anxious Reports good appetite    Latest Ref Rng & Units 01/03/2023   12:00 AM 08/22/2022   12:00 AM 09/25/2021   12:00 AM  CBC  WBC  6.1     6.4     5.9      Hemoglobin 12.0 - 16.0 12.4     12.9     12.4      Hematocrit 36 - 46 37     38     37      Platelets 150 - 400 K/uL  295     259     263         This result is from an external source.       Latest Ref Rng & Units 01/03/2023   12:00 AM 08/22/2022   12:00 AM 05/06/2022   11:37 AM  BMP  Glucose 70 - 99 mg/dL   92   BUN 4 - 21 13     14     14    Creatinine 0.5 - 1.1 0.5     0.6     0.61   BUN/Creat Ratio 12 - 28   23   Sodium 137 - 147 139     137     138   Potassium 3.5 - 5.1 mEq/L 4.5     4.3     4.2   Chloride 99 - 108 103     102     99   CO2 13 - 22 30     29     23    Calcium 8.7 - 10.7 9.6     9.5     9.7      This result is from an external source.  Past Medical History:  Diagnosis Date   Aortic heart murmur    AS/AI on 2D ECHO ; SBE prophylaxis Rxed   Chest pain 2007 ; 2011   negative cardiac assessment   GERD (gastroesophageal reflux disease)    Hyperlipidemia    Hypertension [   Interstitial cystitis    Resolved   Ocular migraine 04/10/2012   Osteoporosis    T score - 3.0 @ femoral neck; Fosamax affected swallowing   Peripheral vascular disease    Postmenopausal    No PMH or HRT   Past Surgical History:  Procedure Laterality Date   ANTERIOR APPROACH HEMI HIP ARTHROPLASTY Right 09/03/2019   Procedure: ANTERIOR APPROACH HEMI HIP ARTHROPLASTY BIPOLAR;  Surgeon: Yvone Rush, MD;  Location: [facility];  Service: Orthopedics;  Laterality: Right;   COLONOSCOPY  2011   Dr Jakie   HIP ARTHROPLASTY Left 10/11/2020   Procedure: ARTHROPLASTY BIPOLAR HIP (HEMIARTHROPLASTY) ANTERIOR APPROACH;  Surgeon: Yvone Rush, MD;  Location: [facility];  Service: Orthopedics;  Laterality: Left;    reports that she has never smoked. She has never used smokeless tobacco. She reports that she does not drink alcohol and does not use drugs. Social History   Socioeconomic History   Marital status: Widowed    Spouse name: Not on file   Number of children: 1   Years of education: Not on file   Highest education level: Not on file  Occupational History   Not on file  Tobacco Use   Smoking  status: Never   Smokeless tobacco: Never  Substance and Sexual Activity   Alcohol use: No   Drug use: No   Sexual activity: Not Currently    Birth control/protection: None  Other Topics Concern   Not on file  Social History Narrative   Not on file   Social Drivers of Health   Financial Resource Strain: Not on file  Food Insecurity: Not on file  Transportation Needs: Not on file  Physical Activity: Not on file  Stress: Not on file  Social Connections: Not on file  Intimate Partner Violence: Not on file    Functional Status Survey:    Family History  Problem Relation Age of Onset   Diabetes Father        Pancreatitic insufficiency post MVA   Hypertension Mother    Osteoporosis Mother    Heart disease Mother        AF   Thyroid  disease Sister        hypothyroidism   Coronary artery disease Sister        AF   Stroke Sister 45       also pulmonary disease    Health Maintenance  Topic Date Due   Influenza Vaccine  07/30/2024   COVID-19 Vaccine (10 - 2025-26 season) 08/30/2024   DTaP/Tdap/Td (3 - Td or Tdap) 07/11/2025   Pneumococcal Vaccine: 50+ Years  Completed   DEXA SCAN  Completed   Zoster Vaccines- Shingrix  Completed   HPV VACCINES  Aged Out   Meningococcal B Vaccine  Aged Out    Allergies  Allergen Reactions   Amitid  [Amitriptyline  Hcl]     Patient experienced a dizzy, spinning feeling    Metronidazole     REACTION: throat swells   Penicillins     REACTION: rash & fever   Alendronate Sodium     REACTION: unspecified   Amoxicillin Nausea And Vomiting   Clarithromycin     GI intolerance   Flonase  [Fluticasone  Propionate]  Slight Headache    Promethazine Hcl     REACTION: unspecified   Robitussin (Alcohol Free) [Guaifenesin] Other (See Comments)    unknown hyperactive   Spironolactone      09/21/13 weakness in legs   Singulair  [Montelukast  Sodium]     spacey; ineffective    Outpatient Encounter Medications as of 09/20/2024  Medication  Sig   acetaminophen  (TYLENOL ) 325 MG tablet Take 650 mg by mouth in the morning and at bedtime. Not to exceed 3000 mg per 24 hours.   acetaminophen  (TYLENOL ) 500 MG tablet Take 500 mg by mouth every 8 (eight) hours as needed for moderate pain.   amLODipine  (NORVASC ) 10 MG tablet Take 1 tablet (10 mg total) by mouth daily.   aspirin  EC 81 MG tablet Take 81 mg by mouth daily. Swallow whole.   Cholecalciferol (VITAMIN D ) 50 MCG (2000 UT) tablet Take 2,000 Units by mouth daily.   docusate sodium  (COLACE) 100 MG capsule Take 1 capsule (100 mg total) by mouth 2 (two) times daily as needed (for constipation).   fexofenadine (ALLEGRA) 180 MG tablet Take 180 mg by mouth daily as needed for allergies or rhinitis.    furosemide  (LASIX ) 40 MG tablet Take 1 tablet (40 mg total) by mouth daily.   lidocaine  (LIDODERM ) 5 % Place 1 patch onto the skin daily. Remove & Discard patch within 12 hours or as directed by MD   lidocaine  (LIDODERM ) 5 % Place 1 patch onto the skin every 12 (twelve) hours as needed. Remove & Discard patch within 12 hours or as directed by MD   Menthol, Topical Analgesic, (BIOFREEZE) 4 % GEL Apply 1 application topically 2 (two) times daily as needed (left knee pain).    metoprolol  tartrate (LOPRESSOR ) 25 MG tablet Take 25 mg by mouth daily.   Multiple Vitamins-Minerals (MULTIVITAMIN ADULT) CHEW Chew 400 mg by mouth daily.   omeprazole (PRILOSEC) 20 MG capsule Take 20 mg by mouth daily.   polyethylene glycol (MIRALAX  / GLYCOLAX ) 17 g packet Take 17 g by mouth as needed.   potassium chloride  SA (KLOR-CON ) 20 MEQ tablet Take 20 mEq by mouth daily.   No facility-administered encounter medications on file as of 09/20/2024.    Review of Systems  Constitutional:  Negative for chills and fever.  HENT:  Negative for sinus pressure and sore throat.   Respiratory:  Negative for cough, shortness of breath and wheezing.   Cardiovascular:  Negative for chest pain, palpitations and leg swelling.   Gastrointestinal:  Negative for abdominal pain, blood in stool, constipation, diarrhea, nausea and vomiting.  Genitourinary:  Negative for dysuria, frequency and urgency.  Neurological:  Negative for dizziness.  Psychiatric/Behavioral:  Negative for confusion.    Negative unless indicated in HPI.  There were no vitals filed for this visit. There is no height or weight on file to calculate BMI. BP Readings from Last 3 Encounters:  06/22/24 133/67  03/29/24 127/71  12/16/23 136/64   Wt Readings from Last 3 Encounters:  06/22/24 128 lb (58.1 kg)  03/29/24 127 lb 12.8 oz (58 kg)  12/16/23 128 lb 6.4 oz (58.2 kg)   Physical Exam Constitutional:      Appearance: Normal appearance.  HENT:     Head: Normocephalic and atraumatic.  Cardiovascular:     Rate and Rhythm: Normal rate and regular rhythm.  Pulmonary:     Effort: Pulmonary effort is normal. No respiratory distress.     Breath sounds: Normal breath sounds. No wheezing.  Abdominal:  General: Bowel sounds are normal. There is no distension.     Tenderness: There is no abdominal tenderness. There is no guarding or rebound.     Comments:    Musculoskeletal:        General: No swelling.  Neurological:     Mental Status: She is alert. Mental status is at baseline.     Motor: No weakness.     Labs reviewed: Basic Metabolic Panel: No results for input(s): NA, K, CL, CO2, GLUCOSE, BUN, CREATININE, CALCIUM, MG, PHOS in the last 8760 hours. Liver Function Tests: No results for input(s): AST, ALT, ALKPHOS, BILITOT, PROT, ALBUMIN in the last 8760 hours. No results for input(s): LIPASE, AMYLASE in the last 8760 hours. No results for input(s): AMMONIA in the last 8760 hours. CBC: No results for input(s): WBC, NEUTROABS, HGB, HCT, MCV, PLT in the last 8760 hours. Cardiac Enzymes: No results for input(s): CKTOTAL, CKMB, CKMBINDEX, TROPONINI in the last 8760  hours. BNP: Invalid input(s): POCBNP Lab Results  Component Value Date   HGBA1C 5.5 07/12/2015   Lab Results  Component Value Date   TSH 1.53 08/22/2022   Lab Results  Component Value Date   VITAMINB12 562 08/22/2022   Lab Results  Component Value Date   FOLATE >20.0 ng/mL 07/27/2010   Lab Results  Component Value Date   IRON 81 07/27/2010   FERRITIN 50.5 04/17/2010    Imaging and Procedures obtained prior to SNF admission: CT Head Wo Contrast Result Date: 04/08/2021 CLINICAL DATA:  Head trauma, moderate to severe. EXAM: CT HEAD WITHOUT CONTRAST TECHNIQUE: Contiguous axial images were obtained from the base of the skull through the vertex without intravenous contrast. COMPARISON:  Maxillofacial CT April 27, 2013 FINDINGS: Brain: No evidence of acute infarction, hemorrhage, hydrocephalus, extra-axial collection or mass lesion/mass effect. Moderate brain parenchymal volume loss and deep white matter microangiopathy. Vascular: No hyperdense vessel or unexpected calcification. Skull: Normal. Negative for fracture or focal lesion. Sinuses/Orbits: No acute finding. Other: Large right occipital scalp hematoma. IMPRESSION: 1. No acute intracranial abnormality. 2. Moderate brain parenchymal atrophy and chronic microvascular disease. 3. Large right occipital scalp hematoma. Electronically Signed   By: Dobrinka  Dimitrova M.D.   On: 04/08/2021 16:07    Assessment and Plan Assessment & Plan  Cognitive impairment  No behavioral problems reported Increase cognitively engaging activities and physical activity    OA  Stable Cont with tylenol  prn   HTN  Cont with amlodipine   CHF Euvolemic on exam  Cont with lasix  Avoid salty foods  GERD Cont with omeprazole   Vit d Deficiency  Cont with vit D supplements

## 2024-09-24 ENCOUNTER — Encounter: Payer: Self-pay | Admitting: Sports Medicine

## 2024-10-25 DIAGNOSIS — Z23 Encounter for immunization: Secondary | ICD-10-CM | POA: Diagnosis not present

## 2024-12-13 ENCOUNTER — Encounter: Payer: Self-pay | Admitting: Nurse Practitioner

## 2024-12-13 ENCOUNTER — Non-Acute Institutional Stay: Payer: Self-pay | Admitting: Nurse Practitioner

## 2024-12-13 DIAGNOSIS — K219 Gastro-esophageal reflux disease without esophagitis: Secondary | ICD-10-CM | POA: Diagnosis not present

## 2024-12-13 DIAGNOSIS — D649 Anemia, unspecified: Secondary | ICD-10-CM | POA: Diagnosis not present

## 2024-12-13 DIAGNOSIS — I872 Venous insufficiency (chronic) (peripheral): Secondary | ICD-10-CM | POA: Diagnosis not present

## 2024-12-13 DIAGNOSIS — F419 Anxiety disorder, unspecified: Secondary | ICD-10-CM | POA: Diagnosis not present

## 2024-12-13 DIAGNOSIS — E785 Hyperlipidemia, unspecified: Secondary | ICD-10-CM

## 2024-12-13 DIAGNOSIS — K5901 Slow transit constipation: Secondary | ICD-10-CM

## 2024-12-13 DIAGNOSIS — G3184 Mild cognitive impairment, so stated: Secondary | ICD-10-CM

## 2024-12-13 DIAGNOSIS — E559 Vitamin D deficiency, unspecified: Secondary | ICD-10-CM

## 2024-12-13 DIAGNOSIS — M150 Primary generalized (osteo)arthritis: Secondary | ICD-10-CM

## 2024-12-13 DIAGNOSIS — I10 Essential (primary) hypertension: Secondary | ICD-10-CM

## 2024-12-13 DIAGNOSIS — F32A Depression, unspecified: Secondary | ICD-10-CM

## 2024-12-13 NOTE — Assessment & Plan Note (Signed)
 Stable  takes MiraLax 

## 2024-12-13 NOTE — Assessment & Plan Note (Signed)
 lifelong issue, but sleeps/eats at her baseline. TSH 1.53 08/22/22

## 2024-12-13 NOTE — Assessment & Plan Note (Signed)
 stable, on Omeprazole.

## 2024-12-13 NOTE — Assessment & Plan Note (Signed)
blood pressure is controlled, on amlodipine Metoprolol, Bun/creat 13/0.5 01/03/23 

## 2024-12-13 NOTE — Progress Notes (Signed)
 Location:   [facility] Room Number: 905 Place of Service:  AL[facility] (13) Provider: Larwance Bahja Bence NP  Charlanne Fredia CROME, MD  Patient Care Team: Charlanne Fredia CROME, MD as PCP - General (Internal Medicine) Yvone Rush, MD as Consulting Physician (Orthopedic Surgery)  Extended Emergency Contact Information Primary Emergency Contact: Eva SPEAK Address: [address]          [address]          [place], [place] [address] [place] Mobile Phone: [phone] Relation: Relative Secondary Emergency Contact: Kee Rush Address: [address]          [place], [place] [address] [place] Home Phone: [phone] Mobile Phone: [phone] Relation: Relative  Code Status:  DNR Goals of care: Advanced Directive information    06/22/2024    8:44 AM  Advanced Directives  Does Patient Have a Medical Advance Directive? Yes  Type of Estate Agent of [place];Living will;Out of facility DNR (pink MOST or yellow form)  Does patient want to make changes to medical advance directive? No - Patient declined  Copy of Healthcare Power of Attorney in Chart? Yes - validated most recent copy scanned in chart (See row information)     Chief Complaint  Patient presents with   Medical Management of Chronic Issues    HPI:  Pt is a 89 y.o. female seen today for medical management of chronic diseases.   Anemia, Hgb 12.4 01/03/23             OA, S/p left bipolar hemiarthroplasty 10/12/20 for left hip fracture. Chronic left leg/knee pain, 07/02/23 X-ray left knee severe OA takes Tylenol .              Vit D deficiency, Vit D level 23 01/03/23, started Vit D 2000u qd.              HLD, not on statin             HTN, blood pressure is controlled, on amlodipine  Metoprolol , Bun/creat 13/0.5 01/03/23             CHF/PVD, peripheral edema is mild left foot, on Furosemide . Cardiology f/u             GERD, stable, on Omeprazole.              Depression/anxiety,  lifelong issue, but sleeps/eats at her baseline. TSH 1.53 08/22/22             Constipation, takes MiraLax               Cognitive impairment, functioning well [facility], TSH 1.53, Vit B12 562 08/22/22  Past Medical History:  Diagnosis Date   Aortic heart murmur    AS/AI on 2D ECHO ; SBE prophylaxis Rxed   Chest pain 2007 ; 2011   negative cardiac assessment   GERD (gastroesophageal reflux disease)    Hyperlipidemia    Hypertension [   Interstitial cystitis    Resolved   Ocular migraine 04/10/2012   Osteoporosis    T score - 3.0 @ femoral neck; Fosamax affected swallowing   Peripheral vascular disease    Postmenopausal    No PMH or HRT   Past Surgical History:  Procedure Laterality Date   ANTERIOR APPROACH HEMI HIP ARTHROPLASTY Right 09/03/2019   Procedure: ANTERIOR APPROACH HEMI HIP ARTHROPLASTY BIPOLAR;  Surgeon: Yvone Rush, MD;  Location: [facility];  Service: Orthopedics;  Laterality: Right;   COLONOSCOPY  2011   Dr Jakie   HIP ARTHROPLASTY Left 10/11/2020   Procedure: ARTHROPLASTY BIPOLAR HIP (HEMIARTHROPLASTY) ANTERIOR APPROACH;  Surgeon: Yvone Rush, MD;  Location: [facility];  Service: Orthopedics;  Laterality: Left;    Allergies[1]  Allergies as of 12/13/2024       Reactions   Amitid  [amitriptyline  Hcl]    Patient experienced a dizzy, spinning feeling    Metronidazole    REACTION: throat swells   Penicillins    REACTION: rash & fever   Alendronate Sodium    REACTION: unspecified   Amoxicillin Nausea And Vomiting   Clarithromycin    GI intolerance   Flonase  [fluticasone  Propionate]    Slight Headache    Promethazine Hcl    REACTION: unspecified   Robitussin (alcohol Free) [guaifenesin] Other (See Comments)   unknown hyperactive   Spironolactone     09/21/13 weakness in legs   Singulair  [montelukast  Sodium]    spacey; ineffective        Medication List        Accurate as of December 13, 2024  4:49 PM. If you have any questions, ask your nurse or doctor.           acetaminophen  500 MG tablet Commonly known as: TYLENOL  Take 500 mg by mouth every 8 (eight) hours as needed for moderate pain.   acetaminophen  325 MG tablet Commonly known as: TYLENOL  Take 650 mg by mouth in the morning and at bedtime. Not to exceed 3000 mg per 24 hours.   amLODipine  10 MG tablet Commonly known as: NORVASC  Take 1 tablet (10 mg total) by mouth daily.   aspirin  EC 81 MG tablet Take 81 mg by mouth daily. Swallow whole.   Biofreeze 4 % Gel Generic drug: Menthol (Topical Analgesic) Apply 1 application topically 2 (two) times daily as needed (left knee pain).   docusate sodium  100 MG capsule Commonly known as: COLACE Take 1 capsule (100 mg total) by mouth 2 (two) times daily as needed (for constipation).   fexofenadine 180 MG tablet Commonly known as: ALLEGRA Take 180 mg by mouth daily as needed for allergies or rhinitis.   furosemide  40 MG tablet Commonly known as: LASIX  Take 1 tablet (40 mg total) by mouth daily.   lidocaine  5 % Commonly known as: LIDODERM  Place 1 patch onto the skin daily. Remove & Discard patch within 12 hours or as directed by MD   lidocaine  5 % Commonly known as: LIDODERM  Place 1 patch onto the skin every 12 (twelve) hours as needed. Remove & Discard patch within 12 hours or as directed by MD   metoprolol  tartrate 25 MG tablet Commonly known as: LOPRESSOR  Take 25 mg by mouth daily.   Multivitamin Adult Chew Chew 400 mg by mouth daily.   omeprazole 20 MG capsule Commonly known as: PRILOSEC Take 20 mg by mouth daily.   polyethylene glycol 17 g packet Commonly known as: MIRALAX  / GLYCOLAX  Take 17 g by mouth as needed.   potassium chloride  SA 20 MEQ tablet Commonly known as: KLOR-CON  M Take 20 mEq by mouth daily.   Vitamin D  50 MCG (2000 UT) tablet Take 2,000 Units by mouth daily.        Review of Systems  Constitutional:  Positive for fatigue. Negative for appetite change and fever.  HENT:  Positive for  hearing loss. Negative for congestion and trouble swallowing.   Eyes:  Negative for visual disturbance.  Respiratory:  Negative for cough and wheezing.   Cardiovascular:  Positive for  leg swelling.  Gastrointestinal:  Negative for abdominal pain and constipation.       GERD  Genitourinary:  Positive for frequency. Negative for dysuria and urgency.  Musculoskeletal:  Positive for arthralgias and gait problem.       Knee pain, mostly in the left, ambulates with walker  Skin:  Positive for pallor.  Neurological:  Negative for speech difficulty, weakness and light-headedness.       Memory lapses.   Psychiatric/Behavioral:  Negative for behavioral problems and sleep disturbance. The patient is not nervous/anxious.     Immunization History  Administered Date(s) Administered   Fluad Quad(high Dose 65+) 10/05/2019, 10/21/2022   INFLUENZA, HIGH DOSE SEASONAL PF 09/21/2014, 08/27/2016, 08/27/2017, 09/23/2018   Influenza Split 12/10/2011, 09/29/2012   Influenza,inj,Quad PF,6+ Mos 10/08/2013, 09/12/2015   Influenza-Unspecified 10/27/2020, 10/18/2021, 10/01/2023   Moderna Covid-19 Vaccine  Bivalent Booster 49yrs & up 10/15/2023   Moderna Sars-Covid-2 Vaccination 11/07/2020, 05/16/2022   PFIZER(Purple Top)SARS-COV-2 Vaccination 02/28/2020, 03/29/2020, 11/07/2020, 05/29/2021, 09/18/2021   Pfizer Covid-19 Vaccine Bivalent Booster 84yrs & up 10/31/2022   Pneumococcal Conjugate-13 08/22/2015   Pneumococcal Polysaccharide-23 11/23/2008   Td 03/23/2004   Tdap 07/12/2015   Zoster Recombinant(Shingrix) 01/01/2022, 03/26/2022   Pertinent  Health Maintenance Due  Topic Date Due   Influenza Vaccine  07/30/2024   Bone Density Scan  Completed      10/13/2020    1:00 AM 10/13/2020    8:15 AM 04/08/2021    2:44 PM 01/07/2023    9:34 AM 06/18/2023    9:16 AM  Fall Risk  Falls in the past year?    0 1  Was there an injury with Fall?    0  0   Fall Risk Category Calculator    0 1  Fall Risk Category  (Retired)    Low    (RETIRED) Patient Fall Risk Level High fall risk  High fall risk  High fall risk  Low fall risk    Patient at Risk for Falls Due to    No Fall Risks History of fall(s);Impaired balance/gait;Impaired mobility;Impaired vision  Fall risk Follow up    Falls evaluation completed  Falls evaluation completed  Fall risk Follow up - Comments     High Fall Risk     Data saved with a previous flowsheet row definition   Functional Status Survey:    Vitals:   12/13/24 1536  BP: 127/67  Pulse: 74  Resp: 19  Temp: 97.6 F (36.4 C)  SpO2: 96%  Weight: 122 lb 9.6 oz (55.6 kg)   Body mass index is 21.04 kg/m. Physical Exam Vitals and nursing note reviewed.  Constitutional:      Appearance: Normal appearance.  HENT:     Head: Normocephalic and atraumatic.     Nose: Nose normal.     Mouth/Throat:     Mouth: Mucous membranes are moist.  Eyes:     Extraocular Movements: Extraocular movements intact.     Conjunctiva/sclera: Conjunctivae normal.     Pupils: Pupils are equal, round, and reactive to light.  Cardiovascular:     Rate and Rhythm: Normal rate.     Heart sounds: No murmur heard. Pulmonary:     Effort: Pulmonary effort is normal.     Breath sounds: No rales.  Abdominal:     General: Bowel sounds are normal.     Palpations: Abdomen is soft.     Tenderness: There is no abdominal tenderness.  Musculoskeletal:     Cervical back: Normal range  of motion and neck supple.     Right lower leg: No edema.     Left lower leg: Edema present.     Comments: Trace edema LLE Medial left knee is enlarged, slightly warmer than the right, no redness, ballottement, or reduced ROM.  Skin:    General: Skin is warm and dry.     Coloration: Skin is pale.  Neurological:     General: No focal deficit present.     Mental Status: She is alert and oriented to person, place, and time. Mental status is at baseline.     Gait: Gait abnormal.  Psychiatric:        Mood and Affect: Mood  normal.        Behavior: Behavior normal.        Thought Content: Thought content normal.        Judgment: Judgment normal.     Comments: Admitted feeling depressed or sad at times, but declined antidepressant, stated she sleeps well, has supportive family, is happy at [facility].      Labs reviewed: No results for input(s): NA, K, CL, CO2, GLUCOSE, BUN, CREATININE, CALCIUM, MG, PHOS in the last 8760 hours. No results for input(s): AST, ALT, ALKPHOS, BILITOT, PROT, ALBUMIN in the last 8760 hours. No results for input(s): WBC, NEUTROABS, HGB, HCT, MCV, PLT in the last 8760 hours. Lab Results  Component Value Date   TSH 1.53 08/22/2022   Lab Results  Component Value Date   HGBA1C 5.5 07/12/2015   Lab Results  Component Value Date   CHOL 211 (A) 08/22/2022   HDL 27 (A) 08/22/2022   LDLCALC 114 08/22/2022   LDLDIRECT 114.7 06/24/2013   TRIG 110 08/22/2022   CHOLHDL 3 06/24/2013    Significant Diagnostic Results in last 30 days:  No results found.  Assessment/Plan  Mild cognitive impairment Gradually decline Will update CBC/differential, CMP/eGFR, TSH, HgbA1c, lipids, vitamin B12, vitamin D   Slow transit constipation Stable  takes MiraLax    Anxiety and depression  lifelong issue, but sleeps/eats at her baseline. TSH 1.53 08/22/22  Esophageal reflux stable, on Omeprazole.   Venous (peripheral) insufficiency  peripheral edema is mild left foot, on Furosemide . Cardiology f/u  Essential hypertension  blood pressure is controlled, on amlodipine  Metoprolol , Bun/creat 13/0.5 01/03/23  HLD (hyperlipidemia) not on statin  Vitamin D  deficiency Vit D level 23 01/03/23, started Vit D 2000u qd.   Osteoarthritis, multiple sites S/p left bipolar hemiarthroplasty 10/12/20 for left hip fracture. Chronic left leg/knee pain, 07/02/23 X-ray left knee severe OA takes Tylenol .    Family/ staff Communication: Plan of care reviewed with the  patient and charge nurse  Labs/tests ordered: CBC/differential, CMP/eGFR, TSH, HgbA1c, lipids, vitamin B12, vitamin D       [1]  Allergies Allergen Reactions   Amitid  [Amitriptyline  Hcl]     Patient experienced a dizzy, spinning feeling    Metronidazole     REACTION: throat swells   Penicillins     REACTION: rash & fever   Alendronate Sodium     REACTION: unspecified   Amoxicillin Nausea And Vomiting   Clarithromycin     GI intolerance   Flonase  [Fluticasone  Propionate]     Slight Headache    Promethazine Hcl     REACTION: unspecified   Robitussin (Alcohol Free) [Guaifenesin] Other (See Comments)    unknown hyperactive   Spironolactone      09/21/13 weakness in legs   Singulair  [Montelukast  Sodium]     spacey; ineffective

## 2024-12-13 NOTE — Assessment & Plan Note (Signed)
peripheral edema is mild left foot, on Furosemide. Cardiology f/u

## 2024-12-13 NOTE — Assessment & Plan Note (Signed)
 S/p left bipolar hemiarthroplasty 10/12/20 for left hip fracture. Chronic left leg/knee pain, 07/02/23 X-ray left knee severe OA takes Tylenol .

## 2024-12-13 NOTE — Assessment & Plan Note (Signed)
Vit D level 23 01/03/23, started Vit D 2000u qd.  

## 2024-12-13 NOTE — Assessment & Plan Note (Signed)
 Gradually decline Will update CBC/differential, CMP/eGFR, TSH, HgbA1c, lipids, vitamin B12, vitamin D 

## 2024-12-13 NOTE — Assessment & Plan Note (Signed)
-  not on statin

## 2024-12-13 NOTE — Assessment & Plan Note (Signed)
 Hgb 12.4 01/03/23 No signs symptoms of bleeding Will update CBC/differential, vitamin B12

## 2024-12-14 ENCOUNTER — Encounter: Payer: Self-pay | Admitting: Nurse Practitioner

## 2024-12-14 DIAGNOSIS — D72829 Elevated white blood cell count, unspecified: Secondary | ICD-10-CM | POA: Insufficient documentation

## 2024-12-20 ENCOUNTER — Encounter: Payer: Self-pay | Admitting: Nurse Practitioner

## 2024-12-20 DIAGNOSIS — N390 Urinary tract infection, site not specified: Secondary | ICD-10-CM | POA: Insufficient documentation
